# Patient Record
Sex: Female | Born: 1935 | Race: White | Hispanic: No | Marital: Married | Smoking: Former smoker
Health system: Southern US, Community
[De-identification: ages and names within clinical notes are randomized; demographics above are authoritative.]

## PROBLEM LIST (undated history)

## (undated) DIAGNOSIS — J449 Chronic obstructive pulmonary disease, unspecified: Secondary | ICD-10-CM

## (undated) DIAGNOSIS — R001 Bradycardia, unspecified: Secondary | ICD-10-CM

## (undated) DIAGNOSIS — I679 Cerebrovascular disease, unspecified: Secondary | ICD-10-CM

## (undated) DIAGNOSIS — I2510 Atherosclerotic heart disease of native coronary artery without angina pectoris: Secondary | ICD-10-CM

## (undated) DIAGNOSIS — G8929 Other chronic pain: Secondary | ICD-10-CM

## (undated) DIAGNOSIS — E7800 Pure hypercholesterolemia, unspecified: Secondary | ICD-10-CM

## (undated) DIAGNOSIS — I639 Cerebral infarction, unspecified: Secondary | ICD-10-CM

## (undated) DIAGNOSIS — R55 Syncope and collapse: Secondary | ICD-10-CM

## (undated) DIAGNOSIS — I10 Essential (primary) hypertension: Secondary | ICD-10-CM

## (undated) DIAGNOSIS — J45909 Unspecified asthma, uncomplicated: Secondary | ICD-10-CM

## (undated) DIAGNOSIS — K219 Gastro-esophageal reflux disease without esophagitis: Secondary | ICD-10-CM

## (undated) DIAGNOSIS — R51 Headache: Secondary | ICD-10-CM

## (undated) DIAGNOSIS — Z720 Tobacco use: Secondary | ICD-10-CM

## (undated) DIAGNOSIS — J189 Pneumonia, unspecified organism: Secondary | ICD-10-CM

## (undated) DIAGNOSIS — D649 Anemia, unspecified: Secondary | ICD-10-CM

## (undated) DIAGNOSIS — F0390 Unspecified dementia without behavioral disturbance: Secondary | ICD-10-CM

## (undated) DIAGNOSIS — M8580 Other specified disorders of bone density and structure, unspecified site: Secondary | ICD-10-CM

## (undated) DIAGNOSIS — K5790 Diverticulosis of intestine, part unspecified, without perforation or abscess without bleeding: Secondary | ICD-10-CM

## (undated) DIAGNOSIS — M1990 Unspecified osteoarthritis, unspecified site: Secondary | ICD-10-CM

## (undated) DIAGNOSIS — R42 Dizziness and giddiness: Secondary | ICD-10-CM

## (undated) DIAGNOSIS — I951 Orthostatic hypotension: Secondary | ICD-10-CM

## (undated) DIAGNOSIS — N390 Urinary tract infection, site not specified: Secondary | ICD-10-CM

## (undated) DIAGNOSIS — J42 Unspecified chronic bronchitis: Secondary | ICD-10-CM

## (undated) DIAGNOSIS — F99 Mental disorder, not otherwise specified: Secondary | ICD-10-CM

## (undated) DIAGNOSIS — I493 Ventricular premature depolarization: Secondary | ICD-10-CM

## (undated) DIAGNOSIS — M549 Dorsalgia, unspecified: Secondary | ICD-10-CM

## (undated) DIAGNOSIS — Z8489 Family history of other specified conditions: Secondary | ICD-10-CM

## (undated) HISTORY — DX: Cerebrovascular disease, unspecified: I67.9

## (undated) HISTORY — DX: Bradycardia, unspecified: R00.1

## (undated) HISTORY — PX: APPENDECTOMY: SHX54

## (undated) HISTORY — DX: Diverticulosis of intestine, part unspecified, without perforation or abscess without bleeding: K57.90

## (undated) HISTORY — PX: TONSILLECTOMY: SUR1361

## (undated) HISTORY — PX: VENTRAL HERNIA REPAIR: SHX424

## (undated) HISTORY — DX: Gastro-esophageal reflux disease without esophagitis: K21.9

## (undated) HISTORY — DX: Other specified disorders of bone density and structure, unspecified site: M85.80

## (undated) HISTORY — PX: CATARACT EXTRACTION, BILATERAL: SHX1313

## (undated) HISTORY — DX: Tobacco use: Z72.0

## (undated) HISTORY — DX: Anemia, unspecified: D64.9

## (undated) HISTORY — DX: Orthostatic hypotension: I95.1

## (undated) HISTORY — PX: HERNIA REPAIR: SHX51

## (undated) HISTORY — DX: Ventricular premature depolarization: I49.3

## (undated) HISTORY — DX: Syncope and collapse: R55

---

## 1988-01-08 HISTORY — PX: ABDOMINAL HYSTERECTOMY: SHX81

## 1999-01-22 ENCOUNTER — Other Ambulatory Visit: Admission: RE | Admit: 1999-01-22 | Discharge: 1999-01-22 | Payer: Self-pay | Admitting: Otolaryngology

## 2001-02-23 ENCOUNTER — Encounter: Payer: Self-pay | Admitting: Otolaryngology

## 2001-02-23 ENCOUNTER — Ambulatory Visit (HOSPITAL_COMMUNITY): Admission: RE | Admit: 2001-02-23 | Discharge: 2001-02-23 | Payer: Self-pay | Admitting: Otolaryngology

## 2002-02-08 ENCOUNTER — Ambulatory Visit (HOSPITAL_COMMUNITY): Admission: RE | Admit: 2002-02-08 | Discharge: 2002-02-08 | Payer: Self-pay | Admitting: Internal Medicine

## 2002-04-06 ENCOUNTER — Ambulatory Visit (HOSPITAL_COMMUNITY): Admission: RE | Admit: 2002-04-06 | Discharge: 2002-04-06 | Payer: Self-pay | Admitting: Family Medicine

## 2002-04-06 ENCOUNTER — Encounter: Payer: Self-pay | Admitting: Family Medicine

## 2002-04-07 ENCOUNTER — Encounter: Payer: Self-pay | Admitting: Family Medicine

## 2002-04-07 ENCOUNTER — Ambulatory Visit (HOSPITAL_COMMUNITY): Admission: RE | Admit: 2002-04-07 | Discharge: 2002-04-07 | Payer: Self-pay | Admitting: Family Medicine

## 2002-04-28 ENCOUNTER — Encounter (HOSPITAL_COMMUNITY): Admission: RE | Admit: 2002-04-28 | Discharge: 2002-05-28 | Payer: Self-pay | Admitting: Family Medicine

## 2002-10-25 ENCOUNTER — Encounter: Payer: Self-pay | Admitting: Family Medicine

## 2002-10-25 ENCOUNTER — Ambulatory Visit (HOSPITAL_COMMUNITY): Admission: RE | Admit: 2002-10-25 | Discharge: 2002-10-25 | Payer: Self-pay | Admitting: Family Medicine

## 2003-04-23 ENCOUNTER — Ambulatory Visit (HOSPITAL_COMMUNITY): Admission: RE | Admit: 2003-04-23 | Discharge: 2003-04-23 | Payer: Self-pay | Admitting: Family Medicine

## 2003-05-17 ENCOUNTER — Ambulatory Visit (HOSPITAL_COMMUNITY): Admission: RE | Admit: 2003-05-17 | Discharge: 2003-05-17 | Payer: Self-pay

## 2003-11-16 ENCOUNTER — Ambulatory Visit (HOSPITAL_COMMUNITY): Admission: RE | Admit: 2003-11-16 | Discharge: 2003-11-16 | Payer: Self-pay | Admitting: Otolaryngology

## 2003-12-28 ENCOUNTER — Ambulatory Visit (HOSPITAL_COMMUNITY): Admission: RE | Admit: 2003-12-28 | Discharge: 2003-12-28 | Payer: Self-pay | Admitting: Family Medicine

## 2003-12-28 ENCOUNTER — Encounter (HOSPITAL_COMMUNITY): Admission: RE | Admit: 2003-12-28 | Discharge: 2004-01-27 | Payer: Self-pay | Admitting: Dentistry

## 2004-04-23 ENCOUNTER — Ambulatory Visit: Payer: Self-pay | Admitting: Internal Medicine

## 2004-04-26 ENCOUNTER — Ambulatory Visit: Payer: Self-pay | Admitting: Internal Medicine

## 2004-06-12 ENCOUNTER — Ambulatory Visit: Payer: Self-pay | Admitting: Internal Medicine

## 2004-07-03 ENCOUNTER — Ambulatory Visit (HOSPITAL_COMMUNITY): Admission: RE | Admit: 2004-07-03 | Discharge: 2004-07-03 | Payer: Self-pay | Admitting: Obstetrics and Gynecology

## 2004-07-11 ENCOUNTER — Ambulatory Visit: Payer: Self-pay | Admitting: Internal Medicine

## 2004-07-17 ENCOUNTER — Ambulatory Visit (HOSPITAL_COMMUNITY): Admission: RE | Admit: 2004-07-17 | Discharge: 2004-07-17 | Payer: Self-pay | Admitting: Family Medicine

## 2004-09-12 ENCOUNTER — Ambulatory Visit (HOSPITAL_COMMUNITY): Admission: RE | Admit: 2004-09-12 | Discharge: 2004-09-12 | Payer: Self-pay | Admitting: Neurology

## 2005-01-07 HISTORY — PX: CORONARY ARTERY BYPASS GRAFT: SHX141

## 2005-01-07 HISTORY — PX: CARDIAC CATHETERIZATION: SHX172

## 2005-01-22 ENCOUNTER — Ambulatory Visit: Payer: Self-pay | Admitting: Internal Medicine

## 2005-02-12 ENCOUNTER — Ambulatory Visit: Payer: Self-pay | Admitting: Internal Medicine

## 2005-04-24 ENCOUNTER — Ambulatory Visit: Payer: Self-pay | Admitting: Internal Medicine

## 2005-05-31 ENCOUNTER — Encounter (INDEPENDENT_AMBULATORY_CARE_PROVIDER_SITE_OTHER): Payer: Self-pay | Admitting: *Deleted

## 2005-05-31 LAB — CONVERTED CEMR LAB
ALT: 11 units/L
Albumin: 4.1 g/dL
Glucose, Bld: 84 mg/dL
HDL: 53 mg/dL
Hemoglobin: 11.5 g/dL
MCV: 97.2 fL
Potassium: 4 meq/L
Sodium: 140 meq/L
Total Protein: 6.6 g/dL
WBC: 6 10*3/uL

## 2005-06-07 ENCOUNTER — Ambulatory Visit: Payer: Self-pay | Admitting: Cardiology

## 2005-06-13 ENCOUNTER — Ambulatory Visit: Payer: Self-pay | Admitting: *Deleted

## 2005-06-13 ENCOUNTER — Encounter (HOSPITAL_COMMUNITY): Admission: RE | Admit: 2005-06-13 | Discharge: 2005-07-13 | Payer: Self-pay | Admitting: *Deleted

## 2005-06-18 ENCOUNTER — Ambulatory Visit (HOSPITAL_COMMUNITY): Admission: RE | Admit: 2005-06-18 | Discharge: 2005-06-18 | Payer: Self-pay | Admitting: *Deleted

## 2005-06-18 ENCOUNTER — Ambulatory Visit: Payer: Self-pay | Admitting: *Deleted

## 2005-06-21 ENCOUNTER — Inpatient Hospital Stay (HOSPITAL_COMMUNITY): Admission: AD | Admit: 2005-06-21 | Discharge: 2005-07-09 | Payer: Self-pay | Admitting: *Deleted

## 2005-06-21 ENCOUNTER — Ambulatory Visit: Payer: Self-pay | Admitting: *Deleted

## 2005-06-21 ENCOUNTER — Inpatient Hospital Stay (HOSPITAL_BASED_OUTPATIENT_CLINIC_OR_DEPARTMENT_OTHER): Admission: RE | Admit: 2005-06-21 | Discharge: 2005-06-21 | Payer: Self-pay | Admitting: *Deleted

## 2005-06-24 ENCOUNTER — Encounter: Payer: Self-pay | Admitting: Vascular Surgery

## 2005-06-24 ENCOUNTER — Encounter: Payer: Self-pay | Admitting: Internal Medicine

## 2005-07-03 ENCOUNTER — Encounter: Payer: Self-pay | Admitting: Internal Medicine

## 2005-07-04 ENCOUNTER — Ambulatory Visit: Payer: Self-pay | Admitting: Physical Medicine & Rehabilitation

## 2005-07-05 ENCOUNTER — Ambulatory Visit: Payer: Self-pay | Admitting: Gastroenterology

## 2005-07-05 ENCOUNTER — Ambulatory Visit: Payer: Self-pay | Admitting: Thoracic Surgery (Cardiothoracic Vascular Surgery)

## 2005-07-09 ENCOUNTER — Inpatient Hospital Stay: Admission: AD | Admit: 2005-07-09 | Discharge: 2005-07-11 | Payer: Self-pay | Admitting: Internal Medicine

## 2005-07-11 ENCOUNTER — Ambulatory Visit: Payer: Self-pay | Admitting: Internal Medicine

## 2005-07-12 ENCOUNTER — Ambulatory Visit: Payer: Self-pay | Admitting: Internal Medicine

## 2005-07-15 ENCOUNTER — Ambulatory Visit (HOSPITAL_COMMUNITY): Admission: RE | Admit: 2005-07-15 | Discharge: 2005-07-15 | Payer: Self-pay | Admitting: Internal Medicine

## 2005-07-15 ENCOUNTER — Ambulatory Visit: Payer: Self-pay | Admitting: Internal Medicine

## 2005-07-18 ENCOUNTER — Ambulatory Visit: Payer: Self-pay | Admitting: Internal Medicine

## 2005-07-18 ENCOUNTER — Ambulatory Visit (HOSPITAL_COMMUNITY): Admission: RE | Admit: 2005-07-18 | Discharge: 2005-07-18 | Payer: Self-pay | Admitting: Internal Medicine

## 2005-08-06 ENCOUNTER — Encounter (INDEPENDENT_AMBULATORY_CARE_PROVIDER_SITE_OTHER): Payer: Self-pay | Admitting: Internal Medicine

## 2005-08-08 ENCOUNTER — Encounter (HOSPITAL_COMMUNITY): Admission: RE | Admit: 2005-08-08 | Discharge: 2005-09-07 | Payer: Self-pay | Admitting: *Deleted

## 2005-09-02 ENCOUNTER — Ambulatory Visit (HOSPITAL_COMMUNITY): Admission: RE | Admit: 2005-09-02 | Discharge: 2005-09-02 | Payer: Self-pay | Admitting: *Deleted

## 2005-09-02 ENCOUNTER — Ambulatory Visit: Payer: Self-pay | Admitting: *Deleted

## 2005-09-11 ENCOUNTER — Encounter (HOSPITAL_COMMUNITY): Admission: RE | Admit: 2005-09-11 | Discharge: 2005-10-05 | Payer: Self-pay | Admitting: *Deleted

## 2005-10-07 ENCOUNTER — Encounter (HOSPITAL_COMMUNITY): Admission: RE | Admit: 2005-10-07 | Discharge: 2005-11-06 | Payer: Self-pay | Admitting: *Deleted

## 2005-11-08 ENCOUNTER — Ambulatory Visit: Payer: Self-pay | Admitting: Cardiovascular Disease

## 2005-11-08 ENCOUNTER — Encounter (HOSPITAL_COMMUNITY): Admission: RE | Admit: 2005-11-08 | Discharge: 2005-12-08 | Payer: Self-pay

## 2005-12-19 ENCOUNTER — Ambulatory Visit: Payer: Self-pay | Admitting: Internal Medicine

## 2005-12-26 ENCOUNTER — Ambulatory Visit (HOSPITAL_COMMUNITY): Admission: RE | Admit: 2005-12-26 | Discharge: 2005-12-26 | Payer: Self-pay | Admitting: Family Medicine

## 2006-01-06 ENCOUNTER — Ambulatory Visit: Payer: Self-pay | Admitting: Internal Medicine

## 2006-02-13 ENCOUNTER — Ambulatory Visit: Payer: Self-pay | Admitting: Internal Medicine

## 2006-02-18 ENCOUNTER — Ambulatory Visit (HOSPITAL_COMMUNITY): Admission: RE | Admit: 2006-02-18 | Discharge: 2006-02-18 | Payer: Self-pay | Admitting: Family Medicine

## 2006-03-13 ENCOUNTER — Ambulatory Visit: Payer: Self-pay | Admitting: Internal Medicine

## 2006-04-14 ENCOUNTER — Ambulatory Visit: Payer: Self-pay | Admitting: Internal Medicine

## 2006-05-13 ENCOUNTER — Emergency Department (HOSPITAL_COMMUNITY): Admission: EM | Admit: 2006-05-13 | Discharge: 2006-05-13 | Payer: Self-pay | Admitting: Emergency Medicine

## 2006-12-30 ENCOUNTER — Emergency Department (HOSPITAL_COMMUNITY): Admission: EM | Admit: 2006-12-30 | Discharge: 2006-12-30 | Payer: Self-pay | Admitting: Emergency Medicine

## 2007-01-06 ENCOUNTER — Emergency Department (HOSPITAL_COMMUNITY): Admission: EM | Admit: 2007-01-06 | Discharge: 2007-01-06 | Payer: Self-pay | Admitting: Emergency Medicine

## 2007-01-15 ENCOUNTER — Ambulatory Visit (HOSPITAL_COMMUNITY): Admission: RE | Admit: 2007-01-15 | Discharge: 2007-01-15 | Payer: Self-pay | Admitting: Family Medicine

## 2008-08-29 ENCOUNTER — Ambulatory Visit: Payer: Self-pay | Admitting: Cardiology

## 2008-08-29 ENCOUNTER — Observation Stay (HOSPITAL_COMMUNITY): Admission: EM | Admit: 2008-08-29 | Discharge: 2008-08-31 | Payer: Self-pay | Admitting: Emergency Medicine

## 2008-08-30 ENCOUNTER — Encounter (INDEPENDENT_AMBULATORY_CARE_PROVIDER_SITE_OTHER): Payer: Self-pay | Admitting: Internal Medicine

## 2008-08-30 ENCOUNTER — Encounter: Payer: Self-pay | Admitting: Cardiology

## 2008-08-31 ENCOUNTER — Encounter: Payer: Self-pay | Admitting: Cardiology

## 2008-09-02 ENCOUNTER — Telehealth: Payer: Self-pay | Admitting: Cardiology

## 2008-09-15 ENCOUNTER — Encounter: Payer: Self-pay | Admitting: Cardiology

## 2008-09-19 ENCOUNTER — Encounter (INDEPENDENT_AMBULATORY_CARE_PROVIDER_SITE_OTHER): Payer: Self-pay | Admitting: *Deleted

## 2008-09-19 DIAGNOSIS — D649 Anemia, unspecified: Secondary | ICD-10-CM | POA: Insufficient documentation

## 2008-09-19 DIAGNOSIS — M8580 Other specified disorders of bone density and structure, unspecified site: Secondary | ICD-10-CM

## 2008-09-19 DIAGNOSIS — K219 Gastro-esophageal reflux disease without esophagitis: Secondary | ICD-10-CM

## 2008-09-20 ENCOUNTER — Ambulatory Visit: Payer: Self-pay | Admitting: Cardiology

## 2008-10-11 ENCOUNTER — Telehealth (INDEPENDENT_AMBULATORY_CARE_PROVIDER_SITE_OTHER): Payer: Self-pay | Admitting: *Deleted

## 2008-10-17 ENCOUNTER — Ambulatory Visit: Payer: Self-pay | Admitting: Cardiology

## 2008-10-27 ENCOUNTER — Encounter: Payer: Self-pay | Admitting: Cardiology

## 2008-12-06 ENCOUNTER — Encounter: Payer: Self-pay | Admitting: Cardiology

## 2009-05-05 ENCOUNTER — Encounter (INDEPENDENT_AMBULATORY_CARE_PROVIDER_SITE_OTHER): Payer: Self-pay | Admitting: *Deleted

## 2009-05-07 DIAGNOSIS — K5730 Diverticulosis of large intestine without perforation or abscess without bleeding: Secondary | ICD-10-CM | POA: Insufficient documentation

## 2009-05-07 DIAGNOSIS — I951 Orthostatic hypotension: Secondary | ICD-10-CM | POA: Insufficient documentation

## 2009-05-07 DIAGNOSIS — E785 Hyperlipidemia, unspecified: Secondary | ICD-10-CM

## 2009-05-07 DIAGNOSIS — Z720 Tobacco use: Secondary | ICD-10-CM | POA: Insufficient documentation

## 2009-05-08 ENCOUNTER — Ambulatory Visit: Payer: Self-pay | Admitting: Cardiology

## 2009-05-08 ENCOUNTER — Encounter (INDEPENDENT_AMBULATORY_CARE_PROVIDER_SITE_OTHER): Payer: Self-pay | Admitting: *Deleted

## 2009-05-15 ENCOUNTER — Encounter: Payer: Self-pay | Admitting: Cardiology

## 2009-05-15 LAB — CONVERTED CEMR LAB
Albumin: 4.2 g/dL (ref 3.5–5.2)
BUN: 11 mg/dL (ref 6–23)
CO2: 25 meq/L (ref 19–32)
Chloride: 106 meq/L (ref 96–112)
Creatinine, Ser: 0.74 mg/dL (ref 0.40–1.20)
Glucose, Bld: 77 mg/dL (ref 70–99)
HDL: 53 mg/dL (ref >39)
Potassium: 4.9 meq/L (ref 3.5–5.3)
Total Bilirubin: 0.5 mg/dL (ref 0.3–1.2)
Total Protein: 6 g/dL (ref 6.0–8.3)
Triglycerides: 62 mg/dL (ref <150)
VLDL: 12 mg/dL (ref 0–40)

## 2009-06-23 ENCOUNTER — Encounter (INDEPENDENT_AMBULATORY_CARE_PROVIDER_SITE_OTHER): Payer: Self-pay | Admitting: *Deleted

## 2010-01-28 ENCOUNTER — Encounter: Payer: Self-pay | Admitting: Family Medicine

## 2010-01-28 ENCOUNTER — Encounter: Payer: Self-pay | Admitting: Cardiology

## 2010-02-08 NOTE — Letter (Signed)
Summary: [place] Results Engineer, agricultural at [facility]  [address] [place] [address]   Phone: [phone]  Fax: [phone]      June 23, 2009 MRN: [id number]   [facility] [address] [place], [place]  [address]   Dear Ms. Adela Lank,  Your test ordered by Selena Batten has been reviewed by your physician (or physician assistant) and was found to be normal or stable. Your physician (or physician assistant) felt no changes were needed at this time.  ____ Echocardiogram  ____ Cardiac Stress Test  __x__ Lab Work  ____ Peripheral vascular study of arms, legs or neck  ____ CT scan or X-ray  ____ Lung or Breathing test  ____ Other:  No change in medical treatment at this time, per Dr. Dietrich Pates.  Enclosed is a copy of your labwork for your records.  Thank you, Arlander Gillen Allyne Gee RN    [place] Bing, MD, Lenise Arena.C.Gaylord Shih, MD, F.A.C.C Lewayne Bunting, MD, F.A.C.C Nona Dell, MD, F.A.C.C Charlton Haws, MD, Lenise Arena.C.C

## 2010-02-08 NOTE — Letter (Signed)
Summary: [place] Future Lab Work Engineer, agricultural at [employer]  [address], [place] [address]   Phone: [phone]  Fax: [phone]     May 08, 2009 MRN: [id number]   [facility] [address] [place], [place]  [address]      YOUR LAB WORK IS DUE   ___________MONDAY   MAY 6, 2011______________________________  Please go to [facility], located across the street from [facility] on the second floor.  Hours are Monday - Friday 7am until 7:30pm         Saturday 8am until 12noon    _X_  DO NOT EAT OR DRINK AFTER MIDNIGHT EVENING PRIOR TO LABWORK  __ YOUR LABWORK IS NOT FASTING --YOU MAY EAT PRIOR TO LABWORK

## 2010-02-08 NOTE — Assessment & Plan Note (Signed)
Summary: 6 MTH F/U PER CHECKOUT ON 09/20/08/TG   Visit Type:  Follow-up Primary Provider:  Dr.Mark Nobie Putnam  CC:  .Marland Kitchen  History of Present Illness: Return visit as scheduled for this very pleasant octogenarian with coronary artery disease, now 4 years following CABG surgery for left main disease.  Since a hospitalization last year for chest discomfort, she has been free of symptoms.  She is active for her age, and has no exercise-related chest discomfort or dyspnea.  She has had no recent health issues.  She does not recall her last lipid profile; the most recent test available to me is from 2008.  Preventive Screening-Counseling & Management  Alcohol-Tobacco     Smoking Status: quit     Year Quit: 2005  Current Medications (verified): 1)  Caltrate 600+d Plus 600-400 Mg-Unit Tabs (Calcium Carbonate-Vit D-Min) .... Take 1 Tablet By Mouth Two Times A Day 2)  Vitamin C Cr 500 Mg Cr-Caps (Ascorbic Acid) .... Take 1 Tab Daily 3)  Aspir-Low 81 Mg Tbec (Aspirin) .... Take 1 Tab Daily 4)  Vitamin B-12 250 Mcg Tabs (Cyanocobalamin) .... Take 1 Tablet By Mouth Once A Day 5)  Ginkoba 40 Mg Tabs (Ginkgo Biloba) .... Take 1 Tab Daily 6)  Lipitor 10 Mg Tabs (Atorvastatin Calcium) .... Take 1 Tab Daily 7)  Fish Oil 1000 Mg Caps (Omega-3 Fatty Acids) .... Take 1 Cap Daily 8)  Daily Multiple Vitamins  Tabs (Multiple Vitamin) .... Take 1 Tab Daily 9)  Boniva 150 Mg Tabs (Ibandronate Sodium) .... Take 1 Tab Monthly 10)  Nitrostat 0.4 Mg Subl (Nitroglycerin) .... Take As Directed For Chest Pain 11)  Lorazepam 1 Mg Tabs (Lorazepam) .... Take 1/2-1 Tablet By Mouth Three Times A Day As Needed 12)  Mucinex Dm 30-600 Mg Xr12h-Tab (Dextromethorphan-Guaifenesin) .... Take 1 Tablet By Mouth Two Times A Day As Needed 13)  Tylenol Extra Strength 500 Mg Tabs (Acetaminophen) .... As Needed 14)  Meclizine Hcl 25 Mg Tabs (Meclizine Hcl) .... Take 1 Tablet By Mouth Three Times A Day As Needed 15)  Xopenex 0.63 Mg/57ml Nebu  (Levalbuterol Hcl) .... One Neb Tx As Needed  Allergies (verified): 1)  ! Penicillin 2)  ! * Sulfar 3)  ! Keflex  Past History:  PMH, FH, and Social History reviewed and updated.  Family History: Father:deceased; cause was trauma due to motor vehicle accident Mother:deceased; cardiac problems Siblings:3, all deceased; causes included pneumonia, cardiac problems and asthma.  Social History: Smoking Status:  quit  Review of Systems       The patient complains of weight loss.  The patient denies anorexia, vision loss, decreased hearing, hoarseness, chest pain, syncope, dyspnea on exertion, peripheral edema, prolonged cough, headaches, hemoptysis, abdominal pain, melena, and hematochezia.         Weight loss was intentional; she now feels that she is at an appropriate weight.  Vital Signs:  Patient profile:   77 year old female Height:      64 inches Weight:      105 pounds Pulse rate:   51 / minute BP sitting:   112 / 61  (left arm) Cuff size:   regular  Vitals Entered By: Carlye Grippe (May 08, 2009 1:15 PM) CC: .   Physical Exam  General:     Trim; well developed; no acute distress:   Neck-No JVD; faint left carotid bruits: Lungs-No tachypnea, no rales; no rhonchi; no wheezes: Cardiovascular-normal PMI; normal S1 and S2; grade 2/6 systolic murmur at the  left sternal border and apex Abdomen-BS normal; soft and non-tender without masses or organomegaly:  Musculoskeletal-No deformities, no cyanosis or clubbing: Neurologic-Normal cranial nerves; symmetric strength and tone:  Skin-Warm, no significant lesions: Extremities-Nl distal pulses; no edema:     Impression & Recommendations:  Problem # 1:  CAROTID BRUIT, LEFT (ICD-785.9) No reports of prior carotid ultrasound studies are available to me.  Accordingly, this study will be obtained to evaluate her bruit in the setting of known vascular disease.  Problem # 2:  ATHEROSCLEROTIC CARDIOVASCULAR DISEASE  (ICD-429.2) In the absence of symptoms, coronary disease appears stable.  Problem # 3:  HYPERTENSION, BENIGN (ICD-401.1) Blood pressure control is excellent.  Current medications will be continued.  Problem # 4:  HYPERLIPIDEMIA (ICD-272.4) A lipid profile and chemistry profile will be obtained.  I will reassess this nice woman in one year.  Other Orders: Carotid Duplex (Carotid Duplex) Future Orders: T-Lipid Profile ([id number]) ... 05/15/2009 T-Comprehensive Metabolic Panel [phone] [phone]) ... 05/15/2009  Patient Instructions: 1)  Your physician recommends that you schedule a follow-up appointment in: 1 year 2)  Your physician recommends that you return for lab work in: next week 3)  Your physician has requested that you have a carotid duplex. This test is an ultrasound of the carotid arteries in your neck. It looks at blood flow through these arteries that supply the brain with blood. Allow one hour for this exam. There are no restrictions or special instructions.

## 2010-02-08 NOTE — Miscellaneous (Signed)
Summary: LABS CBCD,CMP,LIPIDS,T4,TSH,05/31/2005  Clinical Lists Changes  Observations: Added new observation of CALCIUM: 9.4 mg/dL (16/10/9602 54:09) Added new observation of ALBUMIN: 4.1 g/dL (81/19/1478 29:56) Added new observation of PROTEIN, TOT: 6.6 g/dL (21/30/8657 84:69) Added new observation of SGPT (ALT): 11 units/L (05/31/2005 16:58) Added new observation of SGOT (AST): 14 units/L (05/31/2005 16:58) Added new observation of ALK PHOS: 53 units/L (05/31/2005 16:58) Added new observation of CREATININE: 0.9 mg/dL (62/95/2841 32:44) Added new observation of BUN: 10 mg/dL (01/09/7251 66:44) Added new observation of BG RANDOM: 84 mg/dL (03/47/4259 56:38) Added new observation of CO2 PLSM/SER: 25 meq/L (05/31/2005 16:58) Added new observation of CL SERUM: 106 meq/L (05/31/2005 16:58) Added new observation of K SERUM: 4.0 meq/L (05/31/2005 16:58) Added new observation of NA: 140 meq/L (05/31/2005 16:58) Added new observation of LDL: 116 mg/dL (75/64/3329 51:88) Added new observation of HDL: 53 mg/dL (41/66/0630 16:01) Added new observation of TRIGLYC TOT: 110 mg/dL (09/32/3557 32:20) Added new observation of CHOLESTEROL: 191 mg/dL (25/42/7062 37:62) Added new observation of PLATELETK/UL: 271 K/uL (05/31/2005 16:58) Added new observation of MCV: 97.2 fL (05/31/2005 16:58) Added new observation of HCT: 34.4 % (05/31/2005 16:58) Added new observation of HGB: 11.5 g/dL (83/15/1761 60:73) Added new observation of WBC COUNT: 6.0 10*3/microliter (05/31/2005 16:58) Added new observation of TSH: 2.884 microintl units/mL (05/31/2005 16:58) Added new observation of T4, FREE: 4.8 ng/dL (71/06/2692 85:46)

## 2010-04-14 LAB — BLOOD GAS, ARTERIAL
Bicarbonate: 23.4 mEq/L (ref 20.0–24.0)
O2 Saturation: 96.7 %
Patient temperature: 37
TCO2: 20.2 mmol/L (ref 0–100)

## 2010-04-14 LAB — POCT CARDIAC MARKERS
CKMB, poc: <1 ng/mL — ABNORMAL LOW (ref 1.0–8.0)
Myoglobin, poc: 29.4 ng/mL (ref 12–200)
Troponin i, poc: <0.05 ng/mL (ref 0.00–0.09)

## 2010-04-14 LAB — LIPID PANEL
Cholesterol: 133 mg/dL (ref 0–200)
HDL: 58 mg/dL (ref >39)
LDL Cholesterol: 66 mg/dL (ref 0–99)
Total CHOL/HDL Ratio: 2.3 RATIO
VLDL: 9 mg/dL (ref 0–40)

## 2010-04-14 LAB — BASIC METABOLIC PANEL
BUN: 8 mg/dL (ref 6–23)
CO2: 31 mEq/L (ref 19–32)
Calcium: 9 mg/dL (ref 8.4–10.5)
Chloride: 103 mEq/L (ref 96–112)
Chloride: 107 mEq/L (ref 96–112)
Creatinine, Ser: 0.54 mg/dL (ref 0.4–1.2)
GFR calc Af Amer: >60 mL/min (ref >60)
Glucose, Bld: 90 mg/dL (ref 70–99)
Glucose, Bld: 93 mg/dL (ref 70–99)
Potassium: 3.7 mEq/L (ref 3.5–5.1)
Sodium: 139 mEq/L (ref 135–145)

## 2010-04-14 LAB — CBC
Hemoglobin: 13.6 g/dL (ref 12.0–15.0)
MCHC: 34.4 g/dL (ref 30.0–36.0)
MCHC: 34.6 g/dL (ref 30.0–36.0)
MCV: 96.1 fL (ref 78.0–100.0)
Platelets: 210 10*3/uL (ref 150–400)
RBC: 4.11 MIL/uL (ref 3.87–5.11)
WBC: 9.1 10*3/uL (ref 4.0–10.5)
WBC: 9.3 10*3/uL (ref 4.0–10.5)

## 2010-04-14 LAB — DIFFERENTIAL
Eosinophils Absolute: 0.2 10*3/uL (ref 0.0–0.7)
Eosinophils Absolute: 0.2 10*3/uL (ref 0.0–0.7)
Eosinophils Relative: 2 % (ref 0–5)
Lymphocytes Relative: 14 % (ref 12–46)
Lymphs Abs: 1.6 10*3/uL (ref 0.7–4.0)
Monocytes Absolute: 0.6 10*3/uL (ref 0.1–1.0)
Monocytes Relative: 6 % (ref 3–12)
Neutro Abs: 6.9 10*3/uL (ref 1.7–7.7)
Neutrophils Relative %: 74 % (ref 43–77)
Neutrophils Relative %: 76 % (ref 43–77)

## 2010-04-14 LAB — CARDIAC PANEL(CRET KIN+CKTOT+MB+TROPI)
Relative Index: INVALID (ref 0.0–2.5)
Relative Index: INVALID (ref 0.0–2.5)

## 2010-04-14 LAB — BRAIN NATRIURETIC PEPTIDE: Pro B Natriuretic peptide (BNP): 139 pg/mL — ABNORMAL HIGH (ref 0.0–100.0)

## 2010-04-14 LAB — PROTIME-INR: Prothrombin Time: 13.8 seconds (ref 11.6–15.2)

## 2010-05-10 ENCOUNTER — Other Ambulatory Visit: Payer: Self-pay | Admitting: Cardiology

## 2010-05-10 ENCOUNTER — Encounter: Payer: Self-pay | Admitting: Cardiology

## 2010-05-10 ENCOUNTER — Ambulatory Visit (INDEPENDENT_AMBULATORY_CARE_PROVIDER_SITE_OTHER): Payer: MEDICARE | Admitting: Cardiology

## 2010-05-10 DIAGNOSIS — E785 Hyperlipidemia, unspecified: Secondary | ICD-10-CM

## 2010-05-10 DIAGNOSIS — R55 Syncope and collapse: Secondary | ICD-10-CM

## 2010-05-10 DIAGNOSIS — I679 Cerebrovascular disease, unspecified: Secondary | ICD-10-CM | POA: Insufficient documentation

## 2010-05-10 DIAGNOSIS — F17200 Nicotine dependence, unspecified, uncomplicated: Secondary | ICD-10-CM

## 2010-05-10 DIAGNOSIS — I951 Orthostatic hypotension: Secondary | ICD-10-CM

## 2010-05-10 DIAGNOSIS — I2510 Atherosclerotic heart disease of native coronary artery without angina pectoris: Secondary | ICD-10-CM

## 2010-05-10 LAB — COMPREHENSIVE METABOLIC PANEL
BUN: 14 mg/dL (ref 6–23)
CO2: 27 mEq/L (ref 19–32)
Calcium: 10.3 mg/dL (ref 8.4–10.5)
Chloride: 103 mEq/L (ref 96–112)
Creat: 0.85 mg/dL (ref 0.40–1.20)
Glucose, Bld: 86 mg/dL (ref 70–99)

## 2010-05-10 LAB — CBC
HCT: 45.8 % (ref 36.0–46.0)
Hemoglobin: 14.8 g/dL (ref 12.0–15.0)
RBC: 4.79 MIL/uL (ref 3.87–5.11)
RDW: 13.4 % (ref 11.5–15.5)
WBC: 6.7 10*3/uL (ref 4.0–10.5)

## 2010-05-10 NOTE — Patient Instructions (Signed)
Your physician recommends that you schedule a follow-up appointment in: 1 YEAR Your physician recommends that you return for lab work [id number] Your physician has requested that you regularly monitor and record your blood pressure readings at home. Please use the same machine at the same time of day to check your readings and record them to bring to your follow-up visit. HOME BLOOD PRESSURES LYING A FEW TIMES A WEEK CALL FOR VALUES >170

## 2010-05-10 NOTE — Assessment & Plan Note (Signed)
Multiple falls, at least one with significant facial injury; evaluated by Dr. Gerilyn Pilgrim and started on midodrine with improvement.  Although lying blood pressure is slightly higher than optimal at present, control of her orthostatic hypotension appears to be generally good.  Patient was cautioned about arising suddenly more about walking until she is sure that she will not become dizzy and about the need to immediately sit down or lie down when she does.  Family will monitor her supine blood pressure and call for any marked elevations.

## 2010-05-10 NOTE — Assessment & Plan Note (Signed)
Lipid profile last year was excellent.  A repeat study will be performed.

## 2010-05-10 NOTE — Assessment & Plan Note (Addendum)
Patient is 4 years out from CABG surgery with no evidence for ischemia at present.  Complete cessation of tobacco use would be desirable, but considering the patient's advanced age, minimal smoking may not be a significant issue.

## 2010-05-10 NOTE — Progress Notes (Signed)
HPI : [name] returns to the office as scheduled for continued assessment and treatment of coronary disease and cardiovascular risk factors.  Since her last visit, she developed lightheadedness and multiple falls, at least some of which appear to have represented true syncope.  She was evaluated by Dr. Gerilyn Pilgrim, who is treating her for orthostatic hypotension.  Since starting midodrine, symptoms are substantially improved, and she has had no recurrent falls.  She remains active with class II dyspnea on exertion and no chest discomfort.  Patient's family reports that she is smoking cigarettes on the sly.  Current Outpatient Prescriptions on File Prior to Visit  Medication Sig Dispense Refill  . acetaminophen (TYLENOL) 325 MG tablet Take 650 mg by mouth every 6 (six) hours as needed.        Marland Kitchen aspirin 81 MG tablet Take 81 mg by mouth daily.        Marland Kitchen atorvastatin (LIPITOR) 10 MG tablet Take 10 mg by mouth daily.        . Calcium-Vitamin D (CALTRATE 600 PLUS-VIT D PO) Take 1 tablet by mouth daily.        Marland Kitchen dextromethorphan-guaiFENesin (MUCINEX DM) 30-600 MG per 12 hr tablet Take 1 tablet by mouth every 12 (twelve) hours.        . Multiple Vitamins-Minerals (MULTIVITAMIN WITH MINERALS) tablet Take 1 tablet by mouth daily.        . Omega-3 Fatty Acids (FISH OIL) 1000 MG CAPS Take 2 capsules by mouth daily.       . vitamin B-12 (CYANOCOBALAMIN) 250 MCG tablet Take 250 mcg by mouth daily.        . nitroGLYCERIN (NITROSTAT) 0.4 MG SL tablet Place 0.4 mg under the tongue every 5 (five) minutes as needed.        Marland Kitchen DISCONTD: ascorbic Acid (VITAMIN C) 500 MG CPCR Take 500 mg by mouth daily.        Marland Kitchen DISCONTD: Ginkgo Biloba (GINKOBA) 40 MG TABS Take 1 tablet by mouth daily.        Marland Kitchen DISCONTD: ibandronate (BONIVA) 150 MG tablet Take 150 mg by mouth every 30 (thirty) days. Take in the morning with a full glass of water, on an empty stomach, and do not take anything else by mouth or lie down for the next 30 min.        Marland Kitchen DISCONTD: levalbuterol (XOPENEX) 0.63 MG/3ML nebulizer solution Take 1 ampule by nebulization every 4 (four) hours as needed.        Marland Kitchen DISCONTD: LORazepam (ATIVAN) 1 MG tablet Take 1 mg by mouth every 8 (eight) hours.        Marland Kitchen DISCONTD: meclizine (ANTIVERT) 25 MG tablet Take 25 mg by mouth 3 (three) times daily as needed.           Allergies  Allergen Reactions  . Cephalexin   . Iohexol     Urticaria, emesis, numbness during IVP performed at [facility] years ago  . Penicillins       Past medical history, social history, and family history reviewed and updated.  ROS: See history of present illness.  PHYSICAL EXAM: BP 136/91  Pulse 78  Wt 105 lb (47.628 kg)  SpO2 94%  General-Well developed; no acute distress Body habitus-proportionate weight and height Neck-No JVD; no carotid bruits Lungs-clear lung fields except for a few right basilar rales; resonant to percussion Cardiovascular-normal PMI; normal S1 and S2; prominent fourth heart sound Abdomen-normal bowel sounds; soft and non-tender without masses  or organomegaly Musculoskeletal-No deformities, no cyanosis or clubbing Neurologic-Normal cranial nerves; symmetric strength and tone Skin-Warm, no significant lesions Extremities-distal pulses intact with decreased dorsalis pedis pulses but normal posterior tibials; no edema  ASSESSMENT AND PLAN:

## 2010-05-10 NOTE — Assessment & Plan Note (Signed)
Patient failed to keep her appointment for carotid ultrasound last year.  She agrees to undergo this test in the near future.

## 2010-05-11 LAB — LIPID PANEL
HDL: 69 mg/dL (ref >39)
LDL Cholesterol: 86 mg/dL (ref 0–99)
Total CHOL/HDL Ratio: 2.5 Ratio
Triglycerides: 74 mg/dL (ref <150)
VLDL: 15 mg/dL (ref 0–40)

## 2010-05-14 ENCOUNTER — Encounter: Payer: Self-pay | Admitting: Cardiology

## 2010-05-15 ENCOUNTER — Encounter: Payer: Self-pay | Admitting: *Deleted

## 2010-05-15 ENCOUNTER — Telehealth: Payer: Self-pay | Admitting: *Deleted

## 2010-05-15 DIAGNOSIS — E875 Hyperkalemia: Secondary | ICD-10-CM

## 2010-05-15 NOTE — Progress Notes (Signed)
Information on a low potassium diet and labwork  Scheduled for 06/15/10

## 2010-05-15 NOTE — Telephone Encounter (Signed)
Message copied by Teressa Lower on Tue May 15, 2010  1:29 PM ------      Message from: [place] Bing      Created: Sun May 13, 2010 11:10 AM       Results reviewed and borderline hyperkalemia noted.  Patient is to be instructed in a low potassium diet and a basic metabolic profile to be obtained in one month.

## 2010-05-15 NOTE — Telephone Encounter (Signed)
Result letter mailed to pt with instructions.

## 2010-05-22 NOTE — Consult Note (Signed)
NAMEJAYLEE, [name]                ACCOUNT NO.:  [id number]   MEDICAL RECORD NO.:  [id number]          PATIENT TYPE:  OBV   LOCATION:  A332                          FACILITY:  [facility]   PHYSICIAN:  Kim Friends. Dietrich Pates, MD, FACCDATE OF BIRTH:  1925/05/13   DATE OF CONSULTATION:  08/30/2008  DATE OF DISCHARGE:                                 CONSULTATION   REFERRING:  [name]. Kim Ridgel, MD   PRIMARY CARE PHYSICIAN:  Kim Duel, MD   PRIMARY CARDIOLOGISTS:  [name]. Kim Emms, MD, [name] and Kim Friends.  Dietrich Pates, MD, [name]   HISTORY OF PRESENT ILLNESS:  An 77 year old woman who underwent CABG  surgery 3 years ago and now was admitted to hospital with chest  discomfort.  [name] was found to have left main disease in June 2007,  prompting two-vessel CABG surgery, which was uncomplicated.  She was  said to have moderate mitral and tricuspid regurgitation, but no valve  repair procedures were carried out.  She has a longstanding history of  cigarette smoking with mild-to-moderate chronic lung disease.  She also  has hyperlipidemia and hypertension.  Unfortunately, she has continued  to smoke cigarettes, but now indicates that she is willing to try  quitting.   The current episode began yesterday after the patient smoked a  cigarette.  She reported chest discomfort, which she now describes quite  vaguely.  This was of moderate intensity.  There was associated dyspnea,  but no diaphoresis nor nausea.  Symptoms resolved spontaneously by the  time she reached the emergency department.   PAST MEDICAL HISTORY:  Otherwise notable for neurologic events  characterized as either CVA or TIA in 1993.  She has seasonal allergies  and diverticular disease without a history of acute diverticulitis.   PAST SURGICAL HISTORY:  In addition to her bypass procedure include  bilateral cataract extraction, appendectomy, cholecystectomy, repair of  a ventral hernia, and hysterectomy.   ALLERGIES:  She reports  allergies to ADHESIVE TAPE, PENICILLIN,  INTRAVENOUS CONTRAST, SULFA DRUGS, CEPHALOSPORINS, and OXYCODONE.   She was most recently seen in Cardiology Clinic in 2008 for evaluation  of possible syncopal spells.  No specific etiology was identified.  She  had been seen a year earlier for recurrent chest discomfort that was  thought to be noncardiac.  No workup was undertaken.  It appears that  she has not had a stress test and certainly has not had coronary  angiography since her bypass surgery was performed.   SOCIAL HISTORY:  A 60-pack-year history of tobacco use that continues;  no excessive use of alcohol.  She is married and lives locally with her  spouse.   FAMILY HISTORY:  Mother died due to cardiac disease; father died due to  trauma at a young age.   REVIEW OF SYSTEMS:  She eats a regular diet at home.  She has impaired  vision and requires corrective lenses for near and far.  She has a  history of depression and insomnia.  She has chronic nasal congestion.  She has class II-III dyspnea on exertion, but is able to  do her  housework without much difficulty.  All other systems reviewed and/or  negative.   PHYSICAL EXAMINATION:  GENERAL:  Somewhat unemotional, very thin woman  in no acute distress.  VITAL SIGNS:  The weight is 45 kg, 30 pounds less than in 2007.  Temperature 97.6, heart rate 70 and regular, blood pressure 155/95,  respirations 16 and unlabored, O2 saturation 99% on 2 L.  HEENT:  Anicteric sclerae; EOMs full; normal lids and conjunctivae;  normal oral mucosa.  NECK:  No jugular venous distention; normal carotid upstrokes with  bilateral bruits versus transmitted murmur.  ENDOCRINE:  No thyromegaly.  HEMATOPOIETIC:  No adenopathy.  SKIN:  Multiple ecchymoses over the hands and arms.  LUNGS:  Clear without any marked prolongation of the expiratory phase.  CARDIAC:  Normal first and second heart sounds; grade 1-2/6 basilar  systolic ejection murmur.  Normal  PMI.  ABDOMEN:  Soft and nontender; no organomegaly; normal bowel sounds  without bruits.  EXTREMITIES:  1 to 2+ distal pulses; no edema.  NEUROLOGIC:  Symmetric strength and tone; normal cranial nerves.  PSYCHIATRIC:  Flat affect.   EKG:  Sinus bradycardia; delayed R-wave progression; voltage criteria  for LVH.  No acute abnormality.  Comparison with prior tracing of July 08, 2005:  Left atrial enlargement was prominent.  On the prior exam;  there were significant ST-T wave abnormalities; R-wave progression was  normal at that time.   LABORATORY DATA:  Other laboratory notable for normal CBC, normal  chemistry profile, and normal cardiac markers.  BNP was 140.  Alcohol  was measured and was undetectable.   Chest x-ray shows prior sternotomy, hyperinflation of the lungs with  emphysema, and normal pulmonary vascularity.   IMPRESSION:  [name] has known coronary artery disease, but is only 3  years status post bypass graft surgery.  She presents with atypical  chest discomfort.  There seems to be a significant psychologic overlay  with chest discomfort associated with anxiety.  There also may be a  relationship to her chronic lung disease and/or to acute perturbations  introduced by smoking.  Since she has had no reassessment since bypass  graft surgery, we will proceed with a stress nuclear study.  Her last  carotid ultrasound exam showed no significant focal disease in 2006.  She had an intracranial MRA that was negative in 2008.  We will repeat  carotid ultrasound.  Her stress test may be performed either on an  outpatient or inpatient basis depending upon whether additional testing  is anticipated.  Her marked weight loss could relate to psychologic  issues or to COPD, although her lung disease does not appear severe.  She should have pulmonary function testing at some point.  A lipid  profile will be obtained.  The patient has been strongly advised to  discontinue cigarette  smoking and agrees to do so.  I will be happy to  follow her in the office.      Kim Friends. Dietrich Pates, MD, [facility]  Electronically Signed     RMR/MEDQ  D:  08/30/2008  T:  08/31/2008  Job:  [id number]

## 2010-05-22 NOTE — Discharge Summary (Signed)
[name], [name]                ACCOUNT NO.:  [id number]   MEDICAL RECORD NO.:  [id number]          PATIENT TYPE:  OBV   LOCATION:  A332                          FACILITY:  [facility]   PHYSICIAN:  Thad Ranger, MD       DATE OF BIRTH:  20-Jan-1925   DATE OF ADMISSION:  08/29/2008  DATE OF DISCHARGE:  08/25/2010LH                               DISCHARGE SUMMARY   CONSULTATIONS:  Cardiology, Dr. Dietrich Pates.   DISCHARGE DIAGNOSES:  1. Atypical chest pain.  2. Anorexia.  3. Chronic obstructive pulmonary disease.   HISTORY OF PRESENT ILLNESS:  [name] is an 77 year old female with  history of coronary artery disease with CABG, severe COPD, CVA, TIA, who  presented with chest discomfort.  Per the patient, the current episode  began a day prior to admission after the patient smoked a cigarette.  She reported chest discomfort of moderate intensity with some associated  dyspnea but no diaphoresis or nausea.  Symptoms resolved spontaneously  by the time she reached the emergency department.  She has a  longstanding history of cigarette smoking with mild to moderate chronic  lung disease.   PAST MEDICAL HISTORY:  1. Hypertension.  2. Hyperlipidemia.  3. Coronary artery disease, status post CABG 3 years ago.  4. Nicotine abuse.  5. Chronic COPD.  6. CVA/TIA.  7. Diverticular disease.   PHYSICAL EXAMINATION:  VITAL SIGNS:  Exam at the time of admission,  temperature 97.6, heart rate 78, blood pressure 165/95, respirations 16,  O2 sat 99% on 2 liters.  The rest of the physical exam was essentially normal.   LABORATORY/DIAGNOSTIC DATA:  EKG showed sinus bradycardia, delayed R-  wave progression, LVH.  No acute abnormality.  BNP was 140.  CBC and  chemistry profile was essentially unremarkable.  Cardiac markers were  normal.  Alcohol is undetectable.  Chest x-ray showed prior sternotomy,  hyperinflation of the lungs with emphysema and normal pulmonary  vascularity.  Other diagnostic data  during hospitalization:  Echocardiogram was done on August 24, which showed mild LVH, systolic  function normal, EF of 60% to 65% and normal wall motion.  No regional  wall motion abnormalities.  Carotid Dopplers showed minimal left carotid  bifurcation plaque without significant stenosis.  Does not exclude  plaque, ulceration or embolization.  The patient had a stress test done  today.  The Myoview images are pending at the time of my dictation.  However the patient tolerated this well with no EKG changes.  Pulmonary  function tests are pending at the time of my dictation.   HOSPITAL COURSE:  [name] is an 77 year old female with a history of  coronary artery disease, hypertension, hyperlipidemia, nicotine abuse,  moderate to severe COPD, presented with chest pain, atypical in  character and anorexia.  1. Atypical chest pain.  The patient did have risk factors of age,      hypertension, hyperlipidemia and prior coronary artery disease.      Although it seemed to be a significant psychological pattern with      chest discomfort associated with anxiety and  her chronic lung      disease.  The patient was admitted to Medical Floor and was ruled      out for acute MI.  A 2-D echocardiogram was performed which showed      EF of 60% to 65% with normal wall motion.  No regional wall motion      abnormalities were seen.  She also underwent carotid Dopplers which      showed no significant stenosis in right and left internal carotid      arteries.  Cardiology was consulted and the patient was evaluated      by Dr. Dietrich Pates.  She underwent stress test this morning.  The      patient tolerated the test well without any EKG changes.  Myoview      images are pending at the time of my dictation.  2. COPD.  The patient was counseled extensively for nicotine cessation      and she agrees to do so.  Pulmonary function testing is being done      to assess the severity of her lung disease, which could be  a cause      of her weight loss.  At the time of my dictation, the results are      still pending.  The patient will follow up with Dr. Dietrich Pates in the      office who will be following up with the PFTs results.  3. Anorexia, possibly psychological issues versus COPD.  The patient      did undergo speech and swallow testing which was essentially      normal.  The patient had a diagnostic colonoscopy in 2007 which had      not shown any malignancy.  She can follow up with Gastroenterology,      Dr. Jena Gauss for GI workup of anorexia.  The patient will be      discharged home today after the pulmonary function testing and      pending Myoview imaging results.   DISCHARGE MEDICATIONS:  1. Aspirin 325 mg daily.  2. Flonase two sprays nasal daily.  3. Mucinex 600 mg b.i.d.  4. Iron 150 mg p.o. daily.  5. Xopenex 0.63 mg q.6 h inhaled.  6. Toprol XL 25 mg daily.  7. Multivitamin 1 tablet p.o. daily.  8. Nicotine patch 14 mg every 24 hours.  9. Protonix 40 mg daily.  10.Simvastatin 20 mg daily.  11.Ativan 0.25 mg p.o. q.12 h p.r.n.  12.Nitroglycerin 0.4 mg sublingual as needed.   DISCHARGE FOLLOWUP:  Follow up with Dr. Dietrich Pates in 1-2 weeks and  primary care physician Dr. Patrica Duel in 2 weeks.   Discharge time 25 minutes.      Thad Ranger, MD  Electronically Signed     RR/MEDQ  D:  08/31/2008  T:  08/31/2008  Job:  [id number]   cc:   Patrica Duel, M.D.  Fax: [phone]   Gerrit Friends. Dietrich Pates, MD, [facility]  [address]  [place], [place] [address]

## 2010-05-22 NOTE — Procedures (Signed)
[name], [name]                ACCOUNT NO.:  [id number]   MEDICAL RECORD NO.:  [id number]          PATIENT TYPE:  OBV   LOCATION:  A332                          FACILITY:  [facility]   PHYSICIAN:  Edward L. Juanetta Gosling, M.D.DATE OF BIRTH:  Jul 01, 1925   DATE OF PROCEDURE:  DATE OF DISCHARGE:  08/31/2008                            PULMONARY FUNCTION TEST   1. Spirometry shows a mild ventilatory defect with evidence of airflow      obstruction.  2. Lung volumes show no evidence of restrictive pulmonary disease, but      do show increased RV suggesting air trapping.  3. DLCO is normal.  4. Arterial blood gas is normal.  5. There is improvement with inhaled bronchodilator, but it does not      reach the level of significance.      Edward L. Juanetta Gosling, M.D.  Electronically Signed     ELH/MEDQ  D:  08/31/2008  T:  09/01/2008  Job:  [id number]   cc:   Gerrit Friends. Dietrich Pates, MD, [facility]  [address]  [place], [place] [address]

## 2010-05-22 NOTE — Group Therapy Note (Signed)
[name], [name]                ACCOUNT NO.:  [id number]   MEDICAL RECORD NO.:  [id number]          PATIENT TYPE:  OBV   LOCATION:  A332                          FACILITY:  [facility]   PHYSICIAN:  Melissa L. Ladona Ridgel, MD  DATE OF BIRTH:  02/24/1925   DATE OF PROCEDURE:  08/30/2008  DATE OF DISCHARGE:  08/31/2008                                 PROGRESS NOTE   SUBJECTIVELY:  The patient continues to be labile with her emotions and  desired discharge to home versus staying in the hospital to complete her  workup.  She has agreed to stay for stress testing and is scheduled to  go downstairs for workup for swallowing difficulties as well as other  testing on her GI tract.  We await the findings of the speech therapist.  She also is to have carotid Dopplers which were ordered by cardiology.  I reviewed with her the sinus films that obtained which showed no  significant paranasal sinus disease, and I reviewed with her the  ultrasound findings which were significant for no obvious acute  cholecystitis or subacute disease.   PHYSICAL EXAMINATION:  VITAL SIGNS:  Temperature 96.7, blood pressure  150/70, pulse 65, respirations 18, saturation 100%.  GENERAL:  This is a cachectic ill-appearing white female in no acute  distress other than when she gets herself very anxious and she does  appear to be in distress.  HEENT:  She is normocephalic, atraumatic.  Pupils equal and reactive to  light.  Extraocular muscles intact.  Mucous membranes are moist.  NECK:  Supple.  There is no JVD.  I do not appreciate any carotid bruits,  although I see that Dr. Dietrich Pates did.  There is no thyromegaly.  Trachea  is midline.  CHEST:  Decreased and distant but with no rhonchi, rales or wheezes.  CARDIOVASCULAR:  Regular rate and rhythm.  Positive S1 and S2.  No S3 or  S4.  No murmurs, rubs or gallop.  There is a 1/6 systolic ejection  murmur.  ABDOMEN:  Soft, scaphoid, nontender, nondistended.  There are positive  bowel sounds.  There is no hepatosplenomegaly, no guarding or rebound.  EXTREMITIES:  Frail and wasted with no obvious rashes or lesions.  NEUROLOGICALLY:  Cranial nerves II-XII appear to be intact.  Power is  5/5.  DTRs 2+.  Plantars downgoing.  PSYCHIATRIC:  Affect varies between flat affect and cheerful.   PERTINENT LABORATORIES:  Thyroid studies:  TSH is be 3.925.  Lipids:  Cholesterol 193, triglycerides 46, HDL of 58, LDL of 66.  His cardiac  markers have been negative.  Sodium 40, potassium 0.9, chloride 107, CO2  31, glucose of 90, BUN 6, creatinine 0.54, white count of 9.4 with  hemoglobin of 13.6 and hematocrit 39.5, platelets of 195, and ethanol  level less than 5.   ASSESSMENT/PLAN:  This is a an 77 year old white female with known  tobacco abuse that she thinks is being hidden from the family.  She  presented with chest pain and shortness of breath and was found to have  a fairly significant dyspnea.  She was encouraged to stay in the  hospital.  However, she initially refused.  Subsequently she became more  calm and decided to stay.  We have ruled out for cardiac disease on  serial cardiac markers and Dr. Dietrich Pates has elected to do a stress test  on her in the morning.  I have encouraged her to allow me to continue to  work up reasons for weight loss and fatigue:  1. Chest pain:  Cardiac stress test in the a.m., continue her current      medications.  2. Tobacco abuse.  She has been counseled on cessation.  3. Polypharmacy:  The patient has been counseled on cessation of      buying over-the-counter medications which may interact with her      current medications.  4. Right upper quadrant pain and found not to be secondary to      gallbladder disease.  5. Difficulty with allowing appropriate testing to be carried out:      The patient is quite anxious and at times belligerent, not wanting      the care that is being offered.  But at present we have been able      to  encourage her today for further care.  Will await the results of      her study in the a.m. and prepare her for discharge.   Total time on this patient and her daughter was 30 minutes.      Melissa L. Ladona Ridgel, MD  Electronically Signed     MLT/MEDQ  D:  09/02/2008  T:  09/03/2008  Job:  [id number]

## 2010-05-22 NOTE — Discharge Summary (Signed)
[name], HEFEL                ACCOUNT NO.:  [id number]   MEDICAL RECORD NO.:  [id number]          PATIENT TYPE:  OBV   LOCATION:  A332                          FACILITY:  [facility]   PHYSICIAN:  Thad Ranger, MD       DATE OF BIRTH:  01-04-1926   DATE OF ADMISSION:  08/29/2008  DATE OF DISCHARGE:  LH                               DISCHARGE SUMMARY   ADDENDUM:  The discharge summary was done today, August 31, 2008, at  1359 hours.  The addendum is as follows.   DISCHARGE MEDICATIONS:  1. Metoprolol 25 mg XL 1 tablet daily.  2. Niferex 150/50 one tablet daily.  3. Aspirin 325 mg 1 tablet daily.  4. Multivitamin 1 tablet daily.  5. Flonase 50 mcg to spray each nostril two times daily.  6. Xopenex 0.63 mg inhaler use q.6 h.  7. Mucinex DM 600 mg take 1 tablespoon twice daily.  8. Vitamin B12 1000 mcg one daily.  9. Meclizine 25 mg one every 8 hours p.r.n. nausea.  10.Lorazepam 1 mg at 1/2 to 1 tablet q.8 h p.r.n. anxiety.  11.Fish oil 1000 mg 2 tablets three times a day.  12.Lipitor 10 mg 1 tablet daily.  13.Calcium 1200 mg with 1000 mg of vitamin D 1 tablet daily.  14.Ibuprofen 200 mg as needed q.6 h p.r.n. pain.  15.NyQuil as needed.  16.Zyrtec 10 mg 1 tablet daily.  17.NitroQuick as needed.      Stephani Police, PA      Thad Ranger, MD  Electronically Signed    MLY/MEDQ  D:  08/31/2008  T:  08/31/2008  Job:  [id number]

## 2010-05-25 NOTE — Assessment & Plan Note (Signed)
[facility]                       [facility] OFFICE NOTE   NAME:[name], [name]                       MRN:          [id number]  DATE:03/13/2006                            DOB:          01/10/1925    PATIENT IDENTIFICATION:  [name] is a 77 year old woman status post  CABG in June 2007 (LAD and circumflex). I last saw her in cardiology  clinic back in December. She also has a history of some orthostatic  hypotension and has been on __________ doing well.   The patient comes in today. She had a recent viral illness it sounds  like. In February though she fell at home 3 times, denied syncope, knows  when she is falling, knows the whole time of falling, questions if there  may be some palpitations during this but not clear. She bruised herself  but did not do any significant damage, no broken bones.   CURRENT MEDICATIONS:  1. Zyrtec 10 daily.  2. Calcium with D.  3. Vitamin C.  4. Aspirin 81 mg daily.  5. Vitamin B12.  6. Gingko Biloba daily.  7. Toprol XL 25 daily.  8. Lipitor 10 daily.  9. Fish oil daily.  10.Multivitamin daily.  11.Tramadol 50 b.i.d.  12.Midodrine 5 b.i.d.  13.Bronkaid daily.   PHYSICAL EXAMINATION:  GENERAL:  The patient is in no distress.  VITAL SIGNS:  Blood pressure on arrival 138/80, pulse 64; orthostatic  laying 140/64, pulse 60; standing 140/82, pulse 66. Weight 124, stable.   IMPRESSION:  [name] is a 77 year old with spells of falling but no  syncope, no real dizziness with the spells. I would set her up for an  event monitor, I am not sure what they represent. She should go ahead  and have this for a month. She also should get in touch with Dr.  Gerilyn Pilgrim for continued followup. I did not do a neurologic exam today.   I will make sure the patient has followup, I think it was already  scheduled actually from her last visit in December to be seen in April,  keep this and follow up with the monitor  results.     Pricilla Riffle, MD, [facility]  Electronically Signed    PVR/MedQ  DD: 03/13/2006  DT: 03/13/2006  Job #: [id number]   cc:   Darleen Crocker A. Gerilyn Pilgrim, M.D.  Patrica Duel, M.D.

## 2010-05-25 NOTE — Op Note (Signed)
[name], [name]                ACCOUNT NO.:  [id number]   MEDICAL RECORD NO.:  [id number]          PATIENT TYPE:  INP   LOCATION:  2308                         FACILITY:  [facility]   PHYSICIAN:  Salvatore Decent. Cornelius Moras, M.D. DATE OF BIRTH:  Nov 23, 1925   DATE OF PROCEDURE:  06/25/2005  DATE OF DISCHARGE:                                 OPERATIVE REPORT   PREOPERATIVE DIAGNOSIS:  Left main disease.   POSTOPERATIVE DIAGNOSIS:  Left main disease.   PROCEDURE:  Median sternotomy for off-pump coronary artery bypass grafting  x2 (saphenous vein graft to distal left anterior descending coronary artery,  saphenous vein graft to circumflex marginal branch, endoscopic saphenous  vein harvest from right thigh).   SURGEON:  Salvatore Decent. Cornelius Moras, M.D.   ASSISTANT:  Rowe Clack, P.A.-C.   ANESTHESIA:  General.   BRIEF CLINICAL NOTE:  The patient is a 77 year old female followed by Dr.  Patrica Duel and referred by Dr. Dionicio Stall for management of coronary  artery disease.  The patient presents with symptoms of chest pain consistent  with angina pectoris.  A stress Myoview exam reveals findings suspicious for  anterior septal ischemia.  Cardiac catheterization demonstrates a calcified  left main coronary artery with ostial left main coronary stenosis and  proximal left anterior descending coronary artery stenosis with normal left  ventricular function.  Transesophageal echocardiogram demonstrates trivial  mitral regurgitation and moderate tricuspid regurgitation.  The patient and  her family have been counseled at length regarding the indications, risks,  and potential benefits of coronary artery bypass grafting.  Alternative  treatment strategies have been discussed.  They understand and accept all  associated risks of surgery and desire to proceed as described.   OPERATIVE NOTE IN DETAIL:  The patient was brought to the operating room on  the above mentioned date and placed in the supine position  on the operating  table.  Central venous catheter and Swan-Ganz catheter are placed by the  anesthesia service under the care and direction of Dr. Bedelia Person.  A radial  arterial line is placed.  Intravenous antibiotics were administered.  Following induction with general endotracheal anesthesia, a Foley catheter  is placed.  The patient's chest, abdomen, both groins, and both lower  extremities were prepared and draped in a sterile manner.  Baseline  transesophageal echocardiogram was performed by Dr. Gypsy Balsam and Dr. Noreene Larsson.  This confirms findings similar to that from transesophageal echocardiogram  performed preoperatively in that there is trivial mitral regurgitation.  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  However, there is only trivial mitral  regurgitation.  Left ventricular function is normal.  No other significant  abnormalities are noted.   A median sternotomy incision is performed and the left internal mammary  artery is dissected from the chest wall.  The left internal mammary artery  is a very tiny vessel and after it is taken down from the chest wall  completely, it is divided distally after heparinization.  However, this  vessel is tiny with very little  antegrade blood flow to it.  It is cut back  several times and at its mid portion, it is slightly larger with adequate  flow but at this segment, the vessel is only approximately 2 inches long and  completely unsuitable to be utilized as a bypass graft even if it is  utilized as a free graft.  Simultaneously, saphenous vein was obtained from  the patient's right thigh using endoscopic vein harvest technique through a  small incision made just below the right knee.  The saphenous vein is  notably a very good quality conduit.  The left internal mammary artery was  subsequently transected proximally and the proximal stump was oversewn with  suture ligature.   The mammary artery test discarded and saphenous vein is  utilized for both bypass grafts.   After systemic heparinization, the pericardium was opened.  The surface of  the heart is normal in appearance.  The ascending aorta is normal in  appearance.  The Guidant acrobat stabilization system is utilized to  facilitate off-pump coronary artery bypass grafting using a combination of  apical suction cup as well as the suctioned stabilizing arm.  Elastic vessel  loops were used for proximal and distal hemostasis and no intracoronary  shunts were required.  Visibility is notably excellent with the patient in  steep Trendelenburg position and the table rotated towards the surgeon's  side with the right pleural space opened.   The following distal coronary anastomoses were performed:  1.  The circumflex marginal branch is grafted with a saphenous vein graft in      an end-to-side fashion.  This vessel measures 1.5 mm in diameter and is      good quality at the site of distal bypass.  2.  The distal left anterior descending coronary artery is grafted with      saphenous vein graft in an end-to-side fashion.  This vessel measures 2      mm in diameter and is good quality at the site of distal bypass.   Both proximal saphenous vein anastomoses were performed directly to the  ascending aorta under a partial occlusion clamp.  After completion of both  grafts, all proximal and distal anastomoses are inspected for hemostasis and  appropriate graft orientation.  Protamine is administered to reverse the  anticoagulation.  The patient tolerated the procedure very well with minimal  hemodynamic lability.  Epicardial pacing wires were affixed to the right  atrial appendage to facilitate atrial pacing to increase the heart rate.  The mediastinum is irrigated with saline solution containing vancomycin. Meticulous surgical hemostasis is ascertained.  The mediastinum and both  left and right pleural spaces  were drained using a combination of four chest  tubes exited through separate stab incisions inferiorly.  The soft tissues  and pericardium anterior to the aorta are reapproximated loosely.  The  sternum was closed with single strength sternal wire.  The soft tissues  anterior to the sternum are closed in multiple layers and the skin is closed  with a running subcuticular skin closure.   The patient tolerated the procedure well and is transported the surgical  intensive care unit in stable condition.  There are no interoperative  complications.  All sponge, instrument and needle counts were verified  correct at the completion of the operation.  No blood products were  administered.      Salvatore Decent. Cornelius Moras, M.D.  Electronically Signed     CHO/MEDQ  D:  06/25/2005  T:  06/25/2005  Job:  [id number]   cc:   Vida Roller, M.D.  Fax: [phone]   Patrica Duel, M.D.  Fax: [phone]

## 2010-05-25 NOTE — Discharge Summary (Signed)
NAMEOLIVIAH, [name]                ACCOUNT NO.:  [id number]   MEDICAL RECORD NO.:  [id number]          PATIENT TYPE:  INP   LOCATION:  2008                         FACILITY:  [facility]   PHYSICIAN:  Salvatore Decent. Cornelius Moras, M.D. DATE OF BIRTH:  05/29/1925   DATE OF ADMISSION:  06/21/2005  DATE OF DISCHARGE:                                 DISCHARGE SUMMARY   ANTICIPATED DATE OF DISCHARGE:  July 08, 2005 or July 09, 2005.   ADMISSION DIAGNOSES:  1.  Coronary artery disease with left main disease and preserved left      ventricular systolic function.  2.  Valvular heart disease with moderate tricuspid and mitral regurgitation      per previous 2D echocardiogram.  3.  Chronic obstructive pulmonary disease, mild to moderate.  4.  History of anemia.  5.  Hyperlipidemia.   DISCHARGE ADDITIONAL DIAGNOSES:  1.  Coronary artery disease with left main disease status post coronary      artery bypass graft.  2.  Trivial mitral regurgitation and moderate tricuspid regurgitation per      transesophageal echocardiogram.  3.  Ascending colon lesion per CT scan with colonoscopy still pending.  4.  Deconditioning.  5.  Thrombophlebitis right forearm, treated with Avelox.  6.  Postoperative left pleural effusion requiring thoracentesis.  7.  Diverticulitis.  8.  Osteopenia.  9.  History of hysterectomy.  10. History of ventral hernia repair.  11. History of appendectomy.  12. History of cerebrovascular accident in 1993.  13. Tonsillectomy.  14. Bilateral cataract extraction.  15. Postoperative acute blood loss anemia requiring transfusion.  16. Postoperative volume excess, improved.  17. Hyperlipidemia.  18. Hypertension.   ALLERGIES:  1.  PENICILLIN.  2.  SULFA.  3.  KEFLEX.  4.  CONTRAST DYE.   PROCEDURES DIAGNOSTICS:  1.  June 21, 2005, cardiac catheterization by Dr. Vida Roller showing      severe left main coronary artery disease with preserved left ventricular      systolic function by  echo.  2.  June 25, 2005, median sternotomy for off-pump coronary artery bypass      grafting x2 using the saphenous vein graft to the distal LAD, and      saphenous vein graft to the circumflex marginal branch, with endoscopic      vein harvesting from the right thigh, surgeon Dr. Tressie Stalker.  3.  June 25, 2005, intraoperative transesophageal echocardiogram by Dr.      Gypsy Balsam, see dictated report for details.  4.  Carotid Dopplers on June 24, 2005, showing bilateral mild heterogenous      plaque in the common carotid artery and proximal internal carotid      artery, but no internal carotid artery stenosis.  Vertebral artery flow      was antegrade right, increased velocities noted in the cerebral on the      vertebral artery at 139 cm/sec.  5.  Ultrasound-guided left thoracentesis on July 03, 2005.  6.  July 02, 2005, CT scan of the pelvis and abdomen without contrast showed  with significant findings for irregular luminal narrowing in the      ascending colon highly suspicious for an adenocarcinoma with further      followup with colonoscopy or barium enema recommended.   BRIEF HISTORY:  [name] is a 77 year old Caucasian female from [place],  [place] with no previous history of cardiac disease.  She was  recently referred to Dr. Vida Roller with symptoms of atypical chest  pain.  She reported this to her primary physician, Dr. Nobie Putnam at routine  follow up appointment and he referred her to Dr. Dorethea Clan who saw her on June  1st.  She underwent a stress Myoview exam which was abnormal and notable for  reproduction of symptoms of chest pain as well as nuclear finding suggestive  of anterior septal ischemia.  The patient also had a 2D echocardiogram that  reportedly demonstrated normal left ventricular function with moderate  mitral regurgitation and tricuspid regurgitation.  She was brought in for an  elective cardiac catheterization on June 15th by Dr. Dorethea Clan and was  found to  have 60% ostial stenosis of the left main coronary artery with calcification  and 60% proximal stenosis of the left anterior descending coronary artery.  A cardiac surgical consultation was requested and she was admitted for  further evaluation and treatment.   HOSPITAL COURSE:  [name] was admitted to [facility] on June 21, 2005, after cardiac catheterization demonstrated left main disease.  She was  seen by Dr. Tressie Stalker in consultation who recommended that she undergo  coronary artery bypass graft surgery as well as a TEE to further assess her  mitral and tricuspid valves, to consider replacement or repair of these  values as feasible.  After explaining risks and benefits she agreed to  proceed and she was ultimately taken to the operating room on June 25, 2005,  with results as discussed above.  Her postoperative course was somewhat  prolonged due to multiple issues as outlined in her discharge summaries.  Essentially, she was treated for acute blood loss anemia with blood  transfusion.  She also had intermittent cardiac arrhythmias primarily with,  specifically she had intermittent runs of bigeminy.  This was controlled  medically using beta-blocker therapy.  She also had developed leukocytosis,  with unknown source, with a white count around 22,000.  Her urine culture  was negative.  Sputum cultures were ordered but apparently a nonsatisfactory  specimen was obtained.  Her chest x-ray did show bibasilar atelectasis with  question of pneumonia.  She was treated with Avelox as well Advair and  Ventolin nebulizer treatments.  She also had some fluid volume excess  postoperatively with her BNP up to over 1300, but with diuretic therapy this  was down to 369 by discharge.  Most notably during her postoperative course,  she presented with symptoms that were felt to be exacerbation of her diverticulitis.  She was treated with Cipro and Flagyl.  However, her   abdominal pain persisted and she ultimately underwent a CT scan which showed  an ascending colon lesion as discussed above.  This prompted a  Gastroenterology consult and they recommended a colonoscopy to assess for  colon cancer.  While Dr. Cornelius Moras was in agreement that she would need a  colonoscopy, he was afraid that performing this so soon after her surgery  would be a setback in her recovery as she was quite deconditioned  postoperatively.  The patient is established with a Dr. Jena Gauss, a  gastroenterologist in [place], and she has agreed to schedule colonoscopy  in 2-3 weeks after discharge.  Another issue addressed during her  hospitalization included a large left effusion that did require ultrasound  drainage by Radiology in which 1130 mL of amber fluid was obtained.  It is  being anticipated that for her last several days of hospitalization she has  been out of the Surgical Intensive Care unit and on Telemetry Unit 2000.  She has made progression with mobilization and has been seen by Physical  Therapy who recommended either short term skilled nursing home placement or  home with home health.  Options were discussed with the patient and her  husband and they ultimately decided that they would prefer short term  nursing home care, preferably associated with [facility].  By July  1st, [name] was felt appropriate for discharge; however, Case Management  was still working on short term nursing home placement.  By this time it was  felt she was making good progress, she has remained afebrile with stable  vital signs with blood pressure showing 115/58 and her heart rate primarily  in the 80's-90's and in sinus rhythm.  She has been saturating 91-94% on  room air.  Her follow up chest x-ray on June 28th showed improving aeration  with a decrease in right upper and right lower lobe airspace opacity.  Small  bilateral pleural effusions were noted.   PHYSICAL EXAM:  HEART:  Had a  regular rate and rhythm.  LUNG SOUNDS:  Were relatively clear but coarse at the right base at times.  ABDOMINAL EXAM:  Was benign.  EXTREMITIES:  Showed no significant edema.  Incisions were healing well.  She was tolerating a regular diet and her pain was controlled on oral  medication.  She was still needing assistance to mobilize.   Her bowel and bladder were functioning appropriately.  She was no longer  having any abdominal pain.  Subsequently, it was felt she would be ready for  discharge to short term skilled nursing facility once a bed was made  available.   At the time of this dictation, her most recent labs show a BNP at 369, white  blood count decreasing at 13.4 thousand, hemoglobin 11.4, hematocrit 34,  platelet count 163, sodium 142, potassium 3.7, chloride 101, CO2 32, blood  glucose 93, BUN 9, creatinine 0.9, calcium 8.6.  Lipase 13, amylase 49, CEA normal at 3.3.  Folate, RBC was 1246.  She had a negative hemoccult stool on  June 18th.  Iron was 101, TIBC 295, % saturation 34, UIBC 194, blood vitamin  B12 was 497.  Hemoglobin A1c was normal at 4.6 on admission as well as her  liver function test which showed a total bilirubin of 0.6, alkaline  phosphatase 51, AST of 19, ALT of 14.  Her total protein was diminished at  5.5, blood albumin 3.5 and lipid profile showed a total cholesterol of 153,  triglycerides 52, HDL 48, LDL 95, total cholesterol to LDL ratio was 3.2.   DISCHARGE MEDICATIONS:  1.  Aspirin 325 mg p.o. daily.  2.  Toprol-XL 25 mg p.o. daily.  3.  Lipitor 10 mg p.o. q.p.m.  4.  Valium 5 mg p.o. b.i.d. p.r.n. for nerves.  5.  Multivitamin one p.o. daily.  6.  Zyrtec 10 mg one p.o. daily p.r.n. allergies.  7.  Advair 250/50 Diskus one puff inhaled b.i.d.  8.  Benefiber one packet p.o. as directed b.i.d.  9.  Calcium 500 mg with Vitamin D one p.o. daily.  10. Albuterol 2.5 mg via nebulizer inhaled q.6 hours p.r.n. for shortness of      breath or wheezing.   11. Ultram 50 mg 1-2 tablets p.o. q.6h. p.r.n. pain.   DISCHARGE INSTRUCTIONS:  She is to avoid driving or heavy lifting more than  10 pounds.  She should continue daily walking and breathing exercises.  Physical therapy should evaluate her an treat her as appropriate.  She is to  continue on a low fat, low salt diet and Ensure Plus one can p.o. q.i.d. is  recommended.  She may shower and clean her incision gently with soap and  water and Dr. Cornelius Moras should be notified if she develops fever greater than 101  or redness or drainage from her incision site or increasing shortness of  breath.   FOLLOW UP:  1.  Dr. Marchelle Folks office has scheduled her an appointment for July 09, 2005,      at 11:30 a.m.  She should have a chest x-ray at her appointment with Dr.      Dorethea Clan.  This will obviously need to be rescheduled if she remains      hospitalized at that time.  2.  She is to see Dr. Cornelius Moras in the CVTS office on July 22, 2005, at 1:45 p.m.  3.  She is to make an appointment with her gastroenterologist, Dr. Jena Gauss, in      2-3 weeks to schedule a colonoscopy.   CONSULTS:  1.  Physical Therapy.  2.  Rehabilitation Medicine (patient not felt to be candidate).  3.  [facility], Dr. Russella Dar.      Jerold Coombe, P.A.      Salvatore Decent. Cornelius Moras, M.D.  Electronically Signed    AWZ/MEDQ  D:  07/07/2005  T:  07/07/2005  Job:  [id number]   cc:   R. Roetta Sessions, M.D.  [address]  [place]  [place] [address]   Vida Roller, M.D.  Fax: [phone]   Salvatore Decent. Cornelius Moras, M.D.  [address]  [place]  [place] [address]

## 2010-05-25 NOTE — Op Note (Signed)
[name], [name]                ACCOUNT NO.:  [id number]   MEDICAL RECORD NO.:  [id number]          PATIENT TYPE:  AMB   LOCATION:  DAY                           FACILITY:  [facility]   PHYSICIAN:  R. Roetta Sessions, M.D. DATE OF BIRTH:  04-14-1925   DATE OF PROCEDURE:  07/18/2005  DATE OF DISCHARGE:                                 OPERATIVE REPORT   PROCEDURE:  Diagnostic colonoscopy.   INDICATIONS FOR PROCEDURE:  The patient is a 77 year old lady who was  recently hospitalized down at [facility] for coronary disease.  She underwent a  CABG.  She apparently has had some left-sided abdominal pain for which a CT  was ordered.  CT was read by Dr. Elly Modena.  He was concerned about a  suspicious area in the ascending colon.  He felt there may well be a  carcinoma present.  [name] abdominal symptoms have resolved.  She has  not had any bleeding or other lower GI tract symptoms recently.  She had a  colonoscopy back in February of 2004 which revealed internal hemorrhoids, a  small hyperplastic polyp in the rectum, and left-sided diverticula.  I  reviewed the CT scan with Dr. Cristal Ford today.  The area referenced by  Dr. Chandra Batch on the prior reading is interesting.  It could be nothing more  than a prominent fold.  At any rate, she is undergoing colonoscopy to  further evaluate it.  This approach has been discussed with the patient at  length previously and again today at the bedside.  The potential risks,  benefits, and alternatives have been reviewed.  Please see the documentation  in the medical record.   PROCEDURE:  O2 saturation, blood pressure, pulses, and respirations were  monitored throughout the entirety of the procedure.  Conscious sedation was  with Versed 2 mg IV, Demerol 50 mg IV.  SP prophylaxis with vancomycin 1 g  IV and gentamycin 80 mg IV prior to the procedure.  The instrument used was  the Olympus video chip system.  The 180 scope was used.   FINDINGS:  Digital  rectal examination revealed no abnormalities.   ENDOSCOPIC FINDINGS:  The prep was good.   Rectum:  Examination of the rectal mucosa including retroflex view of the  anal verge revealed only internal hemorrhoids.   Colon:  The colonic mucosa was surveyed from the rectosigmoid junction  through the left, transverse, right colon to the area of the appendiceal  orifice, ileocecal valve, and cecum.  These structures were well-seen and  photographed for the record.  The terminal ileum was intubated at 5 cm.  From this level, the scope was slowly and cautiously withdrawn.  All  previously mentioned mucosal surfaces were again seen.  The patient did have  scattered left-sided diverticula, as previously noted.  The right colon was  well-seen, and there was no evidence of neoplasia or any other colonic  mucosal abnormality.  The patient tolerated the procedure well and was  reacted in endoscopy.   IMPRESSION:  1.  Internal hemorrhoids.  Otherwise, normal rectum.  2.  Left-sided diverticula.  The remainder of the colonic mucosa and      terminal ileal mucosa appeared normal.   I feel that we can now conclude that the abnormality highlighted on her  recent CT was an accentuated haustration on a couple of CT cuts.   RECOMMENDATIONS:  1.  Diverticulosis literature provided to [name]  2.  Recommend repeat screening colonoscopy in 10 years if she remains      otherwise fit.      Jonathon Bellows, M.D.  Electronically Signed     RMR/MEDQ  D:  07/18/2005  T:  07/18/2005  Job:  [id number]   cc:   Patrica Duel, M.D.  Fax: [phone]

## 2010-05-25 NOTE — Assessment & Plan Note (Signed)
[name]                       [facility] OFFICE NOTE   NAME:[name], [name]                       MRN:          [id number]  DATE:12/19/2005                            DOB:          Jul 15, 1925    [name] is a 77 year old woman who is status post CABG (June 2007),  SVG to LAD and circumflex.  She was last seen in Cardiology Clinic by  Dr. Noralyn Pick. [name] on November 08, 2005 and was doing okay at that time.  On December 18, 2005, she vacuumed the floor and she woke up this  morning at about 2 a.m. with severe chest pain.  She took two Tylenol  and dosed off, still not feeling right, took half a Valium,  nitroglycerin, aspirin and eventually the pain eased but did not  completely go away.  Had some shortness of breath but the pain is worse  with inspiration.  She has had a lot of problems with chest pain since  the bypass surgery.   The patient says that over the past few weeks she has had intermittent  chest pressure that has been acting up.   CURRENT MEDICATIONS:  1. Zyrtec 10 daily.  2. Calcium with D daily.  3. Vitamin C daily.  4. Aspirin 325 daily.  5. B12 daily.  6. Ginkgo Biloba daily.  7. Toprol XL 25 daily.  8. Lipitor 10 daily.  9. Fish oil daily.  10.Multivitamin daily.  11.Microbid for UTI.   PHYSICAL EXAMINATION:  The patient is in no distress.  Blood pressure is  118/70, pulse is 54, weight 124.  Lungs are clear.  Cardiac exam regular  rate and rhythm.  S1-S2.  No S3 or S4.  Chest very tender on palpation  across precordium and particularly in the left lateral side.  This  brings on the pain she has been having.  Abdomen is benign.  Extremities  no edema.   A 12-lead EKG normal sinus rhythm, 64 beats per minute and nonspecific  ST changes.   IMPRESSION:  Chest pain:  It appears to be more musculoskeletal.  Again,  on palpation today I bring on, makes pain worse.  I have told her to  take 400 mg of Motrin now  and tonight and then we will begin Ultram  therapy and see how she does.  I told her to avoid excess exertion with  her upper extremities over the next several days to couple of weeks to  see if this will heal.  I did not  get a chest x-ray today but will be in touch with her again over the  next few days.  Again, follow up in a couple of weeks.     Kim Riffle, MD, [facility]  Electronically Signed    PVR/MedQ  DD: 12/19/2005  DT: 12/20/2005  Job #: [id number]   cc:   [name], M.D.

## 2010-05-25 NOTE — H&P (Signed)
NAMELACYE, MCCARN                ACCOUNT NO.:  [id number]   MEDICAL RECORD NO.:  [id number]          PATIENT TYPE:  OIB   LOCATION:  1965                         FACILITY:  [facility]   PHYSICIAN:  Vida Roller, M.D.   DATE OF BIRTH:  01/12/1925   DATE OF ADMISSION:  06/21/2005  DATE OF DISCHARGE:                                HISTORY & PHYSICAL   PRIMARY CARE PHYSICIAN:  Patrica Duel, M.D.   HISTORY OF PRESENT ILLNESS:  Mrs. Tijerino is a woman that I follow who has  chest discomfort which has occurred over the last 4 weeks, associated with  sleep.  She does not have discomfort with exertion but she does get the  discomfort that lasts probably about 20 minutes.  It usually resolves  spontaneously.  She has limited her activities somewhat because of the  discomfort and also some mild orthopedic issues.  She came to the cardiac  catheterization laboratory today after a perfusion scan showed significant  perfusion abnormality in the anterior septum consistent with obstructive  coronary disease, and we went to the catheterization lab for diagnostic  coronary angiography.  She had previously had an echocardiogram which showed  moderate mitral regurgitation with normal LV systolic function.  Coronary  angiography showed significant and calcified left main coronary disease, and  she is admitted for surgical evaluation and consideration for surgical  revascularization and possible mitral repair for regurgitation.   PAST MEDICAL HISTORY:  Significant for hyperlipidemia, which is minimally  treated, and anemia of unknown etiology.  She has very mild to moderate COPD  which is not well-documented.  She had a hysterectomy about 20 years ago.  No other surgeries.   She is allergic to PENICILLIN, SULFA and KEFLEX.   She does not smoke, drink or use illicit drugs.  She is retired.  She is  married.  She has three kids.   FAMILY HISTORY:  Her father died at age 75 in an automobile accident.  Mother died at age 75 of heart problems.  She has two brothers who passed  away, one of pneumonia, one of unknown causes.  One sister who passed away  of heart problems associated with some asthma.   REVIEW OF SYSTEMS:  She denies any headaches, no dizziness.  Sometimes she  is unsteady on her feet just from her orthopedic issues.  She does wear  glasses but has no significant visual problems.  She had cataracts removed  from both her eyes.  She has no hearing loss, no dental problems.  No  palpitations or heart murmur.  No high blood pressure.  No changes in her  appetite, no nausea, vomiting, diarrhea or constipation.  No ulcers,  jaundice or colitis.  No reflux symptoms or urinary incontinence, although  occasional frequency, and the remainder of her review of systems is  negative.   PHYSICAL EXAMINATION:  GENERAL:  She is a thin white female in no apparent  distress, alert and oriented x4.  She is a good historian.  VITAL SIGNS:  Her weight is 132 pounds, blood pressure 120/76, pulse of 82.  HEENT: Unremarkable.  NECK:  Supple.  There is no jugular venous distension or carotid bruits.  CHEST:  Clear to auscultation.  CARDIOVASCULAR:  Regular with no significant murmurs.  Point of maximum  impulse is not displaced.  First and second heart sounds are normal.  ABDOMEN:  Soft, nontender.  Normoactive bowel sounds.  EXTREMITIES:  Lower extremities are without significant edema.  Pulses are  1+.  There are no bruits noted.  NEUROLOGIC:  Exam is grossly unremarkable.   Electrocardiogram shows sinus rhythm with an occasional PVC, which is  monomorphic, a left bundle branch block configuration. There is a  questionable left atrial enlargement, otherwise unremarkable exam   LABORATORIES:  She has had a CBC, which shows anemia, H&H of 11 and 35,  platelet count 271, with normal white blood cells.  Her comprehensive  metabolic panel is normal.  Lipid panel shows total of 191, triglycerides   110, HDL 53, LDL 116.  Thyroid function studies are normal.  She has had a  chest x-ray, of which shows diffuse hyperinflation, no acute infiltrates,  borderline cardiac size.  No other studies.   1.  This is a lady with left main coronary disease who will need a surgical      evaluation.  She has preserved left ventricular systolic function.  2.  Valvular heart disease with moderate tricuspid and mitral regurgitation.      We will probably need an evaluation for this prior to her bypass      surgery.  3.  Chronic obstructive pulmonary disease, which is mild to moderate.  She      will probably need pulmonary function tests prior to her bypass surgery.  4.  She is allergic to PENICILLIN, SULFA and KEFLEX.  5.  She is a history of anemia which is not well-known.  6.  She has hyperlipidemia and we will need to start some lipid-lowering      medication on her.   My plan is to admit her to [facility], CVTS consult and consideration for a  TEE on Monday prior to her bypass surgery.  Will use Lovenox,nitroglycerin  and aspirin on her and start her on a cholesterol lowering medication,  probably Lipitor 80 mg p.o. q.h.s.      Vida Roller, M.D.  Electronically Signed     JH/MEDQ  D:  06/21/2005  T:  06/21/2005  Job:  [id number]   cc:   Patrica Duel, M.D.  Fax: [phone]

## 2010-05-25 NOTE — Assessment & Plan Note (Signed)
[facility]                              CARDIOLOGY OFFICE NOTE   NAME:[name]                       MRN:          [id number]  DATE:11/08/2005                            DOB:          Dec 31, 1925    [name] returns today for followup.  She is status post CABG in June.  She  has good LV function.  She had a vein graft to her LAD and circ.  Her LIMA  was not used.   She is finishing up cardiac rehab this week.  She has had some exertional  dyspnea.  I think it is benign.  She has not had any significant PND or  orthopnea.  Her weight is stable.  There has been no lower extremity edema.   The patient did have a right pleural effusion postoperatively but this seems  to have cleared.   She has not had any fevers, URI symptoms, or sputum.  She needs a flu shot.   MEDICATIONS:  1. Zyrtec 10 mg a day.  2. An aspirin a day.  3. Toprol 25 a day.  4. Lipitor 10 a day.  5. Proventil p.r.n.   PHYSICAL EXAMINATION:  VITAL SIGNS:  The weight is 125 which is stable,  blood pressure is 110/68, pulse is 64 and regular.  LUNGS:  Clear.  There is no evidence of effusion.  There is no wheezing.  HEENT:  Normal.  There is no thyromegaly.  There is no lymphadenopathy.  JVP  is low.  HEART:  There is S1 S2 with normal heart sounds.  Sternum is well healed.  She does have a small area of chronic pain near the right second rib which  is likely from her sternotomy.  ABDOMEN:  Benign.  LOWER EXTREMITIES:  Intact pulses.  No edema.   IMPRESSION:  1. Stable status post coronary artery bypass graft.  2. Exertional dyspnea most likely related to deconditioning.  3. Pleural effusion, resolved on exam.  No evidence of arrhythmia.  4. Normal left ventricular function prior to coronary artery bypass graft.   1. The patient will continue her aspirin and beta-blocker.  2. We will recheck her cholesterol and LFTs in 6 months.  3. She will follow up with Dr.  Nobie [name].  4. We will try and get her a flu shot today or have her to go [name]'s      office for this.   Overall, I think she is doing extremely well and we will finish cardiac  rehab next week.    ______________________________  Noralyn Pick. Eden Emms, MD, [facility]    PCN/MedQ  DD: 11/08/2005  DT: 11/08/2005  Job #: [id number]

## 2010-05-25 NOTE — Assessment & Plan Note (Signed)
[facility]                         [place] CARDIOLOGY OFFICE NOTE   NAME:[name], [name]                       MRN:          [id number]  DATE:09/02/2005                            DOB:          Nov 13, 1925   PRIMARY CARE PHYSICIAN:  Patrica Duel, M.D.   Mrs. Costanza returns today.  This is a lady we have followed who has coronary  artery disease, status post bypass surgery in June of this year.  Dr. Tenny Craw  had seen her last month as she had had some worsening shortness of breath  and had an enlarging pleural effusion on the left on chest x-ray.  She is  back today to be reevaluated.  She feels reasonably good. She still has some  shortness of breath but it is not nearly as bad as it was a month ago.  No  chest pain.  Doing fine with her medications. She is in cardiac rehab being  active.  Pulse is 65, respirations 18, blood pressure 100/68 in both arms.  Her chest has decreased breath sounds at the left base with some dullness to  percussion at the base.  Cardiovascular exam is regular.  Medium sternotomy  scar is well healed.  Vein harvest sites which are endoscopic look fine in  the lower extremity.   MEDICATIONS:  1. She is on Zyrtec 10 mg once a day.  2. Calcium once a day.  3. Vitamin C once a day.  4. Aspirin 325 mg once  a day.  5. Vitamin B12 once a day.  6. Toprol-XL 25 mg a day.  7. Lipitor 10 mg a day.  8. She uses Proventil nebulizers on an as-needed basis for her COPD.  9. Fish oil once a day.  10.Vitamin E once a day.  11.Multivitamins once a day.   1. So coronary disease, recent bypass surgery, doing reasonably well.      Secondary prophylaxis is up to date.  She does not smoke. Her blood      pressure is well controlled.  Her cholesterol is reasonable on      medication.  Will recheck that shortly.  She is physically active in      cardiac rehab.  She does not have diabetes.  2. Pleural effusion:  Will get a chest x-ray today  to make sure that looks      okay and if there is      an increase in the pleural effusion, I am going to send her to see Dr.      Cornelius Moras for further consideration.                                   Farris Has. Dorethea Clan, MD   JMH/MedQ  DD:  09/02/2005 DT:  09/03/2005 Job #:  [id number]   cc:   Patrica Duel, MD  Salvatore Decent Cornelius Moras, MD

## 2010-05-25 NOTE — H&P (Signed)
[name], [name]                ACCOUNT NO.:  [id number]   MEDICAL RECORD NO.:  [id number]          PATIENT TYPE:  AMB   LOCATION:  DAY                           FACILITY:  [facility]   PHYSICIAN:  Kassie Mends, M.D.      DATE OF BIRTH:  10/30/1925   DATE OF ADMISSION:  07/18/2005  DATE OF DISCHARGE:  LH                                HISTORY & PHYSICAL   REASON FOR VISIT:  Mass seen in colon on CT, colonoscopy.   HISTORY OF PRESENT ILLNESS:  [name] is a 77 year old lady recently  discharged from [facility] after undergoing CABG for coronary  artery disease with left main disease.  During her hospitalization, she  underwent a CT scan of the abdomen and pelvis on July 02, 2005, which  revealed an area of irregular luminal narrowing in the ascending colon, seen  in all three planes, highly suspicious for adenocarcinoma.  She also had a  right lobe liver lesion measuring 9 mm, which was felt to be a simple cyst.  This was done with oral contrast only, however.  The patient is followed by  Dr. Jena Gauss for IBS and diverticulosis.  She has a history of alternating  constipation and diarrhea.  The last time she was seen in the office was in  April 2007.  She tends to have anywhere from one to five stools a day and  some days no bowel movements.  She takes Pamine, which helps tremendously.  The Pamine helps her abdominal cramping as well.  She denies any melena or  rectal bleeding.  Her appetite is not as good as it was prior to her heart  surgery.  She is drinking Ensure daily.  Her weight is down about 10 pounds  since we last saw her.  She denies any nausea or vomiting.  She denies  heartburn.   CURRENT MEDICATIONS:  1.  Multivitamin daily.  2.  Vitamin B12 daily.  3.  Calcium 1200 mg plus vitamin D daily.  4.  Benefiber p.r.n.  5.  Aspirin 325 mg daily.  6.  Gingko biloba b.i.d.  7.  Vitamin C p.r.n.  8.  Zyrtec daily.  9.  Pamine Forte 1 30 minutes before meals and at  bedtime as needed.  10. Toprol 25 mg daily.  11. Lipitor 10 mg daily.   ALLERGIES:  1.  TAPE ADHESIVE.  2.  PENICILLIN.  3.  IVP DYE.  4.  SULFA.  5.  KEFLEX.  6.  OXYCODONE.   PAST MEDICAL HISTORY:  1.  Irritable bowel syndrome.  2.  A history of CVA in 1993.  3.  Seasonal allergies.  4.  TIA.  5.  Coronary artery disease with left main disease, status post coronary      artery bypass graft times two.  She had trivial mitral regurgitation and      moderate tricuspid regurgitation on transesophageal echocardiogram.  She      also has bilateral mild heterogeneous plaque in the common carotid      artery and proximal internal carotid artery but  no internal carotid      artery stenosis.  6.  During recent hospitalization, she developed postoperative left lower      effusion requiring thoracentesis.  7.  Thrombophlebitis of the right arm treated with Avalox.  8.  A history of diverticulosis.  9.  Osteopenia.  10. Postoperative acute blood loss requiring transfusion.  11. Hyperlipidemia.  12. Hypertension.  13. She is status post hysterectomy.  14. Ventral hernia repair.  15. Appendectomy.  16. Bilateral cataract extraction.  17. A history of pyelonephritis.  18. COPD, mild to moderate.  19. Colonoscopy February 08, 2002 by Dr. Jena Gauss revealed internal hemorrhoids,      small hyperplastic polyp in the rectum, which was biopsied, and left-      sided diverticula.   FAMILY HISTORY:  Negative for IBD or colorectal cancer.  Both parents  deceased with significant coronary artery disease and CHF.   SOCIAL HISTORY:  She is married.  She has three children.  She does not  smoke, drink or use illicit drugs.   REVIEW OF SYSTEMS:  See HPI for GI and constitutional.  CARDIOPULMONARY:  Denies any chest pain or shortness of breath.  Some dyspnea on exertion and  fatigue with exertion.   PHYSICAL EXAMINATION:  VITAL SIGNS:  Weight 128.5, down from 139,  temperature 97.9, blood pressure  110/58, pulse 72.  GENERAL:  Pleasant, thin, elderly Caucasian female in no acute distress.  SKIN:  Warm and dry, no jaundice.  HEENT:  Sclerae are nonicteric.  Oropharyngeal mucosa moist and pink.  No  lymphadenopathy or thyromegaly.  CHEST:  Lungs are clear to auscultation except for decreased breath sounds  in the left base. CARDIAC:  Reveals regular rate and rhythm.  No murmurs,  rubs or gallops. ABDOMEN:  Positive bowel sounds, soft, nondistended.  She  has mild right lower quadrant tenderness to deep palpation right over her  appendectomy scar.  No rebound tenderness or guarding.  No abdominal bruits  or hernias.  No palpable masses. EXTREMITIES:  No edema.   LABORATORY:  On June 24, 2005, iron was 101, TIBC 295, iron saturation 34%.  On June 22, 2005, hemoglobin 11.1.  She had a postoperative anemia requiring  2 units of packed red blood cells.  At the time of discharge on July 3, her  hemoglobin was 13, hematocrit 38.5.   IMPRESSION:  [name] is a 77 year old lady who recently underwent  abdominal pelvic CT with oral contrast to further evaluate abdominal pain  during hospitalization, for which she had a coronary artery bypass graft.  She was found to have a suspicious irregular luminal narrowing in the  ascending colon.  Her last colonoscopy was in February of 2004 as outlined  above.  Not noted above, she did have a CEA which was only 3.3, in the  normal range.  She needs to undergo a colonoscopy for further evaluation of  abnormal CT findings.  The patient's daughter is present today.  She states  that she has been cleared by her cardiologist to proceed with the  colonoscopy at this time.  She will receive SBE prophylaxis, given valvular  abnormality seen on recent TEE.   PLAN:  1.  Colonoscopy with Dr. Jena Gauss next week.  2.  We will hold her aspirin for 3 days.  3.  SBE prophylaxis to be provided.  Kassie Mends, M.D. is the cosigner in the absence of Dr.  Jena Gauss.      Tana Coast, P.A.  Kassie Mends, M.D.  Electronically Signed    LL/MEDQ  D:  07/12/2005  T:  07/12/2005  Job:  [id number]   cc:   Patrica Duel, M.D.  Fax: [phone]

## 2010-05-25 NOTE — Op Note (Signed)
[name], LAZARUS                ACCOUNT NO.:  [id number]   MEDICAL RECORD NO.:  [id number]          PATIENT TYPE:  INP   LOCATION:  2308                         FACILITY:  [facility]   PHYSICIAN:  Bedelia Person, M.D.        DATE OF BIRTH:  1925-11-15   DATE OF PROCEDURE:  06/25/2005  DATE OF DISCHARGE:                                 OPERATIVE REPORT   PROCEDURE:  Intraoperative transesophageal echocardiography.   Ms. Kalama is an elderly female with known coronary artery disease and  suspected mitral valvular disease.  The TEE will be used intraoperative to  assess left ventricular function as well as evaluation for possible repair  or replacement of the mitral valve.   After induction with general anesthesia, the oral airway was secured with an  oroendotracheal tube.  The transesophageal transducer was lubricated, placed  in a sleeve.  The sleeve was then lubricated and placed blindly down the  oropharynx to the 40 cm mark.  It remained there throughout the case,  obtaining various plane views.  The initial examination revealed the left  ventricle to be slightly thickened with overall good contractility.  There  were no segmental defects noted.  The left atrium was normal size and  appearance.  The appendage was clean.  The septum was intact.  The mitral  valve showed both anterior and posterior leaflet thickening in a myxomatous  pattern.  The leaflets did coapt in the valvular plane.  Color Doppler  revealed trace to no significant mitral regurgitant flow.  The aortic valve  had three leaflets, opened and closed appropriately, normal size and  appearance.  No calcium was noted.  There was trace aortic insufficiency in  a central pattern.  The tricuspid valve had two leaflets.  The Swan-Ganz  catheter was noted across the valve.  There was mild tricuspid regurgitation  on color Doppler.  Right heart exam was essentially normal.   The surgeons elected to do coronary artery bypass graft  off pump.  Three  grafts were placed.  At the completion of graft placements, there was no  significant change in left ventricular function.  Left ventricular function  continued to be good overall with no segmental defects.  There was no change  in the mitral or the aortic valvular appearance.  No intervention was  undertaken to the mitral valve.           ______________________________  Bedelia Person, M.D.     LK/MEDQ  D:  06/25/2005  T:  06/25/2005  Job:  [id number]

## 2010-05-25 NOTE — Assessment & Plan Note (Signed)
[facility]                       [place] CARDIOLOGY OFFICE NOTE   NAME:[name], [name]                       MRN:          [id number]  DATE:01/06/2006                            DOB:          [birth year]    IDENTIFICATION:  Ms. Tercero is a 77 year old woman status post CABG in  June 2007 (LAD and circumflex).  I last saw her in cardiology clinic  back on December 13.  She came in for evaluation of chest pain.   When I saw her, her pain appeared to be more musculoskeletal, as it was  exacerbated by palpation.  I recommended Motrin 400 mg for a few days,  and then gave her a prescription for tramadol.   In the interval, she still has chest pain at times, left-sided, worse  when her blood pressure is low, she thinks.  She thinks it has gotten  better on the tramadol.  She really has had this since surgery.   Otherwise, breathing is unchanged.   CURRENT MEDICATIONS:  1. Zyrtec 10 daily.  2. Calcium with D daily.  3. Vitamin C daily.  4. Aspirin 81 mg daily.  5. B12 daily.  6. Gingko biloba daily.  7. Toprol XL 25 daily.  8. Lipitor 10 daily.  9. Fish oil daily.  10.Multivitamin daily.  11.Tramadol 50 b.i.d.  12.Midodrine 5 b.i.d.   PHYSICAL EXAMINATION:  Patient is in no distress.  Blood pressure  128/70.  Pulse is 60.  Weight 125.  LUNGS:  Clear.  Chest wall tenderness in the left side, brings on the  pain.  CARDIAC:  Regular rate and rhythm, S1, S2, no S3, no murmurs.  ABDOMEN:  Benign.  EXTREMITIES:  No edema.   IMPRESSION:  1. Coronary artery disease.  Her current pain appears to be more      muscular, in fact, probably all muscular.  Would continue on      current regimen including tramadol.  2. Orthostasis.  I do not have record of orthostatic changes.  She      does say she has dizzy days, and that Dr. Nobie Putnam has followed      this.  She was placed on the midodrine by Dr. Gerilyn Pilgrim in      Neurology.  I would keep her on this  since she seems to be      tolerating.  3. Dyslipidemia.  Will need to get fasting lipids at her convenience.   Otherwise, will follow up in April of 2008.     Pricilla Riffle, MD, [facility]  Electronically Signed    PVR/MedQ  DD: 01/06/2006  DT: 01/06/2006  Job #: [id number]   cc:   Patrica Duel, M.D.

## 2010-05-25 NOTE — Cardiovascular Report (Signed)
[name], [name]                ACCOUNT NO.:  [id number]   MEDICAL RECORD NO.:  [id number]          PATIENT TYPE:  OIB   LOCATION:  1965                         FACILITY:  [facility]   PHYSICIAN:  Vida Roller, M.D.   DATE OF BIRTH:  February 19, 1925   DATE OF PROCEDURE:  DATE OF DISCHARGE:                              CARDIAC CATHETERIZATION   PRIMARY:  Dr. Patrica Duel in [place], [place].   HISTORY OF PRESENT ILLNESS:  [name] is a 77 year old woman who I  follow for a discomfort in her chest.  She had an adenosine Myoview, which  showed anteroseptal defect consistent with obstructive coronary disease with  preserved LV systolic function.  She had an echocardiogram, which showed  moderate mitral regurgitation, normal LV systolic function, moderate  tricuspid regurgitation.  She was referred for evaluation.   PROCEDURES PERFORMED:  1.  Left heart catheterization.  2.  Selective coronary angiography.   DETAILS OF THE PROCEDURE:  After obtaining informed consent, the patient was  brought to the cardiac catheterization laboratory where she was prepped and  draped in the usual sterile manner.  Local anesthetic was obtained of the  right groin using 1% lidocaine without epinephrine, and the right femoral  artery was cannulated using the modified Seldinger technique with a 4-French  10-cm sheath.  Left heart catheterization was performed using a 4-French  Judkins left #4, and a 4-French 3D RC catheter.  On conclusion of the  procedure, the catheters were removed.  The patient was moved back to the  cardiology holding area where the femoral artery sheath was removed.  Hemostasis was obtained using direct manual pressure.  At the conclusion of  the holder, there was no evidence of ecchymosis or hematoma formation.  Distal pulses were intact.  Total fluoroscopic time was 2.5 minutes.  Total  ionized contrast used was 100 mL.   RESULTS:  Aortic pressure 145/66, mean of 97  mmHg.   SELECTIVE CORONARY ANGIOGRAPHY:  The left main is a moderate-to-large  caliber vessel, which is severely calcified in its proximal portion.  Upon  engagement of the catheter, there was significant dampening with evidence of  obstructive disease.  Angiography revealed a 60 - 70% lesion in the proximal  portion of the left main coronary artery.   The left anterior descending coronary artery is a moderate caliber vessel  with a 60 - 70% proximal lesion.  It is also significantly and severely  calcified.   The left circumflex coronary artery is a moderate caliber vessel with a  large first obtuse marginal, which branches across the anterolateral wall.  There is 4 distinct branches to the obtuse marginal.  There is also a  posterolateral branch.  The circumflex coronary is free of disease.   The right coronary artery is a small caliber vessel, which is dominant; has  a small posterior descending coronary artery and appears to collateralize  the inferior portion of the LAD distribution.   No left ventriculogram was performed.   ASSESSMENT:  1.  Severe left main coronary artery disease.  2.  Preserved left ventricular systolic  function by echo.   PLAN:  Admit to [facility] for surgical evaluation.      Vida Roller, M.D.  Electronically Signed     JH/MEDQ  D:  06/21/2005  T:  06/21/2005  Job:  [id number]   cc:   Patrica Duel, M.D.  Fax: [phone]

## 2010-05-25 NOTE — Procedures (Signed)
[name], [name]                ACCOUNT NO.:  [id number]   MEDICAL RECORD NO.:  [id number]          PATIENT TYPE:  OUT   LOCATION:  RAD                           FACILITY:  [facility]   PHYSICIAN:  Vida Roller, M.D.   DATE OF BIRTH:  10-Nov-1925   DATE OF PROCEDURE:  06/13/2005  DATE OF DISCHARGE:                                  ECHOCARDIOGRAM   PRIMARY CARE PHYSICIAN:  Patrica Duel, M.D.   TAPE NUMBER:  [id number].   TAPE COUNT:  [id number].   This is a 77 year old woman with chest discomfort. The technical quality of  the study is adequate.   M-MODE TRACINGS:  The aorta is 28 mm.   The left atrium is 41 mm.   Septum is 10 mm.   Posterior wall is 9 mm.   Left ventricular diastolic dimension 41 mm.   Left ventricular systolic dimension 27 mm.   2D AND DOPPLER IMAGING:  The left ventricle is normal size. There is preserved LV systolic function  with an ejection fraction of 65-70%. There are no wall motion abnormalities  seen.   The right ventricle is normal size with normal systolic function.   Both atria are dilated.   The aortic valve is sclerotic with mild insufficiency, no stenosis is seen.   The mitral valve has mild to moderate regurgitation, no stenosis is seen.   Tricuspid valve with moderate regurgitation.   No pericardial effusion.   Inferior vena cava is normal size.      Vida Roller, M.D.  Electronically Signed     JH/MEDQ  D:  06/13/2005  T:  06/14/2005  Job:  [id number]   cc:   Patrica Duel, M.D.  Fax: [phone]

## 2010-05-25 NOTE — Procedures (Signed)
NAMEKATIEJO, GILROY                ACCOUNT NO.:  [id number]   MEDICAL RECORD NO.:  [id number]          PATIENT TYPE:  OUT   LOCATION:  RAD                           FACILITY:  [facility]   PHYSICIAN:  Vida Roller, M.D.   DATE OF BIRTH:  1925-03-18   DATE OF PROCEDURE:  06/13/2005  DATE OF DISCHARGE:                                    STRESS TEST   HISTORY:  77 year old female with no known coronary disease, atypical chest  discomfort.  Cardiac risk factors of dyslipidemia and age.   RESTING DATA:  EKG:  Sinus bradycardia at 53 beats per minute, poor R wave  progression, nonspecific ST abnormalities.  Blood pressure is 144/88.   34 mg of adenosine was infused over a 4 minute protocol.  Myoview was  injected at 3 minutes. The patient had chest discomfort that she described  as a squeezing anterior discomfort radiating to her left arm that was  present prior to adenosine infusion that worsened with adenosine infusion  and improved in recovery.  She was ultimately given two sublingual  nitroglycerin with resolution of chest discomfort.   EKG revealed frequent PVCs, a few missed atrial beats, no ischemic changes  were noted.   Final images and results are pending MD review.      Jae Dire, P.A. [facility]      Vida Roller, M.D.  Electronically Signed    AB/MEDQ  D:  06/13/2005  T:  06/13/2005  Job:  [id number]

## 2010-05-25 NOTE — Consult Note (Signed)
[name], [name]                ACCOUNT NO.:  [id number]   MEDICAL RECORD NO.:  [id number]          PATIENT TYPE:  INP   LOCATION:  2031                         FACILITY:  [facility]   PHYSICIAN:  Salvatore Decent. Cornelius Moras, M.D. DATE OF BIRTH:  03/15/1925   DATE OF CONSULTATION:  06/21/2005  DATE OF DISCHARGE:                                   CONSULTATION   REASON FOR CONSULTATION:  Left main disease.   HISTORY OF PRESENT ILLNESS:  [name] is a 77 year old white female from  [place] with no previous history of cardiac disease and risk factors  notable primarily for history of hyperlipidemia and previous history of  stroke.  The patient was recently referred to Dr. Vida Roller with  symptoms of atypical chest pain.  The patient describes symptoms of  substernal chest pressure sometimes radiating to her left shoulder and left  arm and sometimes associated with shortness of breath.  These symptoms have  come and gone sporadically for the last several months.  They do not  necessarily seem to be related to physical activity, although the patient  does report that she definitely gets these symptoms when she tries to go up  and down stairs.  Nothing seems to make the pain better, although  occasionally she has thought perhaps Tylenol helped.  The symptoms seems to  have waxed and waned for the last several months.  Ultimately the patient  presented for a routine follow-up exam at Dr. Susa Day office and she was  subsequently referred to Dr. Dorethea Clan who evaluated her on June 1.  She  underwent a stress Myoview exam which was abnormal and notable for  reproduction of symptoms of chest pain as well as nuclear findings  suggestive of anteroseptal ischemia.  The patient also had a 2-D  echocardiogram performed [facility] that reportedly demonstrated normal  left ventricular function with moderate mitral regurgitation and moderate  tricuspid regurgitation.  The patient was brought in for  elective cardiac  catheterization today by Dr. Dorethea Clan.  The patient was found to have 60%  ostial stenosis of the left main coronary artery with calcification and 60%  proximal stenosis of the left anterior descending coronary artery.  Left  ventricular function and mitral regurgitation were not assessed at the time  of catheterization.  Cardiac surgical consultation has been requested.   REVIEW OF SYSTEMS:  GENERAL:  The patient reports normal appetite.  She has  not been gaining or losing weight recently.  RESPIRATORY:  Negative.  The  patient denies productive cough, hemoptysis, wheezing.  CARDIAC:  Notable  for symptoms of chest pain that are somewhat atypical but also somewhat  suggestive of angina.  The patient denies resting shortness of breath.  The  patient does have shortness of breath with episodes of chest pain.  The  patient denies orthopnea.  The patient had some mild intermittent lower  extremity edema recently.  She has not had any palpitations.  She does have  frequent dizzy spells and this has been going on for more than one year.  They are not associated with palpitations.  GASTROINTESTINAL:  Notable for  the absence any difficulty swallowing.  The patient denies hematochezia,  hematemesis, melena.  The patient occasionally has problems with diarrhea  and occasionally problems with constipation.  The patient had colonoscopy  with removal of a benign polyp approximately three years ago.  MUSCULOSKELETAL:  Notable for mild arthritis.  NEUROLOGIC:  Notable for  history of previous stroke x2 in 1993.  This has left the patient with some  mild weakness involving her left lower extremity.  Recently she has been  walking with a cane.  She denies any recent episodes of transient numbness  or weakness involving either upper or lower extremity.  The patient denies  transient visual disturbances or transient monocular blindness.  The patient  has had frequent dizzy spells for which  she has been evaluated and treated  for presumptive vertigo.  GENITOURINARY:  Notable for some hematuria as well  as occasional urinary frequency.  The patient was told she had a bladder  infection not too long ago and this was reportedly treated medically.  INFECTIOUS:  Notable for the absence of any recent fevers or chills.  HEMATOLOGIC:  Notable for a history of anemia.  ENDOCRINE:  Negative.  PSYCHIATRIC:  Negative.  HEENT:  Notable for history of TMJ for which the  patient has had pain in her right jaw radiating to her ear and this has  reportedly been thought to possibly be associated with the patient's  symptoms of vertigo.   PAST MEDICAL HISTORY:  Notable for the absence of any documented previous  history of coronary artery disease, myocardial infarction, or congestive  heart failure.  The patient denies rheumatic heart disease.  The patient  denies known history of hypertension.  The patient has undergone two  previous strokes in 1993, the first January and the second in July of that  year.  The patient is treated for hyperlipidemia.  The patient is reported  to have mild to moderate chronic obstructive pulmonary disease.  The patient  has a history of remote tobacco use, having quit smoking many years ago.   PAST SURGICAL HISTORY:  1.  Hysterectomy.  2.  Appendectomy.  3.  Umbilical hernia repair.  4.  Tonsillectomy.  5.  Bilateral cataract extraction.   FAMILY HISTORY:  Notable for the absence of premature coronary artery  disease or valvular heart disease.   SOCIAL HISTORY:  The patient is married and lives with her husband and one  of her sons in [place].  She is retired having previously worked an  Production designer, theatre/television/film.  The patient has a remote history of tobacco use, having quit  smoking many years ago.  The patient denies significant alcohol consumption.   MEDICATIONS PRIOR TO ADMISSION:  Zyrtec 10 mg daily, aspirin 325 mg daily, Valium 5 mg twice daily, calcium 1  tablet daily, Glycolax as needed, fiber  tablet as needed, vitamin B12 once daily, Midodrine 5 mg twice daily.   DRUG ALLERGIES:  PENICILLIN causes rash and severe swelling, KEFLEX causes  rash and swelling, SULFA causes rash.  The patient also reports history of  IV CONTRAST allergy and she was given steroids prior to her catheterization  today.   PHYSICAL EXAMINATION:  GENERAL:  A well-appearing female who appears her stated age in no acute  distress.  VITAL SIGNS:  Blood pressure is 130/70, pulse 57 and regular.  HEENT:  Exam is grossly unremarkable.  NECK:  Supple.  There is no cervical or supraclavicular lymphadenopathy.  There  is no jugular venous distension.  No carotid bruits are noted.  CHEST:  Auscultation of the chest demonstrates clear symmetrical breath  sounds bilaterally.  No wheezes or rhonchi noted.  CARDIOVASCULAR:  Exam  demonstrates regular rate and rhythm.  I do not appreciate a murmur on exam  today.  ABDOMEN:  Bowel sounds present.  There are no palpable masses.  EXTREMITIES:  Warm and well-perfused.  There is no lower extremity edema.  Distal pulses are thready but palpable in both lower legs and the posterior  tibial and dorsalis pedis positions.  RECTAL AND GU:  Exams are both deferred.  NEUROLOGIC:  Examination is grossly nonfocal.  SKIN:  The skin is clean, dry, and healthy-appearing throughout.   DIAGNOSTIC TEST:  Cardiac catheterization performed by Dr. Dorethea Clan today is  reviewed.  This demonstrates ostial stenosis of the left main coronary  artery which is calcified.  This does not appear to be high grade but is  probably 60% stenosis.  There also appears to be tubular 60% proximal  stenosis of the left anterior descending coronary artery.  The right  coronary artery is somewhat small but there also appears to be tubular  stenosis of the proximal right coronary artery, perhaps 30-50%.  Left  ventricular function was not assessed.   IMPRESSION:  Left  main disease with reported history of normal left  ventricular function and possible significant mitral regurgitation.  The  patient's severity of left main disease does not appear critical on today's  catheterization, but the patient's symptoms are certainly worrisome for the  possibility of angina and the patient had a recent stress test that was  clearly abnormal.  Under the circumstances, I agree that Ms. Rapaport would  probably best be treated by coronary artery bypass grafting.  We await plans  for transesophageal echocardiogram to reassess the underlying severity of  mitral and/or tricuspid regurgitation to see whether concomitant valve  repair would be indicated or necessary.      Salvatore Decent. Cornelius Moras, M.D.  Electronically Signed     CHO/MEDQ  D:  06/21/2005  T:  06/21/2005  Job:  [id number]   cc:   Vida Roller, M.D.  Fax: [phone]   Patrica Duel, M.D.  Fax: [phone]

## 2010-05-25 NOTE — Op Note (Signed)
[name], [name]                          ACCOUNT NO.:  [id number]   MEDICAL RECORD NO.:  [id number]                   PATIENT TYPE:  AMB   LOCATION:  DAY                                  FACILITY:  [facility]   PHYSICIAN:  R. Roetta Sessions, M.D.              DATE OF BIRTH:  September 11, 1925   DATE OF PROCEDURE:  DATE OF DISCHARGE:                                 OPERATIVE REPORT   PROCEDURE PERFORMED:  Colonoscopy and biopsy.   INDICATIONS FOR PROCEDURE:  The patient is a 77 year old lady with  intermittent chronic constipation, referred for screening colonoscopy by Dr.  Nobie Putnam.  She underwent a colonoscopy some nine years ago for hematochezia  and was found to have internal hemorrhoids.  She did well.  There is no  family history of colorectal neoplasia.  She takes Citrucel regularly with  good bowel results.  Colonoscopy is now being done as a standard screening.  The procedure was discussed with the patient with its potential risks, and  its alternatives have been reviewed, questions answered.  She is agreeable.  ASA.   DESCRIPTION OF PROCEDURE:  O2 saturation, blood pressure, pulse oximetry  were monitored throughout the entire procedure.  The patient received  conscious sedation, Versed 2 mg IV, Demerol 50 mg IV.   INSTRUMENT:  Olympus video adult colonoscope.   FINDINGS:  Digital rectal exam revealed no abnormalities.  Endoscopic  findings:  The prep was marginal on the right side, fair more distally.  Rectal and colonic examination:  The rectal mucosa including retroflexion of  the anal verge revealed some internal hemorrhoids and a 5 mm polyp at 4 cm.  Colon:  The colonic mucosa was surveyed from the rectosigmoid junction  through the left, transverse, right colon to the area of the appendiceal  orifice, ileocecal valve and cecum.  These structures were well seen and  photographed for the record.  The patient was noted to have left-sided  diverticula.  The remainder of the  colonic mucosa to the cecum appeared  normal from the level of the cecum and ileocecal valve.  The scope was  slowly and cautiously withdrawn.  All previously mentioned mucosal surfaces  were again seen.  No other abnormalities were observed.  The little polyp in  the rectum was cold biopsied/removed.  The patient tolerated the procedure  well, was reactive at endoscopy.   IMPRESSION:  Internal hemorrhoids, small polyp in the rectum, cold  biopsied/removed.  The remainder of the rectal mucosa appeared normal.  Left-  sided diverticula.  The colonic mucosa appeared normal.    RECOMMENDATIONS:  1. Follow up on pathology.  2. Diverticulosis literature given to the patient.  3. Further recommendations to follow.  Jonathon Bellows, M.D.    RMR/MEDQ  D:  02/08/2002  T:  02/08/2002  Job:  [id number]   cc:   Shannan Harper, M.D.  [facility]   Patrica Duel, M.D.  [address], [address]  [place]  [place] [address]  Fax: [phone]

## 2010-05-25 NOTE — Assessment & Plan Note (Signed)
[facility]                       [facility] OFFICE NOTE   NAME:FLOYDAnneka, Studer                       MRN:          [id number]  DATE:04/14/2006                            DOB:          [birth year]    IDENTIFICATION:  Ms. Ramnath is a 77 year old woman who I saw back on  March 6.  She had an episode of falling three times, did not sound like  syncope.  History was a little unclear.   Based on this I set her up for an event monitor.  She wore this for a  month.  It showed sinus rhythm, sinus tachycardia with occasional PVCs  which were not all symptomatic.  There was one short burst of probable  PAT, 6 beats that was asymptomatic.   The patient has been seen by Dr. Gerilyn Pilgrim in the interim.  I do not know  what is going on there.  She does say she had a chest cold last week, is  breathing better now, was short of breath last week but now back to  baseline.  She is now on antibiotics also for a UTI.   CURRENT MEDICATIONS:  1. Calcium with D.  2. Vitamin C.  3. Aspirin 81 mg daily.  4. B12 daily.  5. Ginkgo biloba daily.  6. Toprol XL 25 daily.  7. Lipitor 10 daily.  8. Fish oil.  9. Multivitamin.  10.Tramadol 50 b.i.d.  11.Midodrine 5 b.i.d.  12.Bronkaid daily.  13.Boniva 150 q. month.  14.Urochrome 2 tablets t.i.d.  15.Ciprofloxacin 500 b.i.d.  16.Zyrtec daily.   PHYSICAL EXAMINATION:  The patient in no distress.  Blood pressure is 138/70, pulse is 76 and regular.  Weight 122.  NECK:  JVP is normal.  No thyromegaly.  LUNGS:  Are clear. No rales or wheezes.  CARDIAC:  Regular rate and rhythm.  S1, S2.  No S3, no murmurs.  ABDOMEN:  Is benign.  EXTREMITIES:  No edema.   IMPRESSION:  1. Falling.  I am not sure what this represented but she is now being      seen in neurology and I cannot explain with a cardiac etiology.      She was not orthostatic on last visit.  Continue on current      regimen.  2. Coronary artery disease -  stable, would follow.  3. Dyslipidemia.  4. Last lipid panel in January showed great control with an HDL of 57,      LDL of 62.  5. We will set to see the patient back in the fall, sooner if problems      develop.     Pricilla Riffle, MD, [facility]  Electronically Signed    PVR/MedQ  DD: 04/14/2006  DT: 04/14/2006  Job #: [id number]   cc:   Patrica Duel, M.D.  Kofi A. Gerilyn Pilgrim, M.D.

## 2010-06-15 ENCOUNTER — Other Ambulatory Visit: Payer: Self-pay | Admitting: Cardiology

## 2010-06-16 LAB — BASIC METABOLIC PANEL
CO2: 27 mEq/L (ref 19–32)
Calcium: 9.9 mg/dL (ref 8.4–10.5)
Creat: 0.82 mg/dL (ref 0.50–1.10)

## 2010-06-20 ENCOUNTER — Encounter: Payer: Self-pay | Admitting: *Deleted

## 2010-12-26 ENCOUNTER — Other Ambulatory Visit (HOSPITAL_COMMUNITY): Payer: Self-pay | Admitting: Family Medicine

## 2010-12-26 ENCOUNTER — Ambulatory Visit (HOSPITAL_COMMUNITY)
Admission: RE | Admit: 2010-12-26 | Discharge: 2010-12-26 | Disposition: A | Discharge status: Home / Self Care | Payer: Medicare Other | Source: Ambulatory Visit | Attending: Family Medicine | Admitting: Family Medicine

## 2010-12-26 DIAGNOSIS — M25559 Pain in unspecified hip: Secondary | ICD-10-CM | POA: Insufficient documentation

## 2010-12-26 DIAGNOSIS — M79609 Pain in unspecified limb: Secondary | ICD-10-CM

## 2011-01-28 ENCOUNTER — Other Ambulatory Visit (HOSPITAL_COMMUNITY): Payer: Self-pay | Admitting: Family Medicine

## 2011-01-28 DIAGNOSIS — M169 Osteoarthritis of hip, unspecified: Secondary | ICD-10-CM

## 2011-01-28 DIAGNOSIS — M25559 Pain in unspecified hip: Secondary | ICD-10-CM

## 2011-01-31 ENCOUNTER — Ambulatory Visit (HOSPITAL_COMMUNITY)
Admission: RE | Admit: 2011-01-31 | Discharge: 2011-01-31 | Disposition: A | Discharge status: Home / Self Care | Payer: Medicare Other | Source: Ambulatory Visit | Attending: Family Medicine | Admitting: Family Medicine

## 2011-01-31 DIAGNOSIS — M25559 Pain in unspecified hip: Secondary | ICD-10-CM

## 2011-01-31 DIAGNOSIS — M79609 Pain in unspecified limb: Secondary | ICD-10-CM | POA: Insufficient documentation

## 2011-01-31 DIAGNOSIS — M169 Osteoarthritis of hip, unspecified: Secondary | ICD-10-CM

## 2011-02-19 ENCOUNTER — Encounter: Payer: Self-pay | Admitting: Orthopedic Surgery

## 2011-02-19 ENCOUNTER — Ambulatory Visit (INDEPENDENT_AMBULATORY_CARE_PROVIDER_SITE_OTHER): Payer: Medicare Other | Admitting: Orthopedic Surgery

## 2011-02-19 VITALS — BP 142/80 | Ht 64.0 in | Wt 116.0 lb

## 2011-02-19 DIAGNOSIS — M5137 Other intervertebral disc degeneration, lumbosacral region: Secondary | ICD-10-CM

## 2011-02-19 DIAGNOSIS — G5790 Unspecified mononeuropathy of unspecified lower limb: Secondary | ICD-10-CM

## 2011-02-19 DIAGNOSIS — M5136 Other intervertebral disc degeneration, lumbar region: Secondary | ICD-10-CM

## 2011-02-19 DIAGNOSIS — M51379 Other intervertebral disc degeneration, lumbosacral region without mention of lumbar back pain or lower extremity pain: Secondary | ICD-10-CM

## 2011-02-19 MED ORDER — GABAPENTIN 100 MG PO CAPS: 100.0000 mg | ORAL_CAPSULE | Freq: Three times a day (TID) | ORAL | Status: DC

## 2011-02-19 NOTE — Progress Notes (Signed)
Patient ID: [name], female   DOB: [birth year], 78 y.o.   MRN: [id number] 3 VIEWS L SPINE  HIP/BACK PAIN  ALIGNMENT: SCOLIOSIS MILD  DISCS NARROWING OF L1-2  LUMBAR LORDOSIS NORMAL  BONE FOR CANCE NORMAL   IMPR: DDD LSPINE  Subjective:    [name] is a 78 y.o. female who presents with left hip pain. Onset of the symptoms was 6 mos ago. Inciting event: none. The patient reports the hip pain is worse with weight bearing, is aggravated by walking, is better with laying down and radiates to left lower leg. Aggravating symptoms include: any weight bearing, standing and walking. Patient has had no prior hip problems. Previous visits for this problem: none. Evaluation to date: plain films, which were normal for the left hip joint. Treatment to date: none.  The following portions of the patient's history were reviewed and updated as appropriate: allergies, current medications, past family history, past medical history, past social history, past surgical history and problem list.   Review of Systems A comprehensive review of systems was negative except for: Respiratory: positive for cough, dyspnea on exertion and wheezing Cardiovascular: positive for valvular disease  Neurological: positive for dizziness   Objective:    BP 142/80  Ht 5\' 4"  (1.626 m)  Wt 116 lb (52.617 kg)  BMI 19.91 kg/m2  Physical Exam(12) GENERAL: normal development   CDV: pulses are normal   Skin: normal  Lymph: nodes were not palpable/normal  Psychiatric: awake, alert and oriented  Neuro: normal sensation  Upper extremity exam  Inspection and palpation revealed no abnormalities in the upper extremities.  Range of motion is full without contracture.  Motor exam is normal with grade 5 strength.  The joints are fully reduced without subluxation.  There is no atrophy or tremor and muscle tone is normal.  All joints are stable.  Right hip: full painless range of motion, without tenderness  Left  hip: full painless range of motion, without tenderness   L spine tendernss left side and lower leg L5, SLR S NEGATIVE   Imaging: X-ray the left hip: no fracture, dislocation, swelling or degenerative changes noted   L SPINE FILM L1-2 DISC SPACE NARROWING    Assessment:    DDD L SPINE    Plan:    Natural history and expected course discussed. Questions answered. PT referral.

## 2011-02-19 NOTE — Patient Instructions (Signed)
CALL HOSPITAL FOR THERAPY

## 2011-02-25 ENCOUNTER — Ambulatory Visit (HOSPITAL_COMMUNITY)
Admission: RE | Admit: 2011-02-25 | Discharge: 2011-02-25 | Disposition: A | Discharge status: Home / Self Care | Payer: Medicare Other | Source: Ambulatory Visit | Attending: Orthopedic Surgery | Admitting: Orthopedic Surgery

## 2011-02-25 DIAGNOSIS — IMO0001 Reserved for inherently not codable concepts without codable children: Secondary | ICD-10-CM | POA: Insufficient documentation

## 2011-02-25 DIAGNOSIS — E785 Hyperlipidemia, unspecified: Secondary | ICD-10-CM | POA: Insufficient documentation

## 2011-02-25 DIAGNOSIS — M79609 Pain in unspecified limb: Secondary | ICD-10-CM | POA: Insufficient documentation

## 2011-02-25 NOTE — Evaluation (Signed)
Physical Therapy Evaluation  Patient Details  Name: [name] MRN: [id number] Date of Birth: [birth year]  Today's Date: 02/25/2011 Time: 7829-5621 Time Calculation (min): 55 min Charges: 1 eval, 1 heat  Visit#: 1  of 12   Re-eval: 03/27/11 Assessment Diagnosis: Lumbar DDD Prior Therapy: For cervical pain  Past Medical History:  Past Medical History  Diagnosis Date  . ASCVD (arteriosclerotic cardiovascular disease)     CABG surgery in 06/2005 for left main disease; normal EF; negative stress nuclear 08/2008  . Cerebrovascular disease     1993-CVA; 08/2008-mild atherosclerosis without focal stenosis  . Syncope      Negative even recorder dated 3/08  . Tobacco abuse     60 pack years; continuing  . Orthostatic hypotension   . Diverticulosis   . Osteopenia   . GERD (gastroesophageal reflux disease)    Past Surgical History:  Past Surgical History  Procedure Date  . Abdominal hysterectomy 1990  . Coronary artery bypass graft 2007    Left main disease-2007  . Ventral hernia repair   . Appendectomy   . Tonsillectomy     Subjective Symptoms/Limitations Symptoms: Pt reports that her L leg has been painful for about a year and has had to start using a cane because of it.  In the past year she has fallen at least 6x.  She reports that she usually falls in her house but has fallen outside of her house.  She lives with her husband and 2 sons.  Her daughter also visits frequnently.  Her c/co is pain in her L leg.  She would also like to be able to walk independently.  How long can you sit comfortably?: 60-90 minutes then needs to move How long can you stand comfortably?: 5-10 minutes How long can you walk comfortably?: 30 minutes with her quad cane.  Pain Assessment Currently in Pain?: Yes Pain Score:   7 Pain Location: Back Pain Orientation: Left Pain Type: Neuropathic pain Effect of Pain on Daily Activities: Not able to walk for exercise, difficulty doing her housework,  getting groceries, getting into and out of her car.  Needs to sit on a stool to shower and dress.   Prior Functioning  Home Living Lives With: Spouse;Family;Son Prior Function Leisure: Hobbies-yes (Comment) Comments: Enjoys being with her family including grandchildren and great grandchildren  Cognition/Observation Cognition Attention: Sustained  Sensation/Coordination/Flexibility/Functional Tests Flexibility Hoovers: Positive 90/90: Positive  Assessment LLE Strength LLE Overall Strength Comments: Pt is overall a 3/5.  has difficulty lifiting her leg up and down from the table.  Able to complete full ROM on EOB Left Hip Extension: 2/5 Left Hip ABduction: 2/5 Lumbar AROM Lumbar Flexion: Decreased 50% - reports increased pain Lumbar Extension: Decreased 75% - reports mild pain Lumbar - Right Side Bend: Normal Lumbar - Left Side Bend: Normal Palpation Palpation: increased spams throughout L lumbosacral musculature, L pirifromis and gluteus medius reigon.   Exercise/Treatments Mobility/Balance  Ambulation/Gait Ambulation/Gait: Yes Ambulation/Gait Assistance: 6: Modified independent (Device/Increase time) Gait Pattern: Step-through pattern;Decreased stance time - left;Antalgic;Lateral hip instability;Decreased trunk rotation Posture/Postural Control Posture/Postural Control: Postural limitations Postural Limitations: mild kyphosis   Stretches Active Hamstring Stretch: Limitations Active Hamstring Stretch Limitations: Nerve glides to hip and knee x5 each; Quad Nerve glides prone x10 to knee only. Single Knee to Chest Stretch: 30 seconds;Other (comment) (Left) Press Ups: 1 rep;60 seconds Stability Clam: 10 reps Bridge: Supine;5 reps Hip Abduction: 10 reps  Physical Therapy Assessment and Plan PT Assessment and Plan Clinical Impression  Statement: Pt is an 78 year old female referred to PT secondary to lumbar DDD with significant L leg pain.  After examiniation it was  found that she has current body structure impairments including decreased L LE and core strength, increased spasms, decreased lumbar AROM, decreased LE flexibility and difficulty walking which is limiting her ability to participate in community and household activities.  Pt will benefit from skilled outpatient PT in order to address the above impairments in order to maximize independence and reach functional goals.  Rehab Potential: Good PT Frequency: Min 3X/week PT Duration: 4 weeks PT Treatment/Interventions: Gait training;Stair training;Functional mobility training;Therapeutic activities;Therapeutic exercise;Balance training;Neuromuscular re-education;Patient/family education;Other (comment) (manual and modalities (including lumbar traction) for pain) PT Plan: Add: core stability exercises, prone exercises, LE stretching and nerve glides as tolerated, Heat and E-stim to low back, manual therapy to decrease spasms.    Goals Home Exercise Program Pt will Perform Home Exercise Program: Independently PT Short Term Goals Time to Complete Short Term Goals: 2 weeks PT Short Term Goal 1: Pt will report pain less than or equal to a 4/10 for 50% of her day.  PT Short Term Goal 2: Pt will improve lumbar ROM by 25% for flexion and extension.  PT Short Term Goal 3: Pt will improve core strength by 1 muscle grade.  PT Long Term Goals Time to Complete Long Term Goals: 4 weeks PT Long Term Goal 1: Pt will improve core and LE strength in order to tolerate walking and standing for 30 minutes to complete household activities.  PT Long Term Goal 2: Pt will improve lumbar ROM to [facility] in order to tolerate sitting for 90 minutes without an increase in pain.  Long Term Goal 3: Pt will report pain less than or eqaul to 3/10 for improved quality of life.   Long Term Goal 4: Pt will improve her LE strength and endurance to tolerate ambulating x10 minutes independently to continue with exercise routine.   Problem  List Patient Active Problem List  Diagnoses  . HYPERLIPIDEMIA  . TOBACCO ABUSE  . Orthostatic hypotension  . GERD  . DIVERTICULOSIS, ASYMPTOMATIC  . OSTEOPENIA  . ASCVD (arteriosclerotic cardiovascular disease)  . Cerebrovascular disease  . Syncope    PT - End of Session Activity Tolerance: Patient tolerated treatment well General Behavior During Session: [facility] for tasks performed PT Plan of Care PT Home Exercise Plan: Instructed patient on HEP.  Pt agreeable to treatment plan. Consulted and Agree with Plan of Care: Patient   [name] 02/25/2011, 2:08 PM  Physician Documentation Your signature is required to indicate approval of the treatment plan as stated above.  Please sign and either send electronically or make a copy of this report for your files and return this physician signed original.   Please mark one 1.__approve of plan  2. ___approve of plan with the following conditions.   ______________________________                                                          _____________________ Physician Signature  Date  

## 2011-02-27 ENCOUNTER — Ambulatory Visit (HOSPITAL_COMMUNITY)
Admission: RE | Admit: 2011-02-27 | Discharge: 2011-02-27 | Disposition: A | Discharge status: Home / Self Care | Payer: Medicare Other | Source: Ambulatory Visit | Attending: Family Medicine | Admitting: Family Medicine

## 2011-02-27 NOTE — Progress Notes (Signed)
Physical Therapy Treatment Patient Details  Name: ALETA MANTERNACH MRN: [id number] Date of Birth: 02/12/1925  Today's Date: 02/27/2011 Time: 4098-1191 Time Calculation (min): 57 min Charges: 30' manual. 10' TE, 1 e-stim, 1 heat Visit#: 2  of 12   Re-eval: 03/27/10    Subjective: Symptoms/Limitations Symptoms: Pt reports that her legs are a little sore and she has been doing her HEP.  Pain Assessment Currently in Pain?: Yes Pain Score:   3 Pain Location: Leg Pain Orientation: Left Exercise/Treatments Stretches Active Hamstring Stretch: 3 reps;30 seconds Active Hamstring Stretch Limitations: Nerve Glides to Hamstring: 1x10 RLE, 3x10 LLE w/ Single Knee to Chest Stretch: 3 reps;30 seconds;Limitations Single Knee to Chest Stretch Limitations: Nerve Glides 3x10 to LLE w/SKTC stretch afterwards, Nerve Glides to RLE x10 Hip Flexor Stretch: Limitations Hip Flexor Stretch Limitations: Nerve Glides: 10x RLE, 3x10 LLE; Hip flexor stretch in prone done manually:1x RLE;  x15 sec; LLE 3x30 sec after nerve glides Press Ups: 2 reps;30 seconds Quadruped Mid Back Stretch: 3 reps;20 seconds Quad Stretch: Limitations Quad Stretch Limitations: Prone quad nerve glides performed in prone: 1x10 RLE, 3x10 LLE,Performed manullay after nerve glidesRLE 1x 30 sec; LLE 3x30 sec after nerve glides  Stability Single Arm Raise: Right;Left;Prone;10 reps Leg Raise: Prone;Right;Left;10 reps  Modalities Modalities: Electrical Stimulation Manual Therapy Manual Therapy: Other (comment) Other Manual Therapy: SCS to L psoas major and TFL region with 50% release; CTR/STM to Lower left leg/ankle region.  Pharmacologist Location: Lumbosacral region Statistician Action: IFES w/heat Electrical Stimulation Parameters: IFES 80/150 x15 minutes  Physical Therapy Assessment and Plan PT Assessment and Plan Clinical Impression Statement: Pt had notable decrease in muscular spams after  manual and mechanical techniques.   Pt had improved LLE hip and knee ROM after nerve glide techniques.  Notable muscular fatigue with prone ther-ex and decreased L sided L1-L4 multifidus activitaion which is likey due to increased spasm to L1-L4 region.  Pt had improved pelvic action and decreased pain with mobility, however continues to demonstrate difficulty with balance.  Added  prone: SAR, SLR, mid back stretch today. PT Plan: F/U on e-stim treatment.  Cont to decrease lumbar spams, decrease L LE pain and improve nerve length with nerve glides.  Progress core strength as necessary     Problem List Patient Active Problem List  Diagnoses  . HYPERLIPIDEMIA  . TOBACCO ABUSE  . Orthostatic hypotension  . GERD  . DIVERTICULOSIS, ASYMPTOMATIC  . OSTEOPENIA  . ASCVD (arteriosclerotic cardiovascular disease)  . Cerebrovascular disease  . Syncope      Jericho Alcorn, PT 02/27/2011, 10:43 AM

## 2011-03-01 ENCOUNTER — Ambulatory Visit (HOSPITAL_COMMUNITY): Payer: Medicare Other | Admitting: Physical Therapy

## 2011-03-01 ENCOUNTER — Telehealth (HOSPITAL_COMMUNITY): Payer: Self-pay | Admitting: Physical Therapy

## 2011-03-04 ENCOUNTER — Ambulatory Visit (HOSPITAL_COMMUNITY)
Admission: RE | Admit: 2011-03-04 | Discharge: 2011-03-04 | Disposition: A | Discharge status: Home / Self Care | Payer: Medicare Other | Source: Ambulatory Visit | Attending: Orthopedic Surgery | Admitting: Orthopedic Surgery

## 2011-03-04 NOTE — Progress Notes (Signed)
Physical Therapy Treatment Patient Details  Name: [name] MRN: [id number] Date of Birth: 1925/07/20  Today's Date: 03/04/2011 Time: 1110-1205 Time Calculation (min): 55 min Charges: 46' TE, 8' Manual, 1 esitm, 1 heat Visit#: 3  of 12   Re-eval: 03/27/11    Subjective: Symptoms/Limitations Symptoms: "Whatever we did last time seemed to really help.  I am not hurting as much today." Pain Assessment Currently in Pain?: Yes Pain Score:   4 Pain Location: Leg  Precautions/Restrictions     Exercise/Treatments Mobility/Balance        Stretches   Lumbar Exercises   Stability Single Arm Raise: Left;10 reps Leg Raise: Right;20 reps Opposite Arm/Leg Raise: Left arm/Right leg;10 reps Functional Squats: 15 reps;Limitations Functional Squats Limitations: Tactile Cueing for appropriate motion Heel Raises: 15 reps Machine Exercises    Balance Exercises Tandem Walking: 2 round trips Retro Gait: 2 round trips Tandem Stance: Right;Left;Limitations Tandem Stance Limitations: 3x30 sec BLE; Tandem balance with 4 in step 3x30 BLE Heel Raises: 15 reps Toe Raise: 15 reps Balance Poses   Manual Therapy Other Manual Therapy: CTR to Left leg and ankle x8 minutes during e-stim. Pharmacologist Location: Thoracolumbar region  Statistician Action: IFES w/heat x15 minutes Electrical Stimulation Parameters: IFES 80/150 x15 minutes  Electrical Stimulation Goals: Pain  Physical Therapy Assessment and Plan PT Assessment and Plan Clinical Impression Statement: Today's treatment focus on improving overall balance to improve ankle strategy and decrease LE pain.  Continues to have fascial restricition to L LE which is likely due to impaired ankle strategy and is having centralizing symptoms.  She demonstrates difficulty with coordinating LUE and RLE trunk stabilization ther-ex and therefore decreased L multifidus activiation.  PT Plan: Cont to  improve overall balance (add side stepping, cone rotations), decrease L LE pain.  May D/C e-stim next treamtent if feel it is necessary.     Goals    Problem List Patient Active Problem List  Diagnoses  . HYPERLIPIDEMIA  . TOBACCO ABUSE  . Orthostatic hypotension  . GERD  . DIVERTICULOSIS, ASYMPTOMATIC  . OSTEOPENIA  . ASCVD (arteriosclerotic cardiovascular disease)  . Cerebrovascular disease  . Syncope    PT - End of Session Activity Tolerance: Patient tolerated treatment well   [name] Moder 03/04/2011, 12:13 PM

## 2011-03-06 ENCOUNTER — Ambulatory Visit (HOSPITAL_COMMUNITY)
Admission: RE | Admit: 2011-03-06 | Discharge: 2011-03-06 | Disposition: A | Discharge status: Home / Self Care | Payer: Medicare Other | Source: Ambulatory Visit | Attending: Orthopedic Surgery | Admitting: Orthopedic Surgery

## 2011-03-06 NOTE — Progress Notes (Signed)
Physical Therapy Treatment Patient Details  Name: [name] MRN: [id number] Date of Birth: 1925/06/19  Today's Date: 03/06/2011 Time: 1120-1213 Time Calculation (min): 53 min Visit#: 4  of 12   Re-eval: 03/27/11  Charge: therex 20 min NMR 23 min Manual 10 min IFES/MHP 10 min  Subjective:  Pt ambulating with no AD, stated first day walking out in [name] with no SPC. Pt reported no falls and no pain    Objective:   Exercise/Treatments Stability Single Arm Raise: Prone;15 reps Leg Raise: Prone;20 reps Opposite Arm/Leg Raise: Left arm/Right leg;10 reps Functional Squats: 15 reps;Limitations Functional Squats Limitations: Manual assistance for appropriate motion Heel Raises: 20 reps  Balance Exercises Sidestepping: Theraband;2 round trips;Limitations Theraband Level (Sidestepping): Level 4 (Blue) Sidestepping Limitations: 1RT sidestepping/ 1RT with tband Tandem Walking: 2 round trips Retro Gait: 2 round trips Step Over Cones (Round Trips): 1 RT each direction Single Limb Stance: Limitations Single Limb Stance Limitations: 30" with intermittent HHA Tandem Stance: Right;Left;Limitations Tandem Stance Limitations: 3x30 sec BLE; Tandem balance with 4 in step 3x30 BLE Heel Raises: 20 reps Toe Raise: 20 reps Modalities Modalities: Moist Heat;Electrical Stimulation Manual Therapy Other Manual Therapy: CTR to Left leg and ankle x8 minutes during e-stim Moist Heat Therapy Number Minutes Moist Heat: 10 Minutes Moist Heat Location:  (back) Museum/gallery exhibitions officer Stimulation Location: Thoracolumbar region  Electrical Stimulation Action: IFES w/heat x10 minutes Electrical Stimulation Parameters: IFES 80/150 x10 minutes  Electrical Stimulation Goals: Pain  Physical Therapy Assessment and Plan PT Assessment and Plan Clinical Impression Statement: Began new balance exercises per PT plan.  Pt with good balance ankle strategy noted with new activity.  Pt did state  increase LBP with shoulder movements in prone exercise and L knee pain to 4-5/10 with squats Continued with  IFES to thoracic region and STM to L calf at end of session, pt stated pain resolved  PT Plan: Continue to improve overall balance amd core strengthening, pain reduction to L LE.  D/C estim if it is not needed.    Goals    Problem List Patient Active Problem List  Diagnoses  . HYPERLIPIDEMIA  . TOBACCO ABUSE  . Orthostatic hypotension  . GERD  . DIVERTICULOSIS, ASYMPTOMATIC  . OSTEOPENIA  . ASCVD (arteriosclerotic cardiovascular disease)  . Cerebrovascular disease  . Syncope    PT - End of Session Activity Tolerance: Patient tolerated treatment well General Behavior During Session: [name] for tasks performed Cognition: [facility] for tasks performed  Juel Burrow, PTA 03/06/2011, 3:21 PM

## 2011-03-08 ENCOUNTER — Ambulatory Visit (HOSPITAL_COMMUNITY)
Admission: RE | Admit: 2011-03-08 | Discharge: 2011-03-08 | Disposition: A | Discharge status: Home / Self Care | Payer: Medicare Other | Source: Ambulatory Visit | Attending: Physical Therapy | Admitting: Physical Therapy

## 2011-03-08 DIAGNOSIS — IMO0001 Reserved for inherently not codable concepts without codable children: Secondary | ICD-10-CM | POA: Insufficient documentation

## 2011-03-08 DIAGNOSIS — M79609 Pain in unspecified limb: Secondary | ICD-10-CM | POA: Insufficient documentation

## 2011-03-08 DIAGNOSIS — E785 Hyperlipidemia, unspecified: Secondary | ICD-10-CM | POA: Insufficient documentation

## 2011-03-08 NOTE — Progress Notes (Signed)
Physical Therapy Treatment Patient Details  Name: [name] MRN: [id number] Date of Birth: 01-24-1925  Today's Date: 03/08/2011 Time: 4098-1191 Time Calculation (min): 38 min Visit#: 5  of 12   Re-eval: 03/27/11  Charge: NMR 23 min therex 5 min STM 10 min  Subjective: Symptoms/Limitations Symptoms: L LE pain 2/10 at entrance, pt stated pain increase with SLS and other balance activities. Pain Assessment Currently in Pain?: Yes Pain Score:   2 Pain Location: Leg Pain Orientation: Left  Objective:   Exercise/Treatments Stability Bridge: 10 reps Straight Leg Raise: Supine;10 reps  Balance Exercises Sidestepping: 2 round trips;Theraband Theraband Level (Sidestepping): Level 4 (Blue) Tandem Walking: 2 round trips Retro Gait: 2 round trips Single Limb Stance: Right;Left;Limitations Single Limb Stance Limitations: L 5" R 7" max of 4 Tandem Stance: Right;Left Tandem Stance Limitations: 3x 30" Balance Poses   Manual Therapy Other Manual Therapy: CTR to Left leg and ankle x10 minutes  Physical Therapy Assessment and Plan PT Assessment and Plan Clinical Impression Statement: Following balance activities pt c/o extreme dizziness.  Stopped standing exercises for the rest of session.  Pt stated increased L LE pain with balance activities.  Dizziness reduced with supine position and pain reduced following STM to L calf.. PT Plan: Assess dizzyness.  Continue with balance training, core strengthening and pain reduction to L LE.  D/C estim.  Begin letter 1-15 on balance beam next session.      Goals    Problem List Patient Active Problem List  Diagnoses  . HYPERLIPIDEMIA  . TOBACCO ABUSE  . Orthostatic hypotension  . GERD  . DIVERTICULOSIS, ASYMPTOMATIC  . OSTEOPENIA  . ASCVD (arteriosclerotic cardiovascular disease)  . Cerebrovascular disease  . Syncope    PT - End of Session Activity Tolerance: Patient tolerated treatment well General Behavior During Session:  [facility] for tasks performed Cognition: [facility] for tasks performed  GP No functional reporting required  Juel Burrow, PTA 03/08/2011, 10:22 AM

## 2011-03-11 ENCOUNTER — Ambulatory Visit (HOSPITAL_COMMUNITY)
Admission: RE | Admit: 2011-03-11 | Discharge: 2011-03-11 | Disposition: A | Discharge status: Home / Self Care | Payer: Medicare Other | Source: Ambulatory Visit | Attending: Physical Therapy | Admitting: Physical Therapy

## 2011-03-11 NOTE — Progress Notes (Signed)
Physical Therapy Treatment Patient Details  Name: [name] MRN: [id number] Date of Birth: August 08, 1925  Today's Date: 03/11/2011 Time: 1018-1100 Time Calculation (min): 42 min Visit#: 6  of 12   Re-eval: 03/27/11 Charges:  NMR 40'    Subjective: Symptoms/Limitations Symptoms: No pain today and without dizziness. Pain Assessment Currently in Pain?: No/denies   Exercise/Treatments Balance Exercises Sidestepping: 2 round trips;Theraband Theraband Level (Sidestepping): Level 4 (Blue) Retro Gait: 2 round trips;Theraband Theraband Level (Retro Gait): Level 4 (Blue) Step Over Cones (Round Trips): hurdles 1 RT each hi/lo Single Limb Stance Limitations: L 8" R 10" max of 4 Tandem Stance: Right;Left Tandem Stance Limitations: 2x 30" Stand without Upper Extremity Support: #1-15 on balance beam Heel Raises: 20 reps Toe Raise: 20 reps Balance Beam: 2RT   Physical Therapy Assessment and Plan PT Assessment and Plan Clinical Impression Statement: Pt. without dizziness today; Able to perform therex with less LOB, hurdles most difficult as weak hip flexors.  Added balance beam and standing # search without difficulty.  Held massage as pt. having no pain today. PT Plan: Add high march and hold and SLS with vectors next visit.     Problem List Patient Active Problem List  Diagnoses  . HYPERLIPIDEMIA  . TOBACCO ABUSE  . Orthostatic hypotension  . GERD  . DIVERTICULOSIS, ASYMPTOMATIC  . OSTEOPENIA  . ASCVD (arteriosclerotic cardiovascular disease)  . Cerebrovascular disease  . Syncope    PT - End of Session Equipment Utilized During Treatment: Gait belt Activity Tolerance: Patient tolerated treatment well General Behavior During Session: [facility] for tasks performed Cognition: [facility] for tasks performed   Amy B. Bascom Levels, PTA 03/11/2011, 11:06 AM

## 2011-03-13 ENCOUNTER — Ambulatory Visit (HOSPITAL_COMMUNITY)
Admission: RE | Admit: 2011-03-13 | Discharge: 2011-03-13 | Disposition: A | Discharge status: Home / Self Care | Payer: Medicare Other | Source: Ambulatory Visit | Attending: Family Medicine | Admitting: Family Medicine

## 2011-03-13 NOTE — Progress Notes (Signed)
Physical Therapy Treatment Patient Details  Name: MARLEA GAMBILL MRN: [id number] Date of Birth: 29-Apr-1925  Today's Date: 03/13/2011 Time: 1100-1200 Time Calculation (min): 60 min Visit#: 7  of 12   Re-eval: 03/27/11  Charge: NMR 40 min STM 10 min Ice 10 min  Subjective: Symptoms/Limitations Symptoms: No pain and no reports of dizziness.  My balance is improving, able to do more at home that I have not done in a long time, getting good results from coming here. Pain Assessment Currently in Pain?: No/denies  Objective:   Exercise/Treatments Balance Exercises Sidestepping: 2 round trips;Theraband Theraband Level (Sidestepping): Level 4 (Blue) Tandem Walking: 2 round trips Retro Gait: 2 round trips;Theraband Step Over Cones (Round Trips): hurdles 2 RT each 6 and 12in March on Foam/Wedge: Limitations March on Foam/Wedge Limitations: high march hold x 2 minutes Single Limb Stance Limitations: L 14", R 8" max of 4 also vector stance 2x 5" holds each direction each LE Tandem Stance: Limitations Tandem Stance Limitations: 4x 30" with no HHA Stand without Upper Extremity Support: #1-15 on balance beam 1 rep each hand Heel Raises: 20 reps Toe Raise: 20 reps Balance Beam: 2RT   Modalities Modalities: Cryotherapy Manual Therapy Other Manual Therapy: CTR to Left leg and ankle x10 minutes  Cryotherapy Number Minutes Cryotherapy: 10 Minutes Cryotherapy Location:  (L leg) Type of Cryotherapy: Ice pack  Physical Therapy Assessment and Plan PT Assessment and Plan Clinical Impression Statement: Pt with no c/o dizziness throughout session.  Began high march hold to address weak hip flexors and vector stance to increase leg stability and improve ankle strategy for improved balance.  Pt did require HHA to reduce risk of falls with new activities and did c/o increase L leg pain at the end following activities.  Pt stated STM and ice helped to reduce leg pain.   PT Plan: Continue with  current POC, progress balance and improve core strength.  Resume mat activities next session.    Goals    Problem List Patient Active Problem List  Diagnoses  . HYPERLIPIDEMIA  . TOBACCO ABUSE  . Orthostatic hypotension  . GERD  . DIVERTICULOSIS, ASYMPTOMATIC  . OSTEOPENIA  . ASCVD (arteriosclerotic cardiovascular disease)  . Cerebrovascular disease  . Syncope    PT - End of Session Equipment Utilized During Treatment: Gait belt Activity Tolerance: Patient tolerated treatment well General Behavior During Session: [facility] for tasks performed Cognition: [facility] for tasks performed  Juel Burrow, PTA 03/13/2011, 11:57 AM

## 2011-03-15 ENCOUNTER — Ambulatory Visit (HOSPITAL_COMMUNITY)
Admission: RE | Admit: 2011-03-15 | Discharge: 2011-03-15 | Disposition: A | Discharge status: Home / Self Care | Payer: Medicare Other | Source: Ambulatory Visit

## 2011-03-15 NOTE — Progress Notes (Signed)
Physical Therapy Treatment Patient Details  Name: [name] MRN: [id number] Date of Birth: 1925-06-22  Today's Date: 03/15/2011 Time: 1110-1208 Time Calculation (min): 58 min Visit#: 8  of 12   Re-eval: 03/27/11  Charge: NMR 28 min therex 10 min Manual 10 min Ice 10 min  Subjective: Symptoms/Limitations Symptoms: L leg bothering me a little more today, pain scale 5/10.  My back is really tired, been doing a lot of house work this morning.   Pain Assessment Currently in Pain?: Yes Pain Score:   5 Pain Location: Leg Pain Orientation: Left;Anterior  Objective:   Exercise/Treatments Stability Clam: Side-lying;10 reps;5 seconds Bridge: Supine;15 reps Straight Leg Raise: Supine;15 reps Hip Abduction: Side-lying;10 reps Heel Raises: 20 reps;Limitations Heel Raises Limitations: toe raises 20 reps  Balance Exercises Tandem Walking: 2 round trips Retro Gait: 2 round trips;Theraband Step Over Cones (Round Trips): hurdles 2 RT alternating 6 and 12in March on Foam/Wedge: Limitations March on Foam/Wedge Limitations: high march hold x 2 minutes Single Limb Stance Limitations: R 13", L8" Tandem Stance: Limitations Tandem Stance Limitations: 4x 30" with no HHA Heel Raises: 20 reps;Limitations Heel Raises Limitations: toe raises 20 reps Toe Raise: 20 reps    Modalities Modalities: Cryotherapy Manual Therapy Other Manual Therapy: CTR to Left leg and ankle x10 minutes  Moist Heat Therapy Number Minutes Moist Heat: 10 Minutes Cryotherapy Number Minutes Cryotherapy: 10 Minutes Cryotherapy Location: Other (comment) (L leg) Type of Cryotherapy: Ice pack  Physical Therapy Assessment and Plan PT Assessment and Plan Clinical Impression Statement: Pt required less assistance with balance activities, pt able to independently correct LOB episdoes with tandem and retro gait with supervision to min assist.  Resumed mat activities, pt able to complete all activities correctly with  visible fatgiue from weak musculature.  Pt stated pain reduced L leg following massage and ice.   PT Plan: Begin TM for endurance and posture, add balance master L8 test for LOS and DS next session.  Standing hip abd/ext and prone activities as tolerated/time allows.    Goals    Problem List Patient Active Problem List  Diagnoses  . HYPERLIPIDEMIA  . TOBACCO ABUSE  . Orthostatic hypotension  . GERD  . DIVERTICULOSIS, ASYMPTOMATIC  . OSTEOPENIA  . ASCVD (arteriosclerotic cardiovascular disease)  . Cerebrovascular disease  . Syncope    PT - End of Session Equipment Utilized During Treatment: Gait belt Activity Tolerance: Patient tolerated treatment well General Behavior During Session: [facility] for tasks performed Cognition: [facility] for tasks performed  Juel Burrow, PTA 03/15/2011, 12:02 PM

## 2011-03-18 ENCOUNTER — Ambulatory Visit (HOSPITAL_COMMUNITY)
Admission: RE | Admit: 2011-03-18 | Discharge: 2011-03-18 | Disposition: A | Discharge status: Home / Self Care | Payer: Medicare Other | Source: Ambulatory Visit | Attending: Physical Therapy | Admitting: Physical Therapy

## 2011-03-18 NOTE — Progress Notes (Signed)
Physical Therapy Treatment Patient Details  Name: [name] MRN: [id number] Date of Birth: Nov 15, 1925  Today's Date: 03/18/2011 Time: 1110-1150 Time Calculation (min): 40 min Charges: 30' Manual, 10' NMR Visit#: 9  of 12      Subjective: Symptoms/Limitations Symptoms: Pt reports that she is still having pain overall and her balance is only a little better.  Pain Assessment Currently in Pain?: Yes Pain Score:   5 Pain Location: Leg Pain Orientation: Left;Anterior  Precautions/Restrictions     Exercise/Treatments Multifidus re-training.  Use of pelvic cue and arm lifts cue.  Had most results with forearm lift cue.  Able to maintain 1x10 sec on her L and 2x10 sec on her R.  Did not give as an HEP secondary to requiring max cueing at end of session.  Manual Therapy Manual Therapy: Joint mobilization Joint Mobilization: Grade I PA to L3-T12 on L side only, decrease in overall spasm and mild decrease in comporable signs.  Other Manual Therapy: nerve glides to improve femoral and sciatic nerve root length. x25 minutes   Physical Therapy Assessment and Plan PT Assessment and Plan Clinical Impression Statement: Hip flexion initally at 80 degrees and went to 100 degrees.  Knee flexion in prone initally at 85 degrees and went to 115 degrees after treatment today.  pain walking in was a 5/10 and went down to a 4/10.  Comporable sign was R quadrant extension, initally 6/10 went to 0/10 then had L quadrant extension pain rated at 4/10.  R and L SLS remained 2 seconds for each activitiy.  Overall patient reported decreased pain with lumbar movment and walking today.   Has considerable multifidus weakness, especially through her L side and needs multimodal cueing to turn off erector spinae and in order to relax.  PT Plan: 10th visit re-assment next treatment. Give her ODI to fill out.     Goals    Problem List Patient Active Problem List  Diagnoses  . HYPERLIPIDEMIA  . TOBACCO ABUSE  .  Orthostatic hypotension  . GERD  . DIVERTICULOSIS, ASYMPTOMATIC  . OSTEOPENIA  . ASCVD (arteriosclerotic cardiovascular disease)  . Cerebrovascular disease  . Syncope       GP No functional reporting required  Danie Diehl 03/18/2011, 12:03 PM

## 2011-03-20 ENCOUNTER — Ambulatory Visit (HOSPITAL_COMMUNITY)
Admission: RE | Admit: 2011-03-20 | Discharge: 2011-03-20 | Disposition: A | Discharge status: Home / Self Care | Payer: Medicare Other | Source: Ambulatory Visit

## 2011-03-20 NOTE — Progress Notes (Signed)
4Physical Therapy Treatment Patient Details  Name: [name] MRN: [id number] Date of Birth: [birth year]  Today's Date: 03/20/2011 Time: 1110-1210 Time Calculation (min): 60 min Visit#: 10  of 12   Re-eval: 03/27/11  Charge: NMR 50 min Manual 10 min  Subjective: Symptoms/Limitations Symptoms: Pt reported no back pain but L leg is bothering her, increased pain with activity and c/o increased dizziness this week.  Last session felt good, would like to focus on my balance today. Pain Assessment Currently in Pain?: Yes Pain Score:   4 Pain Location: Leg Pain Orientation: Left;Anterior  Objective:   Exercise/Treatments Balance Exercises Tandem Walking: 2 round trips Retro Gait: 1 round trip;Limitations Retro Gait Limitations: c/o dizziness Rotation with Cones: 1RT each direction standing on foam. Tandem Stance: Limitations Tandem Stance Limitations: 4x 30" with no HHA Balance Master: Limits for Stability: TEST; u/a to complete c/o dizzness Balance Master: Static: TEST    Manual Therapy Other Manual Therapy: CTR to L leg  Physical Therapy Assessment and Plan PT Assessment and Plan Clinical Impression Statement: Began balance master test to assess pt.'s dynamic stability and limits of stability.  Pt with good static balance but c/o increase dizziness with LOS and had to end session before test complete.  Overall balance is improving, pt able to regain balance with less assistance required but does c/o dizziness with turns and rotation. Oswetry pain scale complete, pt's perceived  functional disability 44%. PT Plan: Continue with balance and strength training, reduce L leg pain with manual techniques.    Goals    Problem List Patient Active Problem List  Diagnoses  . HYPERLIPIDEMIA  . TOBACCO ABUSE  . Orthostatic hypotension  . GERD  . DIVERTICULOSIS, ASYMPTOMATIC  . OSTEOPENIA  . ASCVD (arteriosclerotic cardiovascular disease)  . Cerebrovascular disease  . Syncope      PT - End of Session Activity Tolerance: Patient tolerated treatment well General Behavior During Session: [facility] for tasks performed Cognition: [facility] for tasks performed   Juel Burrow 03/20/2011, 1:16 PM

## 2011-03-22 ENCOUNTER — Ambulatory Visit (HOSPITAL_COMMUNITY)
Admission: RE | Admit: 2011-03-22 | Discharge: 2011-03-22 | Disposition: A | Discharge status: Home / Self Care | Payer: Medicare Other | Source: Ambulatory Visit | Attending: Family Medicine | Admitting: Family Medicine

## 2011-03-22 NOTE — Progress Notes (Signed)
Physical Therapy Re-Evaluation/treatment  Patient Details  Name: [name] MRN: [id number] Date of Birth: [birth year]  Today's Date: 03/22/2011 Time: 7829-5621 Time Calculation (min): 38 min Charges: 25' NMR,  13' Self care Visit#: 82  of 19   Re-eval: 04/21/11    Past Medical History:  Past Medical History  Diagnosis Date  . ASCVD (arteriosclerotic cardiovascular disease)     CABG surgery in 06/2005 for left main disease; normal EF; negative stress nuclear 08/2008  . Cerebrovascular disease     1993-CVA; 08/2008-mild atherosclerosis without focal stenosis  . Syncope      Negative even recorder dated 3/08  . Tobacco abuse     60 pack years; continuing  . Orthostatic hypotension   . Diverticulosis   . Osteopenia   . GERD (gastroesophageal reflux disease)    Past Surgical History:  Past Surgical History  Procedure Date  . Abdominal hysterectomy 1990  . Coronary artery bypass graft 2007    Left main disease-2007  . Ventral hernia repair   . Appendectomy   . Tonsillectomy     Subjective Symptoms/Limitations Symptoms: She still feels like her pain in her leg is the worst.  She cannot get used to the pain.   How long can you sit comfortably?: no difficulty  How long can you stand comfortably?: 30 minutes  How long can you walk comfortably?: 30 minutes (was 30 minutes with quad cane) Pain Assessment Currently in Pain?: Yes Pain Score:   3 Pain Location: Knee Pain Orientation: Left;Anterior  Cognition/Observation Observation/Other Assessments Observations: Pain with passive inversion of R ankle, knee flexion and hip extension.   Assessment LLE Strength Left Hip Flexion: 3+/5 (was 3/5) Left Hip Extension: 3+/5 (was 3/5) Left Hip ABduction: 4/5 (was 2/5) Left Hip ADduction: 3+/5 Left Knee Flexion: 4/5 (was 3/5) Left Knee Extension: 4/5 (was 3/5) Left Ankle Dorsiflexion: 5/5 (was 3/5) Left Ankle Inversion:  (unable to reproduce pain with active movment) Left  Ankle Eversion:  (unable to reproduce pain w/active movement) Lumbar AROM Lumbar Flexion: Decreased 25% - no pain ( was 50%) Lumbar Extension: Decreased 25% - without pain (was 75% with mild pain) Palpation Palpation: Increased muscular spasms to L lateral leg.  Decreased PA mobility to L5-L2 spinous process with increased pain over L5 R transverse process.  Poor mutlifidus, TrA and gluteus medius motor control.  Exercise/Treatments Mobility/Balance  Ambulation/Gait Ambulation/Gait: Yes Ambulation/Gait Assistance: 7: Independent Gait Pattern: Step-to pattern;Decreased weight shift to left   Manual Therapy Joint Mobilization: Grade II-III L proximal tib fib joint mobs to decrease leg pain (pain remained the same), Grade II joint mobs to R L5 transverse process (without change in pain) Other Manual Therapy: Nerve glides to femoral nerve in prone position. Reports slight decrease in pain afterwards.  NMR to L multifidus and gluteus medius in R sidelying for both.  L Multifidus 2x10 sec holds after NMR, Gluteus medius 4x10 sec   Physical Therapy Assessment and Plan PT Assessment and Plan Clinical Impression Statement: [name] has attended 11 OP PT sessions over the past 4 weeks.  She has met all STG and 3/4 LTG.  Added two new STG. She has made considerable gains with her strength, independence in ambulation, improved gait.  Howere continues to have reports of pain (dull/achy) to her L lateral leg which increases with femoral nerve glides, impaired balance and impaired motor control, strength and endurance to her core stabalizers and RLE.  She will continue to benefit from skilled OP PT in order  to address the above impairments to maximize function and reach goals.  PT Frequency: Min 2X/week PT Duration: 4 weeks PT Treatment/Interventions: Neuromuscular re-education;Therapeutic exercise PT Plan: Improve core stabilization, continue with femoral nerve glides , balance activities. Heel/Toe Roll  in and outs.    Goals Home Exercise Program Pt will Perform Home Exercise Program: Independently PT Goal: Perform Home Exercise Program - Progress: Met PT Short Term Goals Time to Complete Short Term Goals: 2 weeks PT Short Term Goal 1: Pt will report pain less than or equal to a 4/10 for 50% of her day.  PT Short Term Goal 1 - Progress: Met PT Short Term Goal 2: Pt will improve lumbar ROM by 25% for flexion and extension.  PT Short Term Goal 2 - Progress: Met PT Short Term Goal 3: Pt will improve core strength by 1 muscle grade.  PT Short Term Goal 3 - Progress: Met PT Short Term Goal 4: New Goal 03/22/11: Pt will improve core stability and motor control and demonstrate gluteus medius activation 10x10 sec holds and multifidus 10x10 sec holds with minimal cueing.  PT Short Term Goal 5: New Goal 03/22/11: Pt will decrease her ODI to 34% for improved QOL.  PT Long Term Goals Time to Complete Long Term Goals: 4 weeks PT Long Term Goal 1: Pt will improve core and LE strength in order to tolerate walking and standing for 30 minutes to complete household activities.  PT Long Term Goal 1 - Progress: Met PT Long Term Goal 2: Pt will improve lumbar ROM to [facility] in order to tolerate sitting for 90 minutes without an increase in pain.  PT Long Term Goal 2 - Progress: Met Long Term Goal 3: Pt will report pain less than or eqaul to 3/10 for improved quality of life.   Long Term Goal 3 Progress: Progressing toward goal Long Term Goal 4: Pt will improve her LE strength and endurance to tolerate ambulating x10 minutes independently to continue with exercise routine.  Long Term Goal 4 Progress: Met  Problem List Patient Active Problem List  Diagnoses  . HYPERLIPIDEMIA  . TOBACCO ABUSE  . Orthostatic hypotension  . GERD  . DIVERTICULOSIS, ASYMPTOMATIC  . OSTEOPENIA  . ASCVD (arteriosclerotic cardiovascular disease)  . Cerebrovascular disease  . Syncope    PT Plan of Care PT Home Exercise Plan:  Explained importance of continue with exercises as long as they do not increase her pain. Consulted and Agree with Plan of Care: Patient  GP  Functional Reporting Modifier  Current Status  [phone] - Changing & Maintaing Body Position CK - At least 40% but less than 60% impaired, limited or restricted  Goal Status  [phone] - Changing & Maintaing Body Position CI - At least 1% but less than 20% impaired, limited or restricted  Based on initial ODI, strength, pain scale and independent ambulation. Aiman Sonn 03/22/2011, 12:12 PM  Physician Documentation Your signature is required to indicate approval of the treatment plan as stated above.  Please sign and either send electronically or make a copy of this report for your files and return this physician signed original.   Please mark one 1.__approve of plan  2. ___approve of plan with the following conditions.   ______________________________  _____________________ Physician Signature                                                                                                             Date

## 2011-03-26 ENCOUNTER — Ambulatory Visit (HOSPITAL_COMMUNITY)
Admission: RE | Admit: 2011-03-26 | Discharge: 2011-03-26 | Disposition: A | Discharge status: Home / Self Care | Payer: Medicare Other | Source: Ambulatory Visit | Attending: Physical Therapy | Admitting: Physical Therapy

## 2011-03-26 NOTE — Progress Notes (Signed)
Physical Therapy Treatment Patient Details  Name: [name] MRN: [id number] Date of Birth: 1925-02-11  Today's Date: 03/26/2011 Time: 4098-1191 Time Calculation (min): 48 min Visit#: 12  of 19   Re-eval: 04/19/11 Charges:  NMR 44'    Subjective: Symptoms/Limitations Symptoms: Pt. states she is having a good day today; no dizziness today. Pain Assessment Currently in Pain?: No/denies   Exercise/Treatments Balance Exercises Tandem Walking: 2 round trips Retro Gait: 2 round trips Step Over Cones (Round Trips): hurdles 2 RT alternating 6 and 12in Rotation with Cones: 1RT R/L and F/B standing on foam. Single Limb Stance Limitations: R 13", L8" Balance Beam: 2Rt tandem and sidestepping   Physical Therapy Assessment and Plan PT Assessment and Plan Clinical Impression Statement: Pt. required 3 seated rest breaks during session today and one restroom break as she reported having bowel issues today.  Increased difficulty of cone task to front/back retrieval and performed sidestepping on balance beam.  Pt. with more difficulty controlling A/P balance than lateral. PT Plan: Continue POC increasing overall balance and safety.     Problem List Patient Active Problem List  Diagnoses  . HYPERLIPIDEMIA  . TOBACCO ABUSE  . Orthostatic hypotension  . GERD  . DIVERTICULOSIS, ASYMPTOMATIC  . OSTEOPENIA  . ASCVD (arteriosclerotic cardiovascular disease)  . Cerebrovascular disease  . Syncope    PT - End of Session Activity Tolerance: Patient tolerated treatment well;Treatment limited secondary to medical complications (Comment) (Pt. having bowel issues today) General Behavior During Session: [facility] for tasks performed Cognition: [facility] for tasks performed    [name] B. Bascom Levels, PTA 03/26/2011, 11:56 AM

## 2011-03-28 ENCOUNTER — Telehealth (HOSPITAL_COMMUNITY): Payer: Self-pay

## 2011-03-28 ENCOUNTER — Ambulatory Visit (HOSPITAL_COMMUNITY): Payer: Medicare Other

## 2011-04-02 ENCOUNTER — Ambulatory Visit (INDEPENDENT_AMBULATORY_CARE_PROVIDER_SITE_OTHER): Payer: Medicare Other | Admitting: Cardiology

## 2011-04-02 ENCOUNTER — Encounter: Payer: Self-pay | Admitting: Cardiology

## 2011-04-02 VITALS — BP 141/90 | HR 46 | Resp 16 | Ht 64.0 in | Wt 114.0 lb

## 2011-04-02 DIAGNOSIS — E785 Hyperlipidemia, unspecified: Secondary | ICD-10-CM

## 2011-04-02 DIAGNOSIS — I951 Orthostatic hypotension: Secondary | ICD-10-CM

## 2011-04-02 DIAGNOSIS — R55 Syncope and collapse: Secondary | ICD-10-CM

## 2011-04-02 DIAGNOSIS — F17200 Nicotine dependence, unspecified, uncomplicated: Secondary | ICD-10-CM

## 2011-04-02 DIAGNOSIS — I2510 Atherosclerotic heart disease of native coronary artery without angina pectoris: Secondary | ICD-10-CM

## 2011-04-02 NOTE — Assessment & Plan Note (Signed)
Repeat lipid profile will be obtained and medication adjusted if necessary.

## 2011-04-02 NOTE — Assessment & Plan Note (Deleted)
Patient has had an excellent response to Midodrine with only minimal residual orthostatic symptoms.  Attempt to taper drug can be instituted after 6-12 months of therapy.

## 2011-04-02 NOTE — Progress Notes (Signed)
Patient ID: [name], female   DOB: 05-Jan-1926, 78 y.o.   MRN: [id number] HPI: Scheduled return visit for this very nice and sharp older woman with coronary artery disease.  Since her last visit, she has done superbly from a cardiovascular standpoint.  She has had mild lightheadedness, but this has been markedly improved since starting Midodrine.  She has had no dyspnea nor chest discomfort.  Her current principal problem has been lower extremity and back pain attributed to degenerative joint disease of the lumbosacral spine.  She has been evaluated by Dr. Romeo Apple who recommended physical therapy.  Prior to Admission medications   Medication Sig Start Date End Date Taking? Authorizing Provider  acetaminophen (TYLENOL) 325 MG tablet Take 650 mg by mouth every 6 (six) hours as needed.     Yes Historical Provider, MD  Ascorbic Acid (VITAMIN C) 1000 MG tablet Take 1,000 mg by mouth daily.   Yes Historical Provider, MD  aspirin 81 MG tablet Take 81 mg by mouth daily.     Yes Historical Provider, MD  atorvastatin (LIPITOR) 10 MG tablet Take 10 mg by mouth daily.     Yes Historical Provider, MD  Calcium-Vitamin D (CALTRATE 600 PLUS-VIT D PO) Take 1 tablet by mouth daily.     Yes Historical Provider, MD  dextromethorphan-guaiFENesin (MUCINEX DM) 30-600 MG per 12 hr tablet Take 1 tablet by mouth every 12 (twelve) hours.     Yes Historical Provider, MD  donepezil (ARICEPT) 10 MG tablet Take 10 mg by mouth at bedtime as needed.     Yes Historical Provider, MD  midodrine (PROAMATINE) 10 MG tablet Take 5 mg by mouth 3 (three) times daily.    Yes Historical Provider, MD  Multiple Vitamins-Minerals (MULTIVITAMIN WITH MINERALS) tablet Take 1 tablet by mouth daily.     Yes Historical Provider, MD  nitroGLYCERIN (NITROSTAT) 0.4 MG SL tablet Place 0.4 mg under the tongue every 5 (five) minutes as needed.     Yes Historical Provider, MD  Omega-3 Fatty Acids (FISH OIL) 1000 MG CAPS Take 2 capsules by mouth daily.     Yes Historical Provider, MD  vitamin B-12 (CYANOCOBALAMIN) 250 MCG tablet Take 250 mcg by mouth daily.     Yes Historical Provider, MD   Allergies  Allergen Reactions  . Cephalexin   . Contrast Media (Iodinated Diagnostic Agents)   . Iohexol     Urticaria, emesis, numbness during IVP performed at [facility] years ago  . Penicillins   . Shellfish Allergy   . Sulfa Antibiotics    Past medical history, social history, and family history reviewed and updated.  ROS: Denies orthopnea, PND, pedal edema, palpitations, lightheadedness or dyspnea.  PHYSICAL EXAM: BP 141/90  Pulse 46  Resp 16  Ht 5\' 4"  (1.626 m)  Wt 51.71 kg (114 lb)  BMI 19.57 kg/m2; weight has increased 9 pounds since her last visit.  General-Well developed; no acute distress Body habitus-Thin Neck-No JVD; no carotid bruits Lungs-clear lung fields; resonant to percussion Cardiovascular-normal PMI; Distant S1 and S2; modest systolic ejection murmur; multiple prematures Abdomen-normal bowel sounds; soft and non-tender without masses or organomegaly Musculoskeletal-No deformities, no cyanosis or clubbing Neurologic-Normal cranial nerves; symmetric strength and tone Skin-Warm, no significant lesions Extremities-distal pulses intact; no edema  Rhythm Strip:  Sinus bradycardia at a rate of 53 with frequent PVCs  ASSESSMENT AND PLAN:  [place] Bing, MD 04/02/2011 2:30 PM

## 2011-04-02 NOTE — Patient Instructions (Signed)
Your physician recommends that you schedule a follow-up appointment in: 6 months  Your physician recommends that you return for lab work in: Within the week   

## 2011-04-02 NOTE — Assessment & Plan Note (Signed)
Patient has had an excellent response to Midodrine with only minimal residual orthostatic symptoms.  Attempt to taper drug can be instituted after 6-12 months of therapy. 

## 2011-04-03 ENCOUNTER — Ambulatory Visit (HOSPITAL_COMMUNITY)
Admission: RE | Admit: 2011-04-03 | Discharge: 2011-04-03 | Disposition: A | Discharge status: Home / Self Care | Payer: Medicare Other | Source: Ambulatory Visit | Attending: Family Medicine | Admitting: Family Medicine

## 2011-04-03 NOTE — Progress Notes (Signed)
Physical Therapy Treatment Patient Details  Name: [name] MRN: [id number] Date of Birth: 1925-11-28  Today's Date: 04/03/2011 Time: 4098-1191 Time Calculation (min): 47 min Visit#: 13  of 19   Re-eval: 04/19/11  Charge: therex 12 min Manual 8 min NMR 27 min  Subjective: Symptoms/Limitations Symptoms: Pt reported feeling good today, no dizziness.  Pt stated no L LE pain at entrance stated pain arrives after standing in kitchen cooking or doing dishes.   Pain Assessment Currently in Pain?: No/denies  Objective:  Exercise/Treatments Stability Bridge: Supine;15 reps Straight Leg Raise: Supine;15 reps;Limitations Straight Leg Raises Limitations: fewer reps secondary to increased pain L LE Balance Exercises Tandem Walking: 2 round trips Retro Gait: 2 round trips Step Over Cones (Round Trips): hurdles 2 RT alternating 6 and 12in Rotation with Cones: 1RT R/L and F/B standing on foam. Single Limb Stance Limitations: 1x 30" B UE A; R 11" L 6" max of 3 with c/o increased pain L LE Tandem Stance: Limitations Tandem Stance Limitations: 2x 30" each Balance Beam: 2Rt tandem and sidestepping Balance Poses   Manual Therapy Other Manual Therapy: Nerve glides to femoral nerve in prone position. Reports slight decrease in pain afterwards   Physical Therapy Assessment and Plan PT Assessment and Plan Clinical Impression Statement: Pt with multiple LOB episodes requiring min-mod assistance and vc-ing to slow down and regain balance before advancing next step.  Pt continues to be limited by endurance and L LE pain.  Manual nerve gliding to femoral nerve complete at end of session, pt stated slight relief to L LE following. PT Plan: Continue per POC, increasing balance, safety, core strengthening and pain reduction.    Goals    Problem List Patient Active Problem List  Diagnoses  . HYPERLIPIDEMIA  . TOBACCO ABUSE  . Orthostatic hypotension  . GERD  . DIVERTICULOSIS, ASYMPTOMATIC    . OSTEOPENIA  . ASCVD (arteriosclerotic cardiovascular disease)  . Cerebrovascular disease  . Syncope       GP No functional reporting required  Juel Burrow, PTA 04/03/2011, 12:12 PM

## 2011-04-03 NOTE — Assessment & Plan Note (Signed)
No symptoms to suggest recurrent myocardial ischemia in the 6 years since CABG.

## 2011-04-04 ENCOUNTER — Ambulatory Visit (HOSPITAL_COMMUNITY)
Admission: RE | Admit: 2011-04-04 | Discharge: 2011-04-04 | Disposition: A | Discharge status: Home / Self Care | Payer: Medicare Other | Source: Ambulatory Visit | Attending: Physical Therapy | Admitting: Physical Therapy

## 2011-04-04 NOTE — Progress Notes (Signed)
Physical Therapy Treatment Patient Details  Name: [name] MRN: [id number] Date of Birth: Jan 31, 1925  Today's Date: 04/04/2011 Time: 1020-1106 Time Calculation (min): 46 min Visit#: 14  of 19   Re-eval: 04/19/11 Charges:  NMR 30', therex 12'    Subjective: Symptoms/Limitations Symptoms: Pt. states she is doing well, feels her balance is improving. Pain Assessment Currently in Pain?: No/denies  Exercises assisted by Trilby Leaver, SPTA Exercise/Treatments Stability Bridge: Supine;15 reps Straight Leg Raise: Supine;15 reps;Limitations Balance Exercises Tandem Walking: 2 round trips Retro Gait: 2 round trips Step Over Cones (Round Trips): hurdles 2 RT alternating 6 and 12in (and cones/weaving in obstacle course) Rotation with Cones: 1RT R/L and F/B standing on foam. Single Limb Stance Limitations: 1x 30" B UE A; R 9" L 6" max of 3 with c/o increased pain L LE Tandem Stance: Limitations Tandem Stance Limitations: 2x 30" each Balance Beam: 1 RT then became too dizzy.    Physical Therapy Assessment and Plan PT Assessment and Plan Clinical Impression Statement: Pt. became dizzy on balance beam and unable to complete remainder of activities on the beam; Pt. able to complete lumbar exercises without difficulty. PT Plan: Continue to progress balance exercise/stability.     Problem List Patient Active Problem List  Diagnoses  . HYPERLIPIDEMIA  . TOBACCO ABUSE  . Orthostatic hypotension  . GERD  . DIVERTICULOSIS, ASYMPTOMATIC  . OSTEOPENIA  . ASCVD (arteriosclerotic cardiovascular disease)  . Cerebrovascular disease  . Syncope    PT - End of Session Equipment Utilized During Treatment: Gait belt Activity Tolerance: Patient tolerated treatment well General Behavior During Session: [facility] for tasks performed Cognition: [facility] for tasks performed   Annebelle Bostic B. Bascom Levels, PTA 04/04/2011, 1:19 PM

## 2011-04-09 ENCOUNTER — Ambulatory Visit (HOSPITAL_COMMUNITY)
Admission: RE | Admit: 2011-04-09 | Discharge: 2011-04-09 | Disposition: A | Discharge status: Home / Self Care | Payer: Medicare Other | Source: Ambulatory Visit | Attending: Orthopedic Surgery | Admitting: Orthopedic Surgery

## 2011-04-09 DIAGNOSIS — E785 Hyperlipidemia, unspecified: Secondary | ICD-10-CM | POA: Insufficient documentation

## 2011-04-09 DIAGNOSIS — M79609 Pain in unspecified limb: Secondary | ICD-10-CM | POA: Insufficient documentation

## 2011-04-09 DIAGNOSIS — IMO0001 Reserved for inherently not codable concepts without codable children: Secondary | ICD-10-CM | POA: Insufficient documentation

## 2011-04-09 NOTE — Progress Notes (Signed)
Physical Therapy Treatment Patient Details  Name: NEVEA SPIEWAK MRN: [id number] Date of Birth: 1925/09/04  Today's Date: 04/09/2011 Time: 4098-1191 Time Calculation (min): 42 min Visit#: 15  of 19   Re-eval: 04/19/11  Charge:  NMR 30 therex 12 min  Subjective: Symptoms/Limitations Symptoms: Feel like my balance is improving, able to get around my house a lot easier, no dizzy episodes this weekend. Pain Assessment Currently in Pain?: No/denies  Objective:   Exercise/Treatments Stability Bridge: Supine;15 reps Bent Knee Raise: Supine;10 reps Straight Leg Raise: Limitations;15 reps Straight Leg Raises Limitations: R LE only secondary to L LE pain  Balance Exercises Tandem Walking: 2 round trips Retro Gait: 2 round trips Step Over Cones (Round Trips): hurdles 2 RT alternating 6 and 12in Rotation with Cones: 1RT F/B standing on foam. Single Limb Stance Limitations: 1x 30" B UE A; R 4" L 12" max of 3 with c/o increased pain L LE Balance Beam: held due to c/o dizzyness  Physical Therapy Assessment and Plan PT Assessment and Plan Clinical Impression Statement: Pt with increase dizzy episodes with rotation and turns.  Pt able to complete lumbar exercises with cueing to slow down and proper techniques, pt did c/o increased L LE pain with lumbar exercises, reduced reps due to the pain.   PT Plan: Continue to progress balance exercise/stability    Goals    Problem List Patient Active Problem List  Diagnoses  . HYPERLIPIDEMIA  . TOBACCO ABUSE  . Orthostatic hypotension  . GERD  . DIVERTICULOSIS, ASYMPTOMATIC  . OSTEOPENIA  . ASCVD (arteriosclerotic cardiovascular disease)  . Cerebrovascular disease  . Syncope    PT - End of Session Activity Tolerance: Patient tolerated treatment well General Behavior During Session: [facility] for tasks performed Cognition: [facility] for tasks performed  GP No functional reporting required  Juel Burrow, PTA 04/09/2011, 11:50 AM

## 2011-04-11 ENCOUNTER — Telehealth (HOSPITAL_COMMUNITY): Payer: Self-pay

## 2011-04-11 ENCOUNTER — Ambulatory Visit (HOSPITAL_COMMUNITY): Payer: Medicare Other | Admitting: Physical Therapy

## 2011-04-12 ENCOUNTER — Encounter: Payer: Self-pay | Admitting: *Deleted

## 2011-04-16 ENCOUNTER — Ambulatory Visit (HOSPITAL_COMMUNITY)
Admission: RE | Admit: 2011-04-16 | Discharge: 2011-04-16 | Disposition: A | Discharge status: Home / Self Care | Payer: Medicare Other | Source: Ambulatory Visit | Attending: Orthopedic Surgery | Admitting: Orthopedic Surgery

## 2011-04-16 NOTE — Progress Notes (Signed)
Physical Therapy Treatment Patient Details  Name: [name] MRN: [id number] Date of Birth: 1925-07-16  Today's Date: 04/16/2011 Time: 4098-1191 Time Calculation (min): 38 min Charges: NMR 38' Visit#: 16  of 19   Re-eval: 04/19/11    Subjective: Symptoms/Limitations Symptoms: I am very dizzy today.  I had to go to see the doctor today.  He said that I was doing about the same as before.  Pain Assessment Currently in Pain?: Yes Pain Score:  (Leg pain)  Precautions/Restrictions     Exercise/Treatments Mobility/Balance     Static Sitting Balance Static Sitting - Comment/# of Minutes: Completed eye habituation exercises today:  1. Eyes stable with cervical rotation x10. 2. Cervical Rotation with scap retraction x10 3. Eye movment neck stable.  Static Standing Balance Static Standing - Comment/# of Minutes: 1. Standing feet together eyes closed 3x1 minute w/min A- supervision 2. STS x3 w/eye stabalization 3. Pick up an object from floor x 4 w/stabalization after  Physical Therapy Assessment and Plan PT Assessment and Plan Clinical Impression Statement: Pt had decreased dizziness after treatment today.  She demonstrates significant decrease in cervical rotation, scapular motor control, impaired posture and decreased strength in her eye musculature which may also be contributing to her overall balance disorder.  PT Plan: Cont to progress balance and stabality through low level activities and cervical rotation and eye stabalization.     Goals    Problem List Patient Active Problem List  Diagnoses  . HYPERLIPIDEMIA  . TOBACCO ABUSE  . Orthostatic hypotension  . GERD  . DIVERTICULOSIS, ASYMPTOMATIC  . OSTEOPENIA  . ASCVD (arteriosclerotic cardiovascular disease)  . Cerebrovascular disease  . Syncope       GP No functional reporting required  Phynix Horton 04/16/2011, 3:11 PM

## 2011-04-18 ENCOUNTER — Ambulatory Visit (HOSPITAL_COMMUNITY)
Admission: RE | Admit: 2011-04-18 | Discharge: 2011-04-18 | Disposition: A | Discharge status: Home / Self Care | Payer: Medicare Other | Source: Ambulatory Visit | Attending: Physical Therapy | Admitting: Physical Therapy

## 2011-04-18 NOTE — Progress Notes (Signed)
Physical Therapy Treatment Patient Details  Name: [name] MRN: [id number] Date of Birth: [birth year]  Today's Date: 04/18/2011 Time: 1120-1200 Time Calculation (min): 40 min Visit#: 17  of 19   Re-eval: 04/19/11  Charge: NMR 30 min therex 8 min  Subjective: Symptoms/Limitations Symptoms: I am better with the dizziness today compared to yesterday, feel like my dizziness and leg pain increase with activity. Pain Assessment Currently in Pain?: Yes Pain Score:   2 Pain Location: Leg Pain Orientation: Left;Other (Comment) (increases with weight bearing)  Objective:   Exercise/Treatments Mobility/Balance     Static Sitting Balance Static Sitting - Comment/# of Minutes: Completed eye habituation exercises today:  1. Eyes stable with cervical rotation x10. 2. Cervical Rotation with scap retraction x10 3. Eye movment neck stable.   Static Standing Balance Static Standing - Comment/# of Minutes: . Standing feet together eyes closed 3x1 minute w/min A- supervision 2. Standing feet together with cervical rotation,  eyes following finger with cervical stabile, cervical flexion and extension with focus on fingers 10x each 3. STS x3 w/eye stabalization   Supine: Reviewed HEP for LLE strengthening SLR 10x each LE Bridge 10 x   Physical Therapy Assessment and Plan PT Assessment and Plan Clinical Impression Statement: Session focus on overall balance disorder associated with dizziness, pt demonstrated significant decrease in cervical rotation, impaired posture, and decreased strength in her eye musculature which may also be contributing to her overall balance disorder.  Findings noted with increased dizziness with cervical rotation to the right, pt with less dizziness with stable cervical and eye movements.  Pt stated dizziness reduced at end of session.   PT Plan: Re-eval next session.    Goals    Problem List Patient Active Problem List  Diagnoses  . HYPERLIPIDEMIA  . TOBACCO  ABUSE  . Orthostatic hypotension  . GERD  . DIVERTICULOSIS, ASYMPTOMATIC  . OSTEOPENIA  . ASCVD (arteriosclerotic cardiovascular disease)  . Cerebrovascular disease  . Syncope    PT - End of Session Equipment Utilized During Treatment: Gait belt Activity Tolerance: Patient tolerated treatment well General Behavior During Session: [facility] for tasks performed Cognition: [facility] for tasks performed  GP No functional reporting required  Juel Burrow, PTA 04/18/2011, 1:26 PM

## 2011-04-23 ENCOUNTER — Ambulatory Visit (HOSPITAL_COMMUNITY): Payer: Medicare Other | Admitting: Physical Therapy

## 2011-05-06 ENCOUNTER — Other Ambulatory Visit: Payer: Self-pay | Admitting: *Deleted

## 2011-05-06 DIAGNOSIS — G5790 Unspecified mononeuropathy of unspecified lower limb: Secondary | ICD-10-CM

## 2011-05-06 MED ORDER — GABAPENTIN 100 MG PO CAPS: 100.0000 mg | ORAL_CAPSULE | Freq: Three times a day (TID) | ORAL | Status: DC

## 2011-05-07 ENCOUNTER — Other Ambulatory Visit: Payer: Self-pay | Admitting: Cardiology

## 2011-05-08 ENCOUNTER — Other Ambulatory Visit: Payer: Self-pay | Admitting: Cardiology

## 2011-05-09 ENCOUNTER — Encounter: Payer: Self-pay | Admitting: Cardiology

## 2011-05-09 DIAGNOSIS — Z0189 Encounter for other specified special examinations: Secondary | ICD-10-CM | POA: Insufficient documentation

## 2011-05-09 LAB — LIPID PANEL
Cholesterol: 160 mg/dL (ref 0–200)
Triglycerides: 91 mg/dL (ref <150)
VLDL: 18 mg/dL (ref 0–40)

## 2011-05-09 LAB — CBC
Hemoglobin: 11.4 g/dL — ABNORMAL LOW (ref 12.0–15.0)
MCHC: 29.2 g/dL — ABNORMAL LOW (ref 30.0–36.0)
RDW: 15.3 % (ref 11.5–15.5)

## 2011-06-10 ENCOUNTER — Other Ambulatory Visit: Payer: Self-pay | Admitting: *Deleted

## 2011-06-10 DIAGNOSIS — G5790 Unspecified mononeuropathy of unspecified lower limb: Secondary | ICD-10-CM

## 2011-06-10 MED ORDER — GABAPENTIN 100 MG PO CAPS: 100.0000 mg | ORAL_CAPSULE | Freq: Three times a day (TID) | ORAL | Status: DC

## 2011-08-26 ENCOUNTER — Other Ambulatory Visit (HOSPITAL_COMMUNITY): Payer: Self-pay | Admitting: Family Medicine

## 2011-08-26 DIAGNOSIS — Z139 Encounter for screening, unspecified: Secondary | ICD-10-CM

## 2011-08-27 ENCOUNTER — Ambulatory Visit (HOSPITAL_COMMUNITY)
Admission: RE | Admit: 2011-08-27 | Discharge: 2011-08-27 | Disposition: A | Discharge status: Home / Self Care | Payer: Medicare Other | Source: Ambulatory Visit | Attending: Family Medicine | Admitting: Family Medicine

## 2011-08-27 DIAGNOSIS — Z139 Encounter for screening, unspecified: Secondary | ICD-10-CM

## 2011-08-27 DIAGNOSIS — M810 Age-related osteoporosis without current pathological fracture: Secondary | ICD-10-CM | POA: Insufficient documentation

## 2011-08-29 ENCOUNTER — Encounter (INDEPENDENT_AMBULATORY_CARE_PROVIDER_SITE_OTHER): Payer: Self-pay | Admitting: *Deleted

## 2011-09-17 ENCOUNTER — Encounter (INDEPENDENT_AMBULATORY_CARE_PROVIDER_SITE_OTHER): Payer: Self-pay | Admitting: Internal Medicine

## 2011-09-17 ENCOUNTER — Other Ambulatory Visit (INDEPENDENT_AMBULATORY_CARE_PROVIDER_SITE_OTHER): Payer: Self-pay | Admitting: *Deleted

## 2011-09-17 ENCOUNTER — Ambulatory Visit (INDEPENDENT_AMBULATORY_CARE_PROVIDER_SITE_OTHER): Payer: Medicare Other | Admitting: Internal Medicine

## 2011-09-17 ENCOUNTER — Telehealth (INDEPENDENT_AMBULATORY_CARE_PROVIDER_SITE_OTHER): Payer: Self-pay | Admitting: *Deleted

## 2011-09-17 VITALS — BP 144/60 | HR 60 | Temp 97.7°F | Ht 65.0 in | Wt 119.0 lb

## 2011-09-17 DIAGNOSIS — Z1211 Encounter for screening for malignant neoplasm of colon: Secondary | ICD-10-CM

## 2011-09-17 DIAGNOSIS — R195 Other fecal abnormalities: Secondary | ICD-10-CM

## 2011-09-17 DIAGNOSIS — Z86010 Personal history of colonic polyps: Secondary | ICD-10-CM

## 2011-09-17 DIAGNOSIS — D509 Iron deficiency anemia, unspecified: Secondary | ICD-10-CM

## 2011-09-17 DIAGNOSIS — D649 Anemia, unspecified: Secondary | ICD-10-CM

## 2011-09-17 NOTE — Telephone Encounter (Signed)
Patient needs movi prep 

## 2011-09-17 NOTE — Patient Instructions (Addendum)
Colonoscopy/EGD with DR. REhman. The risks and benefits such as perforation, bleeding, and infection were reviewed with the patient and is agreeable.

## 2011-09-17 NOTE — Progress Notes (Signed)
Subjective:     Patient ID: [name], female   DOB: 05-20-1925, 78 y.o.   MRN: [id number]  HPI [name] is a referral from DR. Phillips Odor for one positive hemocult cards.  Daughter tells me that her mother is anemic from labs in office. Appetite is okay. No weight loss. No abdominal pain.  Her stools are brown. Usually has a BM about once a day.  08/26/2011 H and H 10.6 and 33.4, MCV 75.2 08/27/2011 RBC 4.423, Iron 23, UIBC 511, TIBC 534, % Sat 4. Folate greater than 20, Vitamin B12 818, Ferritin 11.    07/2005 Colonoscopy Dr. Rourk:IMPRESSION:  1. Internal hemorrhoids. Otherwise, normal rectum.  2. Left-sided diverticula. The remainder of the colonic mucosa and  terminal ileal mucosa appeared normal.    In 2004 she underwent a colonoscopy with a small polyp removed. No path report available, but she was advised to have a colonoscopy every 5 yrs.   Review of Systems Current Outpatient Prescriptions  Medication Sig Dispense Refill  . acetaminophen (TYLENOL) 325 MG tablet Take 650 mg by mouth every 6 (six) hours as needed.        . Ascorbic Acid (VITAMIN C) 1000 MG tablet Take 1,000 mg by mouth daily.      Marland Kitchen aspirin 81 MG tablet Take 81 mg by mouth daily.        . Calcium-Vitamin D (CALTRATE 600 PLUS-VIT D PO) Take 1 tablet by mouth daily.        Marland Kitchen dextromethorphan-guaiFENesin (MUCINEX DM) 30-600 MG per 12 hr tablet Take 1 tablet by mouth every 12 (twelve) hours.        Marland Kitchen donepezil (ARICEPT) 10 MG tablet Take 10 mg by mouth at bedtime as needed.        Marland Kitchen LIPITOR 10 MG tablet TAKE ONE (1) TABLET EACH DAY  30 tablet  6  . midodrine (PROAMATINE) 10 MG tablet Take 5 mg by mouth 3 (three) times daily.       . Multiple Vitamins-Minerals (MULTIVITAMIN WITH MINERALS) tablet Take 1 tablet by mouth daily.        . nitroGLYCERIN (NITROSTAT) 0.4 MG SL tablet Place 0.4 mg under the tongue every 5 (five) minutes as needed.        . Omega-3 Fatty Acids (FISH OIL) 1000 MG CAPS Take 1 capsule by mouth  daily.       . vitamin B-12 (CYANOCOBALAMIN) 250 MCG tablet Take 250 mcg by mouth daily.        Marland Kitchen DISCONTD: METOPROLOL SUCCINATE ER PO Take by mouth.       Past Medical History  Diagnosis Date  . ASCVD (arteriosclerotic cardiovascular disease)     CABG surgery in 06/2005 for left main disease; normal EF; negative stress nuclear 08/2008  . Cerebrovascular disease     1993-CVA; 08/2008-mild atherosclerosis without focal stenosis  . Syncope      Negative even recorder dated 3/08  . Tobacco abuse     60 pack years; continuing  . Orthostatic hypotension   . Diverticulosis   . Osteopenia   . GERD (gastroesophageal reflux disease)    History   Social History  . Marital Status: Married    Spouse Name: N/A    Number of Children: N/A  . Years of Education: N/A   Occupational History  . retired    Social History Main Topics  . Smoking status: Former Smoker -- 1.0 packs/day for 60 years    Types: Cigarettes  . Smokeless  tobacco: Never Used  . Alcohol Use: No  . Drug Use: No  . Sexually Active: Not on file   Other Topics Concern  . Not on file   Social History Narrative  . No narrative on file   Family Status  Relation Status Death Age  . Father Deceased      cause was trauma due to motor vehicle accident  . Mother      deceased; cardiac problems   Allergies  Allergen Reactions  . Cephalexin   . Contrast Media (Iodinated Diagnostic Agents)   . Iohexol     Urticaria, emesis, numbness during IVP performed at [facility] years ago  . Penicillins   . Shellfish Allergy   . Sulfa Antibiotics         Objective:   Physical Exam Filed Vitals:   09/17/11 1126  BP: 144/60  Pulse: 60  Temp: 97.7 F (36.5 C)  Height: 5\' 5"  (1.651 m)  Weight: 119 lb (53.978 kg)   Alert and oriented. Skin warm and dry. Oral mucosa is moist.   . Sclera anicteric, conjunctivae is pink. Thyroid not enlarged. No cervical lymphadenopathy. Lungs clear. Heart regular rate and rhythm.   Abdomen is soft. Bowel sounds are positive. No hepatomegaly. No abdominal masses felt. No tenderness. Stools brown and guaiac negative.  No edema to lower extremities. Patient is alert and oriented.     Assessment:    Guaiac positive stool by hx from Dr. Lamar Blinks office. Iron deficiency anemia. Hx colonic polyp. Colonic neoplasm, AVM, polyp need to be ruled out. If colonoscopy normal, EGD to rule out PUD disease.    Plan:    Colonoscopy, possible EGD with Dr Karilyn Cota. The risks and benefits such as perforation, bleeding, and infection were reviewed with the patient and is agreeable.

## 2011-09-19 MED ORDER — PEG-KCL-NACL-NASULF-NA ASC-C 100 G PO SOLR: 1.0000 | Freq: Once | ORAL | Status: DC

## 2011-09-23 ENCOUNTER — Other Ambulatory Visit (INDEPENDENT_AMBULATORY_CARE_PROVIDER_SITE_OTHER): Payer: Self-pay | Admitting: *Deleted

## 2011-09-23 DIAGNOSIS — Z86010 Personal history of colonic polyps: Secondary | ICD-10-CM

## 2011-09-23 DIAGNOSIS — R195 Other fecal abnormalities: Secondary | ICD-10-CM

## 2011-09-23 DIAGNOSIS — D649 Anemia, unspecified: Secondary | ICD-10-CM

## 2011-09-24 ENCOUNTER — Encounter (HOSPITAL_COMMUNITY): Payer: Self-pay | Admitting: Pharmacy Technician

## 2011-10-10 ENCOUNTER — Ambulatory Visit (HOSPITAL_COMMUNITY)
Admission: RE | Admit: 2011-10-10 | Discharge: 2011-10-10 | Disposition: A | Discharge status: Home / Self Care | Payer: Medicare Other | Source: Ambulatory Visit | Attending: Internal Medicine | Admitting: Internal Medicine

## 2011-10-10 ENCOUNTER — Encounter (HOSPITAL_COMMUNITY)
Admission: RE | Disposition: A | Discharge status: Home / Self Care | Payer: Self-pay | Source: Ambulatory Visit | Attending: Internal Medicine

## 2011-10-10 ENCOUNTER — Encounter (HOSPITAL_COMMUNITY): Payer: Self-pay | Admitting: *Deleted

## 2011-10-10 DIAGNOSIS — K5730 Diverticulosis of large intestine without perforation or abscess without bleeding: Secondary | ICD-10-CM | POA: Insufficient documentation

## 2011-10-10 DIAGNOSIS — R195 Other fecal abnormalities: Secondary | ICD-10-CM

## 2011-10-10 DIAGNOSIS — D509 Iron deficiency anemia, unspecified: Secondary | ICD-10-CM | POA: Insufficient documentation

## 2011-10-10 DIAGNOSIS — K5521 Angiodysplasia of colon with hemorrhage: Secondary | ICD-10-CM

## 2011-10-10 DIAGNOSIS — Z86010 Personal history of colonic polyps: Secondary | ICD-10-CM

## 2011-10-10 DIAGNOSIS — Z951 Presence of aortocoronary bypass graft: Secondary | ICD-10-CM | POA: Insufficient documentation

## 2011-10-10 DIAGNOSIS — D649 Anemia, unspecified: Secondary | ICD-10-CM

## 2011-10-10 DIAGNOSIS — K921 Melena: Secondary | ICD-10-CM | POA: Insufficient documentation

## 2011-10-10 DIAGNOSIS — K5520 Angiodysplasia of colon without hemorrhage: Secondary | ICD-10-CM | POA: Insufficient documentation

## 2011-10-10 DIAGNOSIS — R197 Diarrhea, unspecified: Secondary | ICD-10-CM

## 2011-10-10 HISTORY — DX: Unspecified asthma, uncomplicated: J45.909

## 2011-10-10 HISTORY — PX: COLONOSCOPY: SHX5424

## 2011-10-10 HISTORY — PX: ESOPHAGOGASTRODUODENOSCOPY: SHX5428

## 2011-10-10 SURGERY — COLONOSCOPY
Anesthesia: Moderate Sedation

## 2011-10-10 MED ORDER — MIDAZOLAM HCL 5 MG/5ML IJ SOLN
INTRAMUSCULAR | Status: AC
  Filled 2011-10-10: qty 10

## 2011-10-10 MED ORDER — MEPERIDINE HCL 50 MG/ML IJ SOLN
INTRAMUSCULAR | Status: AC
  Filled 2011-10-10: qty 1

## 2011-10-10 MED ORDER — STERILE WATER FOR IRRIGATION IR SOLN
Status: DC | PRN
  Administered 2011-10-10: 11:00:00

## 2011-10-10 MED ORDER — SODIUM CHLORIDE 0.45 % IV SOLN
INTRAVENOUS | Status: DC
  Administered 2011-10-10: 20 mL/h via INTRAVENOUS

## 2011-10-10 MED ORDER — FERROUS SULFATE 325 (65 FE) MG PO TABS: 325.0000 mg | ORAL_TABLET | Freq: Two times a day (BID) | ORAL | Status: DC

## 2011-10-10 MED ORDER — MIDAZOLAM HCL 5 MG/5ML IJ SOLN
INTRAMUSCULAR | Status: DC | PRN
  Administered 2011-10-10: 1 mg via INTRAVENOUS
  Administered 2011-10-10: 2 mg via INTRAVENOUS
  Administered 2011-10-10 (×2): 1 mg via INTRAVENOUS

## 2011-10-10 MED ORDER — MEPERIDINE HCL 25 MG/ML IJ SOLN
INTRAMUSCULAR | Status: DC | PRN
  Administered 2011-10-10: 20 mg via INTRAVENOUS

## 2011-10-10 MED ORDER — BUTAMBEN-TETRACAINE-BENZOCAINE 2-2-14 % EX AERO
INHALATION_SPRAY | CUTANEOUS | Status: DC | PRN
  Administered 2011-10-10: 2 via TOPICAL

## 2011-10-10 NOTE — H&P (Signed)
[name] is an 78 y.o. female.   Chief Complaint: Patient is here for colonoscopy to be followed by EGD if colonoscopy fails to show reason for iron deficiency. HPI: Patient is 78 year old Caucasian female who was recently found to have iron deficiency anemia by Dr. Dorthey Sawyer primary care physician. Her stool is guaiac positive. She denies melena or rectal bleeding. She does complain of diarrhea which started couple of months ago. She is 3-4 stools and occasionally 6 stools per day. She denies abdominal pain or nocturnal diarrhea. No history of peptic ulcer disease. She denies heartburn or dysphagia. Patient's last colonoscopy was possibly over 10 years ago.  Past Medical History  Diagnosis Date  . ASCVD (arteriosclerotic cardiovascular disease)     CABG surgery in 06/2005 for left main disease; normal EF; negative stress nuclear 08/2008  . Cerebrovascular disease     1993-CVA; 08/2008-mild atherosclerosis without focal stenosis  . Syncope      Negative even recorder dated 3/08  . Tobacco abuse     60 pack years; continuing  . Orthostatic hypotension   . Diverticulosis   . Osteopenia   . GERD (gastroesophageal reflux disease)   . Asthma     as a child    Past Surgical History  Procedure Date  . Abdominal hysterectomy 1990  . Coronary artery bypass graft 2007    Left main disease-2007  . Ventral hernia repair   . Appendectomy   . Tonsillectomy     Family History  Problem Relation Age of Onset  . Heart disease    . Arthritis    . Lung disease    . Asthma     Social History:  reports that she has quit smoking. Her smoking use included Cigarettes. She has a 60 pack-year smoking history. She has never used smokeless tobacco. She reports that she does not drink alcohol or use illicit drugs.  Allergies:  Allergies  Allergen Reactions  . Cephalexin   . Contrast Media (Iodinated Diagnostic Agents)   . Iohexol     Urticaria, emesis, numbness during IVP performed at [facility] years ago  . Penicillins   . Shellfish Allergy   . Sulfa Antibiotics     Medications Prior to Admission  Medication Sig Dispense Refill  . acetaminophen (TYLENOL) 325 MG tablet Take 650 mg by mouth every 6 (six) hours as needed. For pain      . Ascorbic Acid (VITAMIN C) 1000 MG tablet Take 1,000 mg by mouth daily.      Marland Kitchen aspirin 81 MG tablet Take 81 mg by mouth daily.        . Calcium-Vitamin D (CALTRATE 600 PLUS-VIT D PO) Take 1 tablet by mouth daily.        Marland Kitchen dextromethorphan-guaiFENesin (MUCINEX DM) 30-600 MG per 12 hr tablet Take 1 tablet by mouth every 12 (twelve) hours.        Marland Kitchen donepezil (ARICEPT) 10 MG tablet Take 10 mg by mouth at bedtime.       Marland Kitchen LIPITOR 10 MG tablet TAKE ONE (1) TABLET EACH DAY  30 tablet  6  . midodrine (PROAMATINE) 10 MG tablet Take 5 mg by mouth 3 (three) times daily.       . Multiple Vitamins-Minerals (MULTIVITAMIN WITH MINERALS) tablet Take 1 tablet by mouth daily.        . nitroGLYCERIN (NITROSTAT) 0.4 MG SL tablet Place 0.4 mg under the tongue every 5 (five) minutes as needed.        Marland Kitchen  Omega-3 Fatty Acids (FISH OIL) 1000 MG CAPS Take 1 capsule by mouth daily.       . vitamin B-12 (CYANOCOBALAMIN) 250 MCG tablet Take 250 mcg by mouth daily.          No results found for this or any previous visit (from the past 48 hour(s)). No results found.  ROS  Blood pressure 173/78, pulse 79, temperature 97.8 F (36.6 C), temperature source Oral, resp. rate 20, SpO2 97.00%. Physical Exam  Constitutional: She appears well-developed.       thin Caucasian female in NAD  HENT:  Mouth/Throat: Oropharynx is clear and moist.  Eyes: Conjunctivae normal are normal. No scleral icterus.  Neck: No thyromegaly present.  Cardiovascular: Normal rate and regular rhythm.   Murmur: grade 2/6 systolic ejection murmur at LLSB. Respiratory: Effort normal and breath sounds normal.  GI: Soft. She exhibits no distension and no mass. There is no tenderness.    Lymphadenopathy:    She has no cervical adenopathy.  Neurological: She is alert.  Skin: Skin is warm and dry.     Assessment/Plan Iron deficiency anemia. Heme positive stool. Diarrhea. Colonoscopy followed by EGD if indicated.  REHMAN,NAJEEB U 10/10/2011, 10:56 AM

## 2011-10-10 NOTE — Op Note (Signed)
COLONOSCOPY AND EGD PROCEDURE REPORT  PATIENT:  [name]  MR#:  [id number] Birthdate:  1925/12/28, 78 y.o., female Endoscopist:  Dr. Malissa Hippo, MD Referred By:  Dr. Colette Ribas, MD Procedure Date: 10/10/2011  Procedure:  Colonoscopy followed by EGD.  Indications:  Patient is 78 year old Caucasian female who was recently found to have iron deficiency anemia and heme-positive stools. She has few months history of nonbloody diarrhea. She denies melena rectal bleeding abdominal pain chronic heartburn nausea vomiting or dysphagia. She takes low-dose aspirin. Patient's last colonoscopy was several years ago.            Informed Consent:  The risks, benefits, alternatives & imponderables which include, but are not limited to, bleeding, infection, perforation, drug reaction and potential missed lesion have been reviewed.  The potential for biopsy, lesion removal, esophageal dilation, etc. have also been discussed.  Questions have been answered.  All parties agreeable.  Please see history & physical in medical record for more information.  Medications:  Demerol 25 mg IV Versed 5 mg IV Cetacaine spray topically for oropharyngeal anesthesia    COLONOSCOPY Description of procedure:  After a digital rectal exam was performed, that colonoscope was advanced from the anus through the rectum and colon to the area of the cecum, ileocecal valve and appendiceal orifice. The cecum was deeply intubated. These structures were well-seen and photographed for the record. From the level of the cecum and ileocecal valve, the scope was slowly and cautiously withdrawn. The mucosal surfaces were carefully surveyed utilizing scope tip to flexion to facilitate fold flattening as needed. The scope was pulled down into the rectum where a thorough exam including retroflexion was performed. Terminal ileum was also examined.  Findings:   Prep excellent. Normal terminal ileum. Three small nonbleeding AV  malformations at ascending colon. Multiple diverticula at sigmoid colon. Normal rectal mucosa and anal rectal junction.  Therapeutic/Diagnostic Maneuvers Performed:  Random biopsies taken from mucosa of sigmoid colon looking for microscopic or collagenous colitis.  Complications:  None  Cecal Withdrawal Time:  17 minutes  EGD  Description of procedure:  The endoscope was introduced through the mouth and advanced to the second portion of the duodenum without difficulty or limitations. The mucosal surfaces were surveyed very carefully during advancement of the scope and upon withdrawal.  Findings:  Esophagus:  Normal mucosa of the esophagus. Unremarkable GE junction. GEJ:  41 cm Stomach:  Stomach was empty and distended very well with insufflation. Folds in the proximal stomach were normal. Examination mucosa at gastric body, antrum, pyloric channel, angularis fundus and cardia was normal. Duodenum:  Normal bulbar and post bulbar mucosa  Therapeutic/Diagnostic Maneuvers Performed:  None  Impression:  Normal terminal ileum. 3 small nonbleeding AV malformations at ascending colon. Multiple diverticula at sigmoid colon.  Random biopsies taken from sigmoid colon because of history of diarrhea. Normal esophagogastroduodenoscopy. It is possible she's been losing blood intermittently from AV malformations.  Recommendations:  Standard instructions given. Ferrous sulfate 325 mg by mouth twice a day. I will contact patient with results of biopsy and further recommendations. Will check H&H in one month. If her anemia does not correct with iron she may need further evaluation.  [name]  10/10/2011 11:46 AM  CC: Dr. Colette Ribas, MD & Dr. Bonnetta Barry ref. provider found

## 2011-10-11 ENCOUNTER — Telehealth (INDEPENDENT_AMBULATORY_CARE_PROVIDER_SITE_OTHER): Payer: Self-pay | Admitting: *Deleted

## 2011-10-11 DIAGNOSIS — D509 Iron deficiency anemia, unspecified: Secondary | ICD-10-CM

## 2011-10-11 NOTE — Telephone Encounter (Signed)
Rec'd a call from Endo on 10/10/11. Patient will need to have H/H in 1 month. This has been noted for 11-10-11. Patient will be sent a letter as a reminder

## 2011-10-16 ENCOUNTER — Encounter (HOSPITAL_COMMUNITY): Payer: Self-pay | Admitting: Internal Medicine

## 2011-10-16 ENCOUNTER — Encounter (INDEPENDENT_AMBULATORY_CARE_PROVIDER_SITE_OTHER): Payer: Self-pay | Admitting: *Deleted

## 2011-10-30 ENCOUNTER — Ambulatory Visit: Admit: 2011-10-30 | Payer: Medicare Other | Admitting: Internal Medicine

## 2011-10-30 SURGERY — COLONOSCOPY
Anesthesia: Moderate Sedation

## 2011-11-04 ENCOUNTER — Encounter: Payer: Self-pay | Admitting: *Deleted

## 2011-11-04 ENCOUNTER — Encounter: Payer: Self-pay | Admitting: Cardiology

## 2011-11-04 ENCOUNTER — Ambulatory Visit (INDEPENDENT_AMBULATORY_CARE_PROVIDER_SITE_OTHER): Payer: Medicare Other | Admitting: Cardiology

## 2011-11-04 VITALS — BP 175/78 | HR 50 | Ht 64.5 in | Wt 115.0 lb

## 2011-11-04 DIAGNOSIS — R079 Chest pain, unspecified: Secondary | ICD-10-CM

## 2011-11-04 DIAGNOSIS — M899 Disorder of bone, unspecified: Secondary | ICD-10-CM

## 2011-11-04 DIAGNOSIS — I7090 Unspecified atherosclerosis: Secondary | ICD-10-CM

## 2011-11-04 DIAGNOSIS — K219 Gastro-esophageal reflux disease without esophagitis: Secondary | ICD-10-CM

## 2011-11-04 DIAGNOSIS — I951 Orthostatic hypotension: Secondary | ICD-10-CM

## 2011-11-04 DIAGNOSIS — K5730 Diverticulosis of large intestine without perforation or abscess without bleeding: Secondary | ICD-10-CM

## 2011-11-04 DIAGNOSIS — M949 Disorder of cartilage, unspecified: Secondary | ICD-10-CM

## 2011-11-04 DIAGNOSIS — I2510 Atherosclerotic heart disease of native coronary artery without angina pectoris: Secondary | ICD-10-CM

## 2011-11-04 MED ORDER — MIDODRINE HCL 2.5 MG PO TABS: 2.5000 mg | ORAL_TABLET | Freq: Three times a day (TID) | ORAL | Status: DC

## 2011-11-04 MED ORDER — NITROGLYCERIN 0.4 MG SL SUBL: 0.4000 mg | SUBLINGUAL_TABLET | SUBLINGUAL | Status: DC | PRN

## 2011-11-04 NOTE — Progress Notes (Deleted)
Name: [name]    DOB: Jul 19, 1925  Age: 78 y.o.  MR#: [id number]       PCP:  Colette Ribas, MD      Insurance: @PAYORNAME @   CC:   No chief complaint on file.   VS BP 175/78  Pulse 50  Ht 5' 4.5" (1.638 m)  Wt 115 lb (52.164 kg)  BMI 19.43 kg/m2  Weights Current Weight  11/04/11 115 lb (52.164 kg)  09/17/11 119 lb (53.978 kg)  04/02/11 114 lb (51.71 kg)    Blood Pressure  BP Readings from Last 3 Encounters:  11/04/11 175/78  10/10/11 143/82  10/10/11 143/82     Admit date:  (Not on file) Last encounter with RMR:  05/09/2011   Allergy Allergies  Allergen Reactions  . Cephalexin   . Contrast Media (Iodinated Diagnostic Agents)   . Iohexol     Urticaria, emesis, numbness during IVP performed at [facility] years ago  . Penicillins   . Shellfish Allergy   . Sulfa Antibiotics     Current Outpatient Prescriptions  Medication Sig Dispense Refill  . acetaminophen (TYLENOL) 325 MG tablet Take 650 mg by mouth every 6 (six) hours as needed. For pain      . alendronate (FOSAMAX) 70 MG tablet Take 70 mg by mouth every 7 (seven) days. Take with a full glass of water on an empty stomach.      Marland Kitchen aspirin 81 MG tablet Take 81 mg by mouth daily.        . Calcium-Vitamin D (CALTRATE 600 PLUS-VIT D PO) Take 2 tablets by mouth daily.       Marland Kitchen dextromethorphan-guaiFENesin (MUCINEX DM) 30-600 MG per 12 hr tablet Take 1 tablet by mouth every 12 (twelve) hours.        Marland Kitchen donepezil (ARICEPT) 10 MG tablet Take 10 mg by mouth at bedtime.       . ferrous sulfate 325 (65 FE) MG tablet Take 1 tablet (325 mg total) by mouth 2 (two) times daily with a meal.    3  . gabapentin (NEURONTIN) 100 MG capsule Take 100 mg by mouth 3 (three) times daily.      Marland Kitchen LIPITOR 10 MG tablet TAKE ONE (1) TABLET EACH DAY  30 tablet  6  . midodrine (PROAMATINE) 10 MG tablet Take 10 mg by mouth 3 (three) times daily.       . Omega-3 Fatty Acids (FISH OIL) 1200 MG CAPS Take 1,200 mg by mouth daily.      .  vitamin B-12 (CYANOCOBALAMIN) 1000 MCG tablet Take 1,000 mcg by mouth daily.      . vitamin C (ASCORBIC ACID) 500 MG tablet Take 500 mg by mouth daily.      Marland Kitchen DISCONTD: METOPROLOL SUCCINATE ER PO Take by mouth.        Discontinued Meds:    Medications Discontinued During This Encounter  Medication Reason  . Multiple Vitamins-Minerals (MULTIVITAMIN WITH MINERALS) tablet Error  . nitroGLYCERIN (NITROSTAT) 0.4 MG SL tablet Error  . Ascorbic Acid (VITAMIN C) 1000 MG tablet Error  . vitamin B-12 (CYANOCOBALAMIN) 250 MCG tablet Error  . Omega-3 Fatty Acids (FISH OIL) 1000 MG CAPS Error    Patient Active Problem List  Diagnosis  . HYPERLIPIDEMIA  . TOBACCO ABUSE  . Orthostatic hypotension  . GERD  . DIVERTICULOSIS, ASYMPTOMATIC  . OSTEOPENIA  . Arteriosclerotic cardiovascular disease (ASCVD)  . Cerebrovascular disease  . Syncope  . Laboratory test  LABS No visits with results within 3 Month(s) from this visit. Latest known visit with results is:  Orders Only on 05/08/2011  Component Date Value  . WBC 05/08/2011 7.3   . RBC 05/08/2011 4.57   . Hemoglobin 05/08/2011 11.4*  . HCT 05/08/2011 39.1   . MCV 05/08/2011 85.6   . MCH 05/08/2011 24.9*  . MCHC 05/08/2011 29.2*  . RDW 05/08/2011 15.3   . Platelets 05/08/2011 336   . Cholesterol 05/08/2011 160   . Triglycerides 05/08/2011 91   . HDL 05/08/2011 67   . Total CHOL/HDL Ratio 05/08/2011 2.4   . VLDL 05/08/2011 18   . LDL Cholesterol 05/08/2011 75      Results for this Opt Visit:     Results for orders placed in visit on 05/08/11  CBC      Component Value Range   WBC 7.3  4.0 - 10.5 K/uL   RBC 4.57  3.87 - 5.11 MIL/uL   Hemoglobin 11.4 (*) 12.0 - 15.0 g/dL   HCT 78.4  69.6 - 29.5 %   MCV 85.6  78.0 - 100.0 fL   MCH 24.9 (*) 26.0 - 34.0 pg   MCHC 29.2 (*) 30.0 - 36.0 g/dL   RDW 28.4  13.2 - 44.0 %   Platelets 336  150 - 400 K/uL  LIPID PANEL      Component Value Range   Cholesterol 160  0 - 200 mg/dL    Triglycerides 91  <102 mg/dL   HDL 67  >72 mg/dL   Total CHOL/HDL Ratio 2.4     VLDL 18  0 - 40 mg/dL   LDL Cholesterol 75  0 - 99 mg/dL    EKG No orders found for this or any previous visit.   Prior Assessment and Plan Problem List as of 11/04/2011            Cardiology Problems   HYPERLIPIDEMIA   Last Assessment & Plan Note   04/02/2011 Office Visit Signed 04/02/2011  2:37 PM by Kathlen Brunswick, MD    Repeat lipid profile will be obtained and medication adjusted if necessary.    Orthostatic hypotension   Last Assessment & Plan Note   04/02/2011 Office Visit Signed 04/02/2011  2:37 PM by Kathlen Brunswick, MD    Patient has had an excellent response to Midodrine with only minimal residual orthostatic symptoms.  Attempt to taper drug can be instituted after 6-12 months of therapy.     Arteriosclerotic cardiovascular disease (ASCVD)   Last Assessment & Plan Note   04/02/2011 Office Visit Signed 04/03/2011  8:09 PM by Kathlen Brunswick, MD    No symptoms to suggest recurrent myocardial ischemia in the 6 years since CABG.    Cerebrovascular disease   Last Assessment & Plan Note   05/10/2010 Office Visit Signed 05/10/2010  1:00 PM by Kathlen Brunswick, MD    Patient failed to keep her appointment for carotid ultrasound last year.  She agrees to undergo this test in the near future.    Syncope     Other   TOBACCO ABUSE   GERD   DIVERTICULOSIS, ASYMPTOMATIC   OSTEOPENIA   Laboratory test       Imaging: No results found.   FRS Calculation: Score not calculated

## 2011-11-04 NOTE — Progress Notes (Signed)
Patient ID: [name], female   DOB: [birth year], 78 y.o.   MRN: [id number]  HPI: Scheduled return visit for this very nice woman with coronary artery disease and orthostatic hypotension.  Since her last visit, she has done fairly well.  She was found to have anemia and underwent upper and lower endoscopy, apparently with negative results.  She has known diverticular disease, but active bleeding was not demonstrated.  She continues to have diarrhea attributed to irritable bowel syndrome.  She reports vague anterior chest pressure, typically associated with mild exertion, which passes within 20-30 minutes.  She has not had nitroglycerin available to her.  She has noted intermittent pedal edema, worse on the left, that resolves with leg elevation.  Prior to Admission medications   Medication Sig Start Date End Date Taking? Authorizing Provider  acetaminophen (TYLENOL) 325 MG tablet Take 650 mg by mouth every 6 (six) hours as needed. For pain   Yes Historical Provider, MD  alendronate (FOSAMAX) 70 MG tablet Take 70 mg by mouth every 7 (seven) days. Take with a full glass of water on an empty stomach.   Yes Historical Provider, MD  aspirin 81 MG tablet Take 81 mg by mouth daily.     Yes Historical Provider, MD  Calcium-Vitamin D (CALTRATE 600 PLUS-VIT D PO) Take 2 tablets by mouth daily.    Yes Historical Provider, MD  dextromethorphan-guaiFENesin (MUCINEX DM) 30-600 MG per 12 hr tablet Take 1 tablet by mouth every 12 (twelve) hours.     Yes Historical Provider, MD  donepezil (ARICEPT) 10 MG tablet Take 10 mg by mouth at bedtime.    Yes Historical Provider, MD  ferrous sulfate 325 (65 FE) MG tablet Take 1 tablet (325 mg total) by mouth 2 (two) times daily with a meal. 10/10/11  Yes Malissa Hippo, MD  gabapentin (NEURONTIN) 100 MG capsule Take 100 mg by mouth 3 (three) times daily.   Yes Historical Provider, MD  LIPITOR 10 MG tablet TAKE ONE (1) TABLET EACH DAY 05/07/11  Yes Kathlen Brunswick, MD    midodrine (PROAMATINE) 10 MG tablet Take 10 mg by mouth 3 (three) times daily.    Yes Historical Provider, MD  Omega-3 Fatty Acids (FISH OIL) 1200 MG CAPS Take 1,200 mg by mouth daily.   Yes Historical Provider, MD  vitamin B-12 (CYANOCOBALAMIN) 1000 MCG tablet Take 1,000 mcg by mouth daily.   Yes Historical Provider, MD  vitamin C (ASCORBIC ACID) 500 MG tablet Take 500 mg by mouth daily.   Yes Historical Provider, MD   Allergies  Allergen Reactions  . Cephalexin   . Contrast Media (Iodinated Diagnostic Agents)   . Iohexol     Urticaria, emesis, numbness during IVP performed at [facility] years ago  . Penicillins   . Shellfish Allergy   . Sulfa Antibiotics      Past medical history, social history, and family history reviewed and updated.  ROS: Denies  PHYSICAL EXAM: BP 175/78  Pulse 50  Ht 5' 4.5" (1.638 m)  Wt 52.164 kg (115 lb)  BMI 19.43 kg/m2   General-Well developed; no acute distress Body habitus-Thin Neck-No JVD; Bilateral carotid bruits Lungs-clear lung fields; resonant to percussion; decreased breath sounds Cardiovascular-normal PMI; normal S1 and S2; prominent fourth heart sound; somewhat irregular; minimal systolic murmur Abdomen-normal bowel sounds; soft and non-tender without masses or organomegaly Musculoskeletal-No deformities, no cyanosis or clubbing Neurologic-Normal cranial nerves; symmetric strength and tone Skin-Warm, no significant lesions Extremities-distal pulses intact; Trivial,  if any, edema  EKG:  Sinus bradycardia at a rate of 48 bpm; U waves noted.  ASSESSMENT AND PLAN:  [place] Bing, MD 11/04/2011 2:45 PM

## 2011-11-04 NOTE — Patient Instructions (Addendum)
Your physician recommends that you schedule a follow-up appointment in: 3 weeks  Your physician has recommended you make the following change in your medication:  1 - DECREASE Midodrine to 2.5 mg three times a day 2 - Nitroglycerin 0.4 mg under tongue every 5 minutes x 3 for chest pain - If your chest pain does not subside after 3rd dose, seek medical attention.   Your physician has requested that you have a lexiscan myoview. For further information please visit [email or web address] Please follow instruction sheet, as given.

## 2011-11-04 NOTE — Assessment & Plan Note (Signed)
No orthostatic change in blood pressure noted today despite persistent symptoms.  Resting hypertension is present.  Accordingly, Midrin will be decreased to 2.5 mg 3 times per day.

## 2011-11-04 NOTE — Assessment & Plan Note (Signed)
Current symptoms of chest discomfort are somewhat worrisome for myocardial ischemia.  Sublingual nitroglycerin provided and pharmacologic stress nuclear study ordered.  I will reassess this nice woman 3 weeks.

## 2011-11-06 ENCOUNTER — Encounter (INDEPENDENT_AMBULATORY_CARE_PROVIDER_SITE_OTHER): Payer: Self-pay | Admitting: *Deleted

## 2011-11-06 ENCOUNTER — Telehealth (INDEPENDENT_AMBULATORY_CARE_PROVIDER_SITE_OTHER): Payer: Self-pay | Admitting: *Deleted

## 2011-11-06 DIAGNOSIS — D509 Iron deficiency anemia, unspecified: Secondary | ICD-10-CM

## 2011-11-06 NOTE — Telephone Encounter (Signed)
Lab work due 

## 2011-11-09 LAB — HEMOGLOBIN AND HEMATOCRIT, BLOOD
HCT: 39.9 % (ref 36.0–46.0)
Hemoglobin: 13.5 g/dL (ref 12.0–15.0)

## 2011-11-12 ENCOUNTER — Encounter (HOSPITAL_COMMUNITY)
Admission: RE | Admit: 2011-11-12 | Discharge: 2011-11-12 | Disposition: A | Discharge status: Home / Self Care | Payer: Medicare Other | Source: Ambulatory Visit | Attending: Cardiology | Admitting: Cardiology

## 2011-11-12 ENCOUNTER — Ambulatory Visit (HOSPITAL_COMMUNITY)
Admission: RE | Admit: 2011-11-12 | Discharge: 2011-11-12 | Disposition: A | Discharge status: Home / Self Care | Payer: Medicare Other | Source: Ambulatory Visit | Attending: Cardiology | Admitting: Cardiology

## 2011-11-12 ENCOUNTER — Encounter (HOSPITAL_COMMUNITY): Payer: Self-pay

## 2011-11-12 ENCOUNTER — Encounter (HOSPITAL_COMMUNITY): Payer: Self-pay | Admitting: Cardiology

## 2011-11-12 DIAGNOSIS — I2510 Atherosclerotic heart disease of native coronary artery without angina pectoris: Secondary | ICD-10-CM | POA: Insufficient documentation

## 2011-11-12 DIAGNOSIS — R079 Chest pain, unspecified: Secondary | ICD-10-CM | POA: Insufficient documentation

## 2011-11-12 HISTORY — DX: Essential (primary) hypertension: I10

## 2011-11-12 MED ORDER — TECHNETIUM TC 99M SESTAMIBI - CARDIOLITE
10.0000 | Freq: Once | INTRAVENOUS | Status: AC | PRN
  Administered 2011-11-12: 9.7 via INTRAVENOUS

## 2011-11-12 MED ORDER — REGADENOSON 0.4 MG/5ML IV SOLN
INTRAVENOUS | Status: AC
  Administered 2011-11-12: 0.4 mg via INTRAVENOUS
  Filled 2011-11-12: qty 5

## 2011-11-12 MED ORDER — TECHNETIUM TC 99M SESTAMIBI - CARDIOLITE
30.0000 | Freq: Once | INTRAVENOUS | Status: AC | PRN
  Administered 2011-11-12: 30 via INTRAVENOUS

## 2011-11-12 MED ORDER — SODIUM CHLORIDE 0.9 % IJ SOLN
INTRAMUSCULAR | Status: AC
  Administered 2011-11-12: 10 mL via INTRAVENOUS
  Filled 2011-11-12: qty 10

## 2011-11-12 NOTE — Progress Notes (Signed)
Stress Lab Nurses Notes - [name]  [name] 11/12/2011 Reason for doing test: CAD and Chest Pain Type of test: [name] Nurse performing test: Parke Poisson, RN Nuclear Medicine Tech: Lyndel Pleasure Echo Tech: Not Applicable MD performing test: R. Rothbart & Joni Reining NP Family MD: Phillips Odor Test explained and consent signed: yes IV started: 22g jelco, Saline lock flushed, No redness or edema and Saline lock started in radiology Symptoms: Dizziness Treatment/Intervention: None Reason test stopped: protocol completed After recovery IV was: Discontinued via X-ray tech and No redness or edema Patient to return to Nuc. Med at : 11:00 Patient discharged: Home Patient's Condition upon discharge was: stable Comments: During test BP 182/80 & HR 85.  Recovery BP 164/84 & HR 75.  Symptoms resolved in recovery. [name]

## 2011-11-25 ENCOUNTER — Encounter (INDEPENDENT_AMBULATORY_CARE_PROVIDER_SITE_OTHER): Payer: Self-pay

## 2011-11-25 ENCOUNTER — Ambulatory Visit (INDEPENDENT_AMBULATORY_CARE_PROVIDER_SITE_OTHER): Payer: Medicare Other | Admitting: Cardiology

## 2011-11-25 ENCOUNTER — Encounter: Payer: Self-pay | Admitting: Cardiology

## 2011-11-25 VITALS — BP 140/90 | HR 55 | Ht 64.0 in | Wt 115.8 lb

## 2011-11-25 DIAGNOSIS — I679 Cerebrovascular disease, unspecified: Secondary | ICD-10-CM

## 2011-11-25 DIAGNOSIS — E785 Hyperlipidemia, unspecified: Secondary | ICD-10-CM

## 2011-11-25 DIAGNOSIS — D649 Anemia, unspecified: Secondary | ICD-10-CM

## 2011-11-25 DIAGNOSIS — I951 Orthostatic hypotension: Secondary | ICD-10-CM

## 2011-11-25 DIAGNOSIS — I2510 Atherosclerotic heart disease of native coronary artery without angina pectoris: Secondary | ICD-10-CM

## 2011-11-25 DIAGNOSIS — I7090 Unspecified atherosclerosis: Secondary | ICD-10-CM

## 2011-11-25 NOTE — Assessment & Plan Note (Signed)
Most recent carotid ultrasound in 2010 revealed atherosclerosis.  Repeat study will be obtained and will likely be the last study required if there has been no significant progression of disease.

## 2011-11-25 NOTE — Assessment & Plan Note (Signed)
Chest pain has resolved spontaneously.  Stress nuclear study was negative.  No further testing or treatment required.

## 2011-11-25 NOTE — Assessment & Plan Note (Signed)
No significant anemia and no apparent source of occult blood loss.  Iron supplement will be decreased and then discontinued entirely if CBC remains normal.

## 2011-11-25 NOTE — Assessment & Plan Note (Signed)
Recent lipid profile was excellent; current therapy will be continued. 

## 2011-11-25 NOTE — Progress Notes (Signed)
Name: [name]    DOB: [birth year]  Age: 78 y.o.  MR#: [id number]       PCP:  Colette Ribas, MD      Insurance: @PAYORNAME @   CC:   No chief complaint on file.  MEDICATION LIST REVIEWED [place] ON 11/14/11  VS BP 130/84  Pulse 66  Ht 5\' 4"  (1.626 m)  Wt 115 lb 12 oz (52.504 kg)  BMI 19.87 kg/m2  SpO2 95%  Weights Current Weight  11/25/11 115 lb 12 oz (52.504 kg)  11/04/11 115 lb (52.164 kg)  09/17/11 119 lb (53.978 kg)    Blood Pressure  BP Readings from Last 3 Encounters:  11/25/11 130/84  11/04/11 175/78  10/10/11 143/82     Admit date:  (Not on file) Last encounter with RMR:  11/04/2011   Allergy Allergies  Allergen Reactions  . Cephalexin   . Contrast Media (Iodinated Diagnostic Agents)   . Iohexol     Urticaria, emesis, numbness during IVP performed at [facility] years ago  . Penicillins   . Shellfish Allergy   . Sulfa Antibiotics     Current Outpatient Prescriptions  Medication Sig Dispense Refill  . acetaminophen (TYLENOL) 325 MG tablet Take 650 mg by mouth every 6 (six) hours as needed. For pain      . alendronate (FOSAMAX) 70 MG tablet Take 70 mg by mouth every 7 (seven) days. Take with a full glass of water on an empty stomach.      Marland Kitchen aspirin 81 MG tablet Take 81 mg by mouth daily.        . Calcium-Vitamin D (CALTRATE 600 PLUS-VIT D PO) Take 2 tablets by mouth daily.       Marland Kitchen dextromethorphan-guaiFENesin (MUCINEX DM) 30-600 MG per 12 hr tablet Take 1 tablet by mouth every 12 (twelve) hours.        Marland Kitchen donepezil (ARICEPT) 10 MG tablet Take 10 mg by mouth at bedtime.       . ferrous sulfate 325 (65 FE) MG tablet Take 1 tablet (325 mg total) by mouth 2 (two) times daily with a meal.    3  . gabapentin (NEURONTIN) 100 MG capsule Take 100 mg by mouth 3 (three) times daily.      Marland Kitchen LIPITOR 10 MG tablet TAKE ONE (1) TABLET EACH DAY  30 tablet  6  . midodrine (PROAMATINE) 2.5 MG tablet Take 1 tablet (2.5 mg total) by mouth 3 (three) times daily.   90 tablet  6  . nitroGLYCERIN (NITROSTAT) 0.4 MG SL tablet Place 1 tablet (0.4 mg total) under the tongue every 5 (five) minutes as needed for chest pain.  90 tablet  3  . Omega-3 Fatty Acids (FISH OIL) 1200 MG CAPS Take 1,200 mg by mouth daily.      . vitamin B-12 (CYANOCOBALAMIN) 1000 MCG tablet Take 1,000 mcg by mouth daily.      . vitamin C (ASCORBIC ACID) 500 MG tablet Take 500 mg by mouth daily.      . [DISCONTINUED] METOPROLOL SUCCINATE ER PO Take by mouth.        Discontinued Meds:   There are no discontinued medications.  Patient Active Problem List  Diagnosis  . HYPERLIPIDEMIA  . TOBACCO ABUSE  . Orthostatic hypotension  . GERD  . DIVERTICULOSIS, ASYMPTOMATIC  . OSTEOPENIA  . Arteriosclerotic cardiovascular disease (ASCVD)  . Cerebrovascular disease  . Syncope  . Laboratory test    LABS Telephone on 11/06/2011  Component Date  Value  . Hemoglobin 11/09/2011 13.5   . HCT 11/09/2011 39.9      Results for this Opt Visit:     Results for orders placed in visit on 11/06/11  HEMOGLOBIN AND HEMATOCRIT, BLOOD      Component Value Range   Hemoglobin 13.5  12.0 - 15.0 g/dL   HCT 45.4  09.8 - 11.9 %    EKG No orders found for this or any previous visit.   Prior Assessment and Plan Problem List as of 11/25/2011            Cardiology Problems   HYPERLIPIDEMIA   Last Assessment & Plan Note   04/02/2011 Office Visit Signed 04/02/2011  2:37 PM by Kathlen Brunswick, MD    Repeat lipid profile will be obtained and medication adjusted if necessary.    Orthostatic hypotension   Last Assessment & Plan Note   11/04/2011 Office Visit Signed 11/04/2011  7:52 PM by Kathlen Brunswick, MD    No orthostatic change in blood pressure noted today despite persistent symptoms.  Resting hypertension is present.  Accordingly, Midrin will be decreased to 2.5 mg 3 times per day.    Arteriosclerotic cardiovascular disease (ASCVD)   Last Assessment & Plan Note   11/04/2011 Office Visit  Signed 11/04/2011  7:53 PM by Kathlen Brunswick, MD    Current symptoms of chest discomfort are somewhat worrisome for myocardial ischemia.  Sublingual nitroglycerin provided and pharmacologic stress nuclear study ordered.  I will reassess this nice woman 3 weeks.    Cerebrovascular disease   Last Assessment & Plan Note   05/10/2010 Office Visit Signed 05/10/2010  1:00 PM by Kathlen Brunswick, MD    Patient failed to keep her appointment for carotid ultrasound last year.  She agrees to undergo this test in the near future.    Syncope     Other   TOBACCO ABUSE   GERD   DIVERTICULOSIS, ASYMPTOMATIC   OSTEOPENIA   Laboratory test       Imaging: Nm Myocar Single W/spect W/wall Motion And Ef  11/14/2011  Ordering Physician: [place] Bing  Reading Physician: [place] Bing  Clinical Data: 78 year old woman with coronary artery disease and chest discomfort, generally related to exertion.  NUCLEAR MEDICINE STRESS MYOVIEW STUDY WITH SPECT AND LEFT VENTRICULAR EJECTION FRACTION  Radionuclide Data: One-day rest/stress protocol performed with 10/30 mCi of Tc-65m Myoview.  Stress Data: Regadenoson infusion resulted in a moderate increase in heart rate but a substantial increase in systolic blood pressure as well.  No significant arrhythmias were noted nor symptoms reported. Moderately frequent PVCs occurred following drug administration  EKG: Sinus bradycardia at a rate of 48 bpm; nonspecific ST-segment abnormality.  No significant change with pharmacologic stress, although there was an appreciable increase in baseline ST-segment depression.  Scintigraphic Data: Acquisition notable for moderate GI activity adjacent to the inferior myocardium.  Left ventricular size was normal.  On tomographic images reconstructed in standard planes, there was modestly decreased tracer uptake in a very small segment of the mid and distal septum, which was of borderline numeric significance by quantitative analysis and for  which no reversibility was apparent.  The gated reconstruction demonstrated normal to hyperdynamic regional and global LV systolic function as well as normal systolic accentuation of activity throughout. Estimated ejection fraction exceeded 65%.  IMPRESSION: Negative pharmacologic stress nuclear myocardial study.   Original Report Authenticated By: Phillis Haggis Calculation: Score not calculated

## 2011-11-25 NOTE — Assessment & Plan Note (Signed)
Minimal dizziness with no significant orthostatic change in blood pressure on a fairly small dose of midodrine.  We will attempt to discontinue at her next visit.

## 2011-11-25 NOTE — Progress Notes (Signed)
Patient ID: [name], female   DOB: [birth year], 78 y.o.   MRN: [id number]  HPI: Scheduled return visit for this very nice woman with orthostatic hypotension.  Since her last visit, she has done quite well.  She walks with a cane, but has not experienced dizziness or gait instability.  She recently traveled to [place] without difficulty.  Upper and lower endoscopy performed for anemia and were negative.  Prior to Admission medications   Medication Sig Start Date End Date Taking? Authorizing Provider  acetaminophen (TYLENOL) 325 MG tablet Take 650 mg by mouth every 6 (six) hours as needed. For pain   Yes Historical Provider, MD  alendronate (FOSAMAX) 70 MG tablet Take 70 mg by mouth every 7 (seven) days. Take with a full glass of water on an empty stomach.   Yes Historical Provider, MD  aspirin 81 MG tablet Take 81 mg by mouth daily.     Yes Historical Provider, MD  Calcium-Vitamin D (CALTRATE 600 PLUS-VIT D PO) Take 2 tablets by mouth daily.    Yes Historical Provider, MD  dextromethorphan-guaiFENesin (MUCINEX DM) 30-600 MG per 12 hr tablet Take 1 tablet by mouth every 12 (twelve) hours.     Yes Historical Provider, MD  donepezil (ARICEPT) 10 MG tablet Take 10 mg by mouth at bedtime.    Yes Historical Provider, MD  ferrous sulfate 325 (65 FE) MG tablet Take 1 tablet (325 mg total) by mouth 2 (two) times daily with a meal. 10/10/11  Yes Malissa Hippo, MD  gabapentin (NEURONTIN) 100 MG capsule Take 100 mg by mouth 3 (three) times daily.   Yes Historical Provider, MD  LIPITOR 10 MG tablet TAKE ONE (1) TABLET EACH DAY 05/07/11  Yes Kathlen Brunswick, MD  midodrine (PROAMATINE) 2.5 MG tablet Take 1 tablet (2.5 mg total) by mouth 3 (three) times daily. 11/04/11  Yes Kathlen Brunswick, MD  nitroGLYCERIN (NITROSTAT) 0.4 MG SL tablet Place 1 tablet (0.4 mg total) under the tongue every 5 (five) minutes as needed for chest pain. 11/04/11  Yes Kathlen Brunswick, MD  Omega-3 Fatty Acids (FISH OIL)  1200 MG CAPS Take 1,200 mg by mouth daily.   Yes Historical Provider, MD  vitamin B-12 (CYANOCOBALAMIN) 1000 MCG tablet Take 1,000 mcg by mouth daily.   Yes Historical Provider, MD  vitamin C (ASCORBIC ACID) 500 MG tablet Take 500 mg by mouth daily.   Yes Historical Provider, MD   Allergies  Allergen Reactions  . Cephalexin   . Contrast Media (Iodinated Diagnostic Agents)   . Iohexol     Urticaria, emesis, numbness during IVP performed at [facility] years ago  . Penicillins   . Shellfish Allergy   . Sulfa Antibiotics      Past medical history, social history, and family history reviewed and updated.  ROS: Denies orthopnea, PND, chest pain, dyspnea, pedal edema, palpitations or syncope.  All other systems reviewed and are negative.  PHYSICAL EXAM: BP 140/90  Pulse 55  Ht 5\' 4"  (1.626 m)  Wt 52.504 kg (115 lb 12 oz)  BMI 19.87 kg/m2  SpO2 95% ; no orthostatic change in blood pressure General-Well developed; no acute distress Body habitus-Thin Neck-No JVD; no carotid bruits Lungs-clear lung fields; resonant to percussion; decreased breath sounds at the left base Cardiovascular-normal PMI; Modestly increased intensity of S1 and normal S2 Abdomen-normal bowel sounds; soft and non-tender without masses or organomegaly Musculoskeletal-No deformities, no cyanosis or clubbing Neurologic-Normal cranial nerves; symmetric strength and  tone Skin-Warm, no significant lesions Extremities-distal pulses intact; no edema  ASSESSMENT AND PLAN:  [name], MD 11/25/2011 4:04 PM

## 2011-11-25 NOTE — Patient Instructions (Addendum)
Your physician recommends that you schedule a follow-up appointment in: 3 months  Your physician has recommended you make the following change in your medication:  1 - DECREASE Iron to once daily  Your physician has requested that you have a carotid duplex. This test is an ultrasound of the carotid arteries in your neck. It looks at blood flow through these arteries that supply the brain with blood. Allow one hour for this exam. There are no restrictions or special instructions.

## 2011-12-09 ENCOUNTER — Ambulatory Visit (HOSPITAL_COMMUNITY)
Admission: RE | Admit: 2011-12-09 | Discharge: 2011-12-09 | Disposition: A | Discharge status: Home / Self Care | Payer: Medicare Other | Source: Ambulatory Visit | Attending: Cardiology | Admitting: Cardiology

## 2011-12-09 DIAGNOSIS — I2510 Atherosclerotic heart disease of native coronary artery without angina pectoris: Secondary | ICD-10-CM | POA: Insufficient documentation

## 2011-12-09 DIAGNOSIS — D649 Anemia, unspecified: Secondary | ICD-10-CM | POA: Insufficient documentation

## 2011-12-09 DIAGNOSIS — I679 Cerebrovascular disease, unspecified: Secondary | ICD-10-CM | POA: Insufficient documentation

## 2011-12-09 DIAGNOSIS — I951 Orthostatic hypotension: Secondary | ICD-10-CM | POA: Insufficient documentation

## 2011-12-09 DIAGNOSIS — I7090 Unspecified atherosclerosis: Secondary | ICD-10-CM | POA: Insufficient documentation

## 2011-12-09 DIAGNOSIS — E785 Hyperlipidemia, unspecified: Secondary | ICD-10-CM | POA: Insufficient documentation

## 2011-12-12 ENCOUNTER — Encounter: Payer: Self-pay | Admitting: Cardiology

## 2012-02-05 ENCOUNTER — Ambulatory Visit (INDEPENDENT_AMBULATORY_CARE_PROVIDER_SITE_OTHER): Payer: Medicare Other | Admitting: Cardiology

## 2012-02-05 ENCOUNTER — Encounter: Payer: Self-pay | Admitting: Cardiology

## 2012-02-05 ENCOUNTER — Ambulatory Visit: Payer: Medicare Other | Admitting: Cardiology

## 2012-02-05 VITALS — BP 160/98 | HR 56 | Ht 64.0 in | Wt 115.0 lb

## 2012-02-05 DIAGNOSIS — K219 Gastro-esophageal reflux disease without esophagitis: Secondary | ICD-10-CM

## 2012-02-05 DIAGNOSIS — I951 Orthostatic hypotension: Secondary | ICD-10-CM

## 2012-02-05 DIAGNOSIS — I7090 Unspecified atherosclerosis: Secondary | ICD-10-CM

## 2012-02-05 DIAGNOSIS — D649 Anemia, unspecified: Secondary | ICD-10-CM

## 2012-02-05 DIAGNOSIS — I679 Cerebrovascular disease, unspecified: Secondary | ICD-10-CM

## 2012-02-05 DIAGNOSIS — I2510 Atherosclerotic heart disease of native coronary artery without angina pectoris: Secondary | ICD-10-CM

## 2012-02-05 MED ORDER — MECLIZINE HCL 25 MG PO TABS: 25.0000 mg | ORAL_TABLET | Freq: Three times a day (TID) | ORAL | Status: DC | PRN

## 2012-02-05 NOTE — Progress Notes (Signed)
Patient ID: [name], female   DOB: 1925/09/07, 79 y.o.   MRN: [id number]  HPI: Scheduled return visit for this nice woman with dizziness, hypertension, a history of orthostatic hypotension and chest pain.  Stress nuclear study was negative, and as needed Nitroglycerin has adequately controlled chest discomfort.  She continues to have intermittent dizziness on a near-daily basis that is not clearly orthostatic.  Prior to Admission medications   Medication Sig Start Date End Date Taking? Authorizing Provider  acetaminophen (TYLENOL) 325 MG tablet Take 650 mg by mouth every 6 (six) hours as needed. For pain   Yes Historical Provider, MD  alendronate (FOSAMAX) 70 MG tablet Take 70 mg by mouth every 7 (seven) days. Take with a full glass of water on an empty stomach.   Yes Historical Provider, MD  aspirin 81 MG tablet Take 81 mg by mouth daily.     Yes Historical Provider, MD  Calcium-Vitamin D (CALTRATE 600 PLUS-VIT D PO) Take 2 tablets by mouth daily.    Yes Historical Provider, MD  dextromethorphan-guaiFENesin (MUCINEX DM) 30-600 MG per 12 hr tablet Take 1 tablet by mouth every 12 (twelve) hours.     Yes Historical Provider, MD  donepezil (ARICEPT) 10 MG tablet Take 10 mg by mouth at bedtime.    Yes Historical Provider, MD  ferrous sulfate 325 (65 FE) MG tablet Take 325 mg by mouth daily with breakfast. 10/10/11  Yes Malissa Hippo, MD  gabapentin (NEURONTIN) 100 MG capsule Take 100 mg by mouth 3 (three) times daily.   Yes Historical Provider, MD  LIPITOR 10 MG tablet TAKE ONE (1) TABLET EACH DAY 05/07/11  Yes Kathlen Brunswick, MD  nitroGLYCERIN (NITROSTAT) 0.4 MG SL tablet Place 1 tablet (0.4 mg total) under the tongue every 5 (five) minutes as needed for chest pain. 11/04/11  Yes Kathlen Brunswick, MD  Omega-3 Fatty Acids (FISH OIL) 1200 MG CAPS Take 1,200 mg by mouth daily.   Yes Historical Provider, MD  vitamin B-12 (CYANOCOBALAMIN) 1000 MCG tablet Take 1,000 mcg by mouth daily.   Yes  Historical Provider, MD  vitamin C (ASCORBIC ACID) 500 MG tablet Take 500 mg by mouth daily.   Yes Historical Provider, MD  meclizine (ANTIVERT) 25 MG tablet Take 1 tablet (25 mg total) by mouth 3 (three) times daily as needed (Take 1/2 tablet, if ineffective 25 mg). 02/05/12   Kathlen Brunswick, MD   Allergies  Allergen Reactions  . Cephalexin   . Contrast Media (Iodinated Diagnostic Agents)   . Iohexol     Urticaria, emesis, numbness during IVP performed at [facility] years ago  . Penicillins   . Shellfish Allergy   . Sulfa Antibiotics   Past medical history, social history, and family history reviewed and updated.  ROS: Denies orthopnea, PND, pedal edema, syncope, falls or exertional dyspnea.  Appetite has been normal, and weight has been stable.  All other systems reviewed and are negative.  PHYSICAL EXAM: BP 160/98  Pulse 56  Ht 5\' 4"  (1.626 m)  Wt 52.164 kg (115 lb)  BMI 19.74 kg/m2  SpO2 95%  General-Well developed; no acute distress Body habitus-underweight Neck-No JVD; no carotid bruits Lungs-clear lung fields; resonant to percussion Cardiovascular-normal PMI; normal S1 and S2; grade 1/6 holosystolic apical murmur Abdomen-normal bowel sounds; soft and non-tender without masses or organomegaly Musculoskeletal-No deformities, no cyanosis or clubbing Neurologic-Normal cranial nerves; symmetric strength and tone Skin-Warm, no significant lesions Extremities-distal pulses intact; no edema  ASSESSMENT  AND PLAN:  Kim Bing, MD 02/05/2012 7:31 PM

## 2012-02-05 NOTE — Patient Instructions (Addendum)
Your physician recommends that you schedule a follow-up appointment in:  1 - 1 week for blood pressure 2 - 1 month with Dr Dietrich Pates  Your physician has requested that you regularly monitor and record your blood pressure readings at home (lying and standing). Please use the same machine at the same time of day to check your readings and record them to bring to your follow-up visit.  Your physician has recommended you make the following change in your medication:  1 - STOP Midodrine 2 - START Antivert 12.5 mg three times daily as needed.  If ineffective, may take 25 mg.  STOP Sundrop

## 2012-02-05 NOTE — Progress Notes (Deleted)
Name: [name]    DOB: Aug 30, 1925  Age: 79 y.o.  MR#: [id number]       PCP:  Colette Ribas, MD      Insurance: @PAYORNAME @   CC:   No chief complaint on file.   VS BP 160/98  Pulse 56  Ht 5\' 4"  (1.626 m)  Wt 115 lb (52.164 kg)  BMI 19.74 kg/m2  SpO2 95%  Weights Current Weight  02/05/12 115 lb (52.164 kg)  11/25/11 115 lb 12 oz (52.504 kg)  11/04/11 115 lb (52.164 kg)    Blood Pressure  BP Readings from Last 3 Encounters:  02/05/12 160/98  11/25/11 140/90  11/04/11 175/78     Admit date:  (Not on file) Last encounter with RMR:  12/12/2011   Allergy Allergies  Allergen Reactions  . Cephalexin   . Contrast Media (Iodinated Diagnostic Agents)   . Iohexol     Urticaria, emesis, numbness during IVP performed at [facility] years ago  . Penicillins   . Shellfish Allergy   . Sulfa Antibiotics     Current Outpatient Prescriptions  Medication Sig Dispense Refill  . acetaminophen (TYLENOL) 325 MG tablet Take 650 mg by mouth every 6 (six) hours as needed. For pain      . alendronate (FOSAMAX) 70 MG tablet Take 70 mg by mouth every 7 (seven) days. Take with a full glass of water on an empty stomach.      Marland Kitchen aspirin 81 MG tablet Take 81 mg by mouth daily.        . Calcium-Vitamin D (CALTRATE 600 PLUS-VIT D PO) Take 2 tablets by mouth daily.       Marland Kitchen dextromethorphan-guaiFENesin (MUCINEX DM) 30-600 MG per 12 hr tablet Take 1 tablet by mouth every 12 (twelve) hours.        Marland Kitchen donepezil (ARICEPT) 10 MG tablet Take 10 mg by mouth at bedtime.       . ferrous sulfate 325 (65 FE) MG tablet Take 325 mg by mouth daily with breakfast.      . gabapentin (NEURONTIN) 100 MG capsule Take 100 mg by mouth 3 (three) times daily.      Marland Kitchen LIPITOR 10 MG tablet TAKE ONE (1) TABLET EACH DAY  30 tablet  6  . midodrine (PROAMATINE) 2.5 MG tablet Take 1 tablet (2.5 mg total) by mouth 3 (three) times daily.  90 tablet  6  . nitroGLYCERIN (NITROSTAT) 0.4 MG SL tablet Place 1 tablet (0.4 mg  total) under the tongue every 5 (five) minutes as needed for chest pain.  90 tablet  3  . Omega-3 Fatty Acids (FISH OIL) 1200 MG CAPS Take 1,200 mg by mouth daily.      . vitamin B-12 (CYANOCOBALAMIN) 1000 MCG tablet Take 1,000 mcg by mouth daily.      . vitamin C (ASCORBIC ACID) 500 MG tablet Take 500 mg by mouth daily.      . [DISCONTINUED] METOPROLOL SUCCINATE ER PO Take by mouth.        Discontinued Meds:   There are no discontinued medications.  Patient Active Problem List  Diagnosis  . Hyperlipidemia  . TOBACCO ABUSE  . Orthostatic hypotension  . GERD  . OSTEOPENIA  . Arteriosclerotic cardiovascular disease (ASCVD)  . Cerebrovascular disease  . Syncope  . Laboratory test  . Anemia    LABS Telephone on 11/06/2011  Component Date Value  . Hemoglobin 11/09/2011 13.5   . HCT 11/09/2011 39.9  Results for this Opt Visit:     Results for orders placed in visit on 11/06/11  HEMOGLOBIN AND HEMATOCRIT, BLOOD      Component Value Range   Hemoglobin 13.5  12.0 - 15.0 g/dL   HCT 82.9  56.2 - 13.0 %    EKG No orders found for this or any previous visit.   Prior Assessment and Plan Problem List as of 02/05/2012            Cardiology Problems   Hyperlipidemia   Last Assessment & Plan Note   11/25/2011 Office Visit Signed 11/25/2011  4:10 PM by Kathlen Brunswick, MD    Recent lipid profile was excellent; current therapy will be continued.    Orthostatic hypotension   Last Assessment & Plan Note   11/25/2011 Office Visit Signed 11/25/2011  4:03 PM by Kathlen Brunswick, MD    Minimal dizziness with no significant orthostatic change in blood pressure on a fairly small dose of midodrine.  We will attempt to discontinue at her next visit.    Arteriosclerotic cardiovascular disease (ASCVD)   Last Assessment & Plan Note   11/25/2011 Office Visit Signed 11/25/2011  4:09 PM by Kathlen Brunswick, MD    Chest pain has resolved spontaneously.  Stress nuclear study was  negative.  No further testing or treatment required.    Cerebrovascular disease   Last Assessment & Plan Note   11/25/2011 Office Visit Signed 11/25/2011  4:10 PM by Kathlen Brunswick, MD    Most recent carotid ultrasound in 2010 revealed atherosclerosis.  Repeat study will be obtained and will likely be the last study required if there has been no significant progression of disease.    Syncope     Other   TOBACCO ABUSE   GERD   OSTEOPENIA   Laboratory test   Anemia   Last Assessment & Plan Note   11/25/2011 Office Visit Signed 11/25/2011  4:08 PM by Kathlen Brunswick, MD    No significant anemia and no apparent source of occult blood loss.  Iron supplement will be decreased and then discontinued entirely if CBC remains normal.        Imaging: No results found.   FRS Calculation: Score not calculated

## 2012-02-05 NOTE — Assessment & Plan Note (Signed)
Patient continues to have chest discomfort, but based upon multiple stress tests, has apparently had a good result from CABG surgery 6-1/2 years ago.  We will continue to treat symptomatically and consider alternative potential etiologies such as gastroesophageal reflux disease.

## 2012-02-05 NOTE — Assessment & Plan Note (Signed)
Anemia has resolved as of the most recent CBC performed 11/2011.  H&H were also normal in 2012

## 2012-02-05 NOTE — Assessment & Plan Note (Signed)
Patient denies a history of acid reflux or hiatal hernia, but if chest discomfort persists, a trial of PPI could be considered.

## 2012-02-05 NOTE — Assessment & Plan Note (Signed)
Orthostatic hypotension appears to have resolved and been replaced by hypertension.  ProAmatine be discontinued and orthostatic vital signs followed.  Patient's description of current dizziness sounds more like vertigo.  A trial of meclizine will be initiated.

## 2012-02-05 NOTE — Assessment & Plan Note (Signed)
Cerebrovascular disease remains very mild and stable.  A repeat carotid ultrasound in 2 or 3 years would be reasonable.

## 2012-02-12 ENCOUNTER — Ambulatory Visit (INDEPENDENT_AMBULATORY_CARE_PROVIDER_SITE_OTHER): Payer: Medicare Other | Admitting: *Deleted

## 2012-02-12 VITALS — BP 161/91 | HR 68 | Ht 64.5 in | Wt 117.2 lb

## 2012-02-12 DIAGNOSIS — I951 Orthostatic hypotension: Secondary | ICD-10-CM

## 2012-02-12 NOTE — Patient Instructions (Addendum)
Your physician recommends that you schedule a follow-up appointment in: WE WILL CALL YOU WITH THE NEXT STEPS, KEEP YOUR FOLLOW UP APPOINTMENT WITH RR ON 03-06-12

## 2012-02-12 NOTE — Progress Notes (Signed)
Pt presented to [facility] per recent medication changes and for blood pressure check, no complaints noted today Pt son brought in BP that were checked twice daily since last OV noted 02-05-12 Pt son wanted a clarification on what pt blood pressure numbers should look like?   VS for [facility] today 02-12-12 Lying: 160/85 HR 56 Sitting: 165/86 HR 60 Standing: 161/91 HR 68   Pt BP recordings noted on index card range 150/70's to 170/85 range since 02-07-12 taken BID  Last VS/D/C Summary:  BP 160/98  Pulse 56  Ht 5\' 4"  (1.626 m)  Wt 52.164 kg (115 lb)  BMI 19.74 kg/m2  SpO2 95%   Your physician recommends that you schedule a follow-up appointment in:  1 - 1 week for blood pressure  2 - 1 month with Dr Dietrich Pates  Your physician has requested that you regularly monitor and record your blood pressure readings at home (lying and standing). Please use the same machine at the same time of day to check your readings and record them to bring to your follow-up visit.  Your physician has recommended you make the following change in your medication:  1 - STOP Midodrine  2 - START Antivert 12.5 mg three times daily as needed. If ineffective, may take 25 mg.  STOP Sundrop

## 2012-02-17 NOTE — Progress Notes (Signed)
Patient ID: [name], female   DOB: 15-Feb-1925, 79 y.o.   MRN: [id number]  Blood pressure values are acceptable for now.  Ultimately, we would like the systolics to be consistently less than 140.  This will be addressed at patient's next office visit.

## 2012-03-04 ENCOUNTER — Other Ambulatory Visit: Payer: Self-pay | Admitting: Cardiology

## 2012-03-06 ENCOUNTER — Encounter: Payer: Self-pay | Admitting: Cardiology

## 2012-03-06 ENCOUNTER — Ambulatory Visit (INDEPENDENT_AMBULATORY_CARE_PROVIDER_SITE_OTHER): Payer: Medicare Other | Admitting: Cardiology

## 2012-03-06 VITALS — BP 126/82 | HR 72 | Ht 64.5 in | Wt 115.0 lb

## 2012-03-06 DIAGNOSIS — I2510 Atherosclerotic heart disease of native coronary artery without angina pectoris: Secondary | ICD-10-CM

## 2012-03-06 DIAGNOSIS — I951 Orthostatic hypotension: Secondary | ICD-10-CM

## 2012-03-06 DIAGNOSIS — R55 Syncope and collapse: Secondary | ICD-10-CM

## 2012-03-06 DIAGNOSIS — I7090 Unspecified atherosclerosis: Secondary | ICD-10-CM

## 2012-03-06 DIAGNOSIS — E785 Hyperlipidemia, unspecified: Secondary | ICD-10-CM

## 2012-03-06 MED ORDER — MECLIZINE HCL 12.5 MG PO TABS: 12.5000 mg | ORAL_TABLET | Freq: Three times a day (TID) | ORAL | Status: DC | PRN

## 2012-03-06 NOTE — Progress Notes (Signed)
Patient ID: KEYMORA GRILLOT, female   DOB: 16-May-1925, 79 y.o.   MRN: [id number]  HPI: Schedule return visit for this very nice older woman with chest discomfort and lightheadedness. She has a history of orthostatic hypotension, but this has not been present of late. She also has hypertension, but recent blood pressures at home have been normal. Symptoms have improved with resolution of chest discomfort and a decrease in frequency and severity of dizziness. 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  Current Outpatient Prescriptions  Medication Sig Dispense Refill  . acetaminophen (TYLENOL) 325 MG tablet Take 650 mg by mouth every 6 (six) hours as needed. For pain      . alendronate (FOSAMAX) 70 MG tablet Take 70 mg by mouth every 7 (seven) days. Take with a full glass of water on an empty stomach.      Marland Kitchen aspirin 81 MG tablet Take 81 mg by mouth daily.        Marland Kitchen atorvastatin (LIPITOR) 10 MG tablet TAKE ONE (1) TABLET EACH DAY  30 tablet  6  . Calcium-Vitamin D (CALTRATE 600 PLUS-VIT D PO) Take 2 tablets by mouth daily.       Marland Kitchen donepezil (ARICEPT) 10 MG tablet Take 10 mg by mouth at bedtime.       . ferrous sulfate 325 (65 FE) MG tablet Take 325 mg by mouth daily with breakfast.      . gabapentin (NEURONTIN) 100 MG capsule Take 100 mg by mouth 3 (three) times daily.      . nitroGLYCERIN (NITROSTAT) 0.4 MG SL tablet Place 1 tablet (0.4 mg total) under the tongue every 5 (five) minutes as needed for chest pain.  90 tablet  3  . Omega-3 Fatty Acids (FISH OIL) 1200 MG CAPS Take 1,200 mg by mouth daily.      . vitamin B-12 (CYANOCOBALAMIN) 1000 MCG tablet Take 1,000 mcg by mouth daily.      . vitamin C (ASCORBIC ACID) 500 MG tablet Take 500 mg by mouth daily.      . meclizine (ANTIVERT) 25 MG tablet Take 1 tablet (25 mg  total) by mouth 3 (three) times daily as needed (Take 1/2 tablet, if ineffective 25 mg).  30 tablet  6  . [DISCONTINUED] METOPROLOL SUCCINATE ER PO Take by mouth.       No current facility-administered medications for this visit.   Allergies  Allergen Reactions  . Cephalexin   . Contrast Media (Iodinated Diagnostic Agents)   . Iohexol     Urticaria, emesis, numbness during IVP performed at [facility] years ago  . Penicillins   . Shellfish Allergy   . Sulfa Antibiotics    Past medical history, social history, and family history reviewed and updated.  ROS: Denies chest discomfort, orthopnea, PND, palpitations or syncope. She has had no recent falls. She experiences no nausea or emesis with dizziness. All other systems reviewed and are negative.  PHYSICAL EXAM: BP 130/74  Pulse 57  Ht 5' 4.5" (1.638 m)  Wt 52.218 kg (115 lb 1.9 oz)  BMI 19.46 kg/m2; weight is stable; no orthostatic change in blood pressure. Body mass index is 19.46 kg/(m^2). General-Well developed; no acute distress Body habitus-thin Neck-No JVD; no carotid bruits Lungs-clear lung fields; resonant to percussion Cardiovascular-normal PMI; normal  S1 and S2 Abdomen-normal bowel sounds; soft and non-tender without masses or organomegaly Musculoskeletal-No deformities, no cyanosis or clubbing Neurologic-Normal cranial nerves; symmetric strength and tone Skin-Warm, no significant lesions Extremities-distal pulses intact; no edema  EKG: Sinus bradycardia with a ventricular rate of 57 bpm; left atrial abnormality; possible prior anteroseptal myocardial infarction; LVH; ST-T wave abnormalities suggesting inferior ischemia or LVH. No previous tracing for comparison.  [place] Bing, MD 03/06/2012  2:10 PM  ASSESSMENT AND PLAN

## 2012-03-06 NOTE — Patient Instructions (Addendum)
Your physician recommends that you schedule a follow-up appointment in: 6 months  Your physician has recommended you make the following change in your medication:  1 - Take 12.5 mg 3 times a day for dizziness.  May take 2 tablets if ineffective.

## 2012-03-06 NOTE — Progress Notes (Deleted)
Name: [name]    DOB: [birth year]  Age: 79 y.o.  MR#: [id number]       PCP:  Colette Ribas, MD      Insurance: Payor: Cleatrice Burke MEDICARE  Plan: [employer] MEDICARE COMPLETE  Product Type: *No Product type*    CC:   No chief complaint on file.  MEDICATION LIST  VS Filed Vitals:   03/06/12 1331  BP: 130/74  Pulse: 57  Height: 5' 4.5" (1.638 m)  Weight: 115 lb 1.9 oz (52.218 kg)    Weights Current Weight  03/06/12 115 lb 1.9 oz (52.218 kg)  02/12/12 117 lb 4 oz (53.184 kg)  02/05/12 115 lb (52.164 kg)    Blood Pressure  BP Readings from Last 3 Encounters:  03/06/12 130/74  02/12/12 161/91  02/05/12 160/98     Admit date:  (Not on file) Last encounter with RMR:  03/04/2012   Allergy Cephalexin; Contrast media; Iohexol; Penicillins; Shellfish allergy; and Sulfa antibiotics  Current Outpatient Prescriptions  Medication Sig Dispense Refill  . acetaminophen (TYLENOL) 325 MG tablet Take 650 mg by mouth every 6 (six) hours as needed. For pain      . alendronate (FOSAMAX) 70 MG tablet Take 70 mg by mouth every 7 (seven) days. Take with a full glass of water on an empty stomach.      Marland Kitchen aspirin 81 MG tablet Take 81 mg by mouth daily.        Marland Kitchen atorvastatin (LIPITOR) 10 MG tablet TAKE ONE (1) TABLET EACH DAY  30 tablet  6  . Calcium-Vitamin D (CALTRATE 600 PLUS-VIT D PO) Take 2 tablets by mouth daily.       Marland Kitchen donepezil (ARICEPT) 10 MG tablet Take 10 mg by mouth at bedtime.       . ferrous sulfate 325 (65 FE) MG tablet Take 325 mg by mouth daily with breakfast.      . gabapentin (NEURONTIN) 100 MG capsule Take 100 mg by mouth 3 (three) times daily.      . nitroGLYCERIN (NITROSTAT) 0.4 MG SL tablet Place 1 tablet (0.4 mg total) under the tongue every 5 (five) minutes as needed for chest pain.  90 tablet  3  . Omega-3 Fatty Acids (FISH OIL) 1200 MG CAPS Take 1,200 mg by mouth daily.      . vitamin B-12 (CYANOCOBALAMIN) 1000 MCG tablet Take 1,000 mcg by mouth daily.      .  vitamin C (ASCORBIC ACID) 500 MG tablet Take 500 mg by mouth daily.      . meclizine (ANTIVERT) 25 MG tablet Take 1 tablet (25 mg total) by mouth 3 (three) times daily as needed (Take 1/2 tablet, if ineffective 25 mg).  30 tablet  6  . [DISCONTINUED] METOPROLOL SUCCINATE ER PO Take by mouth.       No current facility-administered medications for this visit.    Discontinued Meds:    Medications Discontinued During This Encounter  Medication Reason  . dextromethorphan-guaiFENesin (MUCINEX DM) 30-600 MG per 12 hr tablet Error    Patient Active Problem List  Diagnosis  . Hyperlipidemia  . Tobacco abuse  . Orthostatic hypotension  . GERD (gastroesophageal reflux disease)  . Osteopenia  . Arteriosclerotic cardiovascular disease (ASCVD)  . Cerebrovascular disease  . Syncope  . Laboratory test  . Anemia, normocytic normochromic    LABS    Component Value Date/Time   NA 138 06/15/2010 0845   NA 140 05/10/2010 1213   NA 142 05/15/2009  2130   K 4.7 06/15/2010 0845   K 5.5* 05/10/2010 1213   K 4.9 05/15/2009 2130   CL 103 06/15/2010 0845   CL 103 05/10/2010 1213   CL 106 05/15/2009 2130   CO2 27 06/15/2010 0845   CO2 27 05/10/2010 1213   CO2 25 05/15/2009 2130   GLUCOSE 78 06/15/2010 0845   GLUCOSE 86 05/10/2010 1213   GLUCOSE 77 05/15/2009 2130   BUN 20 06/15/2010 0845   BUN 14 05/10/2010 1213   BUN 11 05/15/2009 2130   CREATININE 0.82 06/15/2010 0845   CREATININE 0.85 05/10/2010 1213   CREATININE 0.74 05/15/2009 2130   CREATININE 0.54 08/30/2008 0355   CREATININE 0.68 08/29/2008 1040   CALCIUM 9.9 06/15/2010 0845   CALCIUM 10.3 05/10/2010 1213   CALCIUM 9.6 05/15/2009 2130   GFRNONAA >60 08/30/2008 0355   GFRNONAA >60 08/29/2008 1040   GFRAA  Value: >60        The eGFR has been calculated using the MDRD equation. This calculation has not been validated in all clinical situations. eGFR's persistently <60 mL/min signify possible Chronic Kidney Disease. 08/30/2008 0355   GFRAA  Value: >60        The eGFR has been  calculated using the MDRD equation. This calculation has not been validated in all clinical situations. eGFR's persistently <60 mL/min signify possible Chronic Kidney Disease. 08/29/2008 1040   CMP     Component Value Date/Time   NA 138 06/15/2010 0845   K 4.7 06/15/2010 0845   CL 103 06/15/2010 0845   CO2 27 06/15/2010 0845   GLUCOSE 78 06/15/2010 0845   BUN 20 06/15/2010 0845   CREATININE 0.82 06/15/2010 0845   CREATININE 0.74 05/15/2009 2130   CALCIUM 9.9 06/15/2010 0845   PROT 7.0 05/10/2010 1213   ALBUMIN 4.9 05/10/2010 1213   AST 31 05/10/2010 1213   ALT 32 05/10/2010 1213   ALKPHOS 55 05/10/2010 1213   BILITOT 0.5 05/10/2010 1213   GFRNONAA >60 08/30/2008 0355   GFRAA  Value: >60        The eGFR has been calculated using the MDRD equation. This calculation has not been validated in all clinical situations. eGFR's persistently <60 mL/min signify possible Chronic Kidney Disease. 08/30/2008 0355       Component Value Date/Time   WBC 7.3 05/08/2011 0925   WBC 6.7 05/10/2010 1213   WBC 9.3 08/30/2008 0355   HGB 13.5 11/09/2011 0826   HGB 11.4* 05/08/2011 0925   HGB 14.8 05/10/2010 1213   HCT 39.9 11/09/2011 0826   HCT 39.1 05/08/2011 0925   HCT 45.8 05/10/2010 1213   MCV 85.6 05/08/2011 0925   MCV 95.6 05/10/2010 1213   MCV 96.1 08/30/2008 0355    Lipid Panel     Component Value Date/Time   CHOL 160 05/08/2011 0925   TRIG 91 05/08/2011 0925   HDL 67 05/08/2011 0925   CHOLHDL 2.4 05/08/2011 0925   VLDL 18 05/08/2011 0925   LDLCALC 75 05/08/2011 0925    ABG    Component Value Date/Time   PHART 7.426* 08/31/2008 1340   PCO2ART 36.3 08/31/2008 1340   PO2ART 89.6 08/31/2008 1340   HCO3 23.4 08/31/2008 1340   TCO2 20.2 08/31/2008 1340   ACIDBASEDEF 0.4 08/31/2008 1340   O2SAT 96.7 08/31/2008 1340     Lab Results  Component Value Date   TSH 3.925 ***Test methodology is 3rd generation TSH**** 08/30/2008   BNP (last 3 results) No results found for this  basename: PROBNP,  in the last 8760 hours Cardiac Panel (last 3 results) No  results found for this basename: CKTOTAL, CKMB, TROPONINI, RELINDX,  in the last 72 hours  Iron/TIBC/Ferritin No results found for this basename: iron, tibc, ferritin     EKG Orders placed in visit on 03/06/12  . EKG 12-LEAD     Prior Assessment and Plan Problem List as of 03/06/2012     ICD-9-CM     Cardiology Problems   Hyperlipidemia   Last Assessment & Plan   11/25/2011 Office Visit Written 11/25/2011  4:10 PM by Kathlen Brunswick, MD     Recent lipid profile was excellent; current therapy will be continued.    Orthostatic hypotension   Last Assessment & Plan   02/05/2012 Office Visit Written 02/05/2012  7:40 PM by Kathlen Brunswick, MD     Orthostatic hypotension appears to have resolved and been replaced by hypertension.  ProAmatine be discontinued and orthostatic vital signs followed.  Patient's description of current dizziness sounds more like vertigo.  A trial of meclizine will be initiated.    Arteriosclerotic cardiovascular disease (ASCVD)   Last Assessment & Plan   02/05/2012 Office Visit Written 02/05/2012  7:37 PM by Kathlen Brunswick, MD     Patient continues to have chest discomfort, but based upon multiple stress tests, has apparently had a good result from CABG surgery 6-1/2 years ago.  We will continue to treat symptomatically and consider alternative potential etiologies such as gastroesophageal reflux disease.    Cerebrovascular disease   Last Assessment & Plan   02/05/2012 Office Visit Written 02/05/2012  7:37 PM by Kathlen Brunswick, MD     Cerebrovascular disease remains very mild and stable.  A repeat carotid ultrasound in 2 or 3 years would be reasonable.    Syncope     Other   Tobacco abuse   GERD (gastroesophageal reflux disease)   Last Assessment & Plan   02/05/2012 Office Visit Written 02/05/2012  7:39 PM by Kathlen Brunswick, MD     Patient denies a history of acid reflux or hiatal hernia, but if chest discomfort persists, a trial of PPI could be  considered.    Osteopenia   Laboratory test   Anemia, normocytic normochromic   Last Assessment & Plan   02/05/2012 Office Visit Written 02/05/2012  7:35 PM by Kathlen Brunswick, MD     Anemia has resolved as of the most recent CBC performed 11/2011.  H&H were also normal in 2012        Imaging: No results found.

## 2012-03-07 NOTE — Assessment & Plan Note (Signed)
No symptoms at present to suggest myocardial ischemia. We will continue to work towards optimal treatment of cardiovascular risk factors.

## 2012-03-07 NOTE — Assessment & Plan Note (Addendum)
Patient continues to have dizziness, but no syncope. Symptoms have improved spontaneously. She did not fill the prescription previously given to her for meclizine and is encouraged to do so.

## 2012-03-07 NOTE — Assessment & Plan Note (Signed)
Excellent lipid profile 10 months ago. We will continue current treatment and plan reassessment within the next few months.

## 2012-03-07 NOTE — Assessment & Plan Note (Signed)
Orthostasis has resolved despite discontinuation of midodrine.

## 2012-04-09 ENCOUNTER — Other Ambulatory Visit: Payer: Self-pay | Admitting: Cardiology

## 2012-06-04 ENCOUNTER — Other Ambulatory Visit: Payer: Self-pay | Admitting: *Deleted

## 2012-06-04 DIAGNOSIS — M5136 Other intervertebral disc degeneration, lumbar region: Secondary | ICD-10-CM

## 2012-06-04 MED ORDER — GABAPENTIN 100 MG PO CAPS: 100.0000 mg | ORAL_CAPSULE | Freq: Three times a day (TID) | ORAL | Status: DC

## 2012-08-05 ENCOUNTER — Ambulatory Visit: Payer: Medicare Other | Admitting: Cardiology

## 2012-08-06 ENCOUNTER — Ambulatory Visit: Payer: Medicare Other | Admitting: Cardiology

## 2012-08-07 ENCOUNTER — Ambulatory Visit: Payer: Medicare Other | Admitting: Cardiology

## 2012-08-14 ENCOUNTER — Emergency Department (HOSPITAL_COMMUNITY): Payer: Medicare Other

## 2012-08-14 ENCOUNTER — Encounter (HOSPITAL_COMMUNITY): Payer: Self-pay | Admitting: *Deleted

## 2012-08-14 ENCOUNTER — Other Ambulatory Visit: Payer: Self-pay | Admitting: *Deleted

## 2012-08-14 ENCOUNTER — Observation Stay (HOSPITAL_COMMUNITY)
Admission: EM | Admit: 2012-08-14 | Discharge: 2012-08-15 | Disposition: A | Discharge status: Home / Self Care | Payer: Medicare Other | Attending: Cardiology | Admitting: Cardiology

## 2012-08-14 DIAGNOSIS — R0789 Other chest pain: Principal | ICD-10-CM | POA: Insufficient documentation

## 2012-08-14 DIAGNOSIS — R079 Chest pain, unspecified: Secondary | ICD-10-CM

## 2012-08-14 DIAGNOSIS — I10 Essential (primary) hypertension: Secondary | ICD-10-CM | POA: Insufficient documentation

## 2012-08-14 DIAGNOSIS — I2510 Atherosclerotic heart disease of native coronary artery without angina pectoris: Secondary | ICD-10-CM

## 2012-08-14 HISTORY — DX: Mental disorder, not otherwise specified: F99

## 2012-08-14 HISTORY — DX: Atherosclerotic heart disease of native coronary artery without angina pectoris: I25.10

## 2012-08-14 HISTORY — DX: Cerebral infarction, unspecified: I63.9

## 2012-08-14 LAB — CBC
HCT: 39 % (ref 36.0–46.0)
Hemoglobin: 12.9 g/dL (ref 12.0–15.0)
MCV: 95.8 fL (ref 78.0–100.0)
WBC: 7 10*3/uL (ref 4.0–10.5)

## 2012-08-14 LAB — COMPREHENSIVE METABOLIC PANEL
Albumin: 3.5 g/dL (ref 3.5–5.2)
Alkaline Phosphatase: 66 U/L (ref 39–117)
BUN: 17 mg/dL (ref 6–23)
CO2: 28 mEq/L (ref 19–32)
Chloride: 106 mEq/L (ref 96–112)
GFR calc Af Amer: 66 mL/min — ABNORMAL LOW (ref >90)
GFR calc non Af Amer: 57 mL/min — ABNORMAL LOW (ref >90)
Glucose, Bld: 89 mg/dL (ref 70–99)
Potassium: 3.9 mEq/L (ref 3.5–5.1)
Total Bilirubin: 0.2 mg/dL — ABNORMAL LOW (ref 0.3–1.2)

## 2012-08-14 LAB — URINALYSIS, ROUTINE W REFLEX MICROSCOPIC
Bilirubin Urine: NEGATIVE
Glucose, UA: NEGATIVE mg/dL
Hgb urine dipstick: NEGATIVE
Protein, ur: NEGATIVE mg/dL

## 2012-08-14 LAB — PROTIME-INR: Prothrombin Time: 12.5 seconds (ref 11.6–15.2)

## 2012-08-14 LAB — POCT I-STAT TROPONIN I: Troponin i, poc: 0 ng/mL (ref 0.00–0.08)

## 2012-08-14 MED ORDER — FENTANYL CITRATE 0.05 MG/ML IJ SOLN
50.0000 ug | Freq: Once | INTRAMUSCULAR | Status: AC
  Administered 2012-08-14: 50 ug via INTRAVENOUS
  Filled 2012-08-14: qty 2

## 2012-08-14 MED ORDER — NITROGLYCERIN 0.4 MG SL SUBL
0.4000 mg | SUBLINGUAL_TABLET | SUBLINGUAL | Status: AC | PRN
  Administered 2012-08-14 (×3): 0.4 mg via SUBLINGUAL
  Filled 2012-08-14: qty 25

## 2012-08-14 MED ORDER — NITROGLYCERIN 0.4 MG SL SUBL: 0.4000 mg | SUBLINGUAL_TABLET | SUBLINGUAL | Status: DC | PRN

## 2012-08-14 MED ORDER — NITROGLYCERIN IN D5W 200-5 MCG/ML-% IV SOLN
5.0000 ug/min | INTRAVENOUS | Status: DC
  Administered 2012-08-14: 5 ug/min via INTRAVENOUS
  Filled 2012-08-14 (×2): qty 250

## 2012-08-14 MED ORDER — HEPARIN (PORCINE) IN NACL 100-0.45 UNIT/ML-% IJ SOLN
750.0000 [IU]/h | INTRAMUSCULAR | Status: DC
  Administered 2012-08-14: 600 [IU]/h via INTRAVENOUS
  Administered 2012-08-15: 750 [IU]/h via INTRAVENOUS
  Filled 2012-08-14: qty 250

## 2012-08-14 MED ORDER — ASPIRIN 81 MG PO CHEW
162.0000 mg | CHEWABLE_TABLET | Freq: Once | ORAL | Status: AC
  Administered 2012-08-14: 162 mg via ORAL
  Filled 2012-08-14: qty 2

## 2012-08-14 MED ORDER — SODIUM CHLORIDE 0.9 % IV SOLN
20.0000 mL | INTRAVENOUS | Status: DC
  Administered 2012-08-14: 1000 mL via INTRAVENOUS

## 2012-08-14 MED ORDER — HEPARIN BOLUS VIA INFUSION
3000.0000 [IU] | Freq: Once | INTRAVENOUS | Status: AC
  Administered 2012-08-14: 3000 [IU] via INTRAVENOUS

## 2012-08-14 MED ORDER — GI COCKTAIL ~~LOC~~
30.0000 mL | Freq: Once | ORAL | Status: AC
  Administered 2012-08-14: 30 mL via ORAL
  Filled 2012-08-14: qty 30

## 2012-08-14 NOTE — Progress Notes (Signed)
ANTICOAGULATION CONSULT NOTE - Initial Consult  Pharmacy Consult for Heparin Indication: chest pain/ACS  Allergies  Allergen Reactions  . Cephalexin Hives  . Contrast Media (Iodinated Diagnostic Agents)     Unknown  . Iohexol     Urticaria, emesis, numbness during IVP performed at [facility] years ago  . Penicillins Hives  . Shellfish Allergy Other (See Comments)    Unknown  . Sulfa Antibiotics Hives    Patient Measurements: Height: 5' 4.5" (163.8 cm) Weight: 113 lb (51.256 kg) IBW/kg (Calculated) : 55.85 Heparin Dosing Weight: 51.3 kg  Vital Signs: Temp: 97.6 F (36.4 C) (08/08 1616) Temp src: Oral (08/08 1616) BP: 113/87 mmHg (08/08 1850) Pulse Rate: 48 (08/08 1850)  Labs:  Recent Labs  08/14/12 1650  HGB 12.9  HCT 39.0  PLT 177  APTT 32  LABPROT 12.5  INR 0.95  CREATININE 0.90    Estimated Creatinine Clearance: 37.7 ml/min (by C-G formula based on Cr of 0.9).   Medical History: Past Medical History  Diagnosis Date  . Arteriosclerotic cardiovascular disease (ASCVD)     CABG surgery in 06/2005 for left main disease; normal EF; negative stress nuclear 08/2008  . Cerebrovascular disease     1993-CVA; 08/2008-mild atherosclerosis without focal stenosis  . Syncope      Negative even recorder dated 3/08  . Tobacco abuse     60 pack years; continuing  . Orthostatic hypotension   . Diverticulosis   . Osteopenia   . GERD (gastroesophageal reflux disease)   . Asthma     as a child  . Hypertension     Medications:  Scheduled:    Assessment: Labs reviewed PTA medications reviewed  Goal of Therapy:  Heparin level 0.3-0.7 units/ml Monitor platelets by anticoagulation protocol: Yes    Plan:  Give 3000 units bolus x 1 Start heparin infusion at 600 units/hr Check anti-Xa level in 6 to 8 hours and daily while on heparin Continue to monitor H&H and platelets   Raquel James, Darreld Hoffer Bennett 08/14/2012,8:25 PM

## 2012-08-14 NOTE — ED Notes (Signed)
No pain if she does not move, only has chest tightness if she moves her head up and down, turns side to side, etc.    OF NOTE:  Patient picked butter beans this week, and also carried the bucket they were collected in.  Describes more of a chest soreness than cardiac "tightness"    Also has discomfort in ribs under arms when she moves.

## 2012-08-14 NOTE — ED Provider Notes (Signed)
CSN: [id number]     Arrival date & time 08/14/12  1611 History  This chart was scribed for Hilario Quarry, MD by Bennett Scrape, ED Scribe. This patient was seen in room APA09/APA09 and the patient's care was started at 4:39 PM.   Chief Complaint  Patient presents with  . Chest Pain    Patient is a 79 y.o. female presenting with chest pain. The history is provided by the patient. No language interpreter was used.  Chest Pain Pain location:  Substernal area and L chest Pain quality: pressure and sharp   Pain radiates to:  L shoulder and mid back Pain radiates to the back: yes   Duration:  1 hour Timing:  Constant Context: at rest   Associated symptoms: no diaphoresis, no nausea, no shortness of breath and not vomiting     HPI Comments: [name] is a 79 y.o. female who presents to the Emergency Department complaining of CP that started while at rest one hour. She reports that the CP started substernally but is now located more under the left breast. She describes the pain as a constant pressure that radiates into the left shoulder and mid back with occasional sharp pains. She reports taking 2 NTG with mild improvement at home, but she denies feeling a burning sensation. She rates her pain as severe at it's worst with the onset and moderate currently. She lists SOB as an associated symptom. She has a h/o prior MI and CABG but does not remember her last stress test. She denies any prior h/o DVT or PE and denies being on any anticoagulants currently.  She is currently treated for HTN but denies having having a h/o DM or HLD. She reports taking one 81 mg ASA today.  Cardiologist is Dr. Dietrich Pates   Past Medical History  Diagnosis Date  . Arteriosclerotic cardiovascular disease (ASCVD)     CABG surgery in 06/2005 for left main disease; normal EF; negative stress nuclear 08/2008  . Cerebrovascular disease     1993-CVA; 08/2008-mild atherosclerosis without focal stenosis  . Syncope       Negative even recorder dated 3/08  . Tobacco abuse     60 pack years; continuing  . Orthostatic hypotension   . Diverticulosis   . Osteopenia   . GERD (gastroesophageal reflux disease)   . Asthma     as a child  . Hypertension    Past Surgical History  Procedure Laterality Date  . Abdominal hysterectomy  1990  . Coronary artery bypass graft  2007    Left main disease-2007  . Ventral hernia repair    . Appendectomy    . Tonsillectomy    . Colonoscopy  10/10/2011    Procedure: COLONOSCOPY;  Surgeon: Malissa Hippo, MD;  Location: [facility];  Service: Endoscopy;  Laterality: N/A;  1230  . Esophagogastroduodenoscopy  10/10/2011    Procedure: ESOPHAGOGASTRODUODENOSCOPY (EGD);  Surgeon: Malissa Hippo, MD;  Location: [facility];  Service: Endoscopy;  Laterality: N/A;  . Cardiac surgery     Family History  Problem Relation Age of Onset  . Heart disease    . Arthritis    . Lung disease    . Asthma     History  Substance Use Topics  . Smoking status: Former Smoker -- 1.00 packs/day for 60 years    Types: Cigarettes  . Smokeless tobacco: Never Used  . Alcohol Use: No   No OB history provided.  Review of Systems  Constitutional: Negative for diaphoresis.  Respiratory: Negative for shortness of breath.   Cardiovascular: Positive for chest pain.  Gastrointestinal: Negative for nausea and vomiting.  All other systems reviewed and are negative.    Allergies  Cephalexin; Contrast media; Iohexol; Penicillins; Shellfish allergy; and Sulfa antibiotics  Home Medications   Current Outpatient Rx  Name  Route  Sig  Dispense  Refill  . acetaminophen (TYLENOL) 325 MG tablet   Oral   Take 650 mg by mouth every 6 (six) hours as needed. For pain         . alendronate (FOSAMAX) 70 MG tablet   Oral   Take 70 mg by mouth every 7 (seven) days. Take with a full glass of water on an empty stomach.         Marland Kitchen aspirin 81 MG tablet   Oral   Take 81 mg by mouth daily.            Marland Kitchen atorvastatin (LIPITOR) 10 MG tablet      TAKE ONE (1) TABLET EACH DAY   30 tablet   6   . Calcium-Vitamin D (CALTRATE 600 PLUS-VIT D PO)   Oral   Take 2 tablets by mouth daily.          Marland Kitchen donepezil (ARICEPT) 10 MG tablet   Oral   Take 10 mg by mouth at bedtime.          . ferrous sulfate 325 (65 FE) MG tablet   Oral   Take 325 mg by mouth daily with breakfast.         . gabapentin (NEURONTIN) 100 MG capsule   Oral   Take 1 capsule (100 mg total) by mouth 3 (three) times daily.   90 capsule   5   . meclizine (ANTIVERT) 12.5 MG tablet   Oral   Take 1 tablet (12.5 mg total) by mouth 3 (three) times daily as needed (May take 2 tablets if 1 tablet is ineffective).   30 tablet   3   . nitroGLYCERIN (NITROSTAT) 0.4 MG SL tablet   Sublingual   Place 1 tablet (0.4 mg total) under the tongue every 5 (five) minutes as needed for chest pain.   25 tablet   6   . Omega-3 Fatty Acids (FISH OIL) 1200 MG CAPS   Oral   Take 1,200 mg by mouth daily.         . vitamin B-12 (CYANOCOBALAMIN) 1000 MCG tablet   Oral   Take 1,000 mcg by mouth daily.         . vitamin C (ASCORBIC ACID) 500 MG tablet   Oral   Take 500 mg by mouth daily.          Triage Vitals: BP 166/66  Pulse 51  Temp(Src) 97.6 F (36.4 C) (Oral)  Resp 20  Ht 5' 4.5" (1.638 m)  Wt 113 lb (51.256 kg)  BMI 19.1 kg/m2  SpO2 97%  Physical Exam  Nursing note and vitals reviewed. Constitutional: She is oriented to person, place, and time. She appears well-developed and well-nourished. No distress.  HENT:  Head: Normocephalic and atraumatic.  Right Ear: External ear normal.  Left Ear: External ear normal.  Nose: Nose normal.  Mouth/Throat: Oropharynx is clear and moist.  Eyes: Conjunctivae and EOM are normal. Pupils are equal, round, and reactive to light.  Neck: Normal range of motion. No JVD present. No tracheal deviation present.  Cardiovascular: Regular rhythm, normal heart sounds and  intact  distal pulses.  Bradycardia present.  Exam reveals no gallop.   No murmur heard. Strong, equal carotid strong. HR is 48  Pulmonary/Chest: Effort normal and breath sounds normal. No stridor. No respiratory distress. She exhibits no tenderness.  Abdominal: Soft. There is tenderness (mild suprapubic discomfort to palpation).  Musculoskeletal: Normal range of motion. She exhibits no edema.  Neurological: She is alert and oriented to person, place, and time. No cranial nerve deficit.  Skin: Skin is warm and dry.  Psychiatric: She has a normal mood and affect. Her behavior is normal. Judgment and thought content normal.    ED Course   Procedures (including critical care time)  Medications  0.9 %  sodium chloride infusion (1,000 mLs Intravenous New Bag/Given 08/14/12 1718)  nitroGLYCERIN (NITROSTAT) SL tablet 0.4 mg (0.4 mg Sublingual Given 08/14/12 1717)  aspirin chewable tablet 162 mg (162 mg Oral Given 08/14/12 1716)   DIAGNOSTIC STUDIES: Oxygen Saturation is 97% on room air, normal by my interpretation.    COORDINATION OF CARE: 4:46 PM-Discussed treatment plan which includes CXR, CBC panel, CMP and troponin with pt at bedside and pt agreed to plan.   Per review of pt's records at 5:00 PM 08/31/2008-Borderline positive stress test, normal left ventricular size and normal left ventricular systolic function.  Completely normal myocardial perfusion suggests that the electrocardiographic response is a false positive   11/12/2011-negative nuclear myocardial study. Bradycardia of 48 at time of study. Bradycardia des not appear to be new today.  5:20 PM-Pt rechecked and feels improved. She rates her pain as mild. Nurse is administered NTG currently.   Labs Reviewed - No data to display No results found. No diagnosis found.  Date: 08/14/2012  Rate: 47  Rhythm: sinus bradycardia  QRS Axis: normal  Intervals: normal  ST/T Wave abnormalities: nonspecific ST/T changes  Conduction  Disutrbances:prwp  Narrative Interpretation:   Old EKG Reviewed: changes noted t wave inversion in v2 is new from 23 aug 10   Results for orders placed during the hospital encounter of 08/14/12  CBC      Result Value Range   WBC 7.0  4.0 - 10.5 K/uL   RBC 4.07  3.87 - 5.11 MIL/uL   Hemoglobin 12.9  12.0 - 15.0 g/dL   HCT 62.1  30.8 - 65.7 %   MCV 95.8  78.0 - 100.0 fL   MCH 31.7  26.0 - 34.0 pg   MCHC 33.1  30.0 - 36.0 g/dL   RDW 84.6  96.2 - 95.2 %   Platelets 177  150 - 400 K/uL  COMPREHENSIVE METABOLIC PANEL      Result Value Range   Sodium 142  135 - 145 mEq/L   Potassium 3.9  3.5 - 5.1 mEq/L   Chloride 106  96 - 112 mEq/L   CO2 28  19 - 32 mEq/L   Glucose, Bld 89  70 - 99 mg/dL   BUN 17  6 - 23 mg/dL   Creatinine, Ser 8.41  0.50 - 1.10 mg/dL   Calcium 9.2  8.4 - 32.4 mg/dL   Total Protein 6.0  6.0 - 8.3 g/dL   Albumin 3.5  3.5 - 5.2 g/dL   AST 19  0 - 37 U/L   ALT 12  0 - 35 U/L   Alkaline Phosphatase 66  39 - 117 U/L   Total Bilirubin 0.2 (*) 0.3 - 1.2 mg/dL   GFR calc non Af Amer 57 (*) >90 mL/min   GFR calc Af Denyse Dago  66 (*) >90 mL/min  PROTIME-INR      Result Value Range   Prothrombin Time 12.5  11.6 - 15.2 seconds   INR 0.95  0.00 - 1.49  APTT      Result Value Range   aPTT 32  24 - 37 seconds  URINALYSIS, ROUTINE W REFLEX MICROSCOPIC      Result Value Range   Color, Urine YELLOW  YELLOW   APPearance CLEAR  CLEAR   Specific Gravity, Urine >1.030 (*) 1.005 - 1.030   pH 6.0  5.0 - 8.0   Glucose, UA NEGATIVE  NEGATIVE mg/dL   Hgb urine dipstick NEGATIVE  NEGATIVE   Bilirubin Urine NEGATIVE  NEGATIVE   Ketones, ur NEGATIVE  NEGATIVE mg/dL   Protein, ur NEGATIVE  NEGATIVE mg/dL   Urobilinogen, UA 0.2  0.0 - 1.0 mg/dL   Nitrite NEGATIVE  NEGATIVE   Leukocytes, UA NEGATIVE  NEGATIVE  POCT I-STAT TROPONIN I      Result Value Range   Troponin i, poc 0.00  0.00 - 0.08 ng/mL   Comment 3            Dg Chest Portable 1 View  08/14/2012   *RADIOLOGY REPORT*   Clinical Data: Chest pain.  History of hypertension, asthma and smoking.  PORTABLE CHEST - 1 VIEW  Comparison: 08/29/2008.  Findings: 1704 hours.  The heart size and mediastinal contours are stable status post CABG.  There is vascular congestion without overt pulmonary edema.  There is no airspace disease or pleural effusion.  Telemetry leads overlie the chest.  No acute osseous findings are seen.  IMPRESSION: Stable borderline heart size and vascular congestion status post CABG.  No acute findings identified.   Original Report Authenticated By: Carey Bullocks, M.D.    MDM   79 y.o. Female patient of Dr. Marvel Plan who presents today with sscp x one hour.  Patient with history of cad s/p cabg 2007 for left main disease.  She is given slntg and aspirin here. Patient continues to have moderate pain improved from severe on arrival.  Patient has had iv nitro and heparin ordered and cardiology is consulted.  Dr. Adolm Joseph has accepted patient to step down bed.  Patient continues to state she has pain but it is "better."  Gi cocktail added.   I personally performed the services described in this documentation, which was scribed in my presence. The recorded information has been reviewed and considered.   Hilario Quarry, MD 08/14/12 2138

## 2012-08-14 NOTE — ED Notes (Signed)
The tightness she had earlier is gone, now she only has discomfort if she moves.

## 2012-08-14 NOTE — ED Notes (Addendum)
Chest pain for 40 min. With sob, alert, skin warm and dry.  Took 2 ntg pta. Came in thru cardiac rehab and brought to ER by their staff.

## 2012-08-15 ENCOUNTER — Encounter (HOSPITAL_COMMUNITY): Payer: Self-pay | Admitting: Anesthesiology

## 2012-08-15 DIAGNOSIS — R079 Chest pain, unspecified: Secondary | ICD-10-CM

## 2012-08-15 DIAGNOSIS — I2510 Atherosclerotic heart disease of native coronary artery without angina pectoris: Secondary | ICD-10-CM

## 2012-08-15 DIAGNOSIS — I10 Essential (primary) hypertension: Secondary | ICD-10-CM

## 2012-08-15 LAB — TROPONIN I: Troponin I: <0.3 ng/mL (ref <0.30)

## 2012-08-15 LAB — CBC
MCH: 31.5 pg (ref 26.0–34.0)
MCHC: 33.6 g/dL (ref 30.0–36.0)
MCV: 93.8 fL (ref 78.0–100.0)
Platelets: 175 10*3/uL (ref 150–400)
RBC: 4.03 MIL/uL (ref 3.87–5.11)
RDW: 12.9 % (ref 11.5–15.5)

## 2012-08-15 MED ORDER — VITAMIN B-12 1000 MCG PO TABS
1000.0000 ug | ORAL_TABLET | Freq: Every morning | ORAL | Status: DC
  Administered 2012-08-15: 1000 ug via ORAL
  Filled 2012-08-15: qty 1

## 2012-08-15 MED ORDER — VITAMIN C 500 MG PO TABS
500.0000 mg | ORAL_TABLET | Freq: Every morning | ORAL | Status: DC
  Administered 2012-08-15: 500 mg via ORAL
  Filled 2012-08-15: qty 1

## 2012-08-15 MED ORDER — ONDANSETRON HCL 4 MG/2ML IJ SOLN: 4.0000 mg | Freq: Four times a day (QID) | INTRAMUSCULAR | Status: DC | PRN

## 2012-08-15 MED ORDER — NITROGLYCERIN 0.4 MG SL SUBL: 0.4000 mg | SUBLINGUAL_TABLET | SUBLINGUAL | Status: DC | PRN

## 2012-08-15 MED ORDER — DONEPEZIL HCL 10 MG PO TABS
10.0000 mg | ORAL_TABLET | Freq: Every day | ORAL | Status: DC
  Filled 2012-08-15: qty 1

## 2012-08-15 MED ORDER — NITROGLYCERIN IN D5W 200-5 MCG/ML-% IV SOLN: 3.0000 ug/min | INTRAVENOUS | Status: DC

## 2012-08-15 MED ORDER — ACETAMINOPHEN 325 MG PO TABS: 650.0000 mg | ORAL_TABLET | ORAL | Status: DC | PRN

## 2012-08-15 MED ORDER — ATORVASTATIN CALCIUM 10 MG PO TABS
10.0000 mg | ORAL_TABLET | Freq: Every day | ORAL | Status: DC
  Filled 2012-08-15: qty 1

## 2012-08-15 MED ORDER — HEPARIN BOLUS VIA INFUSION
1000.0000 [IU] | Freq: Once | INTRAVENOUS | Status: AC
  Administered 2012-08-15: 1000 [IU] via INTRAVENOUS
  Filled 2012-08-15: qty 1000

## 2012-08-15 MED ORDER — ASPIRIN EC 81 MG PO TBEC: 81.0000 mg | DELAYED_RELEASE_TABLET | Freq: Every day | ORAL | Status: DC

## 2012-08-15 NOTE — Discharge Summary (Signed)
Discharge Summary   Patient ID: [name] MRN: [id number], DOB/AGE: 1925/03/24 79 y.o. Admit date: 08/14/2012 D/C date:     08/15/2012  Primary Cardiologist: Rothbart  Primary Discharge Diagnoses:  1. Atypical chest pain, felt musculoskeletal  Secondary Discharge Diagnoses:  Past Medical History  Diagnosis Date  . CAD (coronary artery disease)     CABG surgery in 06/2005 for left main disease; normal EF; negative stress nuclear 08/2008  . Cerebrovascular disease     1993-CVA; 08/2008-mild atherosclerosis without focal stenosis  . Syncope      Negative even recorder dated 3/08  . Tobacco abuse     60 pack years; continuing  . Orthostatic hypotension   . Diverticulosis   . Osteopenia   . GERD (gastroesophageal reflux disease)   . Asthma     as a child  . Hypertension   . Mental disorder     mild dementia  . Stroke     mild left sided weakness, peripheral vision loss  . UTI (urinary tract infection)   . Anemia      Hospital Course: [name] is an 79 y/o F with history of CAD s/p CABG 2007, CVA, orthostasis, mild dementia who presented to [facility] 08/14/2012 with chest pain and was transferred to [facility]. On day of presentation she had onset of chest pain down her sternum. She also reports that she had right sided rib pain. This pain occurred after a hard day of picking butter beans. She denies any acute shortness of breath, diaphoresis, or radiation to her neck jaw or arms. She reports with the bean picking that she felt tired and had to rest often. She took a nitro yesterday which was the first time in several weeks that she had to take one. At [facility], she continued to have chest but otherwise is fully functional discomfort that was partially relieved by 3 additional sublingual nitroglycerin. She has a received a nitro drip, heparin drip, and a GI cocktail. Pain completely resolved after receiving 50 mcg of fentanyl. EKG revealed NSR with no acute ST changes  (PRWP). CXR was without acute cardiopulmonary abnormalities. Labs were benign. Her troponins remained negative. NTG gtt was weaned. Her chest pain was felt musculoskeletal. Dr. Johney Frame has seen and examined the patient today and feels she is stable for discharge. She will keep the followup appointment that she has with Dr. Dietrich Pates at the end of August.   Discharge Vitals: Blood pressure 150/72, pulse 56, temperature 97 F (36.1 C), temperature source Oral, resp. rate 18, height 5\' 4"  (1.626 m), weight 124 lb 4.8 oz (56.382 kg), SpO2 97.00%.  Labs: Lab Results  Component Value Date   WBC 7.5 08/15/2012   HGB 12.7 08/15/2012   HCT 37.8 08/15/2012   MCV 93.8 08/15/2012   PLT 175 08/15/2012     Recent Labs Lab 08/14/12 1650  NA 142  K 3.9  CL 106  CO2 28  BUN 17  CREATININE 0.90  CALCIUM 9.2  PROT 6.0  BILITOT 0.2*  ALKPHOS 66  ALT 12  AST 19  GLUCOSE 89    Recent Labs  08/15/12 0520 08/15/12 0955  TROPONINI <0.30 <0.30   Lab Results  Component Value Date   CHOL 160 05/08/2011   HDL 67 05/08/2011   LDLCALC 75 05/08/2011   TRIG 91 05/08/2011     Diagnostic Studies/Procedures   Dg Chest Portable 1 View  08/14/2012   *RADIOLOGY REPORT*  Clinical Data: Chest pain.  History of hypertension, asthma and smoking.  PORTABLE CHEST - 1 VIEW  Comparison: 08/29/2008.  Findings: 1704 hours.  The heart size and mediastinal contours are stable status post CABG.  There is vascular congestion without overt pulmonary edema.  There is no airspace disease or pleural effusion.  Telemetry leads overlie the chest.  No acute osseous findings are seen.  IMPRESSION: Stable borderline heart size and vascular congestion status post CABG.  No acute findings identified.   Original Report Authenticated By: Carey Bullocks, M.D.    Discharge Medications     Medication List         acetaminophen 325 MG tablet  Commonly known as:  TYLENOL  Take 650 mg by mouth every 6 (six) hours as needed. For pain      alendronate 70 MG tablet  Commonly known as:  FOSAMAX  Take 70 mg by mouth every Saturday. Take with a full glass of water on an empty stomach.     aspirin 81 MG tablet  Take 81 mg by mouth daily.     atorvastatin 10 MG tablet  Commonly known as:  LIPITOR  Take 10 mg by mouth at bedtime.     CALTRATE 600 PLUS-VIT D PO  Take 2 tablets by mouth daily.     ciprofloxacin 500 MG tablet  Commonly known as:  CIPRO  Take 500 mg by mouth 2 (two) times daily. For 7 days     donepezil 10 MG tablet  Commonly known as:  ARICEPT  Take 10 mg by mouth at bedtime.     Fish Oil 1200 MG Caps  Take 1,200 mg by mouth every morning.     gabapentin 100 MG capsule  Commonly known as:  NEURONTIN  Take 1 capsule (100 mg total) by mouth 3 (three) times daily.     meclizine 12.5 MG tablet  Commonly known as:  ANTIVERT  Take 1 tablet (12.5 mg total) by mouth 3 (three) times daily as needed (May take 2 tablets if 1 tablet is ineffective).     nitroGLYCERIN 0.4 MG SL tablet  Commonly known as:  NITROSTAT  Place 1 tablet (0.4 mg total) under the tongue every 5 (five) minutes as needed for chest pain.     vitamin B-12 1000 MCG tablet  Commonly known as:  CYANOCOBALAMIN  Take 1,000 mcg by mouth every morning.     vitamin C 500 MG tablet  Commonly known as:  ASCORBIC ACID  Take 500 mg by mouth every morning.        Disposition   The patient will be discharged in stable condition to home. Discharge Orders   Future Appointments Provider Department Dept Phone   09/02/2012 10:45 AM Kathlen Brunswick, MD [facility] [place] [phone]   Future Orders Complete By Expires     Diet - low sodium heart healthy  As directed     Increase activity slowly  As directed       Follow-up Information   Follow up with Colette Ribas, MD. (As scheduled)    Contact information:   [address] [place] [address] [phone]       Follow up with Sorrento Bing, MD. (09/02/12 at 10:45am)    Contact information:   [address] [place] [address] [phone]         Duration of Discharge Encounter: Greater than 30 minutes including physician and PA time.  Signed, Ronie Spies PA-C 08/15/2012,  11:05 AM  Hillis Range, MD

## 2012-08-15 NOTE — Progress Notes (Signed)
SUBJECTIVE: The patient is doing well today.  At this time, she denies chest pain, shortness of breath, or any new concerns.  Melene Muller ON 08/16/2012] aspirin EC  81 mg Oral Daily  . atorvastatin  10 mg Oral QHS  . donepezil  10 mg Oral QHS  . vitamin B-12  1,000 mcg Oral q morning - 10a  . vitamin C  500 mg Oral q morning - 10a   . sodium chloride 1,000 mL (08/14/12 1718)  . heparin 750 Units/hr (08/15/12 0715)  . nitroGLYCERIN 10 mcg/min (08/14/12 2225)  . nitroGLYCERIN Stopped (08/15/12 0430)    OBJECTIVE: Physical Exam: Filed Vitals:   08/14/12 2210 08/14/12 2224 08/15/12 0113 08/15/12 0500  BP: 185/65 134/114 153/76 150/72  Pulse:  56 50 56  Temp:   97.8 F (36.6 C) 97 F (36.1 C)  TempSrc:   Oral Oral  Resp:  20 18 18   Height:   5\' 4"  (1.626 m)   Weight:   124 lb 4.8 oz (56.382 kg)   SpO2: 99%  97% 97%   No intake or output data in the 24 hours ending 08/15/12 0842  Telemetry reveals sinus rhythm with PVCs  GEN- The patient is well appearing, aler  Head- normocephalic, atraumatic Eyes-  Sclera clear, conjunctiva pink Ears- hearing intact Oropharynx- clear Neck- supple, no JVP Lymph- no cervical lymphadenopathy Lungs- Clear to ausculation bilaterally, normal work of breathing Heart- Regular rate and rhythm, no murmurs, rubs or gallops, PMI not laterally displaced GI- soft, NT, ND, + BS Extremities- no clubbing, cyanosis, or edema  LABS: Basic Metabolic Panel:  Recent Labs  84/69/62 1650  NA 142  K 3.9  CL 106  CO2 28  GLUCOSE 89  BUN 17  CREATININE 0.90  CALCIUM 9.2   Liver Function Tests:  Recent Labs  08/14/12 1650  AST 19  ALT 12  ALKPHOS 66  BILITOT 0.2*  PROT 6.0  ALBUMIN 3.5   No results found for this basename: LIPASE, AMYLASE,  in the last 72 hours CBC:  Recent Labs  08/14/12 1650 08/15/12 0520  WBC 7.0 7.5  HGB 12.9 12.7  HCT 39.0 37.8  MCV 95.8 93.8  PLT 177 175   Cardiac Enzymes:  Recent Labs  08/15/12 0520    TROPONINI <0.30   BNP: No components found with this basename: POCBNP,  D-Dimer: No results found for this basename: DDIMER,  in the last 72 hours Hemoglobin A1C: No results found for this basename: HGBA1C,  in the last 72 hours Fasting Lipid Panel: No results found for this basename: CHOL, HDL, LDLCALC, TRIG, CHOLHDL, LDLDIRECT,  in the last 72 hours Thyroid Function Tests: No results found for this basename: TSH, T4TOTAL, FREET3, T3FREE, THYROIDAB,  in the last 72 hours Anemia Panel: No results found for this basename: VITAMINB12, FOLATE, FERRITIN, TIBC, IRON, RETICCTPCT,  in the last 72 hours  RADIOLOGY: Dg Chest Portable 1 View  08/14/2012   *RADIOLOGY REPORT*  Clinical Data: Chest pain.  History of hypertension, asthma and smoking.  PORTABLE CHEST - 1 VIEW  Comparison: 08/29/2008.  Findings: 1704 hours.  The heart size and mediastinal contours are stable status post CABG.  There is vascular congestion without overt pulmonary edema.  There is no airspace disease or pleural effusion.  Telemetry leads overlie the chest.  No acute osseous findings are seen.  IMPRESSION: Stable borderline heart size and vascular congestion status post CABG.  No acute findings identified.   Original Report Authenticated By:  Carey Bullocks, M.D.    ASSESSMENT AND PLAN:  Active Problems:   * No active hospital problems. *  1. Atypical chest pain Resolved.  She does have tenderness with chest wall palpation. I think this is musculoskeletal pain. Will stop heparin.  If second set of CMs are negative, will discharge. No further inpatient workup is required. She will follow-up with primary care and Dr Dietrich Pates as scheduled.    Hillis Range, MD 08/15/2012 8:42 AM

## 2012-08-15 NOTE — Progress Notes (Signed)
ANTICOAGULATION CONSULT NOTE -Follow Up  Pharmacy Consult for Heparin Indication: chest pain/ACS  Allergies  Allergen Reactions  . Cephalexin Hives  . Contrast Media (Iodinated Diagnostic Agents)     Unknown  . Iohexol     Urticaria, emesis, numbness during IVP performed at [facility] years ago  . Penicillins Hives  . Shellfish Allergy Other (See Comments)    Unknown  . Sulfa Antibiotics Hives    Patient Measurements: Height: 5\' 4"  (162.6 cm) Weight: 124 lb 4.8 oz (56.382 kg) IBW/kg (Calculated) : 54.7 Heparin Dosing Weight: 51.3 kg  Vital Signs: Temp: 97 F (36.1 C) (08/09 0500) Temp src: Oral (08/09 0500) BP: 150/72 mmHg (08/09 0500) Pulse Rate: 56 (08/09 0500)  Labs:  Recent Labs  08/14/12 1650 08/15/12 0520  HGB 12.9 12.7  HCT 39.0 37.8  PLT 177 175  APTT 32  --   LABPROT 12.5  --   INR 0.95  --   HEPARINUNFRC  --  0.10*  CREATININE 0.90  --     Estimated Creatinine Clearance: 40.2 ml/min (by C-G formula based on Cr of 0.9).   Medical History: Past Medical History  Diagnosis Date  . Arteriosclerotic cardiovascular disease (ASCVD)     CABG surgery in 06/2005 for left main disease; normal EF; negative stress nuclear 08/2008  . Cerebrovascular disease     1993-CVA; 08/2008-mild atherosclerosis without focal stenosis  . Syncope      Negative even recorder dated 3/08  . Tobacco abuse     60 pack years; continuing  . Orthostatic hypotension   . Diverticulosis   . Osteopenia   . GERD (gastroesophageal reflux disease)   . Asthma     as a child  . Hypertension   . Coronary artery disease   . Mental disorder     dementia  . Stroke     mild left sided weakness, peripheral vision loss  . UTI (urinary tract infection)   . Anemia     Medications:  Scheduled:  . [START ON 08/16/2012] aspirin EC  81 mg Oral Daily  . atorvastatin  10 mg Oral QHS  . donepezil  10 mg Oral QHS  . vitamin B-12  1,000 mcg Oral q morning - 10a  . vitamin C  500 mg  Oral q morning - 10a    Assessment: 79 yo lady started on heparin for CP.  Her initial heparin level is 0.1 units/ml.  No issues noted with infusion per RN.  Her Hg is stable at 12.7, PTLC 175.  Goal of Therapy:  Heparin level 0.3-0.7 units/ml Monitor platelets by anticoagulation protocol: Yes    Plan:  Increase heparin drip to 750 units/hr after 1000 unit bolus. Check heparin level 8 hours after rate change.   Talbert Cage Poteet 08/15/2012,6:23 AM

## 2012-08-15 NOTE — H&P (Signed)
History and Physical  Patient ID: [name] MRN: [id number], SOB: 1925-11-13 79 y.o. Date of Encounter: 08/15/2012, 1:58 AM  Primary Physician: Colette Ribas, MD Primary Cardiologist: Dr. Dietrich Pates  Chief Complaint: chest pain  HPI: 79 y.o. female w/ PMHx significant for CAD s/p CABG 2007, CVA, orthostasis, mild dementia who presented to [facility] 08/14/2012 with chest pain and was transferred to [facility]. History is also provided by the patient's daughter who is present at bedside.   [name] reports that on day of presentation she had onset of chest pain that she describes down her sternum. She also reports that she had right sided rib pain. This pain occurred after a hard day of picking butter beans. She denies any acute shortness of breath, diaphoresis, or radiation to her neck jaw or arms. She reports with the bean picking that she felt tired and had to rest often. She took a nitro yesterday which was the first time in several weeks that she had to take one. Today, she took 2 nitro but admits that they did not fizz and were expired by date. At [facility], she continued to have chest but otherwise is fully functional discomfort that was partially relieved by 3 additional sublingual nitroglycerin. She has a received a nitro drip, heparin drip, and a GI cocktail. Pain completely resolved after receiving 50 mcg of fentanyl.  Daughter admits that the patient's story his vault is somewhat. Initially the symptoms did sound more cardiac in nature however now patient describes more musculoskeletal pain symptoms. Patient does have mild dementia but is relatively independent in her ADLs.  Her last stress was a pharm MPI which was negative in 10/2011.  EKG revealed NSR with no acute ST changes (PRWP). CXR was without acute cardiopulmonary abnormalities. Labs are benign.   Past Medical History  Diagnosis Date  . Arteriosclerotic cardiovascular disease (ASCVD)     CABG surgery in  06/2005 for left main disease; normal EF; negative stress nuclear 08/2008  . Cerebrovascular disease     1993-CVA; 08/2008-mild atherosclerosis without focal stenosis  . Syncope      Negative even recorder dated 3/08  . Tobacco abuse     60 pack years; continuing  . Orthostatic hypotension   . Diverticulosis   . Osteopenia   . GERD (gastroesophageal reflux disease)   . Asthma     as a child  . Hypertension   . Coronary artery disease   . Mental disorder     dementia  . Stroke     mild left sided weakness, peripheral vision loss  . UTI (urinary tract infection)   . Anemia      Surgical History:  Past Surgical History  Procedure Laterality Date  . Abdominal hysterectomy  1990  . Coronary artery bypass graft  2007    Left main disease-2007  . Ventral hernia repair    . Appendectomy    . Tonsillectomy    . Colonoscopy  10/10/2011    Procedure: COLONOSCOPY;  Surgeon: Malissa Hippo, MD;  Location: [facility];  Service: Endoscopy;  Laterality: N/A;  1230  . Esophagogastroduodenoscopy  10/10/2011    Procedure: ESOPHAGOGASTRODUODENOSCOPY (EGD);  Surgeon: Malissa Hippo, MD;  Location: [facility];  Service: Endoscopy;  Laterality: N/A;  . Cardiac surgery       Home Meds: Prior to Admission medications   Medication Sig Start Date End Date Taking? Authorizing Provider  alendronate (FOSAMAX) 70 MG tablet Take 70 mg  by mouth every Saturday. Take with a full glass of water on an empty stomach.   Yes Historical Provider, MD  aspirin 81 MG tablet Take 81 mg by mouth daily.     Yes Historical Provider, MD  atorvastatin (LIPITOR) 10 MG tablet Take 10 mg by mouth at bedtime.   Yes Historical Provider, MD  Calcium-Vitamin D (CALTRATE 600 PLUS-VIT D PO) Take 2 tablets by mouth daily.    Yes Historical Provider, MD  ciprofloxacin (CIPRO) 500 MG tablet Take 500 mg by mouth 2 (two) times daily. For 7 days 08/06/12  Yes Historical Provider, MD  donepezil (ARICEPT) 10 MG tablet Take 10 mg by  mouth at bedtime.    Yes Historical Provider, MD  meclizine (ANTIVERT) 12.5 MG tablet Take 1 tablet (12.5 mg total) by mouth 3 (three) times daily as needed (May take 2 tablets if 1 tablet is ineffective). 03/06/12  Yes Kathlen Brunswick, MD  nitroGLYCERIN (NITROSTAT) 0.4 MG SL tablet Place 1 tablet (0.4 mg total) under the tongue every 5 (five) minutes as needed for chest pain. 08/14/12  Yes Jodelle Gross, NP  Omega-3 Fatty Acids (FISH OIL) 1200 MG CAPS Take 1,200 mg by mouth every morning.    Yes Historical Provider, MD  vitamin B-12 (CYANOCOBALAMIN) 1000 MCG tablet Take 1,000 mcg by mouth every morning.    Yes Historical Provider, MD  vitamin C (ASCORBIC ACID) 500 MG tablet Take 500 mg by mouth every morning.    Yes Historical Provider, MD  acetaminophen (TYLENOL) 325 MG tablet Take 650 mg by mouth every 6 (six) hours as needed. For pain    Historical Provider, MD  gabapentin (NEURONTIN) 100 MG capsule Take 1 capsule (100 mg total) by mouth 3 (three) times daily. 06/04/12   Vickki Hearing, MD    Allergies:  Allergies  Allergen Reactions  . Cephalexin Hives  . Contrast Media (Iodinated Diagnostic Agents)     Unknown  . Iohexol     Urticaria, emesis, numbness during IVP performed at [facility] years ago  . Penicillins Hives  . Shellfish Allergy Other (See Comments)    Unknown  . Sulfa Antibiotics Hives    History   Social History  . Marital Status: Married    Spouse Name: N/A    Number of Children: N/A  . Years of Education: N/A   Occupational History  . retired    Social History Main Topics  . Smoking status: Former Smoker -- 1.00 packs/day for 60 years    Types: Cigarettes  . Smokeless tobacco: Never Used  . Alcohol Use: No  . Drug Use: No  . Sexually Active: Not on file   Other Topics Concern  . Not on file   Social History Narrative  . No narrative on file     Family History  Problem Relation Age of Onset  . Heart disease    . Arthritis    .  Lung disease    . Asthma      Review of Systems: General: negative for chills, fever, night sweats or weight changes.  Cardiovascular:  As per HPI Dermatological: negative for rash Respiratory: negative for cough or wheezing Urologic: negative for hematuria Abdominal: negative for nausea, vomiting, diarrhea, bright red blood per rectum, melena, or hematemesis Neurologic: negative for visual changes, syncope. +Dizziness. All other systems reviewed and are otherwise negative except as noted above.  Labs:   Lab Results  Component Value Date   WBC 7.0 08/14/2012  HGB 12.9 08/14/2012   HCT 39.0 08/14/2012   MCV 95.8 08/14/2012   PLT 177 08/14/2012    Recent Labs Lab 08/14/12 1650  NA 142  K 3.9  CL 106  CO2 28  BUN 17  CREATININE 0.90  CALCIUM 9.2  PROT 6.0  BILITOT 0.2*  ALKPHOS 66  ALT 12  AST 19  GLUCOSE 89   No results found for this basename: CKTOTAL, CKMB, TROPONINI,  in the last 72 hours Lab Results  Component Value Date   CHOL 160 05/08/2011   HDL 67 05/08/2011   LDLCALC 75 05/08/2011   TRIG 91 05/08/2011    Radiology/Studies:  Dg Chest Portable 1 View  08/14/2012   *RADIOLOGY REPORT*  Clinical Data: Chest pain.  History of hypertension, asthma and smoking.  PORTABLE CHEST - 1 VIEW  Comparison: 08/29/2008.  Findings: 1704 hours.  The heart size and mediastinal contours are stable status post CABG.  There is vascular congestion without overt pulmonary edema.  There is no airspace disease or pleural effusion.  Telemetry leads overlie the chest.  No acute osseous findings are seen.  IMPRESSION: Stable borderline heart size and vascular congestion status post CABG.  No acute findings identified.   Original Report Authenticated By: Carey Bullocks, M.D.     EKG: sinus brady, PRWP, unchanged from prior  Physical Exam: Blood pressure 153/76, pulse 50, temperature 97.8 F (36.6 C), temperature source Oral, resp. rate 18, height 5\' 4"  (1.626 m), weight 56.382 kg (124 lb 4.8 oz),  SpO2 97.00%. General: Well developed, well nourished, in no acute distress. Head: Normocephalic, atraumatic, sclera non-icteric, nares are without discharge Neck: Supple. Negative for carotid bruits. JVD not elevated. Lungs: Clear bilaterally to auscultation without wheezes, rales, or rhonchi. Breathing is unlabored. Heart: RRR with S1 S2. No murmurs, rubs, or gallops appreciated. Chest wall tenderness at top of sternum. Sternal wires palpable but not protruding.  Abdomen: Soft, non-tender, non-distended with normoactive bowel sounds. No rebound/guarding. No obvious abdominal masses. Msk:  Strength and tone appear normal for age. Extremities: No edema. No clubbing or cyanosis. Distal pedal pulses are 2+ and equal bilaterally. Neuro: Alert and conversant. Evidence of difficulty with recall. Moves all extremities spontaneously. Psych:  Responds to questions appropriately with a normal affect.   Problem List 1. Chest pain, atypical 2. Known CAD s/p CABG 2007 3. Mild dementia 4. Bradycardia, asymptomatic 5. H/o CVD 6. Hypertension 7. H/o of chronic dizziness  ASSESSMENT AND PLAN:   79 y.o. female w/ PMHx significant for CAD s/p CABG 2007, CVA, orthostasis, mild dementia who presented to [facility] 08/14/2012 with chest pain and was transferred to [facility].  Encouragingly, her initial biomarkers are negative. Her EKG is unchanged. She is now chest pain free. Though her story may have evolved, her pain now sounds very atypical and the palpable nature decreases the likelihood that this represents cardiac ischemia. Last noninvasive risk stratification was a regadenosen MPI that despite equivocal EKG changes, imaging was normal with EF > 65%. Unless more typical symptoms occur or she has EKG changes or + biomarkers, would favor no further evaluation and close monitoring. Alternative diagnosis of MSK pain from bean picking is high on differential. However, will continue heparin until AM when  serial troponins available. Wean nitro aggressively. Bradycardia precludes beta blocker therapy. Continue statin.  Hypertension- not currently on medications though in the elderly with difficulties with dizziness, would be reasonable to be a bit more relaxed with blood pressure management.  Continue  home dementia medication.  NPO just in case procedure is needed.  Prophylaxis: Heparin gtt  Full code.  Signed, Adolm Joseph, Jhair Witherington C. MD 08/15/2012, 1:58 AM

## 2012-08-15 NOTE — ED Notes (Signed)
Output a total of approximately 300 ml total over a period - frequency and urgency.  Perineum cleaned and disposable pads placed on patient.

## 2012-09-02 ENCOUNTER — Ambulatory Visit (INDEPENDENT_AMBULATORY_CARE_PROVIDER_SITE_OTHER): Payer: Medicare Other | Admitting: Cardiology

## 2012-09-02 ENCOUNTER — Encounter: Payer: Self-pay | Admitting: Cardiology

## 2012-09-02 VITALS — BP 135/87 | HR 67 | Ht 64.0 in | Wt 116.0 lb

## 2012-09-02 DIAGNOSIS — E785 Hyperlipidemia, unspecified: Secondary | ICD-10-CM

## 2012-09-02 DIAGNOSIS — I951 Orthostatic hypotension: Secondary | ICD-10-CM

## 2012-09-02 DIAGNOSIS — I679 Cerebrovascular disease, unspecified: Secondary | ICD-10-CM

## 2012-09-02 DIAGNOSIS — R42 Dizziness and giddiness: Secondary | ICD-10-CM

## 2012-09-02 DIAGNOSIS — I2510 Atherosclerotic heart disease of native coronary artery without angina pectoris: Secondary | ICD-10-CM

## 2012-09-02 DIAGNOSIS — I7090 Unspecified atherosclerosis: Secondary | ICD-10-CM

## 2012-09-02 NOTE — Assessment & Plan Note (Addendum)
15 mm change in systolic blood pressure at this visit without symptoms. I doubt that orthostasis accounts for her long-standing dizziness. Evaluation by an otolaryngologist is advised.  Patient has had borderline systolic hypertension in the past. She will monitor in non-healthcare settings and return for blood pressure check in one month.

## 2012-09-02 NOTE — Progress Notes (Deleted)
Name: [name]    DOB: Oct 22, 1925  Age: 79 y.o.  MR#: [id number]       PCP:  Colette Ribas, MD      Insurance: Payor: Cleatrice Burke MEDICARE / Plan: AARP MEDICARE COMPLETE / Product Type: *No Product type* /   CC:   No chief complaint on file. BOTTLES  VS Filed Vitals:   09/02/12 1044  BP: 152/78  Pulse: 52  Height: 5\' 4"  (1.626 m)  Weight: 116 lb (52.617 kg)    Weights Current Weight  09/02/12 116 lb (52.617 kg)  08/15/12 124 lb 4.8 oz (56.382 kg)  03/06/12 115 lb (52.164 kg)    Blood Pressure  BP Readings from Last 3 Encounters:  09/02/12 152/78  08/15/12 150/72  03/06/12 126/82     Admit date:  (Not on file) Last encounter with RMR:  04/09/2012   Allergy Cephalexin; Contrast media; Iohexol; Penicillins; Shellfish allergy; and Sulfa antibiotics  Current Outpatient Prescriptions  Medication Sig Dispense Refill  . acetaminophen (TYLENOL) 325 MG tablet Take 650 mg by mouth every 6 (six) hours as needed. For pain      . alendronate (FOSAMAX) 70 MG tablet Take 70 mg by mouth every Saturday. Take with a full glass of water on an empty stomach.      Marland Kitchen aspirin 81 MG tablet Take 81 mg by mouth daily.        Marland Kitchen atorvastatin (LIPITOR) 10 MG tablet Take 10 mg by mouth at bedtime.      . Calcium-Vitamin D (CALTRATE 600 PLUS-VIT D PO) Take 2 tablets by mouth daily.       Marland Kitchen donepezil (ARICEPT) 10 MG tablet Take 10 mg by mouth at bedtime.       . gabapentin (NEURONTIN) 100 MG capsule Take 1 capsule (100 mg total) by mouth 3 (three) times daily.  90 capsule  5  . meclizine (ANTIVERT) 12.5 MG tablet Take 1 tablet (12.5 mg total) by mouth 3 (three) times daily as needed (May take 2 tablets if 1 tablet is ineffective).  30 tablet  3  . nitroGLYCERIN (NITROSTAT) 0.4 MG SL tablet Place 1 tablet (0.4 mg total) under the tongue every 5 (five) minutes as needed for chest pain.  25 tablet  6  . Omega-3 Fatty Acids (FISH OIL) 1200 MG CAPS Take 1,200 mg by mouth every morning.       .  vitamin B-12 (CYANOCOBALAMIN) 1000 MCG tablet Take 1,000 mcg by mouth every morning.       . vitamin C (ASCORBIC ACID) 500 MG tablet Take 500 mg by mouth every morning.       . [DISCONTINUED] METOPROLOL SUCCINATE ER PO Take by mouth.       No current facility-administered medications for this visit.    Discontinued Meds:    Medications Discontinued During This Encounter  Medication Reason  . ciprofloxacin (CIPRO) 500 MG tablet Error    Patient Active Problem List   Diagnosis Date Noted  . Laboratory test 05/09/2011  . Arteriosclerotic cardiovascular disease (ASCVD)   . Cerebrovascular disease   . Syncope   . Hyperlipidemia 05/07/2009  . Tobacco abuse 05/07/2009  . Orthostatic hypotension 05/07/2009  . GERD (gastroesophageal reflux disease) 09/19/2008  . Osteopenia 09/19/2008    LABS    Component Value Date/Time   NA 142 08/14/2012 1650   NA 138 06/15/2010 0845   NA 140 05/10/2010 1213   K 3.9 08/14/2012 1650   K 4.7 06/15/2010 0845  K 5.5* 05/10/2010 1213   CL 106 08/14/2012 1650   CL 103 06/15/2010 0845   CL 103 05/10/2010 1213   CO2 28 08/14/2012 1650   CO2 27 06/15/2010 0845   CO2 27 05/10/2010 1213   GLUCOSE 89 08/14/2012 1650   GLUCOSE 78 06/15/2010 0845   GLUCOSE 86 05/10/2010 1213   BUN 17 08/14/2012 1650   BUN 20 06/15/2010 0845   BUN 14 05/10/2010 1213   CREATININE 0.90 08/14/2012 1650   CREATININE 0.82 06/15/2010 0845   CREATININE 0.85 05/10/2010 1213   CREATININE 0.74 05/15/2009 2130   CREATININE 0.54 08/30/2008 0355   CALCIUM 9.2 08/14/2012 1650   CALCIUM 9.9 06/15/2010 0845   CALCIUM 10.3 05/10/2010 1213   GFRNONAA 57* 08/14/2012 1650   GFRNONAA >60 08/30/2008 0355   GFRNONAA >60 08/29/2008 1040   GFRAA 66* 08/14/2012 1650   GFRAA  Value: >60        The eGFR has been calculated using the MDRD equation. This calculation has not been validated in all clinical situations. eGFR's persistently <60 mL/min signify possible Chronic Kidney Disease. 08/30/2008 0355   GFRAA  Value: >60        The eGFR has been  calculated using the MDRD equation. This calculation has not been validated in all clinical situations. eGFR's persistently <60 mL/min signify possible Chronic Kidney Disease. 08/29/2008 1040   CMP     Component Value Date/Time   NA 142 08/14/2012 1650   K 3.9 08/14/2012 1650   CL 106 08/14/2012 1650   CO2 28 08/14/2012 1650   GLUCOSE 89 08/14/2012 1650   BUN 17 08/14/2012 1650   CREATININE 0.90 08/14/2012 1650   CREATININE 0.82 06/15/2010 0845   CALCIUM 9.2 08/14/2012 1650   PROT 6.0 08/14/2012 1650   ALBUMIN 3.5 08/14/2012 1650   AST 19 08/14/2012 1650   ALT 12 08/14/2012 1650   ALKPHOS 66 08/14/2012 1650   BILITOT 0.2* 08/14/2012 1650   GFRNONAA 57* 08/14/2012 1650   GFRAA 66* 08/14/2012 1650       Component Value Date/Time   WBC 7.5 08/15/2012 0520   WBC 7.0 08/14/2012 1650   WBC 7.3 05/08/2011 0925   HGB 12.7 08/15/2012 0520   HGB 12.9 08/14/2012 1650   HGB 13.5 11/09/2011 0826   HCT 37.8 08/15/2012 0520   HCT 39.0 08/14/2012 1650   HCT 39.9 11/09/2011 0826   MCV 93.8 08/15/2012 0520   MCV 95.8 08/14/2012 1650   MCV 85.6 05/08/2011 0925    Lipid Panel     Component Value Date/Time   CHOL 160 05/08/2011 0925   TRIG 91 05/08/2011 0925   HDL 67 05/08/2011 0925   CHOLHDL 2.4 05/08/2011 0925   VLDL 18 05/08/2011 0925   LDLCALC 75 05/08/2011 0925    ABG    Component Value Date/Time   PHART 7.426* 08/31/2008 1340   PCO2ART 36.3 08/31/2008 1340   PO2ART 89.6 08/31/2008 1340   HCO3 23.4 08/31/2008 1340   TCO2 20.2 08/31/2008 1340   ACIDBASEDEF 0.4 08/31/2008 1340   O2SAT 96.7 08/31/2008 1340     Lab Results  Component Value Date   TSH 3.925 ***Test methodology is 3rd generation TSH**** 08/30/2008   BNP (last 3 results) No results found for this basename: PROBNP,  in the last 8760 hours Cardiac Panel (last 3 results) No results found for this basename: CKTOTAL, CKMB, TROPONINI, RELINDX,  in the last 72 hours  Iron/TIBC/Ferritin No results found for this basename: iron, tibc, ferritin  EKG Orders placed during the  hospital encounter of 08/14/12  . ED EKG  . ED EKG  . EKG 12-LEAD  . EKG 12-LEAD  . EKG 12-LEAD  . EKG     Prior Assessment and Plan Problem List as of 09/02/2012   Hyperlipidemia   Last Assessment & Plan   03/06/2012 Office Visit Written 03/07/2012  4:44 PM by Kathlen Brunswick, MD     Excellent lipid profile 10 months ago. We will continue current treatment and plan reassessment within the next few months.    Tobacco abuse   Orthostatic hypotension   Last Assessment & Plan   03/06/2012 Office Visit Written 03/07/2012  4:45 PM by Kathlen Brunswick, MD     Orthostasis has resolved despite discontinuation of midodrine.    GERD (gastroesophageal reflux disease)   Last Assessment & Plan   02/05/2012 Office Visit Written 02/05/2012  7:39 PM by Kathlen Brunswick, MD     Patient denies a history of acid reflux or hiatal hernia, but if chest discomfort persists, a trial of PPI could be considered.    Osteopenia   Arteriosclerotic cardiovascular disease (ASCVD)   Last Assessment & Plan   03/06/2012 Office Visit Written 03/07/2012  4:43 PM by Kathlen Brunswick, MD     No symptoms at present to suggest myocardial ischemia. We will continue to work towards optimal treatment of cardiovascular risk factors.    Cerebrovascular disease   Last Assessment & Plan   02/05/2012 Office Visit Written 02/05/2012  7:37 PM by Kathlen Brunswick, MD     Cerebrovascular disease remains very mild and stable.  A repeat carotid ultrasound in 2 or 3 years would be reasonable.    Syncope   Last Assessment & Plan   03/06/2012 Office Visit Edited 03/07/2012 10:44 PM by Kathlen Brunswick, MD     Patient continues to have dizziness, but no syncope. Symptoms have improved spontaneously. She did not fill the prescription previously given to her for meclizine and is encouraged to do so.    Laboratory test       Imaging: Dg Chest Portable 1 View  08/14/2012   *RADIOLOGY REPORT*  Clinical Data: Chest pain.  History of  hypertension, asthma and smoking.  PORTABLE CHEST - 1 VIEW  Comparison: 08/29/2008.  Findings: 1704 hours.  The heart size and mediastinal contours are stable status post CABG.  There is vascular congestion without overt pulmonary edema.  There is no airspace disease or pleural effusion.  Telemetry leads overlie the chest.  No acute osseous findings are seen.  IMPRESSION: Stable borderline heart size and vascular congestion status post CABG.  No acute findings identified.   Original Report Authenticated By: Carey Bullocks, M.D.

## 2012-09-02 NOTE — Assessment & Plan Note (Signed)
Near optimal lipid values when last assessed more than one year ago on low-dose atorvastatin. No adjustment of therapy is necessary.

## 2012-09-02 NOTE — Patient Instructions (Addendum)
Your physician recommends that you schedule a follow-up appointment in: 9 MONTHS/BLOOD PRESSURE CHECK IN ONE MONTH WITH THE NURSE  Your physician has requested that you regularly monitor and record your blood pressure readings at home. Please use the same machine at the same time of day to check your readings and record them to bring to your follow-up visit.BRING AT YOUR NEXT OFFICE VISIT  Referral placed in pt chart, pt aware that someone will call about upcoming apt date/time FOR ENT DUE TO VERTIGO

## 2012-09-02 NOTE — Assessment & Plan Note (Signed)
Minimal carotid atherosclerosis when last assessed 8 months ago. Due to patient's advanced age, the benefit of serial testing is probably limited. I certainly would not consider repeat carotid imaging for at least 3 years.

## 2012-09-02 NOTE — Assessment & Plan Note (Signed)
Patient has a history of intermittent chest discomfort with negative stress nuclear studies in 2010, 2013 and 01/2012. She has not required sublingual nitroglycerin since hospital admission. We will continue to treat symptomatically without further diagnostic testing at present.

## 2012-09-02 NOTE — Progress Notes (Signed)
Patient ID: [name], female   DOB: 1925-03-04, 79 y.o.   MRN: [id number]  HPI: Scheduled return visit for this delightful octogenarian with coronary artery disease, but a generally stable course since CABG surgery 7 years ago. She was recently admitted to hospital with atypical chest discomfort and was not thought to require functional or anatomic imaging. Symptoms have subsequently resolved. She reports generally feeling fine although she is intermittently troubled by dizziness, more likely vertigo than cerebral hypoperfusion based upon her description. The symptoms are chronic and have previously been evaluated by a neurologist but not by an otolaryngologist. She has been treated with meclizine without definite benefit.  Current Outpatient Prescriptions  Medication Sig Dispense Refill  . acetaminophen (TYLENOL) 325 MG tablet Take 650 mg by mouth every 6 (six) hours as needed. For pain      . alendronate (FOSAMAX) 70 MG tablet Take 70 mg by mouth every Saturday. Take with a full glass of water on an empty stomach.      Marland Kitchen aspirin 81 MG tablet Take 81 mg by mouth daily.        Marland Kitchen atorvastatin (LIPITOR) 10 MG tablet Take 10 mg by mouth at bedtime.      . Calcium-Vitamin D (CALTRATE 600 PLUS-VIT D PO) Take 2 tablets by mouth daily.       Marland Kitchen donepezil (ARICEPT) 10 MG tablet Take 10 mg by mouth at bedtime.       . gabapentin (NEURONTIN) 100 MG capsule Take 1 capsule (100 mg total) by mouth 3 (three) times daily.  90 capsule  5  . meclizine (ANTIVERT) 12.5 MG tablet Take 1 tablet (12.5 mg total) by mouth 3 (three) times daily as needed (May take 2 tablets if 1 tablet is ineffective).  30 tablet  3  . nitroGLYCERIN (NITROSTAT) 0.4 MG SL tablet Place 1 tablet (0.4 mg total) under the tongue every 5 (five) minutes as needed for chest pain.  25 tablet  6  . Omega-3 Fatty Acids (FISH OIL) 1200 MG CAPS Take 1,200 mg by mouth every morning.       . vitamin B-12 (CYANOCOBALAMIN) 1000 MCG tablet Take 1,000 mcg  by mouth every morning.       . vitamin C (ASCORBIC ACID) 500 MG tablet Take 500 mg by mouth every morning.       . [DISCONTINUED] METOPROLOL SUCCINATE ER PO Take by mouth.       No current facility-administered medications for this visit.   Allergies  Allergen Reactions  . Cephalexin Hives  . Contrast Media [Iodinated Diagnostic Agents]     Unknown  . Iohexol     Urticaria, emesis, numbness during IVP performed at [facility] years ago  . Penicillins Hives  . Shellfish Allergy Other (See Comments)    Unknown  . Sulfa Antibiotics Hives     Past medical history, social history, and family history reviewed and updated.  ROS: Denies orthostatic symptoms. She's had no dyspnea nor chest discomfort. Intermittent unsteadiness of gait, tinnitus and lightheadedness unrelated to body position or movement of the head. She is chronically decreased hearing in her left ear. She has chronic back pain attributed to DJD. All other systems reviewed and are negative.  PHYSICAL EXAM: BP 135/87  Pulse 67  Ht 5\' 4"  (1.626 m)  Wt 52.617 kg (116 lb)  BMI 19.9 kg/m2;  Body mass index is 19.9 kg/(m^2).  Borderline significant orthostatic change in blood pressure without symptoms. General-Well developed; no acute distress  Body habitus-thin Neck-No JVD; bilateral carotid bruits, more prominent on the left Lungs-clear to auscultation; resonant to percussion Cardiovascular-normal PMI; normal S1 and S2; grade 2/6 basilar systolic ejection murmur Abdomen-normal bowel sounds; soft and non-tender without masses or organomegaly Musculoskeletal-No deformities, no cyanosis or clubbing Neurologic-Normal cranial nerves; symmetric strength and tone Skin-Warm, no significant lesions Extremities-distal pulses intact; no edema  Pekin Bing, MD 09/02/2012  12:02 PM  ASSESSMENT AND PLAN

## 2012-10-05 ENCOUNTER — Ambulatory Visit (INDEPENDENT_AMBULATORY_CARE_PROVIDER_SITE_OTHER): Payer: Medicare Other | Admitting: *Deleted

## 2012-10-05 VITALS — BP 155/81 | HR 50 | Ht 64.0 in | Wt 118.8 lb

## 2012-10-05 DIAGNOSIS — I10 Essential (primary) hypertension: Secondary | ICD-10-CM

## 2012-10-05 NOTE — Patient Instructions (Signed)
Your physician recommends that you schedule a follow-up appointment in: TO BE DETERMINED   

## 2012-10-05 NOTE — Progress Notes (Signed)
Pt present for blood pressure check. BP is 155/81. Pt voiced no complaints this visit. Please advise if any changes.  Last OV:135/87 67 15 mm change in systolic blood pressure at this visit without symptoms. I doubt that orthostasis accounts for her long-standing dizziness. Evaluation by an otolaryngologist is advised. Patient has had borderline systolic hypertension in the past. She will monitor in non-healthcare settings and return for blood pressure check in one month.

## 2012-10-06 ENCOUNTER — Telehealth: Payer: Self-pay | Admitting: *Deleted

## 2012-10-06 NOTE — Telephone Encounter (Signed)
PLEASE NOTE ALL OPEN ENCOUNTERS TO BE CLOSED AND CHANGED INTO PHONE NOTES PER YELENA LESTER DUE TO CHANGE OVER INTO [facility]  PLEASE ADVISE ON THE FOLLOWING NURSE VISIT  Pt present for blood pressure check. BP is 155/81. Pt voiced no complaints this visit.  Please advise if any changes.  Last OV:135/87 67  15 mm change in systolic blood pressure at this visit without symptoms. I doubt that orthostasis accounts for her long-standing dizziness. Evaluation by an otolaryngologist is advised. Patient has had borderline systolic hypertension in the past. She will monitor in non-healthcare settings and return for blood pressure check in one month.    PLEASE SEND REPLY TO HOLLEY Thermal LPN

## 2012-10-07 NOTE — Telephone Encounter (Signed)
Called pt. She is coming for a BP check on Nov 09 2012 and bringing a BP log in with her.

## 2012-10-07 NOTE — Telephone Encounter (Signed)
I agree, have patient return in 1 month with a blood pressure log.     Dina Rich MD

## 2012-10-26 ENCOUNTER — Other Ambulatory Visit: Payer: Self-pay | Admitting: Cardiology

## 2012-11-09 ENCOUNTER — Ambulatory Visit (INDEPENDENT_AMBULATORY_CARE_PROVIDER_SITE_OTHER): Payer: Medicare Other | Admitting: *Deleted

## 2012-11-09 VITALS — BP 142/72 | HR 62

## 2012-11-09 DIAGNOSIS — Z136 Encounter for screening for cardiovascular disorders: Secondary | ICD-10-CM

## 2012-11-09 DIAGNOSIS — Z0130 Encounter for examination of blood pressure without abnormal findings: Secondary | ICD-10-CM

## 2012-11-09 NOTE — Patient Instructions (Signed)
Your physician recommends that you schedule a follow-up appointment and any new medications in: to be determined

## 2012-11-09 NOTE — Progress Notes (Signed)
Nurse visit :BP today 142/72 manual  pulse 64  Per daughter BP varies with patients agitation   Please advise   C.Les Pou RN

## 2012-11-12 ENCOUNTER — Telehealth: Payer: Self-pay

## 2012-11-12 NOTE — Progress Notes (Signed)
Pt informed to continue same BP meds per MD

## 2012-11-12 NOTE — Telephone Encounter (Signed)
Patient informed to continue same medicines for BP per Dr.Koneswaran

## 2012-12-21 ENCOUNTER — Ambulatory Visit (HOSPITAL_COMMUNITY)
Admission: RE | Admit: 2012-12-21 | Discharge: 2012-12-21 | Disposition: A | Discharge status: Home / Self Care | Payer: Medicare Other | Source: Ambulatory Visit | Attending: Otolaryngology | Admitting: Otolaryngology

## 2012-12-21 DIAGNOSIS — R42 Dizziness and giddiness: Secondary | ICD-10-CM | POA: Insufficient documentation

## 2012-12-21 DIAGNOSIS — M542 Cervicalgia: Secondary | ICD-10-CM | POA: Insufficient documentation

## 2012-12-21 DIAGNOSIS — Z9181 History of falling: Secondary | ICD-10-CM | POA: Insufficient documentation

## 2012-12-21 DIAGNOSIS — I951 Orthostatic hypotension: Secondary | ICD-10-CM | POA: Insufficient documentation

## 2012-12-21 DIAGNOSIS — IMO0001 Reserved for inherently not codable concepts without codable children: Secondary | ICD-10-CM | POA: Insufficient documentation

## 2012-12-21 NOTE — Evaluation (Signed)
Physical Therapy Evaluation  Patient Details  Name: [name] MRN: [id number] Date of Birth: Feb 21, 1925  Today's Date: 12/21/2012 Time: 7846-9629 PT Time Calculation (min): 45 min Charge:  evaluation             Visit#: 1 of 12  Re-eval: 01/20/13 Assessment Diagnosis: dizziness Prior Therapy: none  Authorization: [employer] medicare    Authorization Visit#: 1 of 10   Past Medical History:  Past Medical History  Diagnosis Date  . CAD (coronary artery disease)     CABG surgery in 06/2005 for left main disease; normal EF; negative stress nuclear 08/2008  . Cerebrovascular disease     1993-CVA; 08/2008-mild atherosclerosis without focal stenosis  . Syncope      Negative even recorder dated 3/08  . Tobacco abuse     60 pack years; continuing  . Orthostatic hypotension   . Diverticulosis   . Osteopenia   . GERD (gastroesophageal reflux disease)   . Asthma     as a child  . Hypertension   . Mental disorder     mild dementia  . Stroke     mild left sided weakness, peripheral vision loss  . UTI (urinary tract infection)   . Anemia    Past Surgical History:  Past Surgical History  Procedure Laterality Date  . Abdominal hysterectomy  1990  . Coronary artery bypass graft  2007    Left main disease-2007  . Ventral hernia repair    . Appendectomy    . Tonsillectomy    . Colonoscopy  10/10/2011    Procedure: COLONOSCOPY;  Surgeon: Malissa Hippo, MD;  Location: [facility];  Service: Endoscopy;  Laterality: N/A;  1230  . Esophagogastroduodenoscopy  10/10/2011    Procedure: ESOPHAGOGASTRODUODENOSCOPY (EGD);  Surgeon: Malissa Hippo, MD;  Location: [facility];  Service: Endoscopy;  Laterality: N/A;  . Cardiac surgery      Subjective Symptoms/Limitations Symptoms: Pt states she is dizzy all the time.  She states that there are times she is real bad and other times she is not so bad. She states bending over is almost impossible. She has been tested at [facility], Nose  and Throat and was - with Dix-Hallpike, - for roll test.   How long can you sit comfortably?: no problem How long can you stand comfortably?: one minute How long can you walk comfortably?: five minutes with quat cane Special Tests: Pt would rate  Patient Stated Goals: dizziness on scale 0-10 would be an 8/10 Pain Assessment Currently in Pain?: Yes Pain Score: 8  Pain Location: Neck Pain Type: Chronic pain    Balance Screening Balance Screen Has the patient fallen in the past 6 months: Yes How many times?: all the times Has the patient had a decrease in activity level because of a fear of falling? : Yes Is the patient reluctant to leave their home because of a fear of falling? : Yes  Prior Functioning  Prior Function Vocation: Retired   Sensation/Coordination/Flexibility/Functional Tests Functional Tests Functional Tests: foto 12 risk adjusted 54 Functional Tests: + tracking + saccades   Exercise/Treatments Mobility/Balance  Berg Balance Test Sit to Stand: Able to stand without using hands and stabilize independently Standing Unsupported: Able to stand 30 seconds unsupported Sitting with Back Unsupported but Feet Supported on Floor or Stool: Able to sit safely and securely 2 minutes Stand to Sit: Controls descent by using hands Transfers: Able to transfer safely, definite need of hands Standing Unsupported with Eyes  Closed: Able to stand 10 seconds safely Standing Ubsupported with Feet Together: Able to place feet together independently but unable to hold for 30 seconds From Standing, Reach Forward with Outstretched Arm: Can reach forward >5 cm safely (2") From Standing Position, Pick up Object from Floor: Unable to pick up shoe, but reaches 2-5 cm (1-2") from shoe and balances independently From Standing Position, Turn to Look Behind Over each Shoulder: Looks behind one side only/other side shows less weight shift Turn 360 Degrees: Able to turn 360 degrees safely but  slowly Standing Unsupported, Alternately Place Feet on Step/Stool: Able to complete 4 steps without aid or supervision Standing Unsupported, One Foot in Front: Able to take small step independently and hold 30 seconds Standing on One Leg: Tries to lift leg/unable to hold 3 seconds but remains standing independently Total Score: 36   Balance Exercises    Seated Other Seated Exercises: eye gaze exercises as well as head turns    Physical Therapy Assessment and Plan PT Assessment and Plan Clinical Impression Statement: Pt is an 79 yo who has been referred to therapy for vestibular rehab.  Pt has - halpike dix and is dizzy all the time leaning towards central vertigo.  Pt will benefit from skilled therapy for eye exercises as well as balance activities to reduce risk of falling. Pt will benefit from skilled therapeutic intervention in order to improve on the following deficits: Decreased activity tolerance;Decreased balance;Pain;Increased fascial restricitons Rehab Potential: Good PT Frequency: Min 3X/week PT Duration: 4 weeks PT Treatment/Interventions: Manual techniques;Therapeutic exercise;Therapeutic activities;Gait training PT Plan: Begin manual to cervical mm to decrease tension; begin balance exercises continue with eye gazes.    Goals Home Exercise Program Pt/caregiver will Perform Home Exercise Program: For improved balance PT Goal: Perform Home Exercise Program - Progress: Goal set today PT Short Term Goals Time to Complete Short Term Goals: 2 weeks PT Short Term Goal 1: Pt to state dizziness is no more than a 6 on scale 0/10 PT Short Term Goal 2: Pt able to SLS for 5 seconds to decrease risk of falling PT Short Term Goal 3: berg balance to be increased by 5 to decrease risk of falling PT Long Term Goals Time to Complete Long Term Goals: 4 weeks PT Long Term Goal 1: Pt to state she has not fallen a week. PT Long Term Goal 2: Pt to be able to SLS for 10 seconds to decrease risk  of falling Long Term Goal 3: Berg balance to be improved 10 pt to decrease risk of falling.  Problem List Patient Active Problem List   Diagnosis Date Noted  . Hx of falling, presenting hazards to health 12/21/2012  . Laboratory test 05/09/2011  . Arteriosclerotic cardiovascular disease (ASCVD)   . Cerebrovascular disease   . Syncope   . Hyperlipidemia 05/07/2009  . Tobacco abuse 05/07/2009  . Orthostatic hypotension 05/07/2009  . GERD (gastroesophageal reflux disease) 09/19/2008  . Osteopenia 09/19/2008    General Behavior During Therapy: WFL for tasks assessed/performed PT Plan of Care PT Home Exercise Plan: made but pt left will give next visit.  GP Functional Limitation: Mobility: Walking and moving around Mobility: Walking and Moving Around Current Status [phone]): At least 80 percent but less than 100 percent impaired, limited or restricted Mobility: Walking and Moving Around Goal Status [phone]): At least 40 percent but less than 60 percent impaired, limited or restricted  RUSSELL,CINDY 12/21/2012, 4:41 PM  Physician Documentation Your signature is required to indicate approval of  the treatment plan as stated above.  Please sign and either send electronically or make a copy of this report for your files and return this physician signed original.   Please mark one 1.__approve of plan  2. ___approve of plan with the following conditions.   ______________________________                                                          _____________________ Physician Signature                                                                                                             Date

## 2012-12-23 ENCOUNTER — Ambulatory Visit (HOSPITAL_COMMUNITY)
Admission: RE | Admit: 2012-12-23 | Discharge: 2012-12-23 | Disposition: A | Discharge status: Home / Self Care | Payer: Medicare Other | Source: Ambulatory Visit | Attending: Family Medicine | Admitting: Family Medicine

## 2012-12-23 NOTE — Progress Notes (Signed)
Physical Therapy Treatment Patient Details  Name: [name] MRN: [id number] Date of Birth: 09/29/1925  Today's Date: 12/23/2012 Time: 1100-1130 PT Time Calculation (min): 30 min Charges: NMR x 20'(1100-1120) Manual x 10'(1120-1130)  Visit#: 2 of 12  Re-eval: 01/20/13  Authorization: [employer] medicare  Authorization Time Period:    Authorization Visit#: 2 of 10   Subjective: Symptoms/Limitations Symptoms: Pt states that she has been trying to work on moving her head more. Pain Assessment Currently in Pain?: Yes Pain Score: 8  Pain Location: Neck Pain Orientation: Left  Exercise/Treatments Seated Other Seated Exercises: eye gaze exercises as well as head turns  Other Seated Exercises: Scapular retraction x 10; Shoulder rows x 10   Physical Therapy Assessment and Plan PT Assessment and Plan Clinical Impression Statement: Pt appears to have most dizziness with cervical rotation and ext/flexion. Dizziness persists for 3-5 minutes at a time. Progressed cervical exercises with multimodal cueing for techniques. Manual techniques completed to left upper trapezius to decrease tightness and pain. Pt will benefit from skilled therapeutic intervention in order to improve on the following deficits: Decreased activity tolerance;Decreased balance;Pain;Increased fascial restricitons Rehab Potential: Good PT Frequency: Min 3X/week PT Duration: 4 weeks PT Treatment/Interventions: Manual techniques;Therapeutic exercise;Therapeutic activities;Gait training PT Plan: Begin manual to cervical mm to decrease tension; begin balance exercises continue with eye gazes.     Problem List Patient Active Problem List   Diagnosis Date Noted  . Hx of falling, presenting hazards to health 12/21/2012  . Laboratory test 05/09/2011  . Arteriosclerotic cardiovascular disease (ASCVD)   . Cerebrovascular disease   . Syncope   . Hyperlipidemia 05/07/2009  . Tobacco abuse 05/07/2009  . Orthostatic hypotension  05/07/2009  . GERD (gastroesophageal reflux disease) 09/19/2008  . Osteopenia 09/19/2008    PT - End of Session Activity Tolerance: Patient tolerated treatment well General Behavior During Therapy: [facility] for tasks assessed/performed  Seth Bake, PTA  12/23/2012, 12:00 PM

## 2013-01-05 ENCOUNTER — Ambulatory Visit (HOSPITAL_COMMUNITY)
Admission: RE | Admit: 2013-01-05 | Discharge: 2013-01-05 | Disposition: A | Discharge status: Home / Self Care | Payer: Medicare Other | Source: Ambulatory Visit | Attending: Family Medicine | Admitting: Family Medicine

## 2013-01-05 NOTE — Progress Notes (Signed)
Physical Therapy Treatment Patient Details  Name: [name] MRN: [id number] Date of Birth: 11-02-1925  Today's Date: 01/05/2013 Time: 1112-1142 PT Time Calculation (min): 30 min  Visit#: 3 of 12  Re-eval: 01/20/13 Authorization: [employer] medicare  Authorization Visit#: 3 of 10  Charges:  therex [id number] (10'), manual [id number] (18')   Subjective: Symptoms/Limitations Symptoms: PT states she remains dizzy.  States her Lt LE also hurts pointing to her Lt quad and medial knee. Pain Assessment Currently in Pain?: Yes Pain Score: 4  Pain Location: Neck Pain Orientation: Left   Exercise/Treatments Machines for Strengthening UBE (Upper Arm Bike): 3 mins backward Seated Exercises Other Seated Exercise: Eye gaze exercises with head turns Other Seated Exercise: scap retractions 10 reps   Manual Therapy Manual Therapy: Massage Massage: bilateral cervical and scap region to decrease spasms and trigger points, friction mass Lt superior border of levator scapula  Physical Therapy Assessment and Plan PT Assessment and Plan Clinical Impression Statement: Pt unable to complete greater than 3 minutes on UBE before increased dizziness.  Trigger point Lt upper trap increased dizziness and only able to tolerate briefly.  Unable to progress to standing balance activities due to inability to control dizziness with static exercises.    Pt required frequent rest breaks during session today due to dizziness.  Noted tightness in bilateral upper traps and scapular regions.   PT Plan: Patient may benefit from manual vestibular manuevers to help  reduce dizziness.     Problem List Patient Active Problem List   Diagnosis Date Noted  . Hx of falling, presenting hazards to health 12/21/2012  . Laboratory test 05/09/2011  . Arteriosclerotic cardiovascular disease (ASCVD)   . Cerebrovascular disease   . Syncope   . Hyperlipidemia 05/07/2009  . Tobacco abuse 05/07/2009  . Orthostatic hypotension  05/07/2009  . GERD (gastroesophageal reflux disease) 09/19/2008  . Osteopenia 09/19/2008    PT - End of Session Activity Tolerance: Patient tolerated treatment well General Behavior During Therapy: WFL for tasks assessed/performed   Lurena Nida, PTA/CLT 01/05/2013, 12:00 PM

## 2013-01-11 ENCOUNTER — Ambulatory Visit (HOSPITAL_COMMUNITY)
Admission: RE | Admit: 2013-01-11 | Discharge: 2013-01-11 | Disposition: A | Discharge status: Home / Self Care | Payer: Medicare Other | Source: Ambulatory Visit | Attending: Family Medicine | Admitting: Family Medicine

## 2013-01-11 DIAGNOSIS — IMO0001 Reserved for inherently not codable concepts without codable children: Secondary | ICD-10-CM | POA: Insufficient documentation

## 2013-01-11 DIAGNOSIS — I951 Orthostatic hypotension: Secondary | ICD-10-CM | POA: Insufficient documentation

## 2013-01-11 DIAGNOSIS — M542 Cervicalgia: Secondary | ICD-10-CM | POA: Insufficient documentation

## 2013-01-11 DIAGNOSIS — Z9181 History of falling: Secondary | ICD-10-CM | POA: Insufficient documentation

## 2013-01-11 DIAGNOSIS — R42 Dizziness and giddiness: Secondary | ICD-10-CM | POA: Insufficient documentation

## 2013-01-11 NOTE — Progress Notes (Signed)
Physical Therapy Treatment Patient Details  Name: MYCALA WARSHAWSKY MRN: [id number] Date of Birth: 1925-12-05  Today's Date: 01/11/2013 Time: 1015-1055 PT Time Calculation (min): 40 min Manual 1015-1040' there ex [id number] Visit#: 4 of 12  Re-eval: 01/20/13    Authorization: [employer] medicare  Subjective: Symptoms/Limitations Symptoms: Pt states she has not been doing her exercises as much as she should.  Pt states that she is having quite a bit of sorness on the Rt side of her neck. Pain Assessment Currently in Pain?: Yes Pain Score: 6  Pain Location: Neck Pain Type: Chronic pain    Exercise/Treatments       Supine Exercises Cervical Rotation: 10 reps (x 4) Lateral Flexion: 5 reps (x3) Other Supine Exercise: eye gaze exercises x 4 reps.   Sitting:  Emphasis good sitting posture. Manual Therapy Manual Therapy: Massage Massage: while supine to Rt cervical posterior and scalene mm to reduce spasm and tightness .  Myofascial and Grade II jt mobs to promote increased ROM decreased fascial restrictions.  Physical Therapy Assessment and Plan PT Assessment and Plan Clinical Impression Statement: Pt unable to see out of Lt eye which is most likely contributing to vertigo.  Pt has slight increased dizziness with eye gaze, increased with rotation and significant increase with lateral bending. Encouraged pt to complete exercises more often at home.   PT Plan: Pt vertigo appears central in nature continue habituation exercies as well as manual to decrease cervical tension.       Problem List Patient Active Problem List   Diagnosis Date Noted  . Hx of falling, presenting hazards to health 12/21/2012  . Laboratory test 05/09/2011  . Arteriosclerotic cardiovascular disease (ASCVD)   . Cerebrovascular disease   . Syncope   . Hyperlipidemia 05/07/2009  . Tobacco abuse 05/07/2009  . Orthostatic hypotension 05/07/2009  . GERD (gastroesophageal reflux disease) 09/19/2008  . Osteopenia  09/19/2008    PT - End of Session Activity Tolerance: Patient tolerated treatment well PT Plan of Care PT Home Exercise Plan: given  GP    RUSSELL,CINDY 01/11/2013, 11:10 AM

## 2013-01-13 ENCOUNTER — Ambulatory Visit (HOSPITAL_COMMUNITY)
Admission: RE | Admit: 2013-01-13 | Discharge: 2013-01-13 | Disposition: A | Discharge status: Home / Self Care | Payer: Medicare Other | Source: Ambulatory Visit | Attending: Family Medicine | Admitting: Family Medicine

## 2013-01-13 ENCOUNTER — Telehealth: Payer: Self-pay | Admitting: *Deleted

## 2013-01-13 NOTE — Progress Notes (Signed)
Physical Therapy Treatment Patient Details  Name: [name] MRN: [id number] Date of Birth: 03-Mar-1925  Today's Date: 01/13/2013 Time: 1105-1140 PT Time Calculation (min): 35 min Charges: NMR x 25'   Visit#: 5 of 12  Re-eval: 01/20/13  Authorization: [employer] medicare   Subjective: Symptoms/Limitations Symptoms: Pt states that she tries to set aside time to complete her home exercises.  Pain Assessment Currently in Pain?: No/denies  Exercise/Treatments Seated Other Seated Exercises: eye gaze exercises as well as head turns    Physical Therapy Assessment and Plan PT Assessment and Plan Clinical Impression Statement: Pt has significant increase in dizziness with eyes exercises. Pt complains of right jaw pain during treatment. Blood pressure was measured at 168/63 and pt's heart rate was 38 in supine. Pt was taken to [facility] to be evaluated. PT Plan: F/U with pt after seeing heart doctor. Continue habituation exercises and manual to decrease cervical tension.     Problem List Patient Active Problem List   Diagnosis Date Noted  . Hx of falling, presenting hazards to health 12/21/2012  . Laboratory test 05/09/2011  . Arteriosclerotic cardiovascular disease (ASCVD)   . Cerebrovascular disease   . Syncope   . Hyperlipidemia 05/07/2009  . Tobacco abuse 05/07/2009  . Orthostatic hypotension 05/07/2009  . GERD (gastroesophageal reflux disease) 09/19/2008  . Osteopenia 09/19/2008    PT - End of Session Activity Tolerance: Patient tolerated treatment well General Behavior During Therapy: [facility] for tasks assessed/performed   Rachelle Hora, PTA  01/13/2013, 12:15 PM

## 2013-01-13 NOTE — Telephone Encounter (Signed)
Physical therapy walked pt in our office stating that she had chest pain and low heart rate of 38.  Took vitals signs BP 166/82 and pulse of 66. Pt states that she has no more chest pain and just dizzy. Pt has been having this symptom ongoing for a while. Spoke to [facility], NP, she states for pt to go home and rest. KL, NP also recommends that she stop physical therapy until seen by our office on Monday 01/18/13.

## 2013-01-15 ENCOUNTER — Inpatient Hospital Stay (HOSPITAL_COMMUNITY): Admission: RE | Admit: 2013-01-15 | Payer: Medicare Other | Source: Ambulatory Visit | Admitting: Physical Therapy

## 2013-01-18 ENCOUNTER — Ambulatory Visit (HOSPITAL_COMMUNITY): Payer: Medicare Other | Admitting: *Deleted

## 2013-01-18 ENCOUNTER — Ambulatory Visit (INDEPENDENT_AMBULATORY_CARE_PROVIDER_SITE_OTHER): Payer: Medicare Other | Admitting: Adult Health

## 2013-01-18 ENCOUNTER — Encounter: Payer: Self-pay | Admitting: Adult Health

## 2013-01-18 VITALS — BP 136/58 | HR 61 | Ht 64.0 in | Wt 122.0 lb

## 2013-01-18 DIAGNOSIS — I498 Other specified cardiac arrhythmias: Secondary | ICD-10-CM

## 2013-01-18 DIAGNOSIS — R42 Dizziness and giddiness: Secondary | ICD-10-CM

## 2013-01-18 DIAGNOSIS — I499 Cardiac arrhythmia, unspecified: Secondary | ICD-10-CM

## 2013-01-18 DIAGNOSIS — R001 Bradycardia, unspecified: Secondary | ICD-10-CM

## 2013-01-18 DIAGNOSIS — I2510 Atherosclerotic heart disease of native coronary artery without angina pectoris: Secondary | ICD-10-CM

## 2013-01-18 DIAGNOSIS — I7090 Unspecified atherosclerosis: Secondary | ICD-10-CM

## 2013-01-18 DIAGNOSIS — E785 Hyperlipidemia, unspecified: Secondary | ICD-10-CM

## 2013-01-18 NOTE — Progress Notes (Deleted)
Name: [name]    DOB: [birth year]  Age: 80 y.o.  MR#: [id number]       PCP:  Purvis Kilts, MD      Insurance: Payor: Onnie Boer MEDICARE / Plan: AARP MEDICARE COMPLETE / Product Type: *No Product type* /   CC:    Chief Complaint  Patient presents with  . Coronary Artery Disease    VS Filed Vitals:   01/18/13 1430  BP: 136/58  Pulse: 61  Height: '5\' 4"'  (1.626 m)  Weight: 122 lb (55.339 kg)    Weights Current Weight  01/18/13 122 lb (55.339 kg)  10/05/12 118 lb 12 oz (53.865 kg)  09/02/12 116 lb (52.617 kg)    Blood Pressure  BP Readings from Last 3 Encounters:  01/18/13 136/58  11/09/12 142/72  10/05/12 155/81     Admit date:  (Not on file) Last encounter with RMR:  Visit date not found   Allergy Cephalexin; Contrast media; Iohexol; Penicillins; Shellfish allergy; and Sulfa antibiotics  Current Outpatient Prescriptions  Medication Sig Dispense Refill  . acetaminophen (TYLENOL) 325 MG tablet Take 650 mg by mouth every 6 (six) hours as needed. For pain      . alendronate (FOSAMAX) 70 MG tablet Take 70 mg by mouth every Saturday. Take with a full glass of water on an empty stomach.      Marland Kitchen aspirin 81 MG tablet Take 81 mg by mouth daily.        Marland Kitchen atorvastatin (LIPITOR) 10 MG tablet Take 10 mg by mouth at bedtime.      . Calcium-Vitamin D (CALTRATE 600 PLUS-VIT D PO) Take 2 tablets by mouth daily.       Marland Kitchen donepezil (ARICEPT) 10 MG tablet Take 10 mg by mouth at bedtime.       . gabapentin (NEURONTIN) 100 MG capsule Take 1 capsule (100 mg total) by mouth 3 (three) times daily.  90 capsule  5  . meclizine (ANTIVERT) 12.5 MG tablet Take 1 tablet (12.5 mg total) by mouth 3 (three) times daily as needed (May take 2 tablets if 1 tablet is ineffective).  30 tablet  3  . nitroGLYCERIN (NITROSTAT) 0.4 MG SL tablet Place 1 tablet (0.4 mg total) under the tongue every 5 (five) minutes as needed for chest pain.  25 tablet  6  . Omega-3 Fatty Acids (FISH OIL) 1200 MG CAPS  Take 1,200 mg by mouth every morning.       . vitamin B-12 (CYANOCOBALAMIN) 1000 MCG tablet Take 1,000 mcg by mouth every morning.       . vitamin C (ASCORBIC ACID) 500 MG tablet Take 500 mg by mouth every morning.       . [DISCONTINUED] METOPROLOL SUCCINATE ER PO Take by mouth.       No current facility-administered medications for this visit.    Discontinued Meds:   There are no discontinued medications.  Patient Active Problem List   Diagnosis Date Noted  . Hx of falling, presenting hazards to health 12/21/2012  . Laboratory test 05/09/2011  . Arteriosclerotic cardiovascular disease (ASCVD)   . Cerebrovascular disease   . Syncope   . Hyperlipidemia 05/07/2009  . Tobacco abuse 05/07/2009  . Orthostatic hypotension 05/07/2009  . GERD (gastroesophageal reflux disease) 09/19/2008  . Osteopenia 09/19/2008    LABS    Component Value Date/Time   NA 142 08/14/2012 1650   NA 138 06/15/2010 0845   NA 140 05/10/2010 1213   K 3.9 08/14/2012 1650  K 4.7 06/15/2010 0845   K 5.5* 05/10/2010 1213   CL 106 08/14/2012 1650   CL 103 06/15/2010 0845   CL 103 05/10/2010 1213   CO2 28 08/14/2012 1650   CO2 27 06/15/2010 0845   CO2 27 05/10/2010 1213   GLUCOSE 89 08/14/2012 1650   GLUCOSE 78 06/15/2010 0845   GLUCOSE 86 05/10/2010 1213   BUN 17 08/14/2012 1650   BUN 20 06/15/2010 0845   BUN 14 05/10/2010 1213   CREATININE 0.90 08/14/2012 1650   CREATININE 0.82 06/15/2010 0845   CREATININE 0.85 05/10/2010 1213   CREATININE 0.74 05/15/2009 2130   CREATININE 0.54 08/30/2008 0355   CALCIUM 9.2 08/14/2012 1650   CALCIUM 9.9 06/15/2010 0845   CALCIUM 10.3 05/10/2010 1213   GFRNONAA 57* 08/14/2012 1650   GFRNONAA >60 08/30/2008 0355   GFRNONAA >60 08/29/2008 1040   GFRAA 66* 08/14/2012 1650   GFRAA  Value: >60        The eGFR has been calculated using the MDRD equation. This calculation has not been validated in all clinical situations. eGFR's persistently <60 mL/min signify possible Chronic Kidney Disease. 08/30/2008 0355   GFRAA  Value:  >60        The eGFR has been calculated using the MDRD equation. This calculation has not been validated in all clinical situations. eGFR's persistently <60 mL/min signify possible Chronic Kidney Disease. 08/29/2008 1040   CMP     Component Value Date/Time   NA 142 08/14/2012 1650   K 3.9 08/14/2012 1650   CL 106 08/14/2012 1650   CO2 28 08/14/2012 1650   GLUCOSE 89 08/14/2012 1650   BUN 17 08/14/2012 1650   CREATININE 0.90 08/14/2012 1650   CREATININE 0.82 06/15/2010 0845   CALCIUM 9.2 08/14/2012 1650   PROT 6.0 08/14/2012 1650   ALBUMIN 3.5 08/14/2012 1650   AST 19 08/14/2012 1650   ALT 12 08/14/2012 1650   ALKPHOS 66 08/14/2012 1650   BILITOT 0.2* 08/14/2012 1650   GFRNONAA 57* 08/14/2012 1650   GFRAA 66* 08/14/2012 1650       Component Value Date/Time   WBC 7.5 08/15/2012 0520   WBC 7.0 08/14/2012 1650   WBC 7.3 05/08/2011 0925   HGB 12.7 08/15/2012 0520   HGB 12.9 08/14/2012 1650   HGB 13.5 11/09/2011 0826   HCT 37.8 08/15/2012 0520   HCT 39.0 08/14/2012 1650   HCT 39.9 11/09/2011 0826   MCV 93.8 08/15/2012 0520   MCV 95.8 08/14/2012 1650   MCV 85.6 05/08/2011 0925    Lipid Panel     Component Value Date/Time   CHOL 160 05/08/2011 0925   TRIG 91 05/08/2011 0925   HDL 67 05/08/2011 0925   CHOLHDL 2.4 05/08/2011 0925   VLDL 18 05/08/2011 0925   LDLCALC 75 05/08/2011 0925    ABG    Component Value Date/Time   PHART 7.426* 08/31/2008 1340   PCO2ART 36.3 08/31/2008 1340   PO2ART 89.6 08/31/2008 1340   HCO3 23.4 08/31/2008 1340   TCO2 20.2 08/31/2008 1340   ACIDBASEDEF 0.4 08/31/2008 1340   O2SAT 96.7 08/31/2008 1340     Lab Results  Component Value Date   TSH 3.925 ***Test methodology is 3rd generation TSH**** 08/30/2008   BNP (last 3 results) No results found for this basename: PROBNP,  in the last 8760 hours Cardiac Panel (last 3 results) No results found for this basename: CKTOTAL, CKMB, TROPONINI, RELINDX,  in the last 72 hours  Iron/TIBC/Ferritin No results found  for this basename: iron, tibc, ferritin      EKG Orders placed in visit on 01/18/13  . EKG 12-LEAD     Prior Assessment and Plan Problem List as of 01/18/2013   Hyperlipidemia   Last Assessment & Plan   09/02/2012 Office Visit Written 09/02/2012 12:11 PM by Yehuda Savannah, MD     Near optimal lipid values when last assessed more than one year ago on low-dose atorvastatin. No adjustment of therapy is necessary.    Tobacco abuse   Orthostatic hypotension   Last Assessment & Plan   09/02/2012 Office Visit Edited 09/02/2012 12:13 PM by Yehuda Savannah, MD     15 mm change in systolic blood pressure at this visit without symptoms. I doubt that orthostasis accounts for her long-standing dizziness. Evaluation by an otolaryngologist is advised.  Patient has had borderline systolic hypertension in the past. She will monitor in non-healthcare settings and return for blood pressure check in one month.    GERD (gastroesophageal reflux disease)   Last Assessment & Plan   02/05/2012 Office Visit Written 02/05/2012  7:39 PM by Yehuda Savannah, MD     Patient denies a history of acid reflux or hiatal hernia, but if chest discomfort persists, a trial of PPI could be considered.    Osteopenia   Arteriosclerotic cardiovascular disease (ASCVD)   Last Assessment & Plan   09/02/2012 Office Visit Written 09/02/2012 12:09 PM by Yehuda Savannah, MD     Patient has a history of intermittent chest discomfort with negative stress nuclear studies in 2010, 2013 and 01/2012. She has not required sublingual nitroglycerin since hospital admission. We will continue to treat symptomatically without further diagnostic testing at present.    Cerebrovascular disease   Last Assessment & Plan   09/02/2012 Office Visit Written 09/02/2012 12:10 PM by Yehuda Savannah, MD     Minimal carotid atherosclerosis when last assessed 8 months ago. Due to patient's advanced age, the benefit of serial testing is probably limited. I certainly would not consider repeat carotid  imaging for at least 3 years.    Syncope   Last Assessment & Plan   03/06/2012 Office Visit Edited 03/07/2012 10:44 PM by Yehuda Savannah, MD     Patient continues to have dizziness, but no syncope. Symptoms have improved spontaneously. She did not fill the prescription previously given to her for meclizine and is encouraged to do so.    Laboratory test   Hx of falling, presenting hazards to health       Imaging: No results found.

## 2013-01-18 NOTE — Assessment & Plan Note (Signed)
Due to frequent ectopy, I will repeat her echocardiogram. We will check a BMET and a magnesium. The patient has been having chronic diarrhea I am concerned that this may be etiology of ectopy. She may need to be on potassium replacement . We will not add a beta blocker at this time. Cardiac monitor we will put on for one week to evaluate duration and rhythm. She denies any chest pain associated with this. Dizziness has been going on "for years" and therefore I am not concerned that she will have a syncopal episode. However, R. on T. phenomenon may be possible with frequent ectopy. We will follow her closely.

## 2013-01-18 NOTE — Assessment & Plan Note (Signed)
She is followed by PCP Dr. Hilma Favors for labs.

## 2013-01-18 NOTE — Patient Instructions (Signed)
Your physician recommends that you schedule a follow-up appointment in: 1-2 weeks  Your physician has recommended that you wear an event monitor. Event monitors are medical devices that record the heart's electrical activity. Doctors most often [place] these monitors to diagnose arrhythmias. Arrhythmias are problems with the speed or rhythm of the heartbeat. The monitor is a small, portable device. You can wear one while you do your normal daily activities. This is usually used to diagnose what is causing palpitations/syncope (passing out). 7 days  Your physician recommends that you return for lab work today. BMET, MAG

## 2013-01-18 NOTE — Progress Notes (Signed)
HPI: [name] is a 80 year old former patient of Dr. Jacqulyn Ducking we are following for ongoing assessment and management of CAD, status post coronary artery bypass grafting in 2007. She has chronic dizziness and is followed by ENT. The patient was mildly orthostatic in the last office visit but no medications were changed. Dr. Lattie Haw felt that her dizziness was not related to this. The patient was being followed by physical therapy and on 01/13/2013 she had a low heart rate of 38 beats per minute. Vital signs were 166/82 with a pulse of 66. The patient had symptoms of dizziness. She is here for further evaluation. She was recommended to stop physical therapy until seen by our office.   She comes in with her daughter, and is a poor historian. The patient states she continues to have some dizziness. This apparently has been going on for several years per her daughter. She denies chest pain. Dyspnea on exertion, or near syncope.  Allergies  Allergen Reactions  . Cephalexin Hives  . Contrast Media [Iodinated Diagnostic Agents]     Unknown  . Iohexol     Urticaria, emesis, numbness during IVP performed at [facility] years ago  . Penicillins Hives  . Shellfish Allergy Other (See Comments)    Unknown  . Sulfa Antibiotics Hives    Current Outpatient Prescriptions  Medication Sig Dispense Refill  . acetaminophen (TYLENOL) 325 MG tablet Take 650 mg by mouth every 6 (six) hours as needed. For pain      . alendronate (FOSAMAX) 70 MG tablet Take 70 mg by mouth every Saturday. Take with a full glass of water on an empty stomach.      Marland Kitchen aspirin 81 MG tablet Take 81 mg by mouth daily.        Marland Kitchen atorvastatin (LIPITOR) 10 MG tablet Take 10 mg by mouth at bedtime.      . Calcium-Vitamin D (CALTRATE 600 PLUS-VIT D PO) Take 2 tablets by mouth daily.       Marland Kitchen donepezil (ARICEPT) 10 MG tablet Take 10 mg by mouth at bedtime.       . gabapentin (NEURONTIN) 100 MG capsule Take 1 capsule (100 mg  total) by mouth 3 (three) times daily.  90 capsule  5  . meclizine (ANTIVERT) 12.5 MG tablet Take 1 tablet (12.5 mg total) by mouth 3 (three) times daily as needed (May take 2 tablets if 1 tablet is ineffective).  30 tablet  3  . nitroGLYCERIN (NITROSTAT) 0.4 MG SL tablet Place 1 tablet (0.4 mg total) under the tongue every 5 (five) minutes as needed for chest pain.  25 tablet  6  . Omega-3 Fatty Acids (FISH OIL) 1200 MG CAPS Take 1,200 mg by mouth every morning.       . vitamin B-12 (CYANOCOBALAMIN) 1000 MCG tablet Take 1,000 mcg by mouth every morning.       . vitamin C (ASCORBIC ACID) 500 MG tablet Take 500 mg by mouth every morning.       . [DISCONTINUED] METOPROLOL SUCCINATE ER PO Take by mouth.       No current facility-administered medications for this visit.    Past Medical History  Diagnosis Date  . CAD (coronary artery disease)     CABG surgery in 06/2005 for left main disease; normal EF; negative stress nuclear 08/2008  . Cerebrovascular disease     1993-CVA; 08/2008-mild atherosclerosis without focal stenosis  . Syncope      Negative even recorder  dated 3/08  . Tobacco abuse     60 pack years; continuing  . Orthostatic hypotension   . Diverticulosis   . Osteopenia   . GERD (gastroesophageal reflux disease)   . Asthma     as a child  . Hypertension   . Mental disorder     mild dementia  . Stroke     mild left sided weakness, peripheral vision loss  . UTI (urinary tract infection)   . Anemia     Past Surgical History  Procedure Laterality Date  . Abdominal hysterectomy  1990  . Coronary artery bypass graft  2007    Left main disease-2007  . Ventral hernia repair    . Appendectomy    . Tonsillectomy    . Colonoscopy  10/10/2011    Procedure: COLONOSCOPY;  Surgeon: Rogene Houston, MD;  Location: [facility];  Service: Endoscopy;  Laterality: N/A;  1230  . Esophagogastroduodenoscopy  10/10/2011    Procedure: ESOPHAGOGASTRODUODENOSCOPY (EGD);  Surgeon: Rogene Houston, MD;  Location: [facility];  Service: Endoscopy;  Laterality: N/A;  . Cardiac surgery      OHF:GBMSXJ of systems complete and found to be negative unless listed above  PHYSICAL EXAM BP 136/58  Pulse 61  Ht 5\' 4"  (1.626 m)  Wt 122 lb (55.339 kg)  BMI 20.93 kg/m2  General: Well developed, well nourished, in no acute distress Head: Eyes PERRLA, No xanthomas.   Normal cephalic and atramatic  Lungs: Clear bilaterally to auscultation and percussion. Heart: HRRR S1 S2, irregular, without MRG.  Pulses are 2+ & equal.            No carotid bruit. No JVD.  No abdominal bruits. No femoral bruits. Abdomen: Bowel sounds are positive, abdomen soft and non-tender without masses or                  Hernia's noted. Msk:  Back normal, normal gait. Normal strength and tone for age. Extremities: No clubbing, cyanosis or edema.  DP +1 Neuro: Alert and oriented X 3. Psych:  Flat affect, responds appropriately  EKG: SR, bigeminy, rate of 68 bpm with non-specific T-wave abnormalities.  ASSESSMENT AND PLAN

## 2013-01-19 ENCOUNTER — Telehealth: Payer: Self-pay

## 2013-01-19 ENCOUNTER — Telehealth: Payer: Self-pay | Admitting: *Deleted

## 2013-01-19 ENCOUNTER — Other Ambulatory Visit: Payer: Self-pay | Admitting: *Deleted

## 2013-01-19 LAB — BASIC METABOLIC PANEL
BUN: 19 mg/dL (ref 6–23)
CHLORIDE: 108 meq/L (ref 96–112)
CO2: 28 mEq/L (ref 19–32)
CREATININE: 0.71 mg/dL (ref 0.50–1.10)
Calcium: 9.6 mg/dL (ref 8.4–10.5)
Glucose, Bld: 90 mg/dL (ref 70–99)
POTASSIUM: 4.3 meq/L (ref 3.5–5.3)
Sodium: 142 mEq/L (ref 135–145)

## 2013-01-19 LAB — MAGNESIUM: MAGNESIUM: 2.1 mg/dL (ref 1.5–2.5)

## 2013-01-19 MED ORDER — ATENOLOL 25 MG PO TABS: 25.0000 mg | ORAL_TABLET | Freq: Every day | ORAL | Status: DC

## 2013-01-19 NOTE — Telephone Encounter (Signed)
Called number provided to speak to [place] at ext [phone], left vm to have Rocky call our office, this nurse called the pt and she advised [facility] has not contacted her at this point, advised pt to accept any calls from [phone],numbers pt understood, will contact [place] again tomorrow

## 2013-01-19 NOTE — Telephone Encounter (Signed)
[facility] needs more information on patient

## 2013-01-19 NOTE — Telephone Encounter (Signed)
Rx called as directed. 

## 2013-01-19 NOTE — Telephone Encounter (Signed)
Message copied by Bernita Raisin on Tue Jan 19, 2013 12:47 PM ------      Message from: Lendon Colonel      Created: Tue Jan 19, 2013  7:47 AM       Labs look ok. Please start her on atenolol 25 mg daily. Continue to wear cardiac monitor. ------

## 2013-01-20 ENCOUNTER — Ambulatory Visit (HOSPITAL_COMMUNITY): Payer: Medicare Other | Admitting: *Deleted

## 2013-01-21 ENCOUNTER — Telehealth: Payer: Self-pay | Admitting: *Deleted

## 2013-01-21 NOTE — Telephone Encounter (Signed)
Pt and daughter came into office and spoke with CC RN to advise the event monitor had arrived and the pt was currently wearing the monitor, pt daughter also noted that she realized after counting the pt new Atenolol 25mg  prescription QD started 01-19-13 per lab results that the pt's pill count was off and after discussing with the pt she took one 25mg  atenolol last night and another atenolol this am, CC RN advised this is more than likely the cause of the pt issues/fatigue to hold the atenolol, call office tomorrow with documented BP/HR and update on the pt, pt then exited office with daughter, this nurse received a call from [facility] prior to closing this note and was advised by Jones Bales that the pt has a critical reading of multifocal PVC's in the amount of 37 in the 2 minutes time frame, report advised to Dr. Bronson Ing verbal given for pt  to hold atenolol and see Dr Harl Bowie in the [facility] office tomorrow to address all her concerns, pt cardiac report printed and faxed to [place] office per no one in office to scan at this time, pt daughter accepted apt in [place] office for 4:20pm per pt notes her husband has an apt in [place] at 1pm, pt advised the importance of keeping this apt and given the [place] office number to contact in case she can not come in, pt then advised she is feeling fine, denies chest pain/SOB/swelling/dizzyness/headache at this time, pt advise to go to the ED for evaluation if she has any worsening sxs, pt understood all instructions, notation forwarded to Dr Harl Bowie to be aware

## 2013-01-21 NOTE — Telephone Encounter (Signed)
[facility] and everything is complete now.

## 2013-01-21 NOTE — Telephone Encounter (Signed)
Daughter called states that pt just was put on Atenolol. Pt's daughter states that she is real weak and tired. BP is 148/87 with a pulse of 48. Our documented rate was around 60. Heart Monitor will not arrive until tomorrow tomorrow. Per [employer] , RN,  stopped Atenolol and continue to monitor hr and BP and call our office tomorrow with these reading so that [place], NP can read them.

## 2013-01-22 ENCOUNTER — Inpatient Hospital Stay (HOSPITAL_COMMUNITY): Admission: RE | Admit: 2013-01-22 | Payer: Medicare Other | Source: Ambulatory Visit | Admitting: Physical Therapy

## 2013-01-22 ENCOUNTER — Ambulatory Visit: Payer: Medicare Other | Admitting: Cardiology

## 2013-01-22 NOTE — Telephone Encounter (Signed)
Ok

## 2013-01-22 NOTE — Telephone Encounter (Signed)
Noted, thanks!

## 2013-01-22 NOTE — Telephone Encounter (Signed)
This nurse called pt daughter to get an update concerning pt current state, noted she is not with the pt at this time and only spoke to pt briefly this am, advised pt daughter that Dr. Bronson Ing has advised the pt will need to be evaluated by an EP per PVC concerns, however pt home number is busy times three, pt daughter aware of change and pending cancellation for apt scheduled this afternoon with Dr Harl Bowie in the [place] office, this nurse printed pt current readings from [facility] and report given to Dr. Bronson Ing for review per inability to contact pt at this time, while waiting for Dr. Bronson Ing to review the report pt called office to advise that she is feeling somewhat better and not as fatigued, pt advised to continue to hold her atenolol and check her BP regularly and call office with any worsening sxs, pt understood, Dr. Bronson Ing then advised to have the pt cancel her apt with Dr Harl Bowie today and see Dr Rayann Heman instead, [facility] rep [facility] advised she has pt scheduled to see Dr Rayann Heman 01-27-13 at 2:30pm and pt will keep her apt with Dr. Bronson Ing next week 02-02-13 at 11:20am, [facility] rep [facility] will advise pt daughter concerning all scheduled apt changes, pt/daughter will call back to our office with any further concerns as needed

## 2013-01-25 ENCOUNTER — Ambulatory Visit: Payer: Medicare Other | Admitting: Adult Health

## 2013-01-25 ENCOUNTER — Telehealth: Payer: Self-pay | Admitting: *Deleted

## 2013-01-25 NOTE — Telephone Encounter (Signed)
Noted pt husband called into office to advise [facility] TM[facility] that the pt needs to be seen today, pt scheduled to see Leonia Reader NP at 2:10am today, Leonia Reader NP advised to contact pt family member to triage need for OV today per Dr. Bronson Ing advised 01-22-13 the pt will not need to be seen by Dr Harl Bowie as scheduled per pt will need to be evaluated by EP first, this nurse called pt husband to verify reason for OV today, pt daughter advised the pt husband noted the pt is not doing well, pt daughter unable to clarify the reason from the husband other than dizzyness, pt daughter noted BP yesterday 217/? And when prompted to take again 4 hours later the pt noted Bp as 162/?, this nurse called pt home and pt advised dizzyness has been going on most all day for a week or two now, noted BP 167/107, 162/97 on the 17th, 117/88, 136/75 on the 18th, had pt take BP while on phone with this nurse to check noted error message and not sure when they changed the batteries and they do not have any in the house at this time, error message times 2, advised pt to purchase batteries soon to have a way to read the BP, pt understood,  Pt denies SOB,swelling, pt notes chest pressure that lasts for a few seconds for a week, pt does not use nitro as advised, pt educated on nitro usage, pt notes she gets tired really easy, pt confirmed that she is still holding the atenolol, advised we will speak to Leonia Reader NP to see what the next steps will be, pt understood if sxs worsen to go to the ED, this nurse spoke to Leonia Reader NP which advised to rest and per current sxs and  these current issues are noted as ongoing issues that will need to be addressed by the EP doctor at upcoming [place] 01-27-13, pt can see PCP if she feels that she needs to be seen today per her Cardiac needs will need to be addressed by EP, called pt daughter to advise, pt daughter understood and noted the pt feels her dizzyness will be better with going back on  meclozine however pt MD advised they do not agree with that regimen, pt daughter advised she will take the pt to her PCP if she would like to go however she notes the pt does not want to accept any advice given and that she is not sure what to do for her anymore, this nurse advised we are glad to see her however at this point the best way to address her current SXS is evaluation with EP, daughter and pt agreed and will get new batteries for the BP machine, Pt will call back to our office with any further concerns if necessary, pt will keep EP apt on 01-27-13

## 2013-01-27 ENCOUNTER — Encounter: Payer: Self-pay | Admitting: Internal Medicine

## 2013-01-27 ENCOUNTER — Ambulatory Visit (INDEPENDENT_AMBULATORY_CARE_PROVIDER_SITE_OTHER): Payer: Medicare Other | Admitting: Internal Medicine

## 2013-01-27 VITALS — BP 150/82 | HR 76 | Ht 64.0 in | Wt 123.6 lb

## 2013-01-27 DIAGNOSIS — I495 Sick sinus syndrome: Secondary | ICD-10-CM

## 2013-01-27 DIAGNOSIS — I10 Essential (primary) hypertension: Secondary | ICD-10-CM

## 2013-01-27 DIAGNOSIS — I493 Ventricular premature depolarization: Secondary | ICD-10-CM | POA: Insufficient documentation

## 2013-01-27 DIAGNOSIS — I2510 Atherosclerotic heart disease of native coronary artery without angina pectoris: Secondary | ICD-10-CM

## 2013-01-27 DIAGNOSIS — I4949 Other premature depolarization: Secondary | ICD-10-CM

## 2013-01-27 NOTE — Patient Instructions (Addendum)
  Your physician recommends that you schedule a follow-up appointment as needed with Dr Rayann Heman and  as scheduled with Hershal Coria    Your physician has requested that you have an echocardiogram. Echocardiography is a painless test that uses sound waves to create images of your heart. It provides your doctor with information about the size and shape of your heart and how well your heart's chambers and valves are working. This procedure takes approximately one hour. There are no restrictions for this procedure.---In [facility] office

## 2013-01-27 NOTE — Progress Notes (Signed)
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  She reports longstanding fatigue and dizziness.  Her dizziness is at times postural though she also reports frequent dizziness at rest.  She has significant fatigue and reports that her exercise tolerance has been reduced recently.  She was previously on atenolol however this had been discontinued without resolution of symptoms.  She subsequently had an event monitor placed.  She continues to wear this.  Her daughter presents with her today.  She is quite concerned that the patient is functionally very limited.  Today, she denies symptoms of palpitations, chest pain, shortness of breath, orthopnea, PND, lower extremity edema, presyncope, syncope, or neurologic sequela. The patient is tolerating medications without difficulties and is otherwise without complaint today.   Past Medical History  Diagnosis Date  . CAD (coronary artery disease)     CABG surgery in 06/2005 for left main disease; normal EF; negative stress nuclear 08/2008  . Cerebrovascular disease     1993-CVA; 08/2008-mild atherosclerosis without focal stenosis  . Syncope      Negative even recorder dated 3/08  . Tobacco abuse     60 pack years; continuing  . Orthostatic hypotension   . Diverticulosis   . Osteopenia   . GERD (gastroesophageal reflux disease)   . Asthma     as a child  . Hypertension   . Mental disorder     mild dementia  . Stroke     mild left sided weakness, peripheral vision loss  . UTI (urinary tract infection)   . Anemia   . Sinus bradycardia   . Frequent PVCs    Past Surgical History  Procedure Laterality Date  . Abdominal hysterectomy  1990  . Coronary artery bypass graft  2007    Left main disease-2007  . Ventral hernia repair    . Appendectomy    . Tonsillectomy    . Colonoscopy  10/10/2011   Procedure: COLONOSCOPY;  Surgeon: Rogene Houston, MD;  Location: [facility];  Service: Endoscopy;  Laterality: N/A;  1230  . Esophagogastroduodenoscopy  10/10/2011    Procedure: ESOPHAGOGASTRODUODENOSCOPY (EGD);  Surgeon: Rogene Houston, MD;  Location: [facility];  Service: Endoscopy;  Laterality: N/A;  . Cardiac surgery      Current Outpatient Prescriptions  Medication Sig Dispense Refill  . acetaminophen (TYLENOL) 325 MG tablet Take 650 mg by mouth every 6 (six) hours as needed. For pain      . alendronate (FOSAMAX) 70 MG tablet Take 70 mg by mouth every Saturday. Take with a full glass of water on an empty stomach.      Marland Kitchen aspirin 81 MG tablet Take 81 mg by mouth daily.        Marland Kitchen atorvastatin (LIPITOR) 10 MG tablet Take 10 mg by mouth at bedtime.      . Calcium-Vitamin D (CALTRATE 600 PLUS-VIT D PO) Take 2 tablets by mouth daily.       Marland Kitchen donepezil (ARICEPT) 10 MG tablet Take 10 mg by mouth at bedtime.       . gabapentin (NEURONTIN) 100 MG capsule Take 1 capsule (100 mg total) by mouth 3 (three) times daily.  90 capsule  5  . ibuprofen (ADVIL,MOTRIN) 200 MG tablet Take 200 mg by mouth every 6 (six) hours as needed.      . nitroGLYCERIN (  NITROSTAT) 0.4 MG SL tablet Place 1 tablet (0.4 mg total) under the tongue every 5 (five) minutes as needed for chest pain.  25 tablet  6  . Omega-3 Fatty Acids (FISH OIL) 1200 MG CAPS Take 1,200 mg by mouth every morning.       . vitamin B-12 (CYANOCOBALAMIN) 1000 MCG tablet Take 1,000 mcg by mouth every morning.       . vitamin C (ASCORBIC ACID) 500 MG tablet Take 500 mg by mouth every morning.       . [DISCONTINUED] METOPROLOL SUCCINATE ER PO Take by mouth.       No current facility-administered medications for this visit.    Allergies  Allergen Reactions  . Cephalexin Hives  . Contrast Media [Iodinated Diagnostic Agents]     Unknown  . Iohexol     Urticaria, emesis, numbness during IVP performed at [facility] years ago  .  Penicillins Hives  . Shellfish Allergy Other (See Comments)    Unknown  . Sulfa Antibiotics Hives    History   Social History  . Marital Status: Married    Spouse Name: N/A    Number of Children: N/A  . Years of Education: N/A   Occupational History  . retired    Social History Main Topics  . Smoking status: Former Smoker -- 1.00 packs/day for 60 years    Types: Cigarettes  . Smokeless tobacco: Never Used  . Alcohol Use: No  . Drug Use: No  . Sexual Activity: Not on file   Other Topics Concern  . Not on file   Social History Narrative  . No narrative on file    Family History  Problem Relation Age of Onset  . Heart disease    . Arthritis    . Lung disease    . Asthma      ROS- All systems are reviewed and negative except as per the HPI above  Physical Exam: Filed Vitals:   01/27/13 1432  BP: 150/82  Pulse: 76  Height: 5\' 4"  (1.626 m)  Weight: 123 lb 9.6 oz (56.065 kg)    GEN- The patient is elderly appearing, alert and oriented x 3 today.  Has difficulty with memory recall and providing history related to recent symptoms.  Her daughter frequently assists with the history Head- normocephalic, atraumatic Eyes-  Sclera clear, conjunctiva pink Ears- hearing intact Oropharynx- clear Neck- supple, no JVP Lymph- no cervical lymphadenopathy Lungs- Clear to ausculation bilaterally, normal work of breathing Heart- bradycardic rhythm with frequent ectopy GI- soft, NT, ND, + BS Extremities- no clubbing, cyanosis, + edema MS- no significant deformity or atrophy Skin- no rash or lesion Psych- a degree of memory impairment is evident Neuro- strength and sensation are intact  EKG 01/19/13 reveals sinus bradycardia with pvcs in bigeminy.  PVCs are LBB inferior axis morphology with transition at V3 Epic notes are reviewed Event monitor recordings thus far available reveal frequent sinus bradycardi with rates 40s.  Frequent PVCs are also noted  Assessment and  Plan:  1. Sick sinus syndrome/ symptomatic sinus bradycardia The patient is documented to have frequent sinus bradycardia on her event monitor.  This likely explains some of her dizziness and fatigue.  She is on no chronotropic agents at this time.   I would therefore recommend pacemaker implantation at this time.  Risks, benefits, alternatives to pacemaker implantation were discussed in detail with the patient today. The patient understands that the risks include but are not limited to  bleeding, infection, pneumothorax, perforation, tamponade, vascular damage, renal failure, MI, stroke, death,  and lead dislodgement and wishes to further consider this option.  She will alert my office if she decides to proceed.  Given concerns for contrast allergy (which she cannot recall), I would recommend premedication prior to the procedure if she decides to proceed.  2. PVCs Not closely coupled to the proceeding t wave.  These are outflow tract pvcs and likely benign.  I will order an echo to evaluate for structural changes related to her PVCs.  Given sinus bradycardia (as above), I cannot prescribe rate controlling medicine for her pvcs presently.  If she decides to pursue PPM, we would then have more medicine options for her PVCs.  Given her advanced age, I think that we should avoid ablation for her PVCs  3. CAD Stable No change required today Echo  4. HTN Stable No change required today  She will follow with Dr Woodroe Chen I will see her as needed unless she decides to proceed with PPM

## 2013-02-02 ENCOUNTER — Telehealth: Payer: Self-pay | Admitting: Internal Medicine

## 2013-02-02 ENCOUNTER — Ambulatory Visit (INDEPENDENT_AMBULATORY_CARE_PROVIDER_SITE_OTHER): Payer: Medicare Other | Admitting: Cardiovascular Disease

## 2013-02-02 ENCOUNTER — Encounter: Payer: Self-pay | Admitting: Cardiovascular Disease

## 2013-02-02 VITALS — BP 158/90 | HR 61 | Ht 64.5 in | Wt 122.0 lb

## 2013-02-02 DIAGNOSIS — I495 Sick sinus syndrome: Secondary | ICD-10-CM

## 2013-02-02 DIAGNOSIS — I493 Ventricular premature depolarization: Secondary | ICD-10-CM

## 2013-02-02 DIAGNOSIS — I10 Essential (primary) hypertension: Secondary | ICD-10-CM

## 2013-02-02 DIAGNOSIS — R42 Dizziness and giddiness: Secondary | ICD-10-CM

## 2013-02-02 DIAGNOSIS — I25810 Atherosclerosis of coronary artery bypass graft(s) without angina pectoris: Secondary | ICD-10-CM

## 2013-02-02 DIAGNOSIS — R001 Bradycardia, unspecified: Secondary | ICD-10-CM

## 2013-02-02 DIAGNOSIS — E785 Hyperlipidemia, unspecified: Secondary | ICD-10-CM

## 2013-02-02 DIAGNOSIS — I498 Other specified cardiac arrhythmias: Secondary | ICD-10-CM

## 2013-02-02 DIAGNOSIS — I4949 Other premature depolarization: Secondary | ICD-10-CM

## 2013-02-02 NOTE — Telephone Encounter (Signed)
New message  Pt daughter to schedule a pacemaker implantation. Please call back to assist

## 2013-02-02 NOTE — Progress Notes (Signed)
Patient ID: [name], female   DOB: [birth year], 80 y.o.   MRN: [id number]      SUBJECTIVE: This is an 80 year old woman who presents for routine cardiovascular followup. She was recently evaluated by Dr. Rayann Heman and pacemaker implantation was recommended for symptomatic sinus bradycardia/sick sinus syndrome. She is currently wearing an event monitor and has been noted to have heart rates in the 40 bpm range. She has also been experiencing outflow tract PVCs and due to her bradycardia, medical management is not possible. She has a history of coronary artery disease with a history of CABG, hypertension, hyperlipidemia, and CVA. Her most recent nuclear stress test was in November 2013 which showed no evidence of ischemia. As per Dr. Jackalyn Lombard note, the patient was to have an echocardiogram to evaluate for structural heart disease given her outflow tract PVCs, but this does not appear to have been done. Due to the aforementioned conditions, she has long-standing fatigue and dizziness. However, she denies chest pain, palpitations, shortness of breath, orthopnea, PND and leg swelling, and also denies syncope. She is here with her daughter, and they both have questions with regards to the benefit of a pacemaker and as to whether or not it would be beneficial to proceed. Her daughter also has several questions regarding vitamin and mineral supplements.    Allergies  Allergen Reactions  . Cephalexin Hives  . Contrast Media [Iodinated Diagnostic Agents]     Unknown  . Iohexol     Urticaria, emesis, numbness during IVP performed at [facility] years ago  . Penicillins Hives  . Shellfish Allergy Other (See Comments)    Unknown  . Sulfa Antibiotics Hives    Current Outpatient Prescriptions  Medication Sig Dispense Refill  . acetaminophen (TYLENOL) 325 MG tablet Take 650 mg by mouth every 6 (six) hours as needed. For pain      . alendronate (FOSAMAX) 70 MG tablet Take 70 mg by mouth every  Saturday. Take with a full glass of water on an empty stomach.      Marland Kitchen aspirin 81 MG tablet Take 81 mg by mouth daily.        Marland Kitchen atorvastatin (LIPITOR) 10 MG tablet Take 10 mg by mouth at bedtime.      . Calcium-Vitamin D (CALTRATE 600 PLUS-VIT D PO) Take 2 tablets by mouth daily.       . ciprofloxacin (CIPRO) 500 MG tablet Take 500 mg by mouth 2 (two) times daily.      Marland Kitchen donepezil (ARICEPT) 10 MG tablet Take 10 mg by mouth at bedtime.       . gabapentin (NEURONTIN) 100 MG capsule Take 1 capsule (100 mg total) by mouth 3 (three) times daily.  90 capsule  5  . ibuprofen (ADVIL,MOTRIN) 200 MG tablet Take 200 mg by mouth every 6 (six) hours as needed.      . nitroGLYCERIN (NITROSTAT) 0.4 MG SL tablet Place 1 tablet (0.4 mg total) under the tongue every 5 (five) minutes as needed for chest pain.  25 tablet  6  . Omega-3 Fatty Acids (FISH OIL) 1200 MG CAPS Take 1,200 mg by mouth every morning.       . vitamin B-12 (CYANOCOBALAMIN) 1000 MCG tablet Take 1,000 mcg by mouth every morning.       . vitamin C (ASCORBIC ACID) 500 MG tablet Take 500 mg by mouth every morning.       . [DISCONTINUED] METOPROLOL SUCCINATE ER PO Take by mouth.  No current facility-administered medications for this visit.    Past Medical History  Diagnosis Date  . CAD (coronary artery disease)     CABG surgery in 06/2005 for left main disease; normal EF; negative stress nuclear 08/2008  . Cerebrovascular disease     1993-CVA; 08/2008-mild atherosclerosis without focal stenosis  . Syncope      Negative even recorder dated 3/08  . Tobacco abuse     60 pack years; continuing  . Orthostatic hypotension   . Diverticulosis   . Osteopenia   . GERD (gastroesophageal reflux disease)   . Asthma     as a child  . Hypertension   . Mental disorder     mild dementia  . Stroke     mild left sided weakness, peripheral vision loss  . UTI (urinary tract infection)   . Anemia   . Sinus bradycardia   . Frequent PVCs     Past  Surgical History  Procedure Laterality Date  . Abdominal hysterectomy  1990  . Coronary artery bypass graft  2007    Left main disease-2007  . Ventral hernia repair    . Appendectomy    . Tonsillectomy    . Colonoscopy  10/10/2011    Procedure: COLONOSCOPY;  Surgeon: Rogene Houston, MD;  Location: [facility];  Service: Endoscopy;  Laterality: N/A;  1230  . Esophagogastroduodenoscopy  10/10/2011    Procedure: ESOPHAGOGASTRODUODENOSCOPY (EGD);  Surgeon: Rogene Houston, MD;  Location: [facility];  Service: Endoscopy;  Laterality: N/A;  . Cardiac surgery      History   Social History  . Marital Status: Married    Spouse Name: N/A    Number of Children: N/A  . Years of Education: N/A   Occupational History  . retired    Social History Main Topics  . Smoking status: Former Smoker -- 1.00 packs/day for 60 years    Types: Cigarettes  . Smokeless tobacco: Never Used  . Alcohol Use: No  . Drug Use: No  . Sexual Activity: Not on file   Other Topics Concern  . Not on file   Social History Narrative  . No narrative on file     Filed Vitals:   02/02/13 1106  BP: 158/90  Pulse: 61  Height: 5' 4.5" (1.638 m)  Weight: 122 lb (55.339 kg)    PHYSICAL EXAM General: NAD Neck: No JVD, no thyromegaly or thyroid nodule.  Lungs: Clear to auscultation bilaterally with normal respiratory effort. CV: Nondisplaced PMI.  Heart regular rhythm with HR 60 bpm, brief pauses up to 1.5 seconds, normal S1/S2, no S3/S4, no murmur.  No peripheral edema.  No carotid bruit.  Normal pedal pulses.  Abdomen: Soft, nontender, no hepatosplenomegaly, no distention.  Neurologic: Alert and oriented x 3.  Psych: Normal affect. Extremities: No clubbing or cyanosis.   ECG: reviewed and available in electronic records.      ASSESSMENT AND PLAN: 1. Sick sinus syndrome/symptomatic bradycardia: I am in agreement with Dr. Rayann Heman that pacemaker placement would be beneficial. I explained this to both  the patient and her daughter, given her symptoms of fatigue and dizziness. 2. CAD s/p CABG: symptomatically stable. No changes in medications required today. Continue ASA and Lipitor. 3. HTN: her home BP monitor is malfunctioning and in need of battery replacement. It is elevated in the office. I will wait until PPM implantation before initiating antihypertensive therapy. The patient and daughter will monitor BP at home and inform me of  these values. 4. Hyperlipidemia: on Lipitor and fish oil.  Dispo: f/u 6 months.  Kate Sable, M.D., F.A.C.C.

## 2013-02-02 NOTE — Telephone Encounter (Signed)
Will call on Thurs to schedule

## 2013-02-02 NOTE — Patient Instructions (Signed)
Your physician wants you to follow-up in: 6 months  You will receive a reminder letter in the mail two months in advance. If you don't receive a letter, please call our office to schedule the follow-up appointment.    Your physician has requested that you have an echocardiogram. Echocardiography is a painless test that uses sound waves to create images of your heart. It provides your doctor with information about the size and shape of your heart and how well your heart's chambers and valves are working. This procedure takes approximately one hour. There are no restrictions for this procedure.  Your physician recommends that you continue on your current medications as directed. Please refer to the Current Medication list given to you today.

## 2013-02-04 ENCOUNTER — Ambulatory Visit (HOSPITAL_COMMUNITY)
Admission: RE | Admit: 2013-02-04 | Discharge: 2013-02-04 | Disposition: A | Discharge status: Home / Self Care | Payer: Medicare Other | Source: Ambulatory Visit | Attending: Internal Medicine | Admitting: Internal Medicine

## 2013-02-04 DIAGNOSIS — K219 Gastro-esophageal reflux disease without esophagitis: Secondary | ICD-10-CM | POA: Insufficient documentation

## 2013-02-04 DIAGNOSIS — I4949 Other premature depolarization: Secondary | ICD-10-CM | POA: Insufficient documentation

## 2013-02-04 DIAGNOSIS — I369 Nonrheumatic tricuspid valve disorder, unspecified: Secondary | ICD-10-CM

## 2013-02-04 DIAGNOSIS — I2510 Atherosclerotic heart disease of native coronary artery without angina pectoris: Secondary | ICD-10-CM | POA: Insufficient documentation

## 2013-02-04 DIAGNOSIS — F0390 Unspecified dementia without behavioral disturbance: Secondary | ICD-10-CM | POA: Insufficient documentation

## 2013-02-04 DIAGNOSIS — I498 Other specified cardiac arrhythmias: Secondary | ICD-10-CM | POA: Insufficient documentation

## 2013-02-04 NOTE — Progress Notes (Signed)
*  PRELIMINARY RESULTS* Echocardiogram 2D Echocardiogram has been performed.  [place], [place] 02/04/2013, 2:52 PM

## 2013-02-04 NOTE — Telephone Encounter (Signed)
Spoke with daughter and patient is scheduled for PPM Implant on 02/08/13.  To be at the hospital at [place] after MN

## 2013-02-05 ENCOUNTER — Other Ambulatory Visit: Payer: Self-pay | Admitting: *Deleted

## 2013-02-05 DIAGNOSIS — I495 Sick sinus syndrome: Secondary | ICD-10-CM

## 2013-02-08 ENCOUNTER — Other Ambulatory Visit: Payer: Self-pay | Admitting: *Deleted

## 2013-02-08 ENCOUNTER — Encounter (HOSPITAL_COMMUNITY): Payer: Self-pay | Admitting: Pharmacy Technician

## 2013-02-08 ENCOUNTER — Telehealth: Payer: Self-pay | Admitting: *Deleted

## 2013-02-08 MED ORDER — SODIUM CHLORIDE 0.9 % IR SOLN
80.0000 mg | Status: DC
  Filled 2013-02-08: qty 2

## 2013-02-08 MED ORDER — VANCOMYCIN HCL IN DEXTROSE 1-5 GM/200ML-% IV SOLN
1000.0000 mg | INTRAVENOUS | Status: DC
  Filled 2013-02-08: qty 200

## 2013-02-08 MED ORDER — PREDNISONE 20 MG PO TABS: ORAL_TABLET | ORAL | Status: DC

## 2013-02-08 MED ORDER — SODIUM CHLORIDE 0.9 % IV SOLN
INTRAVENOUS | Status: DC
  Administered 2013-02-09: 09:00:00 via INTRAVENOUS

## 2013-02-08 NOTE — Telephone Encounter (Signed)
Spoke with patient's daughter and let her know to take Prednisone 20mg   3 tablets tonight with 20mg  of Pepcid and 3 tablets of Prednisone in the morning prior to procedure.  I sent in the Prednisone to her Pharmacy

## 2013-02-09 ENCOUNTER — Encounter (HOSPITAL_COMMUNITY): Payer: Self-pay | Admitting: *Deleted

## 2013-02-09 ENCOUNTER — Encounter (HOSPITAL_COMMUNITY)
Admission: RE | Disposition: A | Discharge status: Home / Self Care | Payer: Medicare Other | Source: Ambulatory Visit | Attending: Internal Medicine

## 2013-02-09 ENCOUNTER — Ambulatory Visit (HOSPITAL_COMMUNITY)
Admission: RE | Admit: 2013-02-09 | Discharge: 2013-02-10 | Disposition: A | Discharge status: Home / Self Care | Payer: Medicare Other | Source: Ambulatory Visit | Attending: Internal Medicine | Admitting: Internal Medicine

## 2013-02-09 DIAGNOSIS — D649 Anemia, unspecified: Secondary | ICD-10-CM

## 2013-02-09 DIAGNOSIS — I495 Sick sinus syndrome: Secondary | ICD-10-CM | POA: Diagnosis present

## 2013-02-09 DIAGNOSIS — M899 Disorder of bone, unspecified: Secondary | ICD-10-CM | POA: Insufficient documentation

## 2013-02-09 DIAGNOSIS — Z87891 Personal history of nicotine dependence: Secondary | ICD-10-CM | POA: Insufficient documentation

## 2013-02-09 DIAGNOSIS — I2510 Atherosclerotic heart disease of native coronary artery without angina pectoris: Secondary | ICD-10-CM | POA: Insufficient documentation

## 2013-02-09 DIAGNOSIS — I10 Essential (primary) hypertension: Secondary | ICD-10-CM | POA: Insufficient documentation

## 2013-02-09 DIAGNOSIS — E876 Hypokalemia: Secondary | ICD-10-CM | POA: Insufficient documentation

## 2013-02-09 DIAGNOSIS — Z7982 Long term (current) use of aspirin: Secondary | ICD-10-CM | POA: Insufficient documentation

## 2013-02-09 DIAGNOSIS — F0390 Unspecified dementia without behavioral disturbance: Secondary | ICD-10-CM | POA: Insufficient documentation

## 2013-02-09 DIAGNOSIS — I951 Orthostatic hypotension: Secondary | ICD-10-CM | POA: Insufficient documentation

## 2013-02-09 DIAGNOSIS — Z8673 Personal history of transient ischemic attack (TIA), and cerebral infarction without residual deficits: Secondary | ICD-10-CM | POA: Insufficient documentation

## 2013-02-09 DIAGNOSIS — M949 Disorder of cartilage, unspecified: Secondary | ICD-10-CM

## 2013-02-09 DIAGNOSIS — K219 Gastro-esophageal reflux disease without esophagitis: Secondary | ICD-10-CM | POA: Insufficient documentation

## 2013-02-09 DIAGNOSIS — Z951 Presence of aortocoronary bypass graft: Secondary | ICD-10-CM | POA: Insufficient documentation

## 2013-02-09 DIAGNOSIS — I4949 Other premature depolarization: Secondary | ICD-10-CM | POA: Insufficient documentation

## 2013-02-09 DIAGNOSIS — R55 Syncope and collapse: Secondary | ICD-10-CM | POA: Diagnosis present

## 2013-02-09 HISTORY — DX: Other chronic pain: G89.29

## 2013-02-09 HISTORY — DX: Unspecified osteoarthritis, unspecified site: M19.90

## 2013-02-09 HISTORY — DX: Unspecified chronic bronchitis: J42

## 2013-02-09 HISTORY — PX: PERMANENT PACEMAKER INSERTION: SHX5480

## 2013-02-09 HISTORY — DX: Unspecified dementia, unspecified severity, without behavioral disturbance, psychotic disturbance, mood disturbance, and anxiety: F03.90

## 2013-02-09 HISTORY — DX: Pure hypercholesterolemia, unspecified: E78.00

## 2013-02-09 HISTORY — DX: Family history of other specified conditions: Z84.89

## 2013-02-09 HISTORY — DX: Dorsalgia, unspecified: M54.9

## 2013-02-09 HISTORY — DX: Pneumonia, unspecified organism: J18.9

## 2013-02-09 HISTORY — DX: Headache: R51

## 2013-02-09 HISTORY — PX: PACEMAKER INSERTION: SHX728

## 2013-02-09 HISTORY — DX: Urinary tract infection, site not specified: N39.0

## 2013-02-09 LAB — SURGICAL PCR SCREEN
MRSA, PCR: NEGATIVE
STAPHYLOCOCCUS AUREUS: NEGATIVE

## 2013-02-09 LAB — CBC
HCT: 28.2 % — ABNORMAL LOW (ref 36.0–46.0)
Hemoglobin: 8.8 g/dL — ABNORMAL LOW (ref 12.0–15.0)
MCH: 26.2 pg (ref 26.0–34.0)
MCHC: 31.2 g/dL (ref 30.0–36.0)
MCV: 83.9 fL (ref 78.0–100.0)
Platelets: 278 10*3/uL (ref 150–400)
RBC: 3.36 MIL/uL — AB (ref 3.87–5.11)
RDW: 16 % — AB (ref 11.5–15.5)
WBC: 5.8 10*3/uL (ref 4.0–10.5)

## 2013-02-09 LAB — BASIC METABOLIC PANEL
BUN: 16 mg/dL (ref 6–23)
CHLORIDE: 105 meq/L (ref 96–112)
CO2: 25 meq/L (ref 19–32)
CREATININE: 0.64 mg/dL (ref 0.50–1.10)
Calcium: 8.6 mg/dL (ref 8.4–10.5)
GFR calc non Af Amer: 79 mL/min — ABNORMAL LOW (ref >90)
Glucose, Bld: 119 mg/dL — ABNORMAL HIGH (ref 70–99)
POTASSIUM: 3.2 meq/L — AB (ref 3.7–5.3)
Sodium: 144 mEq/L (ref 137–147)

## 2013-02-09 LAB — RETICULOCYTES
RBC.: 3.43 MIL/uL — AB (ref 3.87–5.11)
RETIC COUNT ABSOLUTE: 82.3 10*3/uL (ref 19.0–186.0)
Retic Ct Pct: 2.4 % (ref 0.4–3.1)

## 2013-02-09 LAB — PROTIME-INR
INR: 1.09 (ref 0.00–1.49)
Prothrombin Time: 13.9 seconds (ref 11.6–15.2)

## 2013-02-09 LAB — IRON AND TIBC: UIBC: 464 ug/dL — ABNORMAL HIGH (ref 125–400)

## 2013-02-09 SURGERY — PERMANENT PACEMAKER INSERTION
Anesthesia: LOCAL

## 2013-02-09 MED ORDER — DIPHENHYDRAMINE HCL 50 MG/ML IJ SOLN
25.0000 mg | INTRAMUSCULAR | Status: AC
  Administered 2013-02-09: 25 mg via INTRAVENOUS

## 2013-02-09 MED ORDER — SODIUM CHLORIDE 0.9 % IJ SOLN: 3.0000 mL | Freq: Two times a day (BID) | INTRAMUSCULAR | Status: DC

## 2013-02-09 MED ORDER — DONEPEZIL HCL 10 MG PO TABS
10.0000 mg | ORAL_TABLET | Freq: Every day | ORAL | Status: DC
  Administered 2013-02-10: 10:00:00 10 mg via ORAL
  Filled 2013-02-09: qty 1

## 2013-02-09 MED ORDER — LIDOCAINE HCL (PF) 1 % IJ SOLN
INTRAMUSCULAR | Status: AC
  Filled 2013-02-09: qty 60

## 2013-02-09 MED ORDER — FENTANYL CITRATE 0.05 MG/ML IJ SOLN
INTRAMUSCULAR | Status: AC
  Filled 2013-02-09: qty 2

## 2013-02-09 MED ORDER — HYDROCODONE-ACETAMINOPHEN 5-325 MG PO TABS
1.0000 | ORAL_TABLET | ORAL | Status: DC | PRN
Start: 2013-02-09 — End: 2013-02-10

## 2013-02-09 MED ORDER — ACETAMINOPHEN 325 MG PO TABS
325.0000 mg | ORAL_TABLET | ORAL | Status: DC | PRN
  Administered 2013-02-09 – 2013-02-10 (×3): 650 mg via ORAL
  Filled 2013-02-09 (×3): qty 2

## 2013-02-09 MED ORDER — DIPHENHYDRAMINE HCL 50 MG/ML IJ SOLN
INTRAMUSCULAR | Status: AC
  Filled 2013-02-09: qty 1

## 2013-02-09 MED ORDER — MUPIROCIN 2 % EX OINT
TOPICAL_OINTMENT | CUTANEOUS | Status: AC
  Administered 2013-02-09: 09:00:00
  Filled 2013-02-09: qty 22

## 2013-02-09 MED ORDER — VANCOMYCIN HCL IN DEXTROSE 1-5 GM/200ML-% IV SOLN
1000.0000 mg | Freq: Two times a day (BID) | INTRAVENOUS | Status: AC
  Administered 2013-02-09: 1000 mg via INTRAVENOUS
  Filled 2013-02-09: qty 200

## 2013-02-09 MED ORDER — MIDAZOLAM HCL 5 MG/5ML IJ SOLN
INTRAMUSCULAR | Status: AC
  Filled 2013-02-09: qty 5

## 2013-02-09 MED ORDER — GABAPENTIN 100 MG PO CAPS
100.0000 mg | ORAL_CAPSULE | Freq: Three times a day (TID) | ORAL | Status: DC
  Administered 2013-02-09 – 2013-02-10 (×3): 100 mg via ORAL
  Filled 2013-02-09 (×6): qty 1

## 2013-02-09 MED ORDER — YOU HAVE A PACEMAKER BOOK
Freq: Once | Status: AC
  Administered 2013-02-10: 07:00:00
  Filled 2013-02-09: qty 1

## 2013-02-09 MED ORDER — SODIUM CHLORIDE 0.9 % IJ SOLN: 3.0000 mL | INTRAMUSCULAR | Status: DC | PRN

## 2013-02-09 MED ORDER — ONDANSETRON HCL 4 MG/2ML IJ SOLN: 4.0000 mg | Freq: Four times a day (QID) | INTRAMUSCULAR | Status: DC | PRN

## 2013-02-09 MED ORDER — SODIUM CHLORIDE 0.9 % IV SOLN: 250.0000 mL | INTRAVENOUS | Status: DC | PRN

## 2013-02-09 NOTE — Interval H&P Note (Signed)
History and Physical Interval Note:  02/09/2013 10:22 AM   In addition to the above, for about 3 weeks she has noticed dark tarry stools.  She denies symptoms of anemia but is anemic at this time (new finding). I will order anemia labs and she will need an outpatient workup if she remains stable while here.  [name]  has presented today for surgery, with the diagnosis of Complete heart block  The various methods of treatment have been discussed with the patient and family. After consideration of risks, benefits and other options for treatment, the patient has consented to  Procedure(s): PERMANENT PACEMAKER INSERTION (N/A) as a surgical intervention .  The patient's history has been reviewed, patient examined, no change in status, stable for surgery.  I have reviewed the patient's chart and labs.  Questions were answered to the patient's satisfaction.     Thompson Grayer

## 2013-02-09 NOTE — Progress Notes (Signed)
Retail banker and Dr. Thompson Grayer into see patient and interrogate pacemaker. Notified nursing staff that the spikes noted on telemetry were artifact and discussed information with patient and family. Patient remains comfortable with no complaints at this time.

## 2013-02-09 NOTE — H&P (View-Only) (Signed)
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  She reports longstanding fatigue and dizziness.  Her dizziness is at times postural though she also reports frequent dizziness at rest.  She has significant fatigue and reports that her exercise tolerance has been reduced recently.  She was previously on atenolol however this had been discontinued without resolution of symptoms.  She subsequently had an event monitor placed.  She continues to wear this.  Her daughter presents with her today.  She is quite concerned that the patient is functionally very limited.  Today, she denies symptoms of palpitations, chest pain, shortness of breath, orthopnea, PND, lower extremity edema, presyncope, syncope, or neurologic sequela. The patient is tolerating medications without difficulties and is otherwise without complaint today.   Past Medical History  Diagnosis Date  . CAD (coronary artery disease)     CABG surgery in 06/2005 for left main disease; normal EF; negative stress nuclear 08/2008  . Cerebrovascular disease     1993-CVA; 08/2008-mild atherosclerosis without focal stenosis  . Syncope      Negative even recorder dated 3/08  . Tobacco abuse     60 pack years; continuing  . Orthostatic hypotension   . Diverticulosis   . Osteopenia   . GERD (gastroesophageal reflux disease)   . Asthma     as a child  . Hypertension   . Mental disorder     mild dementia  . Stroke     mild left sided weakness, peripheral vision loss  . UTI (urinary tract infection)   . Anemia   . Sinus bradycardia   . Frequent PVCs    Past Surgical History  Procedure Laterality Date  . Abdominal hysterectomy  1990  . Coronary artery bypass graft  2007    Left main disease-2007  . Ventral hernia repair    . Appendectomy    . Tonsillectomy    . Colonoscopy  10/10/2011   Procedure: COLONOSCOPY;  Surgeon: Rogene Houston, MD;  [facility]: [facility];  Service: Endoscopy;  Laterality: N/A;  1230  . Esophagogastroduodenoscopy  10/10/2011    Procedure: ESOPHAGOGASTRODUODENOSCOPY (EGD);  Surgeon: Rogene Houston, MD;  [facility]: [facility];  Service: Endoscopy;  Laterality: N/A;  . Cardiac surgery      Current Outpatient Prescriptions  Medication Sig Dispense Refill  . acetaminophen (TYLENOL) 325 MG tablet Take 650 mg by mouth every 6 (six) hours as needed. For pain      . alendronate (FOSAMAX) 70 MG tablet Take 70 mg by mouth every Saturday. Take with a full glass of water on an empty stomach.      Marland Kitchen aspirin 81 MG tablet Take 81 mg by mouth daily.        Marland Kitchen atorvastatin (LIPITOR) 10 MG tablet Take 10 mg by mouth at bedtime.      . Calcium-Vitamin D (CALTRATE 600 PLUS-VIT D PO) Take 2 tablets by mouth daily.       Marland Kitchen donepezil (ARICEPT) 10 MG tablet Take 10 mg by mouth at bedtime.       . gabapentin (NEURONTIN) 100 MG capsule Take 1 capsule (100 mg total) by mouth 3 (three) times daily.  90 capsule  5  . ibuprofen (ADVIL,MOTRIN) 200 MG tablet Take 200 mg by mouth every 6 (six) hours as needed.      . nitroGLYCERIN (  NITROSTAT) 0.4 MG SL tablet Place 1 tablet (0.4 mg total) under the tongue every 5 (five) minutes as needed for chest pain.  25 tablet  6  . Omega-3 Fatty Acids (FISH OIL) 1200 MG CAPS Take 1,200 mg by mouth every morning.       . vitamin B-12 (CYANOCOBALAMIN) 1000 MCG tablet Take 1,000 mcg by mouth every morning.       . vitamin C (ASCORBIC ACID) 500 MG tablet Take 500 mg by mouth every morning.       . [DISCONTINUED] METOPROLOL SUCCINATE ER PO Take by mouth.       No current facility-administered medications for this visit.    Allergies  Allergen Reactions  . Cephalexin Hives  . Contrast Media [Iodinated Diagnostic Agents]     Unknown  . Iohexol     Urticaria, emesis, numbness during IVP performed at [facility] years ago  .  Penicillins Hives  . Shellfish Allergy Other (See Comments)    Unknown  . Sulfa Antibiotics Hives    History   Social History  . Marital Status: Married    Spouse Name: N/A    Number of Children: N/A  . Years of Education: N/A   Occupational History  . retired    Social History Main Topics  . Smoking status: Former Smoker -- 1.00 packs/day for 60 years    Types: Cigarettes  . Smokeless tobacco: Never Used  . Alcohol Use: No  . Drug Use: No  . Sexual Activity: Not on file   Other Topics Concern  . Not on file   Social History Narrative  . No narrative on file    Family History  Problem Relation Age of Onset  . Heart disease    . Arthritis    . Lung disease    . Asthma      ROS- All systems are reviewed and negative except as per the HPI above  Physical Exam: Filed Vitals:   01/27/13 1432  BP: 150/82  Pulse: 76  Height: 5\' 4"  (1.626 m)  Weight: 123 lb 9.6 oz (56.065 kg)    GEN- The patient is elderly appearing, alert and oriented x 3 today.  Has difficulty with memory recall and providing history related to recent symptoms.  Her daughter frequently assists with the history Head- normocephalic, atraumatic Eyes-  Sclera clear, conjunctiva pink Ears- hearing intact Oropharynx- clear Neck- supple, no JVP Lymph- no cervical lymphadenopathy Lungs- Clear to ausculation bilaterally, normal work of breathing Heart- bradycardic rhythm with frequent ectopy GI- soft, NT, ND, + BS Extremities- no clubbing, cyanosis, + edema MS- no significant deformity or atrophy Skin- no rash or lesion Psych- a degree of memory impairment is evident Neuro- strength and sensation are intact  EKG 01/19/13 reveals sinus bradycardia with pvcs in bigeminy.  PVCs are LBB inferior axis morphology with transition at V3 Epic notes are reviewed Event monitor recordings thus far available reveal frequent sinus bradycardi with rates 40s.  Frequent PVCs are also noted  Assessment and  Plan:  1. Sick sinus syndrome/ symptomatic sinus bradycardia The patient is documented to have frequent sinus bradycardia on her event monitor.  This likely explains some of her dizziness and fatigue.  She is on no chronotropic agents at this time.   I would therefore recommend pacemaker implantation at this time.  Risks, benefits, alternatives to pacemaker implantation were discussed in detail with the patient today. The patient understands that the risks include but are not limited to  bleeding, infection, pneumothorax, perforation, tamponade, vascular damage, renal failure, MI, stroke, death,  and lead dislodgement and wishes to further consider this option.  She will alert my office if she decides to proceed.  Given concerns for contrast allergy (which she cannot recall), I would recommend premedication prior to the procedure if she decides to proceed.  2. PVCs Not closely coupled to the proceeding t wave.  These are outflow tract pvcs and likely benign.  I will order an echo to evaluate for structural changes related to her PVCs.  Given sinus bradycardia (as above), I cannot prescribe rate controlling medicine for her pvcs presently.  If she decides to pursue PPM, we would then have more medicine options for her PVCs.  Given her advanced age, I think that we should avoid ablation for her PVCs  3. CAD Stable No change required today Echo  4. HTN Stable No change required today  She will follow with Dr Woodroe Chen I will see her as needed unless she decides to proceed with PPM

## 2013-02-09 NOTE — Discharge Summary (Signed)
ELECTROPHYSIOLOGY PROCEDURE DISCHARGE SUMMARY    Patient ID: [name],  MRN: [id number], DOB/AGE: 1925-03-28 80 y.o.  Admit date: 02/09/2013 Discharge date: 02/10/2013  Primary Care Physician: Hilma Favors, MD Primary Cardiologist: Bronson Ing, MD Electrophysiologist: Rayann Heman, MD  Primary Discharge Diagnoses:  1. Symptomatic bradycardia s/p pacemaker implantation 2. Hypokalemia 3. Anemia  Secondary Discharge Diagnoses:  1.  CAD s/p CABG 2.  CVA 3.  Orthostatic hypotension 4.  Hypertension 5.  Frequent PVCs 6.  GERD 7.  Dementia  Procedures This Admission:  1.  Implantation of a permanent pacemaker on 02-09-2013 by Dr Rayann Heman.  The patient received a STJ Assurity pacemaker with model number 2088 right atrial lead and a model number 1948 right ventricular lead. There were no early apparent complications. 2.  CXR on 02-10-2013 demonstrated stable lead placement without pneumothorax.  Brief HPI: [name] is a 80 y.o. female with a past medical history as outlined above.  She was found to have symptomatic bradycardia and was referred to Dr. Rayann Heman in the outpatient setting for evaluation of permanent pacemaker.  Risks, benefits, and alternatives were reviewed with the patient who wished to proceed.   Hospital Course:  The patient was admitted and underwent implantation of a STJ dual chamber pacemaker with details as outlined above.  She was monitored on telemetry overnight which demonstrated SR with intermittent atrial pacing and frequent PVCs.  Left chest implant site was without hematoma or ecchymosis.  The device was interrogated and found to be functioning normally.  CXR was obtained and demonstrated no pneumothorax status post device implantation.  Wound care, arm mobility, and restrictions were reviewed with the patient and her daughter.  She was hypokalemic with potassium of 2.9.  She was given potassium here and started on a daily supplement.  She will have a repeat BMET at  her wound check visit.  Dr Thompson Grayer examined the patient and considered them stable for discharge to home today.  Of note, the patient was found to be anemic this admission and underwent iron studies which demonstrated iron <10 and ferritin 10.  Aspirin and ibuprofen were discontinued.  Iron supplementation and Colace were started. She has been advised to follow up with her primary care physician for further evaluation within one week.   Discharge Vitals: Blood pressure 183/72, pulse 64, temperature 97.6 F (36.4 C), temperature source Oral, resp. rate 20, height 5\' 4"  (1.626 m), weight 121 lb 11.1 oz (55.2 kg), SpO2 98.00%.   Labs: Lab Results  Component Value Date   WBC 10.9* 02/10/2013   HGB 8.4* 02/10/2013   HCT 27.2* 02/10/2013   MCV 84.0 02/10/2013   PLT 272 02/10/2013     Recent Labs Lab 02/10/13 0610  NA 144  K 2.9*  CL 105  CO2 26  BUN 14  CREATININE 0.68  CALCIUM 8.5  GLUCOSE 88    Discharge Medications:    Medication List    STOP taking these medications       aspirin 81 MG tablet     ibuprofen 200 MG tablet  Commonly known as:  ADVIL,MOTRIN     predniSONE 20 MG tablet  Commonly known as:  DELTASONE      TAKE these medications       acetaminophen 325 MG tablet  Commonly known as:  TYLENOL  Take 650 mg by mouth every 6 (six) hours as needed for mild pain. For pain     alendronate 70 MG tablet  Commonly known as:  FOSAMAX  Take 70 mg by mouth once a week. Take with a full glass of water on an empty stomach.     atorvastatin 10 MG tablet  Commonly known as:  LIPITOR  Take 10 mg by mouth daily.     CALTRATE 600 PLUS-VIT D PO  Take 2 tablets by mouth daily.     docusate sodium 100 MG capsule  Commonly known as:  COLACE  Take 1 capsule (100 mg total) by mouth 2 (two) times daily.     donepezil 10 MG tablet  Commonly known as:  ARICEPT  Take 10 mg by mouth daily.     ferrous sulfate 325 (65 FE) MG tablet  Take 1 tablet (325 mg total) by mouth daily  with breakfast.     Fish Oil 1200 MG Caps  Take 1,200 mg by mouth 2 (two) times daily.     gabapentin 100 MG capsule  Commonly known as:  NEURONTIN  Take 1 capsule (100 mg total) by mouth 3 (three) times daily.     nitroGLYCERIN 0.4 MG SL tablet  Commonly known as:  NITROSTAT  Place 1 tablet (0.4 mg total) under the tongue every 5 (five) minutes as needed for chest pain.     potassium chloride 10 MEQ tablet  Commonly known as:  K-DUR  Take 2 tablets (20 mEq total) by mouth daily.     vitamin B-12 1000 MCG tablet  Commonly known as:  CYANOCOBALAMIN  Take 1,000 mcg by mouth daily.     vitamin C 500 MG tablet  Commonly known as:  ASCORBIC ACID  Take 500 mg by mouth daily.        Disposition:  Stable   Follow-up Appointments: Follow-up Information   Follow up with [facility] On 02/18/2013. (At 4:30 PM for wound check)    Specialty:  Cardiology   Contact information:   [address] [place] [address] [phone]      Follow up with [facility] On 06/04/2013. (At 10:15 AM for pacemaker f/u with Dr. Rayann Heman)    Specialty:  Cardiology   Contact information:   [address] [place] [address] [phone]      Follow up with Purvis Kilts, MD. Schedule an appointment as soon as possible for a visit in 1 week. (Please call to schedule appointment within one week for evaluation of anemia)    Specialty:  Family Medicine   Contact information:   [address] [place] [address] [phone]      Duration of Discharge Encounter: Greater than 30 minutes including physician time.  Manson Passey, PA-C 02/10/2013 7:52 AM  Thompson Grayer MD

## 2013-02-09 NOTE — Op Note (Signed)
SURGEON:  Thompson Grayer, MD     PREPROCEDURE DIAGNOSIS:  Symptomatic Bradycardia/ sick sinus syndrome    POSTPROCEDURE DIAGNOSIS:  Symptomatic Bradycardia/ sick sinus syndrome     PROCEDURES:   1. Left upper extremity venography.   2. Pacemaker implantation.     INTRODUCTION:  [name] is a 80 y.o. female with a history of sinus bradycardia who presents today for pacemaker implantation.  The patient reports intermittent episodes of dizziness and fatigue over the past few months.  No reversible causes have been identified.  The patient therefore presents today for pacemaker implantation.     DESCRIPTION OF PROCEDURE:  Informed written consent was obtained, and  the patient was brought to the electrophysiology lab in a fasting state.  The patient required no sedation for the procedure today.  The patients left chest was prepped and draped in the usual sterile fashion by the EP lab staff. The skin overlying the left deltopectoral region was infiltrated with lidocaine for local analgesia.  A 4-cm incision was made over the left deltopectoral region.  A left subcutaneous pacemaker pocket was fashioned using a combination of sharp and blunt dissection. Electrocautery was required to assure hemostasis.    Left Upper Extremity Venography: A venogram of the left upper extremity was performed, which revealed a very small left cephalic vein, which emptied into a large left subclavian vein.  The left axillary vein was moderate in size.    RA/RV Lead Placement: The left axillary vein was therefore cannulated.  Through the left axillary vein, a [employer] model [phone]  (serial number  [id number]) right atrial lead and a [facility] model [id number] (serial number  [id number]) right ventricular lead were advanced with fluoroscopic visualization into the right atrial appendage and right ventricular apex positions respectively.  Initial atrial lead P- waves measured 2.3 mV with impedance of 525 ohms and  a threshold of 1.3 V at 0.4 msec.  Right ventricular lead R-waves measured 12.1 mV with an impedance of 940 ohms and a threshold of 0.4 V at 0.4 msec.  Both leads were secured to the pectoralis fascia using #2-0 silk over the suture sleeves.   Device Placement:  The leads were then connected to a [employer] DR  model [id number] (serial number  [id number] ) pacemaker.  The pocket was irrigated with copious gentamicin solution.  The pacemaker was then placed into the pocket.  The pocket was then closed in 2 layers with 2.0 Vicryl suture for the subcutaneous and subcuticular layers.  Steri-  Strips and a sterile dressing were then applied.  There were no early apparent complications.     CONCLUSIONS:   1. Successful implantation of a [employer] Ass[employer] DR  dual-chamber pacemaker for symptomatic sinus bradycardia  2. No early apparent complications.         Thompson Grayer, MD 02/09/2013 11:58 AM

## 2013-02-10 ENCOUNTER — Other Ambulatory Visit: Payer: Self-pay | Admitting: Cardiology

## 2013-02-10 ENCOUNTER — Ambulatory Visit (HOSPITAL_COMMUNITY): Payer: Medicare Other

## 2013-02-10 DIAGNOSIS — I495 Sick sinus syndrome: Secondary | ICD-10-CM

## 2013-02-10 DIAGNOSIS — E876 Hypokalemia: Secondary | ICD-10-CM

## 2013-02-10 LAB — BASIC METABOLIC PANEL
BUN: 14 mg/dL (ref 6–23)
CHLORIDE: 105 meq/L (ref 96–112)
CO2: 26 meq/L (ref 19–32)
Calcium: 8.5 mg/dL (ref 8.4–10.5)
Creatinine, Ser: 0.68 mg/dL (ref 0.50–1.10)
GFR calc non Af Amer: 78 mL/min — ABNORMAL LOW (ref >90)
GFR, EST AFRICAN AMERICAN: 90 mL/min — AB (ref >90)
Glucose, Bld: 88 mg/dL (ref 70–99)
Potassium: 2.9 mEq/L — CL (ref 3.7–5.3)
Sodium: 144 mEq/L (ref 137–147)

## 2013-02-10 LAB — VITAMIN B12: VITAMIN B 12: 1041 pg/mL — AB (ref 211–911)

## 2013-02-10 LAB — CBC
HEMATOCRIT: 27.2 % — AB (ref 36.0–46.0)
HEMOGLOBIN: 8.4 g/dL — AB (ref 12.0–15.0)
MCH: 25.9 pg — ABNORMAL LOW (ref 26.0–34.0)
MCHC: 30.9 g/dL (ref 30.0–36.0)
MCV: 84 fL (ref 78.0–100.0)
Platelets: 272 10*3/uL (ref 150–400)
RBC: 3.24 MIL/uL — AB (ref 3.87–5.11)
RDW: 16.3 % — ABNORMAL HIGH (ref 11.5–15.5)
WBC: 10.9 10*3/uL — AB (ref 4.0–10.5)

## 2013-02-10 LAB — FOLATE: FOLATE: 13 ng/mL

## 2013-02-10 LAB — FERRITIN: Ferritin: 10 ng/mL (ref 10–291)

## 2013-02-10 MED ORDER — POTASSIUM CHLORIDE CRYS ER 20 MEQ PO TBCR
40.0000 meq | EXTENDED_RELEASE_TABLET | Freq: Once | ORAL | Status: AC
  Administered 2013-02-10: 40 meq via ORAL
  Filled 2013-02-10: qty 2

## 2013-02-10 MED ORDER — POTASSIUM CHLORIDE ER 10 MEQ PO TBCR: 20.0000 meq | EXTENDED_RELEASE_TABLET | Freq: Every day | ORAL | Status: DC

## 2013-02-10 MED ORDER — DOCUSATE SODIUM 100 MG PO CAPS: 100.0000 mg | ORAL_CAPSULE | Freq: Two times a day (BID) | ORAL | Status: DC

## 2013-02-10 MED ORDER — FERROUS SULFATE 325 (65 FE) MG PO TABS: 325.0000 mg | ORAL_TABLET | Freq: Every day | ORAL | Status: DC

## 2013-02-10 NOTE — Progress Notes (Signed)
Patient continues to have irregular spikes on telemetry however stable and no complications. I notified MD on call Dr. Domingo Cocking made him aware of situation and discussed the previous findings after interrogated. The plan is to continue to monitor patient and I will call MD if any changes in patients condition and concerns. Daughter at bedside with patient staying the night.

## 2013-02-10 NOTE — Progress Notes (Signed)
Reviewed patients blood pressures over the last day with Dr. Rayann Heman and instructed to monitor at this time. Patient will be instructed to follow up with PCP regarding anemia and blood pressure as outpatient. Potassium will be replaced, orders received.

## 2013-02-10 NOTE — Discharge Instructions (Signed)
° °  Supplemental Discharge Instructions for  Pacemaker/Defibrillator Patients  Activity No heavy lifting or vigorous activity with your left/right arm for 6 to 8 weeks.  Do not raise your left/right arm above your head for one week.  Gradually raise your affected arm as drawn below.           02/06                      02/07                       02/08                      02/09       WOUND CARE   Keep the wound area clean and dry.  Do not get this area wet for one week. No showers for one week; you may shower on 02/18/2013.   The tape/steri-strips on your wound will fall off; do not pull them off.  No bandage is needed on the site.  DO  NOT apply any creams, oils, or ointments to the wound area.   If you notice any drainage or discharge from the wound, any swelling or bruising at the site, or you develop a fever > 101? F after you are discharged home, call the office at once.  Special Instructions   You are still able to use cellular telephones; use the ear opposite the side where you have your pacemaker/defibrillator.  Avoid carrying your cellular phone near your device.   When traveling through airports, show security personnel your identification card to avoid being screened in the metal detectors.  Ask the security personnel to use the hand wand.   Avoid arc welding equipment, MRI testing (magnetic resonance imaging), TENS units (transcutaneous nerve stimulators).  Call the office for questions about other devices.   Avoid electrical appliances that are in poor condition or are not properly grounded.   Microwave ovens are safe to be near or to operate.

## 2013-02-10 NOTE — Progress Notes (Signed)
CRITICAL VALUE ALERT  Critical value received:  K+2.9  Date of notification:  02/10/13  Time of notification:  0715  Critical value read back:yes  Nurse who received alert:  K.Shawnmichael Parenteau RN  MD notified (1st page):  Brooke P.A  Time of first page:  0715  MD notified (2nd page):  Time of second page:  Responding MD:  Jerene Pitch PA  Time MD responded:  [phone]  Dr.Allred will be here very soon to decide how to supplement the patient.

## 2013-02-10 NOTE — Progress Notes (Signed)
    Patient: [name] Date of Encounter: 02/10/2013, 7:17 AM Admit date: 02/09/2013     Subjective  [name] reports mild soreness at implant site but is otherwise without complaint. She denies CP or SOB.    Objective  Physical Exam: Vitals: BP 189/82  Pulse 67  Temp(Src) 97.8 F (36.6 C) (Oral)  Resp 20  Ht 5\' 4"  (1.626 m)  Wt 121 lb 11.1 oz (55.2 kg)  BMI 20.88 kg/m2  SpO2 98% General: Well developed, well appearing 80 year old female in no acute distress. Neck: Supple. JVD not elevated. Lungs: Clear bilaterally to auscultation without wheezes, rales, or rhonchi. Breathing is unlabored. Heart: RRR S1 S2 without murmurs, rubs, or gallops.  Abdomen: Soft, non-distended. Extremities: No clubbing or cyanosis. No edema.  Distal pedal pulses are 2+ and equal bilaterally. Neuro: Alert and oriented X 3. Moves all extremities spontaneously. No focal deficits. Skin: Left upper chest / implant site intact without bleeding or hematoma.   Intake/Output:  Intake/Output Summary (Last 24 hours) at 02/10/13 0717 Last data filed at 02/09/13 2009  Gross per 24 hour  Intake    480 ml  Output    900 ml  Net   -420 ml    Inpatient Medications:  . donepezil  10 mg Oral Daily  . gabapentin  100 mg Oral TID  . sodium chloride  3 mL Intravenous Q12H  . you have a pacemaker book   Does not apply Once    Labs:  Recent Labs  02/09/13 0855 02/10/13 0610  NA 144 144  K 3.2* 2.9*  CL 105 105  CO2 25 26  GLUCOSE 119* 88  BUN 16 14  CREATININE 0.64 0.68  CALCIUM 8.6 8.5    Recent Labs  02/09/13 0855 02/10/13 0610  WBC 5.8 10.9*  HGB 8.8* 8.4*  HCT 28.2* 27.2*  MCV 83.9 84.0  PLT 278 272    Recent Labs  02/09/13 1340  VITAMINB12 1041*  FOLATE 13.0  FERRITIN 10  TIBC Not calculated due to Iron <10.  IRON <10*  RETICCTPCT 2.4    Recent Labs  02/09/13 0855  INR 1.09    Radiology/Studies: CXR this AM shows stable lead placement; official report pending Device  interrogation pending Telemetry: A paced V sensed with frequent PVCs;    Assessment and Plan  1. Sinus bradycardia s/p PPM implant 2. Dementia 3. Anemia  [name] is doing well s/p PPM implant. Wound is intact without hematoma. CXR shows stable lead placement. Device interrogation pending. If device function is normal, will DC home this AM. Reviewed activity restrictions, wound care and follow-up with [name] and her daughter. Also will stop ASA and ibuprofen. Will start iron supplement with Colace and instructed her to follow-up with PCP within one week for anemia work-up.   Signed, EDMISTEN, BROOKE PA-C  ADDENDUM: BMET results 7:15 Am - potassium 2.9. Will replete and repeat BMET in 4 hours. If potassium improved, will DC home.  ADDENDUM: Per Dr. Rayann Heman, no need to repeat BMET today prior to DC. Will start potassium 20 mEq once daily and repeat BMET in one week.  Thompson Grayer MD

## 2013-02-12 ENCOUNTER — Encounter (HOSPITAL_COMMUNITY): Payer: Self-pay | Admitting: *Deleted

## 2013-02-18 ENCOUNTER — Other Ambulatory Visit (INDEPENDENT_AMBULATORY_CARE_PROVIDER_SITE_OTHER): Payer: Medicare Other

## 2013-02-18 ENCOUNTER — Ambulatory Visit (INDEPENDENT_AMBULATORY_CARE_PROVIDER_SITE_OTHER): Payer: Medicare Other | Admitting: *Deleted

## 2013-02-18 DIAGNOSIS — E876 Hypokalemia: Secondary | ICD-10-CM

## 2013-02-18 DIAGNOSIS — I495 Sick sinus syndrome: Secondary | ICD-10-CM

## 2013-02-18 LAB — MDC_IDC_ENUM_SESS_TYPE_INCLINIC
Battery Remaining Longevity: 127.2 mo
Brady Statistic RA Percent Paced: 34 %
Brady Statistic RV Percent Paced: 1 %
Date Time Interrogation Session: 20150212163423
Lead Channel Impedance Value: 425 Ohm
Lead Channel Pacing Threshold Amplitude: 0.75 V
Lead Channel Pacing Threshold Pulse Width: 0.4 ms
Lead Channel Pacing Threshold Pulse Width: 0.4 ms
Lead Channel Pacing Threshold Pulse Width: 0.4 ms
Lead Channel Sensing Intrinsic Amplitude: 1.5 mV
Lead Channel Setting Pacing Amplitude: 3.5 V
Lead Channel Setting Pacing Pulse Width: 0.4 ms
MDC IDC MSMT BATTERY VOLTAGE: 3.05 V
MDC IDC MSMT LEADCHNL RV IMPEDANCE VALUE: 662.5 Ohm
MDC IDC MSMT LEADCHNL RV PACING THRESHOLD AMPLITUDE: 0.5 V
MDC IDC MSMT LEADCHNL RV PACING THRESHOLD AMPLITUDE: 0.5 V
MDC IDC MSMT LEADCHNL RV SENSING INTR AMPL: 12 mV
MDC IDC PG SERIAL: 7586568
MDC IDC SET LEADCHNL RA PACING AMPLITUDE: 1.75 V
MDC IDC SET LEADCHNL RV SENSING SENSITIVITY: 2 mV

## 2013-02-18 NOTE — Progress Notes (Signed)

## 2013-02-19 LAB — BASIC METABOLIC PANEL
BUN: 13 mg/dL (ref 6–23)
CO2: 25 meq/L (ref 19–32)
Calcium: 9 mg/dL (ref 8.4–10.5)
Chloride: 110 mEq/L (ref 96–112)
Creatinine, Ser: 0.6 mg/dL (ref 0.4–1.2)
GFR: 104.97 mL/min (ref >60.00)
Glucose, Bld: 109 mg/dL — ABNORMAL HIGH (ref 70–99)
POTASSIUM: 3.7 meq/L (ref 3.5–5.1)
SODIUM: 141 meq/L (ref 135–145)

## 2013-02-20 ENCOUNTER — Other Ambulatory Visit: Payer: Self-pay | Admitting: Cardiology

## 2013-02-26 ENCOUNTER — Encounter: Payer: Self-pay | Admitting: Internal Medicine

## 2013-04-30 ENCOUNTER — Encounter: Payer: Self-pay | Admitting: *Deleted

## 2013-05-03 ENCOUNTER — Ambulatory Visit (INDEPENDENT_AMBULATORY_CARE_PROVIDER_SITE_OTHER): Payer: Medicare Other | Admitting: Neurology

## 2013-05-03 ENCOUNTER — Encounter: Payer: Self-pay | Admitting: Neurology

## 2013-05-03 ENCOUNTER — Encounter (INDEPENDENT_AMBULATORY_CARE_PROVIDER_SITE_OTHER): Payer: Self-pay

## 2013-05-03 VITALS — BP 119/87 | HR 62 | Ht 64.0 in | Wt 119.0 lb

## 2013-05-03 DIAGNOSIS — I951 Orthostatic hypotension: Secondary | ICD-10-CM

## 2013-05-03 DIAGNOSIS — R269 Unspecified abnormalities of gait and mobility: Secondary | ICD-10-CM

## 2013-05-03 DIAGNOSIS — R42 Dizziness and giddiness: Secondary | ICD-10-CM

## 2013-05-03 DIAGNOSIS — R2681 Unsteadiness on feet: Secondary | ICD-10-CM

## 2013-05-03 NOTE — Patient Instructions (Signed)
Overall you are doing fairly well but I do want to suggest a few things today:   Remember to drink plenty of fluid, eat healthy meals and do not skip any meals. Try to eat protein with a every meal and eat a healthy snack such as fruit or nuts in between meals. Try to keep a regular sleep-wake schedule and try to exercise daily, particularly in the form of walking, 20-30 minutes a day, if you can.   As far as your medications are concerned, I would like to suggest limiting the usage of the meclizine  As far as diagnostic testing:  1)Please check to see if you have had a carotid ultrasound completed. If so, please fax the results to our office. If you have not had one completed I will order one for you  Please follow up with vestibular rehab. You should get good benefit from this  Follow up as needed. Please call us with any interim questions, concerns, problems, updates or refill requests.   My clinical assistant and will answer any of your questions and relay your messages to me and also relay most of my messages to you.   Our phone number is [phone]. We also have an after hours call service for urgent matters and there is a physician on-call for urgent questions. For any emergencies you know to call 911 or go to the nearest emergency room

## 2013-05-03 NOTE — Progress Notes (Signed)
[name]    Provider:  Dr Janann Colonel Referring Provider: Halford Chessman, MD Primary Care Physician:  Kim Kilts, MD  CC:  dizziness  HPI:  [name] is a 80 y.o. female here as a referral from Dr. Hilma [name] for chronic dizziness.  Patient recently saw her primary care physician with concerns of chronic dizziness that has been going on for >12 years. No worsening symptoms but just got "sick of feeling this way". Notes it is constant, worse with standing up too fast or leaning forwards. She reports symptoms are not chronic, come and go but can go on for days at a time. She describes a sensation of things moving around her. He notes she has had a [employer] and it caused her to feel very sick afterwards. The symptoms persist when standing and sitting down. If she stands up quickly she will also get very light headed, will get dark/cloudy vision, sometimes feels palpitations. She has a PPM in place for bradycardia.   She saw an ENT doctor who told her it was not related to an inner ear problem. They did not give her a diagnosis. Her [name] states that she has had multiple strokes in the past.   Has history of HTN. No DM. She does not hydrate well, drinks less than a few cups of water a day. Often skips meals and does not eat frequently.   Review of Systems: Out of a complete 14 system review, the patient complains of only the following symptoms, and all other reviewed systems are negative. + anemia, loss of vision, weight loss, memory loss, confusion, dizziness, decreased energy  History   Social History  . Marital Status: Married    Spouse Name: N/A    Number of Children: N/A  . Years of Education: N/A   Occupational History  . retired    Social History Main Topics  . Smoking status: Former Smoker -- 0.50 packs/day for 60 years    Types: Cigarettes  . Smokeless tobacco: Never Used     Comment: 02/09/2013 "quit smoking in ~ 2009"  . Alcohol Use: No   . Drug Use: No  . Sexual Activity: No   Other Topics Concern  . Not on file   Social History Narrative   3 children    Right handed   HS    Drinks decaff    Family History  Problem Relation Age of Onset  . Heart disease    . Arthritis    . Lung disease    . Asthma      Past Medical History  Diagnosis Date  . CAD (coronary artery disease)     CABG surgery in 06/2005 for left main disease; normal EF; negative stress nuclear 08/2008  . Cerebrovascular disease     1993-CVA; 08/2008-mild atherosclerosis without focal stenosis  . Syncope      Negative even recorder dated 3/08  . Tobacco abuse     60 pack years; continuing  . Orthostatic hypotension   . Diverticulosis   . Osteopenia   . GERD (gastroesophageal reflux disease)   . Hypertension   . Mental disorder     mild dementia  . Anemia   . Sinus bradycardia     s/p STJ Assurity dual chamber pacemaker by Dr Rayann Heman 02/2013  . Frequent PVCs   . Family history of anesthesia complication     "makes [name] sick" (02/09/2013)  . High cholesterol   . Asthma   .  Chronic bronchitis     "haven't had it in last 2-3 years; used to get it q yr" (02/09/2013)  . Pneumonia     "couple times; several years ago" (02/09/2013)  . FBPZWCHE(527.7)     "sometimes weekly" (02/09/2013)  . Arthritis     "fingers; back" (02/09/2013)  . Chronic back pain greater than 3 months duration     "nerve damage down into left leg" (02/09/2013)  . Frequent UTI   . Dementia     "more than mild; having more short term memory loss recently; on Aricept" (02/09/2013)    Past Surgical History  Procedure Laterality Date  . Abdominal hysterectomy  1990  . Ventral hernia repair    . Appendectomy    . Tonsillectomy    . Colonoscopy  10/10/2011    Procedure: COLONOSCOPY;  Surgeon: Rogene Houston, MD;  [facility];  Service: Endoscopy;  Laterality: N/A;  1230  . Esophagogastroduodenoscopy  10/10/2011    Procedure: ESOPHAGOGASTRODUODENOSCOPY (EGD);   Surgeon: Rogene Houston, MD;  [facility]: [facility];  Service: Endoscopy;  Laterality: N/A;  . Pacemaker insertion  02/09/2013    STJ Assurity pacemaker implanted by Dr Rayann Heman for symptomatic bradycardia  . Hernia repair    . Coronary artery bypass graft  2007    Left main disease  . Cardiac catheterization  2007  . Cataract extraction, bilateral      Current Outpatient Prescriptions  Medication Sig Dispense Refill  . acetaminophen (TYLENOL) 325 MG tablet Take 650 mg by mouth every 6 (six) hours as needed for mild pain. For pain      . alendronate (FOSAMAX) 70 MG tablet Take 70 mg by mouth once a week. Take with a full glass of water on an empty stomach.      Marland Kitchen aspirin EC 81 MG tablet Take 81 mg by mouth daily.      Marland Kitchen atorvastatin (LIPITOR) 10 MG tablet Take 10 mg by mouth daily.       Marland Kitchen atorvastatin (LIPITOR) 10 MG tablet TAKE ONE (1) TABLET EACH DAY  30 tablet  6  . Calcium-Vitamin D (CALTRATE 600 PLUS-VIT D PO) Take 2 tablets by mouth daily.       Marland Kitchen docusate sodium (COLACE) 100 MG capsule Take 1 capsule (100 mg total) by mouth 2 (two) times daily.  10 capsule  0  . donepezil (ARICEPT) 10 MG tablet Take 10 mg by mouth daily.       . ferrous sulfate 325 (65 FE) MG tablet Take 1 tablet (325 mg total) by mouth daily with breakfast.    3  . gabapentin (NEURONTIN) 100 MG capsule Take 1 capsule (100 mg total) by mouth 3 (three) times daily.  90 capsule  5  . meclizine (ANTIVERT) 25 MG tablet Take 25 mg by mouth 3 (three) times daily as needed for dizziness.      . nitroGLYCERIN (NITROSTAT) 0.4 MG SL tablet Place 1 tablet (0.4 mg total) under the tongue every 5 (five) minutes as needed for chest pain.  25 tablet  6  . Omega-3 Fatty Acids (FISH OIL) 1200 MG CAPS Take 1,200 mg by mouth 2 (two) times daily.       . potassium chloride (K-DUR) 10 MEQ tablet Take 2 tablets (20 mEq total) by mouth daily.  60 tablet  3  . vitamin B-12 (CYANOCOBALAMIN) 1000 MCG tablet Take 1,000 mcg by mouth daily.        . [DISCONTINUED] METOPROLOL SUCCINATE  ER PO Take by mouth.       No current facility-administered medications for this visit.    Allergies as of 05/03/2013 - Review Complete 05/03/2013  Allergen Reaction Noted  . Cephalexin Hives 09/19/2008  . Contrast media [iodinated diagnostic agents]  02/19/2011  . Iohexol  04/23/2003  . Penicillins Hives 09/19/2008  . Shellfish allergy Other (See Comments) 02/19/2011  . Sulfa antibiotics Hives 02/19/2011   Sitting BP 135/88, standing 119/87  Vitals: BP 119/87  Pulse 62  Ht 5\' 4"  (1.626 m)  Wt 119 lb (53.978 kg)  BMI 20.42 kg/m2 Last Weight:  Wt Readings from Last 1 Encounters:  05/03/13 119 lb (53.978 kg)   Last Height:   Ht Readings from Last 1 Encounters:  05/03/13 5\' 4"  (1.626 m)     Physical exam: Exam: Gen: NAD, conversant Eyes: anicteric sclerae, moist conjunctivae HENT: Atraumatic, oropharynx clear Neck: Trachea midline; supple,  Lungs: CTA, no wheezing, rales, rhonic                          CV: RRR, no MRG, no carotid bruits Abdomen: Soft, non-tender;  Extremities: No peripheral edema  Skin: Normal temperature, no rash,  Psych: Appropriate affect, pleasant  Neuro: MS: AA&Ox3, appropriately interactive, normal affect   Speech: fluent w/o paraphasic error  Memory: good recent and remote recall  CN: PERRL, EOMI no nystagmus noted, no ptosis, sensation intact to LT V1-V3 bilat, face symmetric, no weakness, hearing grossly intact, palate elevates symmetrically, shoulder shrug 5/5 bilat,  tongue protrudes midline, no fasiculations noted.  Dix Hallpike unremarkable  Motor: normal bulk and tone Strength: 5/5  In all extremities  Coord: rapid alternating and point-to-point (FNF, HTS) movements intact.  Reflexes: symmetrical, bilat downgoing toes  Sens: LT intact in all extremities, normal PP, temp, vibration and proprioception bilateral LE  Gait: mild wide based, wobbles but does not fall with  Romberg   Assessment:  After physical and neurologic examination, review of laboratory studies, imaging, neurophysiology testing and pre-existing records, assessment will be reviewed on the problem list.  Plan:  Treatment plan and additional workup will be reviewed under Problem List.  1)Vertigo 2)Orthostatic hypotension 3)Gait instability  80y/o woman presenting for initial evaluation of chronic dizziness described as having both a light headed and vertigo component. Suspect symptoms are likely multifactorial. Based on vitals she has findings consistent with orthostatic hypotension which is likely related to poor fluid intake. Counseled her extensively on importance of hydrating well frequently throughout the day. She expressed understanding. Due to history of strokes/vascular risk factors would have concern for vascular cause of vertigo. Will check carotid ultrasound if not already completed, can consider CT head pending results. Will refer patient to vestibular rehab. Follow up in 3 to 4 months   Jim Like, DO  [facility] [address] [place], [place] [address]  Phone [phone] Fax [phone]

## 2013-06-04 ENCOUNTER — Ambulatory Visit (INDEPENDENT_AMBULATORY_CARE_PROVIDER_SITE_OTHER): Payer: Medicare Other | Admitting: Internal Medicine

## 2013-06-04 ENCOUNTER — Encounter: Payer: Self-pay | Admitting: Internal Medicine

## 2013-06-04 VITALS — BP 134/65 | HR 77 | Ht 64.0 in | Wt 120.8 lb

## 2013-06-04 DIAGNOSIS — I493 Ventricular premature depolarization: Secondary | ICD-10-CM

## 2013-06-04 DIAGNOSIS — I10 Essential (primary) hypertension: Secondary | ICD-10-CM

## 2013-06-04 DIAGNOSIS — I2510 Atherosclerotic heart disease of native coronary artery without angina pectoris: Secondary | ICD-10-CM

## 2013-06-04 DIAGNOSIS — I495 Sick sinus syndrome: Secondary | ICD-10-CM

## 2013-06-04 DIAGNOSIS — I951 Orthostatic hypotension: Secondary | ICD-10-CM

## 2013-06-04 DIAGNOSIS — I4949 Other premature depolarization: Secondary | ICD-10-CM

## 2013-06-04 LAB — MDC_IDC_ENUM_SESS_TYPE_INCLINIC
Battery Remaining Longevity: 121.2 mo
Brady Statistic RA Percent Paced: 44 %
Implantable Pulse Generator Serial Number: 7586568
Lead Channel Impedance Value: 525 Ohm
Lead Channel Impedance Value: 650 Ohm
Lead Channel Pacing Threshold Amplitude: 0.75 V
Lead Channel Pacing Threshold Pulse Width: 0.4 ms
Lead Channel Pacing Threshold Pulse Width: 0.4 ms
Lead Channel Setting Pacing Amplitude: 2 V
Lead Channel Setting Pacing Amplitude: 2.5 V
Lead Channel Setting Pacing Pulse Width: 0.4 ms
Lead Channel Setting Sensing Sensitivity: 2 mV
MDC IDC MSMT BATTERY VOLTAGE: 3.04 V
MDC IDC MSMT LEADCHNL RA PACING THRESHOLD AMPLITUDE: 0.75 V
MDC IDC MSMT LEADCHNL RA SENSING INTR AMPL: 2 mV
MDC IDC MSMT LEADCHNL RV SENSING INTR AMPL: 12 mV
MDC IDC SESS DTM: 20150529103914
MDC IDC STAT BRADY RV PERCENT PACED: 1.9 %

## 2013-06-04 MED ORDER — METOPROLOL SUCCINATE ER 25 MG PO TB24
25.0000 mg | ORAL_TABLET | Freq: Every day | ORAL | Status: DC
Start: 2013-06-04 — End: 2013-07-23

## 2013-06-04 NOTE — Patient Instructions (Signed)
Your physician recommends that you schedule a follow-up appointment in: 1 year with Dr. Rayann Heman. You will receive a reminder letter in the mail in about 10 months reminding you to call and schedule your appointment. If you don't receive this letter, please contact our office. Your next device check with Delilah Shan is September 07, 2013. Your physician has recommended you make the following change in your medication: Start metoprolol succinate 25 mg daily.  Your new prescription was sent to your pharmacy. Continue all other medications the same.

## 2013-06-04 NOTE — Progress Notes (Signed)
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 female with a h/o CAD and chronic dizziness who presents for EP follow-up.  Her energy and exercise tolerance have improved since her pacemaker was implanted.  She continues to have postural dizziness but feels that her fatigue is better.  Today, she denies symptoms of palpitations, chest pain, shortness of breath, orthopnea, PND, lower extremity edema, presyncope, syncope, or neurologic sequela. The patient is tolerating medications without difficulties and is otherwise without complaint today.   Past Medical History  Diagnosis Date  . CAD (coronary artery disease)     CABG surgery in 06/2005 for left main disease; normal EF; negative stress nuclear 08/2008  . Cerebrovascular disease     1993-CVA; 08/2008-mild atherosclerosis without focal stenosis  . Syncope      Negative even recorder dated 3/08  . Tobacco abuse     60 pack years; continuing  . Orthostatic hypotension   . Diverticulosis   . Osteopenia   . GERD (gastroesophageal reflux disease)   . Hypertension   . Mental disorder     mild dementia  . Anemia   . Sinus bradycardia     s/p STJ Assurity dual chamber pacemaker by Dr Rayann Heman 02/2013  . Frequent PVCs   . Family history of anesthesia complication     "makes daughter sick" (02/09/2013)  . High cholesterol   . Asthma   . Chronic bronchitis     "haven't had it in last 2-3 years; used to get it q yr" (02/09/2013)  . Pneumonia     "couple times; several years ago" (02/09/2013)  . VOZDGUYQ(034.7)     "sometimes weekly" (02/09/2013)  . Arthritis     "fingers; back" (02/09/2013)  . Chronic back pain greater than 3 months duration     "nerve damage down into left leg" (02/09/2013)  . Frequent UTI   . Dementia     "more than mild; having more short term memory loss recently; on Aricept" (02/09/2013)   Past Surgical History  Procedure Laterality Date  . Abdominal  hysterectomy  1990  . Ventral hernia repair    . Appendectomy    . Tonsillectomy    . Colonoscopy  10/10/2011    Procedure: COLONOSCOPY;  Surgeon: Rogene Houston, MD;  [facility];  Service: Endoscopy;  Laterality: N/A;  1230  . Esophagogastroduodenoscopy  10/10/2011    Procedure: ESOPHAGOGASTRODUODENOSCOPY (EGD);  Surgeon: Rogene Houston, MD;  [facility];  Service: Endoscopy;  Laterality: N/A;  . Pacemaker insertion  02/09/2013    STJ Assurity pacemaker implanted by Dr Rayann Heman for symptomatic bradycardia  . Hernia repair    . Coronary artery bypass graft  2007    Left main disease  . Cardiac catheterization  2007  . Cataract extraction, bilateral      Current Outpatient Prescriptions  Medication Sig Dispense Refill  . acetaminophen (TYLENOL) 325 MG tablet Take 650 mg by mouth every 6 (six) hours as needed for mild pain. For pain      . alendronate (FOSAMAX) 70 MG tablet Take 70 mg by mouth once a week. Take with a full glass of water on an empty stomach.      Marland Kitchen aspirin EC 81 MG tablet Take 81 mg by mouth daily.      Marland Kitchen atorvastatin (LIPITOR) 10 MG tablet TAKE ONE (1) TABLET EACH DAY  30 tablet  6  .  Calcium-Vitamin D (CALTRATE 600 PLUS-VIT D PO) Take 2 tablets by mouth daily.       Marland Kitchen docusate sodium (COLACE) 100 MG capsule Take 1 capsule (100 mg total) by mouth 2 (two) times daily.  10 capsule  0  . donepezil (ARICEPT) 10 MG tablet Take 10 mg by mouth daily.       . ferrous sulfate 325 (65 FE) MG tablet Take 1 tablet (325 mg total) by mouth daily with breakfast.    3  . meclizine (ANTIVERT) 25 MG tablet Take 25 mg by mouth 3 (three) times daily as needed for dizziness.      . nitroGLYCERIN (NITROSTAT) 0.4 MG SL tablet Place 1 tablet (0.4 mg total) under the tongue every 5 (five) minutes as needed for chest pain.  25 tablet  6  . Omega-3 Fatty Acids (FISH OIL) 1200 MG CAPS Take 1,200 mg by mouth 2 (two) times daily.       . potassium chloride (K-DUR) 10 MEQ tablet  Take 2 tablets (20 mEq total) by mouth daily.  60 tablet  3  . vitamin B-12 (CYANOCOBALAMIN) 1000 MCG tablet Take 1,000 mcg by mouth daily.       . metoprolol succinate (TOPROL XL) 25 MG 24 hr tablet Take 1 tablet (25 mg total) by mouth daily.  30 tablet  6   No current facility-administered medications for this visit.    Allergies  Allergen Reactions  . Cephalexin Hives  . Contrast Media [Iodinated Diagnostic Agents]     Unknown  . Iohexol     Urticaria, emesis, numbness during IVP performed at [facility] years ago  . Penicillins Hives  . Shellfish Allergy Other (See Comments)    Unknown  . Sulfa Antibiotics Hives    History   Social History  . Marital Status: Married    Spouse Name: N/A    Number of Children: N/A  . Years of Education: N/A   Occupational History  . retired    Social History Main Topics  . Smoking status: Former Smoker -- 0.50 packs/day for 60 years    Types: Cigarettes  . Smokeless tobacco: Never Used     Comment: 02/09/2013 "quit smoking in ~ 2009"  . Alcohol Use: No  . Drug Use: No  . Sexual Activity: No   Other Topics Concern  . Not on file   Social History Narrative   3 children    Right handed   HS    Drinks decaff    Family History  Problem Relation Age of Onset  . Heart disease    . Arthritis    . Lung disease    . Asthma       Physical Exam: Filed Vitals:   06/04/13 1006  BP: 134/65  Pulse: 77  Height: 5\' 4"  (1.626 m)  Weight: 120 lb 12.8 oz (54.795 kg)    GEN- The patient is elderly appearing, alert and oriented x 3 today.  Head- normocephalic, atraumatic Eyes-  Sclera clear, conjunctiva pink Ears- hearing intact Oropharynx- clear Neck- supple  Lungs- Clear to ausculation bilaterally, normal work of breathing Heart- RRR with frequent ectopy GI- soft, NT, ND, + BS Extremities- no clubbing, cyanosis, + edema MS- no significant deformity or atrophy Skin- no rash or lesion Psych- a degree of memory  impairment is evident Neuro- strength and sensation are intact  Pacemaker interrogation- see paceArt  Assessment and Plan:  1. Sick sinus syndrome/ symptomatic sinus bradycardia Improved s/p PPM Normal  pacemaker function See Pace Art report No changes today  2. PVCs 13% burden by PPM interrogation today. Not closely coupled to the proceeding t wave.  These are outflow tract pvcs and likely benign. Echo reveals preserved EF.  Given her advanced age, I think that we should avoid ablation for her PVCs I will start metoprolol succinate 25mg  daily today.  She will follow-up with Dr Bronson Ing in July  3. CAD Stable No change required today  4. HTN Stable No change required today  5. Dizziness Postural mostly in nature Salt liberalization and increase fluid intake are encouraged today  Merlin compliant Return to see me in 1 year

## 2013-07-15 ENCOUNTER — Other Ambulatory Visit: Payer: Self-pay | Admitting: *Deleted

## 2013-07-15 MED ORDER — GABAPENTIN 100 MG PO CAPS: 100.0000 mg | ORAL_CAPSULE | Freq: Three times a day (TID) | ORAL | Status: DC

## 2013-07-23 ENCOUNTER — Ambulatory Visit (INDEPENDENT_AMBULATORY_CARE_PROVIDER_SITE_OTHER): Payer: Medicare Other | Admitting: Cardiovascular Disease

## 2013-07-23 ENCOUNTER — Encounter: Payer: Self-pay | Admitting: Cardiovascular Disease

## 2013-07-23 VITALS — BP 108/62 | HR 68 | Ht 64.0 in | Wt 121.0 lb

## 2013-07-23 DIAGNOSIS — I25810 Atherosclerosis of coronary artery bypass graft(s) without angina pectoris: Secondary | ICD-10-CM

## 2013-07-23 DIAGNOSIS — I495 Sick sinus syndrome: Secondary | ICD-10-CM

## 2013-07-23 DIAGNOSIS — I10 Essential (primary) hypertension: Secondary | ICD-10-CM

## 2013-07-23 DIAGNOSIS — R42 Dizziness and giddiness: Secondary | ICD-10-CM

## 2013-07-23 DIAGNOSIS — Z950 Presence of cardiac pacemaker: Secondary | ICD-10-CM

## 2013-07-23 DIAGNOSIS — Z713 Dietary counseling and surveillance: Secondary | ICD-10-CM

## 2013-07-23 DIAGNOSIS — I951 Orthostatic hypotension: Secondary | ICD-10-CM

## 2013-07-23 DIAGNOSIS — E785 Hyperlipidemia, unspecified: Secondary | ICD-10-CM

## 2013-07-23 NOTE — Progress Notes (Signed)
Patient ID: [name], female   DOB: 1925-07-26, 80 y.o.   MRN: [id number]      SUBJECTIVE: The patient is here to followup for her history of coronary artery disease with a history of CABG, hypertension, hyperlipidemia, and CVA.  Her most recent nuclear stress test was in November 2013 which showed no evidence of ischemia.  She had a pacemaker placed for tachycardia-bradycardia syndrome by Dr. Rayann Heman. She also has outflow tract PVCs for which she was started on metoprolol, but this was d/c by her PCP as it didn't make her feel well.  Echo in 01/2013 demonstrated the following:  Mild LVH with LVEF 19-62%, grade 2 diastolic dysfunction. Moderate left atrial enlargement. MIldly thickened mtiral leaflets with mild, posteriorly directed mtiral regurgitation. Mildly sclerotic aortic valve with trivial aortic regurgitation. Mild RV dilatation. Mild to moderate tricuspid regurgitation with moderate pulmonary hypertension, PASP 52 mmHg.  She is feeling very well today, and denies chest pain, SOB, palpitations, and leg swelling. She saw a neurologist for her dizziness and increased fluid intake and better nutrition was emphasized.  She is here with her daughter, who has been encouraging both fluid and proper nutritional intake. They are going to [place] in the near future.   Allergies  Allergen Reactions  . Cephalexin Hives  . Contrast Media [Iodinated Diagnostic Agents]     Unknown  . Iohexol     Urticaria, emesis, numbness during IVP performed at [facility] years ago  . Penicillins Hives  . Shellfish Allergy Other (See Comments)    Unknown  . Sulfa Antibiotics Hives    Current Outpatient Prescriptions  Medication Sig Dispense Refill  . acetaminophen (TYLENOL) 325 MG tablet Take 650 mg by mouth every 6 (six) hours as needed for mild pain. For pain      . alendronate (FOSAMAX) 70 MG tablet Take 70 mg by mouth once a week. Take with a full glass of water on an empty  stomach.      Marland Kitchen aspirin EC 81 MG tablet Take 81 mg by mouth daily.      Marland Kitchen atorvastatin (LIPITOR) 10 MG tablet TAKE ONE (1) TABLET EACH DAY  30 tablet  6  . Calcium-Vitamin D (CALTRATE 600 PLUS-VIT D PO) Take 2 tablets by mouth daily.       Marland Kitchen docusate sodium (COLACE) 100 MG capsule Take 1 capsule (100 mg total) by mouth 2 (two) times daily.  10 capsule  0  . donepezil (ARICEPT) 10 MG tablet Take 10 mg by mouth daily.       . ferrous sulfate 325 (65 FE) MG tablet Take 1 tablet (325 mg total) by mouth daily with breakfast.    3  . meclizine (ANTIVERT) 25 MG tablet Take 25 mg by mouth 3 (three) times daily as needed for dizziness.      . nitroGLYCERIN (NITROSTAT) 0.4 MG SL tablet Place 1 tablet (0.4 mg total) under the tongue every 5 (five) minutes as needed for chest pain.  25 tablet  6  . Omega-3 Fatty Acids (FISH OIL) 1200 MG CAPS Take 1,200 mg by mouth 2 (two) times daily.       . vitamin B-12 (CYANOCOBALAMIN) 1000 MCG tablet Take 1,000 mcg by mouth daily.       Marland Kitchen gabapentin (NEURONTIN) 100 MG capsule Take 1 capsule (100 mg total) by mouth 3 (three) times daily.  90 capsule  5  . metoprolol succinate (TOPROL XL) 25 MG 24 hr tablet Take 1  tablet (25 mg total) by mouth daily.  30 tablet  6  . potassium chloride (K-DUR) 10 MEQ tablet Take 2 tablets (20 mEq total) by mouth daily.  60 tablet  3   No current facility-administered medications for this visit.    Past Medical History  Diagnosis Date  . CAD (coronary artery disease)     CABG surgery in 06/2005 for left main disease; normal EF; negative stress nuclear 08/2008  . Cerebrovascular disease     1993-CVA; 08/2008-mild atherosclerosis without focal stenosis  . Syncope      Negative even recorder dated 3/08  . Tobacco abuse     60 pack years; continuing  . Orthostatic hypotension   . Diverticulosis   . Osteopenia   . GERD (gastroesophageal reflux disease)   . Hypertension   . Mental disorder     mild dementia  . Anemia   . Sinus  bradycardia     s/p STJ Assurity dual chamber pacemaker by Dr Rayann Heman 02/2013  . Frequent PVCs   . Family history of anesthesia complication     "makes daughter sick" (02/09/2013)  . High cholesterol   . Asthma   . Chronic bronchitis     "haven't had it in last 2-3 years; used to get it q yr" (02/09/2013)  . Pneumonia     "couple times; several years ago" (02/09/2013)  . TDHRCBUL(845.3)     "sometimes weekly" (02/09/2013)  . Arthritis     "fingers; back" (02/09/2013)  . Chronic back pain greater than 3 months duration     "nerve damage down into left leg" (02/09/2013)  . Frequent UTI   . Dementia     "more than mild; having more short term memory loss recently; on Aricept" (02/09/2013)    Past Surgical History  Procedure Laterality Date  . Abdominal hysterectomy  1990  . Ventral hernia repair    . Appendectomy    . Tonsillectomy    . Colonoscopy  10/10/2011    Procedure: COLONOSCOPY;  Surgeon: Rogene Houston, MD;  [facility];  Service: Endoscopy;  Laterality: N/A;  1230  . Esophagogastroduodenoscopy  10/10/2011    Procedure: ESOPHAGOGASTRODUODENOSCOPY (EGD);  Surgeon: Rogene Houston, MD;  [facility];  Service: Endoscopy;  Laterality: N/A;  . Pacemaker insertion  02/09/2013    STJ Assurity pacemaker implanted by Dr Rayann Heman for symptomatic bradycardia  . Hernia repair    . Coronary artery bypass graft  2007    Left main disease  . Cardiac catheterization  2007  . Cataract extraction, bilateral      History   Social History  . Marital Status: Married    Spouse Name: N/A    Number of Children: N/A  . Years of Education: N/A   Occupational History  . retired    Social History Main Topics  . Smoking status: Former Smoker -- 0.50 packs/day for 60 years    Types: Cigarettes  . Smokeless tobacco: Never Used     Comment: 02/09/2013 "quit smoking in ~ 2009"  . Alcohol Use: No  . Drug Use: No  . Sexual Activity: No   Other Topics Concern  . Not on file    Social History Narrative   3 children    Right handed   HS    Drinks decaff     Filed Vitals:   07/23/13 1313  BP: 108/62  Pulse: 68  Height: 5\' 4"  (1.626 m)  Weight: 121 lb (54.885 kg)  SpO2: 95%    PHYSICAL EXAM General: NAD Neck: No JVD, no thyromegaly. Lungs: Clear to auscultation bilaterally with normal respiratory effort. CV: Nondisplaced PMI.  Regular rate and rhythm, normal S1/S2, no S3/S4, no murmur. No pretibial or periankle edema.  No carotid bruit.  Normal pedal pulses.  Abdomen: Soft, nontender, no hepatosplenomegaly, no distention.  Neurologic: Alert and oriented x 3.  Psych: Normal affect. Extremities: No clubbing or cyanosis.   ECG: reviewed and available in electronic records.      ASSESSMENT AND PLAN: 1. Sick sinus syndrome s/p pacemaker: Stable and follows with Dr. Rayann Heman.  2. CAD s/p CABG: Symptomatically stable. No changes in medications required today. Continue ASA and Lipitor.   3. HTN: Controlled without antihypertensive therapy.  4. Hyperlipidemia: on Lipitor and fish oil.   5. PVC's: Unable to tolerate metoprolol.  6. Dizziness: Adequate fluid and protein intake emphasized.  Dispo: f/u 6 months.  Kate Sable, M.D., F.A.C.C.

## 2013-07-23 NOTE — Patient Instructions (Signed)
Your physician wants you to follow-up in: 6 months You will receive a reminder letter in the mail two months in advance. If you don't receive a letter, please call our office to schedule the follow-up appointment.     Your physician recommends that you continue on your current medications as directed. Please refer to the Current Medication list given to you today.      Thank you for choosing [facility] !        

## 2013-09-02 ENCOUNTER — Ambulatory Visit: Payer: Medicare Other | Admitting: Neurology

## 2013-09-07 ENCOUNTER — Ambulatory Visit (INDEPENDENT_AMBULATORY_CARE_PROVIDER_SITE_OTHER): Payer: Medicare Other | Admitting: *Deleted

## 2013-09-07 ENCOUNTER — Encounter: Payer: Self-pay | Admitting: Internal Medicine

## 2013-09-07 DIAGNOSIS — I495 Sick sinus syndrome: Secondary | ICD-10-CM

## 2013-09-07 DIAGNOSIS — Z950 Presence of cardiac pacemaker: Secondary | ICD-10-CM

## 2013-09-07 LAB — MDC_IDC_ENUM_SESS_TYPE_REMOTE
Battery Remaining Longevity: 118 mo
Battery Remaining Percentage: 95.5 %
Battery Voltage: 3.02 V
Brady Statistic AP VP Percent: 1.5 %
Brady Statistic AS VP Percent: 1 %
Brady Statistic RA Percent Paced: 41 %
Brady Statistic RV Percent Paced: 1.5 %
Date Time Interrogation Session: 20150901060011
Implantable Pulse Generator Model: 2240
Implantable Pulse Generator Serial Number: 7586568
Lead Channel Impedance Value: 490 Ohm
Lead Channel Impedance Value: 660 Ohm
Lead Channel Pacing Threshold Amplitude: 0.75 V
Lead Channel Pacing Threshold Amplitude: 0.75 V
Lead Channel Pacing Threshold Pulse Width: 0.4 ms
Lead Channel Pacing Threshold Pulse Width: 0.4 ms
Lead Channel Sensing Intrinsic Amplitude: 12 mV
Lead Channel Sensing Intrinsic Amplitude: 2.4 mV
Lead Channel Setting Pacing Pulse Width: 0.4 ms
Lead Channel Setting Sensing Sensitivity: 2 mV
MDC IDC SET LEADCHNL RA PACING AMPLITUDE: 2 V
MDC IDC SET LEADCHNL RV PACING AMPLITUDE: 2.5 V
MDC IDC STAT BRADY AP VS PERCENT: 60 %
MDC IDC STAT BRADY AS VS PERCENT: 24 %

## 2013-09-07 NOTE — Progress Notes (Signed)
Remote pacemaker transmission.   

## 2013-09-10 ENCOUNTER — Ambulatory Visit: Payer: Medicare Other | Admitting: Neurology

## 2013-09-15 ENCOUNTER — Ambulatory Visit (INDEPENDENT_AMBULATORY_CARE_PROVIDER_SITE_OTHER): Payer: Medicare Other | Admitting: Neurology

## 2013-09-15 ENCOUNTER — Encounter: Payer: Self-pay | Admitting: Neurology

## 2013-09-15 ENCOUNTER — Encounter: Payer: Self-pay | Admitting: Cardiology

## 2013-09-15 VITALS — BP 161/93 | HR 71 | Ht 64.0 in | Wt 124.0 lb

## 2013-09-15 DIAGNOSIS — R2681 Unsteadiness on feet: Secondary | ICD-10-CM

## 2013-09-15 DIAGNOSIS — R269 Unspecified abnormalities of gait and mobility: Secondary | ICD-10-CM

## 2013-09-15 DIAGNOSIS — I951 Orthostatic hypotension: Secondary | ICD-10-CM

## 2013-09-15 NOTE — Progress Notes (Signed)
[facility]    Provider:  Dr Janann Colonel Referring Provider: Sharilyn Sites, MD Primary Care Physician:  Purvis Kilts, MD  CC:  dizziness  HPI:  [name] is a 80 y.o. female here as a follow up from Dr. Hilma Favors for chronic dizziness. At last visit she was noted to have symptoms consistent with both vertigo and orthostatic hypotension. She was referred to vestibular rehab and instructed to increase her fluid/salt intake. Since last visit she continues to have some light headed sensations upon standing. Will improve with sitting down. Daughter notes BP fluctuates throughout the day. She has increased her fluid and salt intake. No graying out of vision or LOC. Notes brief intermittent palpitations. Recently evaluated by cardiology and PPM appears to be functioning well.   She did not complete a course of vestibular rehab. Continues to have intermittent periods of vertigo. She continues to take meclizine as needed for this. Daughter thinks meclizine makes her worse, has side effects from it.   Initial visit Patient recently saw her primary care physician with concerns of chronic dizziness that has been going on for >12 years. No worsening symptoms but just got "sick of feeling this way". Notes it is constant, worse with standing up too fast or leaning forwards. She reports symptoms are not chronic, come and go but can go on for days at a time. She describes a sensation of things moving around her. He notes she has had a [employer] and it caused her to feel very sick afterwards. The symptoms persist when standing and sitting down. If she stands up quickly she will also get very light headed, will get dark/cloudy vision, sometimes feels palpitations. She has a PPM in place for bradycardia.   She saw an ENT doctor who told her it was not related to an inner ear problem. They did not give her a diagnosis. Her daughter states that she has had multiple strokes in the past.    Has history of HTN. No DM. She does not hydrate well, drinks less than a few cups of water a day. Often skips meals and does not eat frequently.   Review of Systems: Out of a complete 14 system review, the patient complains of only the following symptoms, and all other reviewed systems are negative. + anemia, loss of vision, weight loss, memory loss, confusion, dizziness, decreased energy  History   Social History  . Marital Status: Married    Spouse Name: Percell Miller     Number of Children: N/A  . Years of Education: 12   Occupational History  . retired    Social History Main Topics  . Smoking status: Former Smoker -- 0.50 packs/day for 60 years    Types: Cigarettes  . Smokeless tobacco: Never Used     Comment: 02/09/2013 "quit smoking in ~ 2009"  . Alcohol Use: No  . Drug Use: No  . Sexual Activity: No   Other Topics Concern  . Not on file   Social History Narrative   3 children    Right handed   HS    Drinks decaff    Family History  Problem Relation Age of Onset  . Heart disease    . Arthritis    . Lung disease    . Asthma      Past Medical History  Diagnosis Date  . CAD (coronary artery disease)     CABG surgery in 06/2005 for left main disease; normal EF; negative stress nuclear 08/2008  .  Cerebrovascular disease     1993-CVA; 08/2008-mild atherosclerosis without focal stenosis  . Syncope      Negative even recorder dated 3/08  . Tobacco abuse     60 pack years; continuing  . Orthostatic hypotension   . Diverticulosis   . Osteopenia   . GERD (gastroesophageal reflux disease)   . Hypertension   . Mental disorder     mild dementia  . Anemia   . Sinus bradycardia     s/p STJ Assurity dual chamber pacemaker by Dr Rayann Heman 02/2013  . Frequent PVCs   . Family history of anesthesia complication     "makes daughter sick" (02/09/2013)  . High cholesterol   . Asthma   . Chronic bronchitis     "haven't had it in last 2-3 years; used to get it q yr" (02/09/2013)  .  Pneumonia     "couple times; several years ago" (02/09/2013)  . MGQQPYPP(509.3)     "sometimes weekly" (02/09/2013)  . Arthritis     "fingers; back" (02/09/2013)  . Chronic back pain greater than 3 months duration     "nerve damage down into left leg" (02/09/2013)  . Frequent UTI   . Dementia     "more than mild; having more short term memory loss recently; on Aricept" (02/09/2013)    Past Surgical History  Procedure Laterality Date  . Abdominal hysterectomy  1990  . Ventral hernia repair    . Appendectomy    . Tonsillectomy    . Colonoscopy  10/10/2011    Procedure: COLONOSCOPY;  Surgeon: Rogene Houston, MD;  Location: [facility];  Service: Endoscopy;  Laterality: N/A;  1230  . Esophagogastroduodenoscopy  10/10/2011    Procedure: ESOPHAGOGASTRODUODENOSCOPY (EGD);  Surgeon: Rogene Houston, MD;  Location: [facility];  Service: Endoscopy;  Laterality: N/A;  . Pacemaker insertion  02/09/2013    STJ Assurity pacemaker implanted by Dr Rayann Heman for symptomatic bradycardia  . Hernia repair    . Coronary artery bypass graft  2007    Left main disease  . Cardiac catheterization  2007  . Cataract extraction, bilateral      Current Outpatient Prescriptions  Medication Sig Dispense Refill  . acetaminophen (TYLENOL) 325 MG tablet Take 650 mg by mouth every 6 (six) hours as needed for mild pain. For pain      . alendronate (FOSAMAX) 70 MG tablet Take 70 mg by mouth once a week. Take with a full glass of water on an empty stomach.      Marland Kitchen aspirin EC 81 MG tablet Take 81 mg by mouth daily.      Marland Kitchen atorvastatin (LIPITOR) 10 MG tablet TAKE ONE (1) TABLET EACH DAY  30 tablet  6  . Calcium-Vitamin D (CALTRATE 600 PLUS-VIT D PO) Take 2 tablets by mouth daily.       Marland Kitchen docusate sodium (COLACE) 100 MG capsule Take 1 capsule (100 mg total) by mouth 2 (two) times daily.  10 capsule  0  . donepezil (ARICEPT) 10 MG tablet Take 10 mg by mouth daily.       . ferrous sulfate 325 (65 FE) MG tablet Take 1 tablet (325  mg total) by mouth daily with breakfast.    3  . gabapentin (NEURONTIN) 100 MG capsule Take 1 capsule (100 mg total) by mouth 3 (three) times daily.  90 capsule  5  . meclizine (ANTIVERT) 25 MG tablet Take 25 mg by mouth 3 (three) times daily as needed for  dizziness.      . nitroGLYCERIN (NITROSTAT) 0.4 MG SL tablet Place 1 tablet (0.4 mg total) under the tongue every 5 (five) minutes as needed for chest pain.  25 tablet  6  . Omega-3 Fatty Acids (FISH OIL) 1200 MG CAPS Take 1,200 mg by mouth 2 (two) times daily.       . potassium chloride (K-DUR) 10 MEQ tablet Take 2 tablets (20 mEq total) by mouth daily.  60 tablet  3  . vitamin B-12 (CYANOCOBALAMIN) 1000 MCG tablet Take 1,000 mcg by mouth daily.        No current facility-administered medications for this visit.    Allergies as of 09/15/2013 - Review Complete 09/15/2013  Allergen Reaction Noted  . Cephalexin Hives 09/19/2008  . Contrast media [iodinated diagnostic agents]  02/19/2011  . Iohexol  04/23/2003  . Penicillins Hives 09/19/2008  . Shellfish allergy Other (See Comments) 02/19/2011  . Sulfa antibiotics Hives 02/19/2011     Vitals: BP 161/93  Pulse 71  Ht 5\' 4"  (1.626 m)  Wt 124 lb (56.246 kg)  BMI 21.27 kg/m2 Last Weight:  Wt Readings from Last 1 Encounters:  09/15/13 124 lb (56.246 kg)   Last Height:   Ht Readings from Last 1 Encounters:  09/15/13 5\' 4"  (1.626 m)     Physical exam: Exam: Gen: NAD, conversant Eyes: anicteric sclerae, moist conjunctivae HENT: Atraumatic, oropharynx clear Neck: Trachea midline; supple,  Lungs: CTA, no wheezing, rales, rhonic                          CV: RRR, no MRG, no carotid bruits Abdomen: Soft, non-tender;  Extremities: No peripheral edema  Skin: Normal temperature, no rash,  Psych: Appropriate affect, pleasant  Neuro: MS: AA&Ox3, appropriately interactive, normal affect   Speech: fluent w/o paraphasic error  Memory: good recent and remote recall  CN: PERRL,  EOMI no nystagmus noted, no ptosis, sensation intact to LT V1-V3 bilat, face symmetric, no weakness, hearing grossly intact, palate elevates symmetrically, shoulder shrug 5/5 bilat,  tongue protrudes midline, no fasiculations noted.  Dix Hallpike unremarkable  Motor: normal bulk and tone Strength: 5/5  In all extremities  Coord: rapid alternating and point-to-point (FNF, HTS) movements intact.  Reflexes: symmetrical, bilat downgoing toes  Sens: LT intact in all extremities, normal PP, temp, vibration and proprioception bilateral LE  Gait: mild wide based, wobbles but does not fall with Romberg   Assessment:  After physical and neurologic examination, review of laboratory studies, imaging, neurophysiology testing and pre-existing records, assessment will be reviewed on the problem list.  Plan:  Treatment plan and additional workup will be reviewed under Problem List.  1)Vertigo 2)Orthostatic hypotension 3)Gait instability  80y/o woman presenting for follow up evaluation of chronic dizziness described as having both a light headed and vertigo component. Suspect symptoms are likely multifactorial. Based on vitals she has findings consistent with orthostatic hypotension. Counseled her extensively on importance of hydrating well frequently throughout the day. She expressed understanding. She should work with her PCP to adjust BP medications to get increased stability in BP levels. Suggest tapering off meclizine as this could worsen her symptoms. Will refer to vestibular rehab for therapy. Follow up as needed.   Jim Like, DO  [facility] [address] [place], [place] [address]  Phone [phone] Fax [phone]

## 2013-09-15 NOTE — Patient Instructions (Signed)
Overall you are doing fairly well but I do want to suggest a few things today:   Remember to drink plenty of fluid, eat healthy meals and do not skip any meals. Try to eat protein with a every meal and eat a healthy snack such as fruit or nuts in between meals. Try to keep a regular sleep-wake schedule and try to exercise daily, particularly in the form of walking, 20-30 minutes a day, if you can.   As far as your medications are concerned, I would like to suggest you work with Dr Hilma Favors to taper off the meclizine  I have sent a referral to physical therapy, you may benefit from vestibular rehab to help with your balance.   Follow up as needed. Please call us with any interim questions, concerns, problems, updates or refill requests.   My clinical assistant and will answer any of your questions and relay your messages to me and also relay most of my messages to you.   Our phone number is [phone]. We also have an after hours call service for urgent matters and there is a physician on-call for urgent questions. For any emergencies you know to call 911 or go to the nearest emergency room

## 2013-09-20 ENCOUNTER — Encounter (HOSPITAL_COMMUNITY): Payer: Self-pay | Admitting: Emergency Medicine

## 2013-09-20 ENCOUNTER — Inpatient Hospital Stay (HOSPITAL_COMMUNITY)
Admission: EM | Admit: 2013-09-20 | Discharge: 2013-09-23 | DRG: 872 | Disposition: A | Discharge status: Home Health | Payer: Medicare Other | Attending: Family Medicine | Admitting: Family Medicine

## 2013-09-20 ENCOUNTER — Emergency Department (HOSPITAL_COMMUNITY): Payer: Medicare Other

## 2013-09-20 DIAGNOSIS — N390 Urinary tract infection, site not specified: Secondary | ICD-10-CM

## 2013-09-20 DIAGNOSIS — Z9181 History of falling: Secondary | ICD-10-CM

## 2013-09-20 DIAGNOSIS — R0789 Other chest pain: Secondary | ICD-10-CM | POA: Diagnosis present

## 2013-09-20 DIAGNOSIS — Z7982 Long term (current) use of aspirin: Secondary | ICD-10-CM

## 2013-09-20 DIAGNOSIS — Z951 Presence of aortocoronary bypass graft: Secondary | ICD-10-CM

## 2013-09-20 DIAGNOSIS — J45901 Unspecified asthma with (acute) exacerbation: Secondary | ICD-10-CM

## 2013-09-20 DIAGNOSIS — R079 Chest pain, unspecified: Secondary | ICD-10-CM

## 2013-09-20 DIAGNOSIS — Z950 Presence of cardiac pacemaker: Secondary | ICD-10-CM

## 2013-09-20 DIAGNOSIS — Z825 Family history of asthma and other chronic lower respiratory diseases: Secondary | ICD-10-CM

## 2013-09-20 DIAGNOSIS — G8929 Other chronic pain: Secondary | ICD-10-CM | POA: Diagnosis present

## 2013-09-20 DIAGNOSIS — I679 Cerebrovascular disease, unspecified: Secondary | ICD-10-CM

## 2013-09-20 DIAGNOSIS — M549 Dorsalgia, unspecified: Secondary | ICD-10-CM | POA: Diagnosis present

## 2013-09-20 DIAGNOSIS — Z66 Do not resuscitate: Secondary | ICD-10-CM | POA: Diagnosis present

## 2013-09-20 DIAGNOSIS — M129 Arthropathy, unspecified: Secondary | ICD-10-CM | POA: Diagnosis present

## 2013-09-20 DIAGNOSIS — F0390 Unspecified dementia without behavioral disturbance: Secondary | ICD-10-CM | POA: Diagnosis present

## 2013-09-20 DIAGNOSIS — I10 Essential (primary) hypertension: Secondary | ICD-10-CM

## 2013-09-20 DIAGNOSIS — I2510 Atherosclerotic heart disease of native coronary artery without angina pectoris: Secondary | ICD-10-CM

## 2013-09-20 DIAGNOSIS — R509 Fever, unspecified: Secondary | ICD-10-CM | POA: Diagnosis not present

## 2013-09-20 DIAGNOSIS — M8580 Other specified disorders of bone density and structure, unspecified site: Secondary | ICD-10-CM

## 2013-09-20 DIAGNOSIS — J209 Acute bronchitis, unspecified: Secondary | ICD-10-CM

## 2013-09-20 DIAGNOSIS — R6510 Systemic inflammatory response syndrome (SIRS) of non-infectious origin without acute organ dysfunction: Secondary | ICD-10-CM

## 2013-09-20 DIAGNOSIS — Z720 Tobacco use: Secondary | ICD-10-CM

## 2013-09-20 DIAGNOSIS — A419 Sepsis, unspecified organism: Principal | ICD-10-CM | POA: Diagnosis present

## 2013-09-20 DIAGNOSIS — E785 Hyperlipidemia, unspecified: Secondary | ICD-10-CM

## 2013-09-20 DIAGNOSIS — K219 Gastro-esophageal reflux disease without esophagitis: Secondary | ICD-10-CM | POA: Diagnosis present

## 2013-09-20 DIAGNOSIS — I495 Sick sinus syndrome: Secondary | ICD-10-CM

## 2013-09-20 DIAGNOSIS — R0602 Shortness of breath: Secondary | ICD-10-CM

## 2013-09-20 DIAGNOSIS — Z0189 Encounter for other specified special examinations: Secondary | ICD-10-CM

## 2013-09-20 DIAGNOSIS — Z8673 Personal history of transient ischemic attack (TIA), and cerebral infarction without residual deficits: Secondary | ICD-10-CM

## 2013-09-20 DIAGNOSIS — Z23 Encounter for immunization: Secondary | ICD-10-CM

## 2013-09-20 DIAGNOSIS — D649 Anemia, unspecified: Secondary | ICD-10-CM | POA: Diagnosis present

## 2013-09-20 DIAGNOSIS — Z79899 Other long term (current) drug therapy: Secondary | ICD-10-CM

## 2013-09-20 DIAGNOSIS — E876 Hypokalemia: Secondary | ICD-10-CM

## 2013-09-20 DIAGNOSIS — R0600 Dyspnea, unspecified: Secondary | ICD-10-CM

## 2013-09-20 DIAGNOSIS — I493 Ventricular premature depolarization: Secondary | ICD-10-CM

## 2013-09-20 DIAGNOSIS — I951 Orthostatic hypotension: Secondary | ICD-10-CM

## 2013-09-20 DIAGNOSIS — E7800 Pure hypercholesterolemia, unspecified: Secondary | ICD-10-CM | POA: Diagnosis present

## 2013-09-20 DIAGNOSIS — I5033 Acute on chronic diastolic (congestive) heart failure: Secondary | ICD-10-CM

## 2013-09-20 DIAGNOSIS — Z87440 Personal history of urinary (tract) infections: Secondary | ICD-10-CM

## 2013-09-20 LAB — CBC WITH DIFFERENTIAL/PLATELET
Basophils Absolute: 0 10*3/uL (ref 0.0–0.1)
Basophils Relative: 0 % (ref 0–1)
EOS ABS: 0 10*3/uL (ref 0.0–0.7)
Eosinophils Relative: 0 % (ref 0–5)
HCT: 43.3 % (ref 36.0–46.0)
Hemoglobin: 14.5 g/dL (ref 12.0–15.0)
LYMPHS PCT: 5 % — AB (ref 12–46)
Lymphs Abs: 0.6 10*3/uL — ABNORMAL LOW (ref 0.7–4.0)
MCH: 31.4 pg (ref 26.0–34.0)
MCHC: 33.5 g/dL (ref 30.0–36.0)
MCV: 93.7 fL (ref 78.0–100.0)
Monocytes Absolute: 0.8 10*3/uL (ref 0.1–1.0)
Monocytes Relative: 6 % (ref 3–12)
NEUTROS PCT: 89 % — AB (ref 43–77)
Neutro Abs: 12.2 10*3/uL — ABNORMAL HIGH (ref 1.7–7.7)
PLATELETS: 165 10*3/uL (ref 150–400)
RBC: 4.62 MIL/uL (ref 3.87–5.11)
RDW: 12.8 % (ref 11.5–15.5)
WBC: 13.7 10*3/uL — ABNORMAL HIGH (ref 4.0–10.5)

## 2013-09-20 LAB — TROPONIN I
Troponin I: <0.3 ng/mL (ref <0.30)
Troponin I: <0.3 ng/mL (ref <0.30)
Troponin I: <0.3 ng/mL (ref <0.30)

## 2013-09-20 LAB — URINALYSIS, ROUTINE W REFLEX MICROSCOPIC
Glucose, UA: NEGATIVE mg/dL
NITRITE: POSITIVE — AB
Protein, ur: 100 mg/dL — AB
SPECIFIC GRAVITY, URINE: 1.02 (ref 1.005–1.030)
UROBILINOGEN UA: 0.2 mg/dL (ref 0.0–1.0)
pH: 6 (ref 5.0–8.0)

## 2013-09-20 LAB — URINE MICROSCOPIC-ADD ON

## 2013-09-20 LAB — COMPREHENSIVE METABOLIC PANEL
ALK PHOS: 55 U/L (ref 39–117)
ALT: 13 U/L (ref 0–35)
AST: 22 U/L (ref 0–37)
Albumin: 3.9 g/dL (ref 3.5–5.2)
Anion gap: 12 (ref 5–15)
BUN: 14 mg/dL (ref 6–23)
CO2: 26 mEq/L (ref 19–32)
Calcium: 9.2 mg/dL (ref 8.4–10.5)
Chloride: 102 mEq/L (ref 96–112)
Creatinine, Ser: 0.75 mg/dL (ref 0.50–1.10)
GFR calc non Af Amer: 75 mL/min — ABNORMAL LOW (ref >90)
GFR, EST AFRICAN AMERICAN: 87 mL/min — AB (ref >90)
Glucose, Bld: 121 mg/dL — ABNORMAL HIGH (ref 70–99)
POTASSIUM: 4.1 meq/L (ref 3.7–5.3)
SODIUM: 140 meq/L (ref 137–147)
TOTAL PROTEIN: 7 g/dL (ref 6.0–8.3)
Total Bilirubin: 0.4 mg/dL (ref 0.3–1.2)

## 2013-09-20 LAB — LIPID PANEL
CHOLESTEROL: 144 mg/dL (ref 0–200)
HDL: 84 mg/dL (ref >39)
LDL Cholesterol: 50 mg/dL (ref 0–99)
TRIGLYCERIDES: 50 mg/dL (ref <150)
Total CHOL/HDL Ratio: 1.7 RATIO
VLDL: 10 mg/dL (ref 0–40)

## 2013-09-20 LAB — PROTIME-INR
INR: 1.07 (ref 0.00–1.49)
Prothrombin Time: 13.9 seconds (ref 11.6–15.2)

## 2013-09-20 LAB — PRO B NATRIURETIC PEPTIDE: Pro B Natriuretic peptide (BNP): 1649 pg/mL — ABNORMAL HIGH (ref 0–450)

## 2013-09-20 MED ORDER — LEVOFLOXACIN IN D5W 750 MG/150ML IV SOLN: 750.0000 mg | INTRAVENOUS | Status: DC

## 2013-09-20 MED ORDER — HYDROCODONE-ACETAMINOPHEN 5-325 MG PO TABS: 1.0000 | ORAL_TABLET | ORAL | Status: DC | PRN

## 2013-09-20 MED ORDER — SODIUM CHLORIDE 0.9 % IV SOLN
250.0000 mL | INTRAVENOUS | Status: DC | PRN
  Administered 2013-09-20: 250 mL via INTRAVENOUS

## 2013-09-20 MED ORDER — FERROUS SULFATE 325 (65 FE) MG PO TABS
325.0000 mg | ORAL_TABLET | Freq: Every day | ORAL | Status: DC
  Administered 2013-09-21 – 2013-09-23 (×3): 325 mg via ORAL
  Filled 2013-09-20 (×3): qty 1

## 2013-09-20 MED ORDER — DONEPEZIL HCL 5 MG PO TABS
10.0000 mg | ORAL_TABLET | Freq: Every day | ORAL | Status: DC
  Administered 2013-09-20 – 2013-09-23 (×4): 10 mg via ORAL
  Filled 2013-09-20 (×4): qty 2

## 2013-09-20 MED ORDER — IPRATROPIUM-ALBUTEROL 0.5-2.5 (3) MG/3ML IN SOLN
3.0000 mL | Freq: Once | RESPIRATORY_TRACT | Status: AC
  Administered 2013-09-20: 3 mL via RESPIRATORY_TRACT
  Filled 2013-09-20: qty 3

## 2013-09-20 MED ORDER — ASPIRIN EC 81 MG PO TBEC
81.0000 mg | DELAYED_RELEASE_TABLET | Freq: Every day | ORAL | Status: DC
  Administered 2013-09-20 – 2013-09-23 (×4): 81 mg via ORAL
  Filled 2013-09-20 (×4): qty 1

## 2013-09-20 MED ORDER — FUROSEMIDE 10 MG/ML IJ SOLN
20.0000 mg | Freq: Every day | INTRAMUSCULAR | Status: DC
Start: 2013-09-20 — End: 2013-09-22
  Administered 2013-09-20 – 2013-09-22 (×3): 20 mg via INTRAVENOUS
  Filled 2013-09-20 (×3): qty 2

## 2013-09-20 MED ORDER — FLUTICASONE PROPIONATE HFA 44 MCG/ACT IN AERO
2.0000 | INHALATION_SPRAY | Freq: Two times a day (BID) | RESPIRATORY_TRACT | Status: DC
  Administered 2013-09-20 – 2013-09-23 (×6): 2 via RESPIRATORY_TRACT
  Filled 2013-09-20: qty 10.6

## 2013-09-20 MED ORDER — SODIUM CHLORIDE 0.9 % IJ SOLN: 3.0000 mL | INTRAMUSCULAR | Status: DC | PRN

## 2013-09-20 MED ORDER — TRAZODONE HCL 50 MG PO TABS: 25.0000 mg | ORAL_TABLET | Freq: Every evening | ORAL | Status: DC | PRN

## 2013-09-20 MED ORDER — ACETAMINOPHEN 650 MG RE SUPP: 650.0000 mg | Freq: Four times a day (QID) | RECTAL | Status: DC | PRN

## 2013-09-20 MED ORDER — ALBUTEROL SULFATE (2.5 MG/3ML) 0.083% IN NEBU
2.5000 mg | INHALATION_SOLUTION | RESPIRATORY_TRACT | Status: DC | PRN
  Administered 2013-09-21 – 2013-09-22 (×3): 2.5 mg via RESPIRATORY_TRACT
  Filled 2013-09-20 (×3): qty 3

## 2013-09-20 MED ORDER — LEVOFLOXACIN IN D5W 750 MG/150ML IV SOLN
750.0000 mg | INTRAVENOUS | Status: DC
  Administered 2013-09-21 – 2013-09-22 (×2): 750 mg via INTRAVENOUS
  Filled 2013-09-20 (×2): qty 150

## 2013-09-20 MED ORDER — ATORVASTATIN CALCIUM 20 MG PO TABS
20.0000 mg | ORAL_TABLET | Freq: Every day | ORAL | Status: DC
  Administered 2013-09-21 – 2013-09-22 (×2): 20 mg via ORAL
  Filled 2013-09-20 (×2): qty 1

## 2013-09-20 MED ORDER — ALUM & MAG HYDROXIDE-SIMETH 200-200-20 MG/5ML PO SUSP: 30.0000 mL | Freq: Four times a day (QID) | ORAL | Status: DC | PRN

## 2013-09-20 MED ORDER — SODIUM CHLORIDE 0.9 % IJ SOLN
3.0000 mL | Freq: Two times a day (BID) | INTRAMUSCULAR | Status: DC
  Administered 2013-09-20 – 2013-09-23 (×4): 3 mL via INTRAVENOUS

## 2013-09-20 MED ORDER — ENOXAPARIN SODIUM 40 MG/0.4ML ~~LOC~~ SOLN
40.0000 mg | SUBCUTANEOUS | Status: DC
Start: 2013-09-20 — End: 2013-09-23
  Administered 2013-09-20 – 2013-09-23 (×4): 40 mg via SUBCUTANEOUS
  Filled 2013-09-20 (×4): qty 0.4

## 2013-09-20 MED ORDER — ONDANSETRON HCL 4 MG/2ML IJ SOLN: 4.0000 mg | Freq: Four times a day (QID) | INTRAMUSCULAR | Status: DC | PRN

## 2013-09-20 MED ORDER — NITROGLYCERIN 0.4 MG SL SUBL: 0.4000 mg | SUBLINGUAL_TABLET | SUBLINGUAL | Status: DC | PRN

## 2013-09-20 MED ORDER — ACETAMINOPHEN 325 MG PO TABS
650.0000 mg | ORAL_TABLET | Freq: Four times a day (QID) | ORAL | Status: DC | PRN
  Administered 2013-09-22: 650 mg via ORAL
  Filled 2013-09-20: qty 2

## 2013-09-20 MED ORDER — ALENDRONATE SODIUM 70 MG PO TABS: 70.0000 mg | ORAL_TABLET | ORAL | Status: DC

## 2013-09-20 MED ORDER — GABAPENTIN 100 MG PO CAPS
100.0000 mg | ORAL_CAPSULE | Freq: Three times a day (TID) | ORAL | Status: DC
  Administered 2013-09-20 – 2013-09-23 (×9): 100 mg via ORAL
  Filled 2013-09-20 (×9): qty 1

## 2013-09-20 MED ORDER — ONDANSETRON HCL 4 MG PO TABS: 4.0000 mg | ORAL_TABLET | Freq: Four times a day (QID) | ORAL | Status: DC | PRN

## 2013-09-20 MED ORDER — LEVOFLOXACIN IN D5W 500 MG/100ML IV SOLN
500.0000 mg | Freq: Once | INTRAVENOUS | Status: DC
  Administered 2013-09-20: 500 mg via INTRAVENOUS
  Filled 2013-09-20: qty 100

## 2013-09-20 NOTE — H&P (Signed)
Patient seen and examined. Came to ED complaining of SOB and has been experiencing some intermittent chest discomfort. Patient found with UTI, fever, and elevated WBC's. Also with CXR demonstrating vascular congestion and emphysematous changes. BNP is elevated. Case has been discussed with NP and agree with findings, assessment and plan on her H&P.  Plan: -IV levaquin for UTI and presumed bronchitis -follow urine cx's -will start IV lasix -cycle troponin -observe on telemetry -PRN antipyretics  -PRN nebulizer treatments and pulmicort -will also add flutter valve -follow clinical evolution/response.  Barton Dubois [phone]

## 2013-09-20 NOTE — H&P (Signed)
[facility] History and Physical  [name] JGO:[id number] DOB: [birth year] DOA: 09/20/2013  Referring physician: ray PCP: Purvis Kilts, MD   Chief Complaint: shortness of breath and chest pain  HPI: [name] is a very pleasant 80 y.o. female with a past medical history that includes CAD with a history of CABG, hypertension, hyperlipidemia and CVA, dementia presents to the emergency department from home with the chief complaint of shortness of breath and chest pain. Initial evaluation reveals Sirs with likely source being urinary tract infection.   Information is obtained from the husband and the daughter who are at the bedside. Information from patient is somewhat unreliable do to her dementia. Husband reports that yesterday patient developed gradual worsening of shortness of breath and complained of left anterior chest pain. He reports that last night she was unable to lie down as when she did her shortness of breath became worse. Associated symptoms include chest pain nonproductive cough and fever. She also complained of dizziness but the family reports that she is on meclizine for chronic dizziness/vertigo.  She denies lower extremity edema. She denies palpitations abdominal pain nausea vomiting diarrhea constipation. She denies any dysuria hematuria frequency or urgency. Husband reports that her appetite waxes and wanes but she has not lost weight unintentionally lately.   Workup in the emergency department reveals initial troponin negative, proBNP 1649. Complete blood count significant for leukocytosis of 13.7 with 89% relative neutrophils. Urinalysis with many bacteria many squama cell too numerous to count WBCs positive nitrites. Chest x-ray Underlying emphysematous change. No edema or consolidation.   She is hemodynamically stable with a rectal temp of 101.6. She is not hypoxic.  In the emergency department she was provided with nebulizer treatment as well as 500 mg  of Levaquin  Review of Systems:  10 point review of systems completed with patient and her family and all systems are negative except as indicated in the history of present illness  Past Medical History  Diagnosis Date  . CAD (coronary artery disease)     CABG surgery in 06/2005 for left main disease; normal EF; negative stress nuclear 08/2008  . Cerebrovascular disease     1993-CVA; 08/2008-mild atherosclerosis without focal stenosis  . Syncope      Negative even recorder dated 3/08  . Tobacco abuse     60 pack years; continuing  . Orthostatic hypotension   . Diverticulosis   . Osteopenia   . GERD (gastroesophageal reflux disease)   . Hypertension   . Mental disorder     mild dementia  . Anemia   . Sinus bradycardia     s/p STJ Assurity dual chamber pacemaker by Dr Rayann Heman 02/2013  . Frequent PVCs   . Family history of anesthesia complication     "makes daughter sick" (02/09/2013)  . High cholesterol   . Asthma   . Chronic bronchitis     "haven't had it in last 2-3 years; used to get it q yr" (02/09/2013)  . Pneumonia     "couple times; several years ago" (02/09/2013)  . TDHRCBUL(845.3)     "sometimes weekly" (02/09/2013)  . Arthritis     "fingers; back" (02/09/2013)  . Chronic back pain greater than 3 months duration     "nerve damage down into left leg" (02/09/2013)  . Frequent UTI   . Dementia     "more than mild; having more short term memory loss recently; on Aricept" (02/09/2013)   Past Surgical History  Procedure Laterality  Date  . Abdominal hysterectomy  1990  . Ventral hernia repair    . Appendectomy    . Tonsillectomy    . Colonoscopy  10/10/2011    Procedure: COLONOSCOPY;  Surgeon: Rogene Houston, MD;  Location: [facility];  Service: Endoscopy;  Laterality: N/A;  1230  . Esophagogastroduodenoscopy  10/10/2011    Procedure: ESOPHAGOGASTRODUODENOSCOPY (EGD);  Surgeon: Rogene Houston, MD;  Location: [facility];  Service: Endoscopy;  Laterality: N/A;  . Pacemaker  insertion  02/09/2013    STJ Assurity pacemaker implanted by Dr Rayann Heman for symptomatic bradycardia  . Hernia repair    . Coronary artery bypass graft  2007    Left main disease  . Cardiac catheterization  2007  . Cataract extraction, bilateral     Social History:  reports that she has quit smoking. Her smoking use included Cigarettes. She has a 30 pack-year smoking history. She has never used smokeless tobacco. She reports that she does not drink alcohol or use illicit drugs. She lives at home with her husband they both reside with their son. She is ambulatory and fairly independent but clearly has some short-term memory issues. Allergies  Allergen Reactions  . Cephalexin Hives  . Contrast Media [Iodinated Diagnostic Agents]     Unknown  . Iohexol     Urticaria, emesis, numbness during IVP performed at [facility] years ago  . Penicillins Hives  . Shellfish Allergy Other (See Comments)    Unknown  . Sulfa Antibiotics Hives    Family History  Problem Relation Age of Onset  . Heart disease    . Arthritis    . Lung disease    . Asthma      Family medical history is reviewed and noncontributory to the admission of this elderly lady Prior to Admission medications   Medication Sig Start Date End Date Taking? Authorizing Provider  acetaminophen (TYLENOL) 325 MG tablet Take 650 mg by mouth every 6 (six) hours as needed for mild pain. For pain   Yes Historical Provider, MD  alendronate (FOSAMAX) 70 MG tablet Take 70 mg by mouth once a week. Take with a full glass of water on an empty stomach.   Yes Historical Provider, MD  aspirin EC 81 MG tablet Take 81 mg by mouth daily.   Yes Historical Provider, MD  atorvastatin (LIPITOR) 10 MG tablet TAKE ONE (1) TABLET EACH DAY   Yes Herminio Commons, MD  Calcium-Vitamin D (CALTRATE 600 PLUS-VIT D PO) Take 2 tablets by mouth daily.    Yes Historical Provider, MD  docusate sodium (COLACE) 100 MG capsule Take 1 capsule (100 mg total) by  mouth 2 (two) times daily. 02/10/13  Yes Brooke O Edmisten, PA-C  donepezil (ARICEPT) 10 MG tablet Take 10 mg by mouth daily.    Yes Historical Provider, MD  ferrous sulfate 325 (65 FE) MG tablet Take 1 tablet (325 mg total) by mouth daily with breakfast. 02/10/13  Yes Brooke O Edmisten, PA-C  gabapentin (NEURONTIN) 100 MG capsule Take 1 capsule (100 mg total) by mouth 3 (three) times daily. 07/15/13  Yes Carole Civil, MD  meclizine (ANTIVERT) 25 MG tablet Take 25 mg by mouth 3 (three) times daily as needed for dizziness.   Yes Historical Provider, MD  nitroGLYCERIN (NITROSTAT) 0.4 MG SL tablet Place 1 tablet (0.4 mg total) under the tongue every 5 (five) minutes as needed for chest pain. 08/14/12  Yes Lendon Colonel, NP  Omega-3 Fatty Acids (  FISH OIL) 1200 MG CAPS Take 1,200 mg by mouth 2 (two) times daily.    Yes Historical Provider, MD  potassium chloride (K-DUR) 10 MEQ tablet Take 2 tablets (20 mEq total) by mouth daily. 02/10/13  Yes Brooke O Edmisten, PA-C  vitamin B-12 (CYANOCOBALAMIN) 1000 MCG tablet Take 1,000 mcg by mouth daily.    Yes Historical Provider, MD   Physical Exam: Filed Vitals:   09/20/13 1011 09/20/13 1028 09/20/13 1048 09/20/13 1324  BP:   138/64 146/70  Pulse:   67 64  Temp: 101.6 F (38.7 C)     TempSrc: Rectal     Resp:   23 26  Height:      Weight:      SpO2:  94% 97% 97%    Wt Readings from Last 3 Encounters:  09/20/13 54.432 kg (120 lb)  09/15/13 56.246 kg (124 lb)  07/23/13 54.885 kg (121 lb)    General:  Appears calm and comfortable Eyes: PERRL, normal lids, irises & conjunctiva ENT: grossly normal hearing, mucous membranes of her mouth are pink slightly dry Neck: no LAD, masses or thyromegaly Cardiovascular: Irregularly irregular  no m/r/g. No LE edema. Respiratory: Normal effort. Breath sounds somewhat distant and coarse throughout. I hear very faint expiratory wheeze on the left otherwise faint rhonchi. I hear no crackles  Abdomen: soft, ntnd  positive bowel sounds throughout no mass organomegaly noted Skin: no rash or induration seen on limited exam Musculoskeletal: grossly normal tone BUE/BLE Psychiatric: grossly normal mood and affect, speech fluent and appropriate Neurologic: grossly non-focal. Speech clear facial symmetry follows commands able to make wants and needs known           Labs on Admission:  Basic Metabolic Panel:  Recent Labs Lab 09/20/13 1042  NA 140  K 4.1  CL 102  CO2 26  GLUCOSE 121*  BUN 14  CREATININE 0.75  CALCIUM 9.2   Liver Function Tests:  Recent Labs Lab 09/20/13 1042  AST 22  ALT 13  ALKPHOS 55  BILITOT 0.4  PROT 7.0  ALBUMIN 3.9   No results found for this basename: LIPASE, AMYLASE,  in the last 168 hours No results found for this basename: AMMONIA,  in the last 168 hours CBC:  Recent Labs Lab 09/20/13 1042  WBC 13.7*  NEUTROABS 12.2*  HGB 14.5  HCT 43.3  MCV 93.7  PLT 165   Cardiac Enzymes:  Recent Labs Lab 09/20/13 1042  TROPONINI <0.30    BNP (last 3 results)  Recent Labs  09/20/13 1042  PROBNP 1649.0*   CBG: No results found for this basename: GLUCAP,  in the last 168 hours  Radiological Exams on Admission: Dg Chest 2 View  09/20/2013   CLINICAL DATA:  Chest pain and difficulty breathing  EXAM: CHEST  2 VIEW  COMPARISON:  February 10, 2013  FINDINGS: There is underlying emphysematous change. There is stable apical pleural thickening bilaterally. There is no edema or consolidation. The heart size is normal. Pulmonary vascularity reflects underlying emphysema. Pacemaker leads are attached to the right atrium and right ventricle. Patient is status post coronary artery bypass grafting. There is no adenopathy. No bone lesions.  IMPRESSION: Underlying emphysematous change. No edema or consolidation. Stable apical pleural thickening bilaterally.   Electronically Signed   By: Lowella Grip M.D.   On: 09/20/2013 12:20    EKG: Independently reviewed. Sinus  tach with PVC  Assessment/Plan Principal Problem:   SIRS (systemic inflammatory response syndrome): Likely related to  urinary tract infection. Will admit for observation. Will continue Levaquin that was started in the emergency department. Will obtain blood cultures and urine culture. Currently she is hemodynamically stable. Will monitor close Active Problems:  UTI (lower urinary tract infection); likely related to decreased by mouth intake. Chart review indicates patient frequently counseled regarding adequate hydration by mouth. See #1. Will obtain urine culture. Will continue Levaquin per pharmacy.  Chest pain: Etiology uncertain. Patient with history of CAD status post CABG and 6 sinus syndrome status post pacemaker. Chart review indicates she was last seen by cardiology in July of this year. Pain mostly resolved at the time of my exam.  EKG with no acute changes. Initial troponin negative. Will monitor on telemetry and cycle cardiac enzymes. Will provide nitroglycerin as needed for any recurring pain. Continue aspirin and statin     Dyspnea: ProBNP elevated. Last echo in January of this year yields EF of 55-60% with grade 2 diastolic dysfunction, moderate left atrial enlargement. Chest x-ray with only emphysematous changes. Given her elevated BNP and orthopnea will provide low dose Lasix. Will monitor intake and output. Daily weights. Breath sounds do sound rhonchus with faint wheeze will also provide nebulizers.  Acute bronchitis: Patient is a former smoker. X-ray with emphysematous changes. Will continue nebulizers. Will continue Levaquin as well. No indication for steroids at this time. Monitor oxygen saturation levels.    CAD (coronary artery disease): Status post CABG. See #1    Sick sinus syndrome: Status post pacemaker. He is seen by Dr. Rayann Heman. Last seen 5/15. Note indicates PVC's noted and Dr Rayann Heman recommending avoidance of ablation for PVC's given age. She was started on Metoprolol  which was later discontinued as she could not tolerate.     Hyperlipidemia: Will check a lipid panel. Continue her statin    GERD (gastroesophageal reflux disease): Appears stable at baseline.      Essential hypertension: Controlled without antihypertensive medications. Will monitor    Anemia: Chronic. Stable  Dizziness/vertigo/orthostatic hypotension: chart review indicates chronic and mostly postural in nature. Has been on meclizine and neurology recommends weaning. Proper hydration and increased salt intake recommended. Will check orthostatics. Request PT eval. Hold meclizine      Code Status: full DVT Prophylaxis: Family Communication: husband and daughter at bedside Disposition Plan: home when ready  Time spent: 51 minutes  [facility] Pager [phone]  **Disclaimer: This note may have been dictated with voice recognition software. Similar sounding words can inadvertently be transcribed and this note may contain transcription errors which may not have been corrected upon publication of note.**

## 2013-09-20 NOTE — ED Provider Notes (Signed)
CSN: [id number]     Arrival date & time 09/20/13  3295 History  This chart was scribed for Kim Pollack, MD by Lowella Petties, ED Scribe. The patient was seen in room APA05/APA05. Patient's care was started at 9:56 AM.   Chief Complaint  Patient presents with  . Chest Pain   Patient is a 80 y.o. female presenting with chest pain. The history is provided by the patient and the spouse. No language interpreter was used.  Chest Pain Pain location:  Substernal area Pain radiates to:  Does not radiate Pain radiates to the back: no   Pain severity:  Moderate Onset quality:  Gradual Duration:  2 months Timing:  Constant Progression:  Unchanged Chronicity:  Chronic Relieved by:  Nothing Worsened by:  Nothing tried Ineffective treatments:  None tried Associated symptoms: cough, dizziness and nausea    HPI Comments: Level 5 Caveat: Dementia  [name] is a 80 y.o. female with a history of dementia and CAD who presents to the Emergency Department complaining of constant, central chest pain with associated nausea, dizziness, and SOB onset this morning. Her husband reports that he brought her to [facility] cardiology group this morning, and they told him to bring her here.  She reports a history of chronic, daily, chest pain, but reports that she does not usually have as much nausea and dizziness.  She reports difficulty ambulating without a cane or walker. She also reports a cough productive of yellow sputum for the past week. She denies fever. She reports a history of CABG in 2007. She does not smoke.   Past Medical History  Diagnosis Date  . CAD (coronary artery disease)     CABG surgery in 06/2005 for left main disease; normal EF; negative stress nuclear 08/2008  . Cerebrovascular disease     1993-CVA; 08/2008-mild atherosclerosis without focal stenosis  . Syncope      Negative even recorder dated 3/08  . Tobacco abuse     60 pack years; continuing  . Orthostatic hypotension   .  Diverticulosis   . Osteopenia   . GERD (gastroesophageal reflux disease)   . Hypertension   . Mental disorder     mild dementia  . Anemia   . Sinus bradycardia     s/p STJ Assurity dual chamber pacemaker by Dr Rayann Heman 02/2013  . Frequent PVCs   . Family history of anesthesia complication     "makes daughter sick" (02/09/2013)  . High cholesterol   . Asthma   . Chronic bronchitis     "haven't had it in last 2-3 years; used to get it q yr" (02/09/2013)  . Pneumonia     "couple times; several years ago" (02/09/2013)  . JOACZYSA(630.1)     "sometimes weekly" (02/09/2013)  . Arthritis     "fingers; back" (02/09/2013)  . Chronic back pain greater than 3 months duration     "nerve damage down into left leg" (02/09/2013)  . Frequent UTI   . Dementia     "more than mild; having more short term memory loss recently; on Aricept" (02/09/2013)   Past Surgical History  Procedure Laterality Date  . Abdominal hysterectomy  1990  . Ventral hernia repair    . Appendectomy    . Tonsillectomy    . Colonoscopy  10/10/2011    Procedure: COLONOSCOPY;  Surgeon: Rogene Houston, MD;  Location: [facility];  Service: Endoscopy;  Laterality: N/A;  1230  . Esophagogastroduodenoscopy  10/10/2011  Procedure: ESOPHAGOGASTRODUODENOSCOPY (EGD);  Surgeon: Rogene Houston, MD;  Location: [facility];  Service: Endoscopy;  Laterality: N/A;  . Pacemaker insertion  02/09/2013    STJ Assurity pacemaker implanted by Dr Rayann Heman for symptomatic bradycardia  . Hernia repair    . Coronary artery bypass graft  2007    Left main disease  . Cardiac catheterization  2007  . Cataract extraction, bilateral     Family History  Problem Relation Age of Onset  . Heart disease    . Arthritis    . Lung disease    . Asthma     History  Substance Use Topics  . Smoking status: Former Smoker -- 0.50 packs/day for 60 years    Types: Cigarettes  . Smokeless tobacco: Never Used     Comment: 02/09/2013 "quit smoking in ~ 2009"  . Alcohol  Use: No   OB History   Grav Para Term Preterm Abortions TAB SAB Ect Mult Living                 Review of Systems  Respiratory: Positive for cough.   Cardiovascular: Positive for chest pain.  Gastrointestinal: Positive for nausea.  Neurological: Positive for dizziness.  All other systems reviewed and are negative.  Allergies  Cephalexin; Contrast media; Iohexol; Penicillins; Shellfish allergy; and Sulfa antibiotics  Home Medications   Prior to Admission medications   Medication Sig Start Date End Date Taking? Authorizing Provider  acetaminophen (TYLENOL) 325 MG tablet Take 650 mg by mouth every 6 (six) hours as needed for mild pain. For pain    Historical Provider, MD  alendronate (FOSAMAX) 70 MG tablet Take 70 mg by mouth once a week. Take with a full glass of water on an empty stomach.    Historical Provider, MD  aspirin EC 81 MG tablet Take 81 mg by mouth daily.    Historical Provider, MD  atorvastatin (LIPITOR) 10 MG tablet TAKE ONE (1) [place], MD  Calcium-Vitamin D (CALTRATE 600 PLUS-VIT D PO) Take 2 tablets by mouth daily.     Historical Provider, MD  docusate sodium (COLACE) 100 MG capsule Take 1 capsule (100 mg total) by mouth 2 (two) times daily. 02/10/13   Brooke O Edmisten, PA-C  donepezil (ARICEPT) 10 MG tablet Take 10 mg by mouth daily.     Historical Provider, MD  ferrous sulfate 325 (65 FE) MG tablet Take 1 tablet (325 mg total) by mouth daily with breakfast. 02/10/13   Azzie Roup Edmisten, PA-C  gabapentin (NEURONTIN) 100 MG capsule Take 1 capsule (100 mg total) by mouth 3 (three) times daily. 07/15/13   Carole Civil, MD  meclizine (ANTIVERT) 25 MG tablet Take 25 mg by mouth 3 (three) times daily as needed for dizziness.    Historical Provider, MD  nitroGLYCERIN (NITROSTAT) 0.4 MG SL tablet Place 1 tablet (0.4 mg total) under the tongue every 5 (five) minutes as needed for chest pain. 08/14/12   Lendon Colonel, NP  Omega-3 Fatty Acids  (FISH OIL) 1200 MG CAPS Take 1,200 mg by mouth 2 (two) times daily.     Historical Provider, MD  potassium chloride (K-DUR) 10 MEQ tablet Take 2 tablets (20 mEq total) by mouth daily. 02/10/13   Brooke O Edmisten, PA-C  vitamin B-12 (CYANOCOBALAMIN) 1000 MCG tablet Take 1,000 mcg by mouth daily.     Historical Provider, MD   Triage Vitals: Pulse 93  Temp(Src) 98.4 F (36.9 C) (  Oral)  Resp 20  Ht 5\' 4"  (1.626 m)  Wt 120 lb (54.432 kg)  BMI 20.59 kg/m2  SpO2 97% Physical Exam  Nursing note and vitals reviewed. Constitutional: She is oriented to person, place, and time. She appears well-developed and well-nourished. No distress.  HENT:  Head: Normocephalic and atraumatic.  Right Ear: External ear normal.  Left Ear: External ear normal.  Nose: Nose normal.  Eyes: EOM are normal. Pupils are equal, round, and reactive to light.  Neck: Normal range of motion. Neck supple.  Pulmonary/Chest: Effort normal. She has rhonchi (right base). She has rales (right base).  Musculoskeletal: Normal range of motion.  Neurological: She is alert and oriented to person, place, and time. She exhibits normal muscle tone. Coordination normal.  Over reaching on FTN. Trouble attending and concentrating.   Skin: Skin is warm and dry.  Psychiatric: She has a normal mood and affect. Her behavior is normal. Thought content normal.    ED Course  Procedures (including critical care time) DIAGNOSTIC STUDIES: Oxygen Saturation is 97% on room air, normal by my interpretation.    COORDINATION OF CARE: 10:06 AM-Discussed treatment plan which includes lab work with pt at bedside and pt agreed to plan.   Labs Review Labs Reviewed  CBC WITH DIFFERENTIAL - Abnormal; Notable for the following:    WBC 13.7 (*)    Neutrophils Relative % 89 (*)    Neutro Abs 12.2 (*)    Lymphocytes Relative 5 (*)    Lymphs Abs 0.6 (*)    All other components within normal limits  COMPREHENSIVE METABOLIC PANEL - Abnormal; Notable for  the following:    Glucose, Bld 121 (*)    GFR calc non Af Amer 75 (*)    GFR calc Af Amer 87 (*)    All other components within normal limits  PRO B NATRIURETIC PEPTIDE - Abnormal; Notable for the following:    Pro B Natriuretic peptide (BNP) 1649.0 (*)    All other components within normal limits  URINALYSIS, ROUTINE W REFLEX MICROSCOPIC - Abnormal; Notable for the following:    Hgb urine dipstick MODERATE (*)    Bilirubin Urine SMALL (*)    Ketones, ur TRACE (*)    Protein, ur 100 (*)    Nitrite POSITIVE (*)    Leukocytes, UA TRACE (*)    All other components within normal limits  URINE MICROSCOPIC-ADD ON - Abnormal; Notable for the following:    Squamous Epithelial / LPF MANY (*)    Bacteria, UA MANY (*)    All other components within normal limits  TROPONIN I  PROTIME-INR    Imaging Review Dg Chest 2 View  09/20/2013   CLINICAL DATA:  Chest pain and difficulty breathing  EXAM: CHEST  2 VIEW  COMPARISON:  February 10, 2013  FINDINGS: There is underlying emphysematous change. There is stable apical pleural thickening bilaterally. There is no edema or consolidation. The heart size is normal. Pulmonary vascularity reflects underlying emphysema. Pacemaker leads are attached to the right atrium and right ventricle. Patient is status post coronary artery bypass grafting. There is no adenopathy. No bone lesions.  IMPRESSION: Underlying emphysematous change. No edema or consolidation. Stable apical pleural thickening bilaterally.   Electronically Signed   By: Lowella Grip M.D.   On: 09/20/2013 12:20     EKG Interpretation   Date/Time:  Monday September 20 2013 09:59:57 EDT Ventricular Rate:  110 PR Interval:  200 QRS Duration: 84 QT Interval:  358 QTC  Calculation: 484 R Axis:   83 Text Interpretation:  Sinus tachycardia Premature ventricular complexes  Poor data quality in current ECG precludes serial comparison Confirmed by  Rodert Hinch MD, Alam Guterrez [phone]) on 09/20/2013 10:15:20 AM      Filed Vitals:   09/20/13 1003 09/20/13 1011 09/20/13 1028 09/20/13 1048  BP: 145/72   138/64  Pulse:    67  Temp:  101.6 F (38.7 C)    TempSrc:  Rectal    Resp:    23  Height:      Weight:      SpO2:   94% 97%    MDM   Final diagnoses:  Chest pain, unspecified chest pain type  Bronchitis, asthmatic, unspecified asthma severity, with acute exacerbation  UTI (lower urinary tract infection)    80 y.o. Female with mild dementia, h.o. Cad, tobacco abuse presents today with chest pain and dyspnea.  EKG of poor quality but is not acutely ischemic.  Patient not currently complaining of pain.  Pain duration and quality difficult to assess due to memory issues. Plan observation for further evaluation. Patient has elevated bnp but no radiologic fluid overload or volume overload on physical exam. Patient has some cough with change in color of aputum with some leukocytosis but no infiltrate on cxr.  Plan antibiotics.  No wheezing noted.  Patient with urinalysis significant for tntc wbc.     Patient care discussed with Dr. Dyann Kief.  Urine culture entered.     I personally performed the services described in this documentation, which was scribed in my presence. The recorded information has been reviewed and considered.   Kim Pollack, MD 09/20/13 1318

## 2013-09-20 NOTE — Progress Notes (Signed)
ANTIBIOTIC CONSULT NOTE - INITIAL  Pharmacy Consult for Levaquin Indication: UTI  Allergies  Allergen Reactions  . Cephalexin Hives  . Contrast Media [Iodinated Diagnostic Agents]     Unknown  . Iohexol     Urticaria, emesis, numbness during IVP performed at [facility] years ago  . Penicillins Hives  . Shellfish Allergy Other (See Comments)    Unknown  . Sulfa Antibiotics Hives   Patient Measurements: Height: 5\' 4"  (162.6 cm) Weight: 120 lb (54.432 kg) IBW/kg (Calculated) : 54.7  Vital Signs: Temp: 101.6 F (38.7 C) (09/14 1011) Temp src: Oral (09/14 1448) BP: 110/60 mmHg (09/14 1448) Pulse Rate: 67 (09/14 1448)  Labs:  Recent Labs  09/20/13 1042  WBC 13.7*  HGB 14.5  PLT 165  CREATININE 0.75   Estimated Creatinine Clearance: 44.2 ml/min (by C-G formula based on Cr of 0.75). No results found for this basename: VANCOTROUGH, VANCOPEAK, VANCORANDOM, GENTTROUGH, GENTPEAK, GENTRANDOM, TOBRATROUGH, TOBRAPEAK, TOBRARND, AMIKACINPEAK, AMIKACINTROU, AMIKACIN,  in the last 72 hours   Microbiology: No results found for this or any previous visit (from the past 720 hour(s)).  Medical History: Past Medical History  Diagnosis Date  . CAD (coronary artery disease)     CABG surgery in 06/2005 for left main disease; normal EF; negative stress nuclear 08/2008  . Cerebrovascular disease     1993-CVA; 08/2008-mild atherosclerosis without focal stenosis  . Syncope      Negative even recorder dated 3/08  . Tobacco abuse     60 pack years; continuing  . Orthostatic hypotension   . Diverticulosis   . Osteopenia   . GERD (gastroesophageal reflux disease)   . Hypertension   . Mental disorder     mild dementia  . Anemia   . Sinus bradycardia     s/p STJ Assurity dual chamber pacemaker by Dr Rayann Heman 02/2013  . Frequent PVCs   . Family history of anesthesia complication     "makes daughter sick" (02/09/2013)  . High cholesterol   . Asthma   . Chronic bronchitis    "haven't had it in last 2-3 years; used to get it q yr" (02/09/2013)  . Pneumonia     "couple times; several years ago" (02/09/2013)  . ZJQBHALP(379.0)     "sometimes weekly" (02/09/2013)  . Arthritis     "fingers; back" (02/09/2013)  . Chronic back pain greater than 3 months duration     "nerve damage down into left leg" (02/09/2013)  . Frequent UTI   . Dementia     "more than mild; having more short term memory loss recently; on Aricept" (02/09/2013)   Anti-infectives   Start     Dose/Rate Route Frequency Ordered Stop   09/21/13 1600  levofloxacin (LEVAQUIN) IVPB 750 mg     750 mg 100 mL/hr over 90 Minutes Intravenous Every 24 hours 09/20/13 1502     09/20/13 1600  levofloxacin (LEVAQUIN) IVPB 750 mg  Status:  Discontinued     750 mg 100 mL/hr over 90 Minutes Intravenous Every 24 hours 09/20/13 1500 09/20/13 1502   09/20/13 1300  levofloxacin (LEVAQUIN) IVPB 500 mg  Status:  Discontinued     500 mg 100 mL/hr over 60 Minutes Intravenous  Once 09/20/13 1247 09/20/13 1448     Assessment: 80yo female admitted with SOB and CP.  Initial eval reveals SIRS with likely source being UTI.  Asked to initiate Levaquin - patient received 500mg  on admission.  Goal of Therapy:  Eradicate infection.  Plan:  Levaquin 750mg  IV q24hrs Switch to PO when improved / appropriate  Hart Robinsons A 09/20/2013,3:00 PM

## 2013-09-20 NOTE — Progress Notes (Signed)
PHARMACIST - PHYSICIAN COMMUNICATION  CONCERNING: P&T Medication Policy Regarding Oral Bisphosphonates  RECOMMENDATION: Your order for alendronate (Fosamax), ibandronate (Boniva), or risedronate (Actonel) has been discontinued at this time.  If the patient's post-hospital medical condition warrants safe use of this class of drugs, please resume the pre-hospital regimen upon discharge.  DESCRIPTION:  Alendronate (Fosamax), ibandronate (Boniva), and risedronate (Actonel) can cause severe esophageal erosions in patients who are unable to remain upright at least 30 minutes after taking this medication.   Since brief interruptions in therapy are thought to have minimal impact on bone mineral density, the [facility] has established that bisphosphonate orders should be routinely discontinued during hospitalization.   To override this safety policy and permit administration of Boniva, Fosamax, or Actonel in the hospital, prescribers must write "DO NOT HOLD" in the comments section when placing the order for this class of medications.  Thanks, Pharmacy

## 2013-09-20 NOTE — ED Notes (Signed)
Hospitalist and lab tech at bedside at this time.

## 2013-09-20 NOTE — ED Notes (Signed)
Pt reports woke up with chest pain and nausea and dizziness.  Reports has chest pain every day but usually isnt dizzy or nauseated.  Pt also reports SOB.  Pt also reports productive cough with yellow sputum for past few days, denies fever.

## 2013-09-20 NOTE — ED Notes (Signed)
Waiting on lab to get blood cultures before starting antibiotics

## 2013-09-21 DIAGNOSIS — Z8673 Personal history of transient ischemic attack (TIA), and cerebral infarction without residual deficits: Secondary | ICD-10-CM | POA: Diagnosis not present

## 2013-09-21 DIAGNOSIS — J209 Acute bronchitis, unspecified: Secondary | ICD-10-CM | POA: Diagnosis present

## 2013-09-21 DIAGNOSIS — Z66 Do not resuscitate: Secondary | ICD-10-CM | POA: Diagnosis present

## 2013-09-21 DIAGNOSIS — E876 Hypokalemia: Secondary | ICD-10-CM | POA: Diagnosis present

## 2013-09-21 DIAGNOSIS — Z7982 Long term (current) use of aspirin: Secondary | ICD-10-CM | POA: Diagnosis not present

## 2013-09-21 DIAGNOSIS — I10 Essential (primary) hypertension: Secondary | ICD-10-CM | POA: Diagnosis present

## 2013-09-21 DIAGNOSIS — F0390 Unspecified dementia without behavioral disturbance: Secondary | ICD-10-CM | POA: Diagnosis present

## 2013-09-21 DIAGNOSIS — R509 Fever, unspecified: Secondary | ICD-10-CM | POA: Diagnosis present

## 2013-09-21 DIAGNOSIS — Z825 Family history of asthma and other chronic lower respiratory diseases: Secondary | ICD-10-CM | POA: Diagnosis not present

## 2013-09-21 DIAGNOSIS — M129 Arthropathy, unspecified: Secondary | ICD-10-CM | POA: Diagnosis present

## 2013-09-21 DIAGNOSIS — E7800 Pure hypercholesterolemia, unspecified: Secondary | ICD-10-CM | POA: Diagnosis present

## 2013-09-21 DIAGNOSIS — D649 Anemia, unspecified: Secondary | ICD-10-CM | POA: Diagnosis present

## 2013-09-21 DIAGNOSIS — Z23 Encounter for immunization: Secondary | ICD-10-CM | POA: Diagnosis not present

## 2013-09-21 DIAGNOSIS — I5033 Acute on chronic diastolic (congestive) heart failure: Secondary | ICD-10-CM

## 2013-09-21 DIAGNOSIS — Z79899 Other long term (current) drug therapy: Secondary | ICD-10-CM | POA: Diagnosis not present

## 2013-09-21 DIAGNOSIS — I2510 Atherosclerotic heart disease of native coronary artery without angina pectoris: Secondary | ICD-10-CM | POA: Diagnosis present

## 2013-09-21 DIAGNOSIS — Z951 Presence of aortocoronary bypass graft: Secondary | ICD-10-CM | POA: Diagnosis not present

## 2013-09-21 DIAGNOSIS — A419 Sepsis, unspecified organism: Secondary | ICD-10-CM | POA: Diagnosis present

## 2013-09-21 DIAGNOSIS — Z950 Presence of cardiac pacemaker: Secondary | ICD-10-CM | POA: Diagnosis not present

## 2013-09-21 DIAGNOSIS — I951 Orthostatic hypotension: Secondary | ICD-10-CM | POA: Diagnosis present

## 2013-09-21 DIAGNOSIS — Z87440 Personal history of urinary (tract) infections: Secondary | ICD-10-CM | POA: Diagnosis not present

## 2013-09-21 DIAGNOSIS — K219 Gastro-esophageal reflux disease without esophagitis: Secondary | ICD-10-CM | POA: Diagnosis present

## 2013-09-21 DIAGNOSIS — M549 Dorsalgia, unspecified: Secondary | ICD-10-CM | POA: Diagnosis present

## 2013-09-21 DIAGNOSIS — I495 Sick sinus syndrome: Secondary | ICD-10-CM

## 2013-09-21 DIAGNOSIS — E785 Hyperlipidemia, unspecified: Secondary | ICD-10-CM | POA: Diagnosis present

## 2013-09-21 DIAGNOSIS — N390 Urinary tract infection, site not specified: Secondary | ICD-10-CM | POA: Diagnosis present

## 2013-09-21 DIAGNOSIS — G8929 Other chronic pain: Secondary | ICD-10-CM | POA: Diagnosis present

## 2013-09-21 LAB — URINE CULTURE

## 2013-09-21 LAB — TSH: TSH: 2.68 u[IU]/mL (ref 0.350–4.500)

## 2013-09-21 LAB — BASIC METABOLIC PANEL
Anion gap: 12 (ref 5–15)
BUN: 17 mg/dL (ref 6–23)
CHLORIDE: 105 meq/L (ref 96–112)
CO2: 25 meq/L (ref 19–32)
Calcium: 8.6 mg/dL (ref 8.4–10.5)
Creatinine, Ser: 0.77 mg/dL (ref 0.50–1.10)
GFR calc Af Amer: 86 mL/min — ABNORMAL LOW (ref >90)
GFR, EST NON AFRICAN AMERICAN: 74 mL/min — AB (ref >90)
GLUCOSE: 83 mg/dL (ref 70–99)
Potassium: 3.5 mEq/L — ABNORMAL LOW (ref 3.7–5.3)
SODIUM: 142 meq/L (ref 137–147)

## 2013-09-21 LAB — CBC
HEMATOCRIT: 42 % (ref 36.0–46.0)
Hemoglobin: 14 g/dL (ref 12.0–15.0)
MCH: 31 pg (ref 26.0–34.0)
MCHC: 33.3 g/dL (ref 30.0–36.0)
MCV: 92.9 fL (ref 78.0–100.0)
PLATELETS: 147 10*3/uL — AB (ref 150–400)
RBC: 4.52 MIL/uL (ref 3.87–5.11)
RDW: 12.7 % (ref 11.5–15.5)
WBC: 9.3 10*3/uL (ref 4.0–10.5)

## 2013-09-21 MED ORDER — MECLIZINE HCL 12.5 MG PO TABS
25.0000 mg | ORAL_TABLET | Freq: Two times a day (BID) | ORAL | Status: DC | PRN
  Administered 2013-09-22: 25 mg via ORAL
  Filled 2013-09-21 (×2): qty 2

## 2013-09-21 MED ORDER — FAMOTIDINE 20 MG PO TABS
20.0000 mg | ORAL_TABLET | Freq: Every day | ORAL | Status: DC
  Administered 2013-09-22 – 2013-09-23 (×2): 20 mg via ORAL
  Filled 2013-09-21 (×2): qty 1

## 2013-09-21 MED ORDER — POTASSIUM CHLORIDE CRYS ER 20 MEQ PO TBCR
40.0000 meq | EXTENDED_RELEASE_TABLET | Freq: Once | ORAL | Status: AC
  Administered 2013-09-21: 40 meq via ORAL
  Filled 2013-09-21: qty 2

## 2013-09-21 NOTE — Evaluation (Signed)
Physical Therapy Evaluation Patient Details Name: [name] MRN: [id number] DOB: [birth year] Today's Date: 09/21/2013   History of Present Illness  Pt is an 80 year old female admitted with SIRS secondary to a UTI.  She has a history of stroke and mild dementia, and reportedly has chronic vertigo for which she takes Meclizine.  She initially was admitted with dyspnea and she is currently on 1.5 L O2/min.  Pt lives with her husband and son and ambulates with a quad cane.  She is normally independent with all ADLs. She has minimal vision in the left eye.  Clinical Impression   Pt was alert and fully cooperative at the time of evaluation.  She had no pain and was on 1.5 L O2 with 97% O2 sat.  She reports feeling "lightheaded" when in the upright position. She did display generalized deconditioning and still needs supplemental O2 to maintain O2 sats above 90%.  At rest, sat=94% without O2 but with gait her O2 sat=85%.  She currently requires a walker for safe gait.  I am recommending HHPT at d/c.  Her daughter states that pt is scheduled for vestibular rehabilitation on 10-05-13.  Because of pt's dementia, we will defer any vestibular eval until then where there can be much more follow-up.    Follow Up Recommendations Home health PT    Equipment Recommendations  None recommended by PT    Recommendations for Other Services   none    Precautions / Restrictions Precautions Precautions: Fall Restrictions Weight Bearing Restrictions: No      Mobility  Bed Mobility Overal bed mobility: Modified Independent                Transfers Overall transfer level: Modified independent Equipment used: Rolling walker (2 wheeled)             General transfer comment: pt was cautioned to transfer slowly and pause after each change of position  Ambulation/Gait Ambulation/Gait assistance: Min guard Ambulation Distance (Feet): 50 Feet Assistive device: Rolling walker (2 wheeled) Gait  Pattern/deviations: Trunk flexed   Gait velocity interpretation: at or above normal speed for age/gender General Gait Details: pt normally ambulates with a quad cane but I had her use a walker due to generalized deconditioning   Stairs            Wheelchair Mobility    Modified Rankin (Stroke Patients Only)       Balance Overall balance assessment: No apparent balance deficits (not formally assessed)                                           Pertinent Vitals/Pain Pain Assessment: No/denies pain    Home Living Family/patient expects to be discharged to:: Private residence Living Arrangements: Spouse/significant other;Children Available Help at Discharge: Available 24 hours/day;Family Type of Home: House Home Access: Stairs to enter Entrance Stairs-Rails: Psychiatric nurse of Steps: 8 Home Layout: Two level;Laundry or work area in [place]: Environmental consultant - 2 wheels;Cane - quad;Shower seat Additional Comments: pt has access to a w/c    Prior Function Level of Independence: Independent with assistive device(s)               Hand Dominance   Dominant Hand: Right    Extremity/Trunk Assessment   Upper Extremity Assessment: Overall WFL for tasks assessed  Lower Extremity Assessment: Generalized weakness      Cervical / Trunk Assessment: Normal  Communication   Communication: No difficulties  Cognition Arousal/Alertness: Awake/alert Behavior During Therapy: WFL for tasks assessed/performed Overall Cognitive Status: Within Functional Limits for tasks assessed                      General Comments      Exercises General Exercises - Lower Extremity Ankle Circles/Pumps: AROM;Both;10 reps;Supine Heel Slides: AROM;Both;10 reps;Supine Hip ABduction/ADduction: AROM;Both;10 reps;Supine      Assessment/Plan    PT Assessment Patient needs continued PT services  PT Diagnosis Difficulty  walking;Generalized weakness   PT Problem List Decreased activity tolerance;Decreased mobility;Cardiopulmonary status limiting activity  PT Treatment Interventions Gait training;Functional mobility training;Therapeutic exercise   PT Goals (Current goals can be found in the Care Plan section) Acute Rehab PT Goals Patient Stated Goal: return to gait with quad cane PT Goal Formulation: With patient/family Time For Goal Achievement: 10/05/13 Potential to Achieve Goals: Good    Frequency Min 3X/week   Barriers to discharge        Co-evaluation               End of Session Equipment Utilized During Treatment: Gait belt Activity Tolerance: Patient tolerated treatment well Patient left: in chair;with call bell/phone within reach;with chair alarm set;with family/visitor present      Functional Assessment Tool Used: clinical judgement Functional Limitation: Mobility: Walking and moving around Mobility: Walking and Moving Around Current Status [phone]): At least 20 percent but less than 40 percent impaired, limited or restricted Mobility: Walking and Moving Around Goal Status [phone]): At least 1 percent but less than 20 percent impaired, limited or restricted    Time: 1130-1206 PT Time Calculation (min): 36 min   Charges:   PT Evaluation $Initial PT Evaluation Tier I: 1 Procedure     PT G Codes:   Functional Assessment Tool Used: clinical judgement Functional Limitation: Mobility: Walking and moving around    Hart 09/21/2013, 12:43 PM

## 2013-09-21 NOTE — Progress Notes (Signed)
[facility] PROGRESS NOTE  SHANDEL BUSIC XBM:[id number] DOB: [birth year] DOA: 09/20/2013 PCP: Purvis Kilts, MD  Assessment/Plan: Principal Problem:  SIRS (systemic inflammatory response syndrome): Likely related to urinary tract infection and bronchitis. Levaquin day #2/10. Await blood culture, urine culture and sputum culture. She remains hemodynamically stable, afebrile and non-toxic appearing. WBCs back to within normal limits.  UTI (lower urinary tract infection): See #1. Await obtained urine culture. Levaquin day #2/10.  Chest pain: Etiology uncertain. Resolved now. Troponin negative x3.  Patient with history of CAD status post CABG and sick sinus syndrome status post pacemaker. Chart review indicates she was last seen by cardiology in July of this year. EKG with no acute changes. No events on tele negative troponin.  Continue aspirin and statin   Dyspnea: resolved this am.  ProBNP elevated. Last echo in January of this year yields EF of 55-60% with grade 2 diastolic dysfunction, moderate left atrial enlargement. Chest x-ray with only emphysematous changes. Continue  low dose Lasix. Volume status -160cc. [id number] from 54.3kg yesterday. Breath sounds continue sound rhonchus with faint bibasilar crackles). Continue nebs and flutter valve. Provide IS as she needs a lot of encouragement to cough.    Acute bronchitis: Patient is a former smoker. X-ray with emphysematous changes. Will continue nebulizers and flutter valve. Levaquin as above. No indication for systemic steroids at this time. Oxygen saturation levels >96% on 2L. Will wean oxygen  CAD (coronary artery disease): Status post CABG. See #1   Sick sinus syndrome: Status post pacemaker. He is seen by Dr. Rayann Heman. Last seen 5/15. Note indicates PVC's noted and Dr Rayann Heman recommending avoidance of ablation for PVC's given age. She was started on Metoprolol which was later discontinued as she could not tolerate it 2/2  hypertension.   Hyperlipidemia: Lipid panel within the limits of normal.  Continue statin   GERD (gastroesophageal reflux disease): remains stable; continue famotidine .   Essential hypertension: Controlled without antihypertensive medications at this time. Will monitor  -Lasix IV given for orthopnea shortness of breath helping control his blood pressure at this time as well.   Anemia: Chronic. Stable  -Will monitor hemoglobin -Continue iron supplementation   Dizziness/vertigo/orthostatic hypotension: chart review indicates chronic and mostly postural in nature. Has been on meclizine and neurology recommends weaning her off this medication. Proper hydration and increased salt intake recommended at that time. Await PT eval. will change meclizine to as needed instead of a stable. Do to heart failure patient will need to limit fluid/salt intake. -Will check orthostatic vital signs in the morning   Code Status: DNR Family Communication: son and daughter at bedside Disposition Plan: home hopefully tomorrow.    Consultants:  none  Procedures:  none  Antibiotics:  Levaquin 09/20/13>>  HPI/Subjective: Awake. Denies chest pain, nausea, vomiting, abdominal pain and dysuria. Patient reports significant improvement in her breathing  Objective: Filed Vitals:   09/21/13 0512  BP: 142/69  Pulse: 58  Temp: 98.5 F (36.9 C)  Resp: 18    Intake/Output Summary (Last 24 hours) at 09/21/13 0946 Last data filed at 09/21/13 0906  Gross per 24 hour  Intake    480 ml  Output    600 ml  Net   -120 ml   Filed Weights   09/20/13 0954 09/20/13 1519 09/21/13 0512  Weight: 54.432 kg (120 lb) 54.3 kg (119 lb 11.4 oz) 55.7 kg (122 lb 12.7 oz)    Exam:   General:  Afebrile, NAD; breathing better.  Cardiovascular: irregularly  irregular, no murmurs, gallops or rubs, No LE edema. No JVD appreciated  Respiratory: normal effort with conversation, BS coarse throughout (but especially at the  bases bilaterally); scattered rhonchi   Abdomen: soft, non-distended, +BS, non-tender to palpation  Musculoskeletal: no clubbing or cyanosis   Data Reviewed: Basic Metabolic Panel:  Recent Labs Lab 09/20/13 1042 09/21/13 0551  NA 140 142  K 4.1 3.5*  CL 102 105  CO2 26 25  GLUCOSE 121* 83  BUN 14 17  CREATININE 0.75 0.77  CALCIUM 9.2 8.6   Liver Function Tests:  Recent Labs Lab 09/20/13 1042  AST 22  ALT 13  ALKPHOS 55  BILITOT 0.4  PROT 7.0  ALBUMIN 3.9   CBC:  Recent Labs Lab 09/20/13 1042 09/21/13 0551  WBC 13.7* 9.3  NEUTROABS 12.2*  --   HGB 14.5 14.0  HCT 43.3 42.0  MCV 93.7 92.9  PLT 165 147*   Cardiac Enzymes:  Recent Labs Lab 09/20/13 1042 09/20/13 1603 09/20/13 2142  TROPONINI <0.30 <0.30 <0.30   BNP (last 3 results)  Recent Labs  09/20/13 1042  PROBNP 1649.0*    Studies: Dg Chest 2 View  09/20/2013   CLINICAL DATA:  Chest pain and difficulty breathing  EXAM: CHEST  2 VIEW  COMPARISON:  February 10, 2013  FINDINGS: There is underlying emphysematous change. There is stable apical pleural thickening bilaterally. There is no edema or consolidation. The heart size is normal. Pulmonary vascularity reflects underlying emphysema. Pacemaker leads are attached to the right atrium and right ventricle. Patient is status post coronary artery bypass grafting. There is no adenopathy. No bone lesions.  IMPRESSION: Underlying emphysematous change. No edema or consolidation. Stable apical pleural thickening bilaterally.   Electronically Signed   By: Lowella Grip M.D.   On: 09/20/2013 12:20    Scheduled Meds: . aspirin EC  81 mg Oral Daily  . atorvastatin  20 mg Oral q1800  . donepezil  10 mg Oral Daily  . enoxaparin (LOVENOX) injection  40 mg Subcutaneous Q24H  . ferrous sulfate  325 mg Oral Q breakfast  . fluticasone  2 puff Inhalation BID  . furosemide  20 mg Intravenous Daily  . gabapentin  100 mg Oral TID  . levofloxacin (LEVAQUIN) IV   750 mg Intravenous Q24H  . sodium chloride  3 mL Intravenous Q12H   Continuous Infusions:   Principal Problem:   SIRS (systemic inflammatory response syndrome) Active Problems:   Hyperlipidemia   GERD (gastroesophageal reflux disease)   Sick sinus syndrome   CAD (coronary artery disease)   Essential hypertension   Anemia   Acute bronchitis   Chest pain   UTI (lower urinary tract infection)   Dyspnea   Hypokalemia    Time spent: >30 minutes    [facility] Pager [phone]. If 7PM-7AM, please contact night-coverage at [email or web address], password [facility] 09/21/2013, 9:46 AM  LOS: 1 day

## 2013-09-22 ENCOUNTER — Inpatient Hospital Stay (HOSPITAL_COMMUNITY): Payer: Medicare Other

## 2013-09-22 DIAGNOSIS — N390 Urinary tract infection, site not specified: Secondary | ICD-10-CM

## 2013-09-22 DIAGNOSIS — J209 Acute bronchitis, unspecified: Secondary | ICD-10-CM

## 2013-09-22 LAB — BASIC METABOLIC PANEL
Anion gap: 13 (ref 5–15)
BUN: 14 mg/dL (ref 6–23)
CHLORIDE: 103 meq/L (ref 96–112)
CO2: 24 mEq/L (ref 19–32)
CREATININE: 0.75 mg/dL (ref 0.50–1.10)
Calcium: 8.6 mg/dL (ref 8.4–10.5)
GFR calc Af Amer: 87 mL/min — ABNORMAL LOW (ref >90)
GFR, EST NON AFRICAN AMERICAN: 75 mL/min — AB (ref >90)
Glucose, Bld: 107 mg/dL — ABNORMAL HIGH (ref 70–99)
Potassium: 4 mEq/L (ref 3.7–5.3)
Sodium: 140 mEq/L (ref 137–147)

## 2013-09-22 MED ORDER — MECLIZINE HCL 12.5 MG PO TABS
25.0000 mg | ORAL_TABLET | Freq: Two times a day (BID) | ORAL | Status: DC
  Administered 2013-09-22 – 2013-09-23 (×3): 25 mg via ORAL
  Filled 2013-09-22 (×2): qty 2

## 2013-09-22 MED ORDER — FUROSEMIDE 20 MG PO TABS
20.0000 mg | ORAL_TABLET | Freq: Every day | ORAL | Status: DC
  Administered 2013-09-23: 20 mg via ORAL
  Filled 2013-09-22: qty 1

## 2013-09-22 NOTE — Progress Notes (Signed)
Physical Therapy Treatment Patient Details Name: [name] MRN: [id number] DOB: 1925-11-22 Today's Date: 09/22/2013    History of Present Illness Pt is an 80 year old female admitted with SIRS secondary to a UTI.  She has a history of stroke and mild dementia, and reportedly has chronic vertigo for which she takes Meclizine.  She initially was admitted with dyspnea and she is currently on 1.5 L O2/min.  Pt lives with her husband and son and ambulates with a quad cane.  She is normally independent with all ADLs.    PT Comments    Pt reports not feeling well this morning, complaints of dizziness/chest heaviness/weakness. Per family (daughter present at beginning of treatment session), pt was given lasix and immediately when PT entered room pt requested transfer to [facility].  Noted mild increase in assistance with transfer today, as PT did not have time to place RW in front of patient, and instead family/PT completed transfer with [facility]HA to [facility] and back to bed.  Attempted exercise at EOB after transfer, though pt only able to complete 5 reps prior to lying back down secondary to complaints of "not feeling well".  Rest break given after transfer back to bed, and supine exercises attempted.  Pt continued to have difficulty with exercises in supine reporting increased fatigue and inability to perform increased reps with heels slides >5 reps.  Pt reports it just is "not a good day today".  Attempted rolling in bed to fix bed sheets though pt reports increased complaints of dizziness rolling to the Rt and was unable to roll Lt secondary to dizziness.  Noted hx of dizziness and vestibular evaluation scheduled per chart.  Dizziness possibly related to BPPV, as pt reports most dizziness with lying down and rolling and bending over, and least dizziness with standing/ambulation.  Unable to perform hallpix-dix to confirm in a hospital bed, though recommended to pt/family to go to vestibular evaluation and to learn self  epley manuveur and full vestibular screen.  Recommend continued PT in the hospital to address strengthening, balance, and activity tolerance for improved functional mobility skills  And HHPT for full assessment of functional mobility in the home and vestibular screen if able.    Follow Up Recommendations  Home health PT ((vestibular screen if able, teach self epley possibly))     Equipment Recommendations  None recommended by PT       Precautions / Restrictions Precautions Precautions: Fall Precaution Comments: Dizziness (BPPV??)    Mobility  Bed Mobility Overal bed mobility: Modified Independent                Transfers Overall transfer level: Needs assistance Equipment used: 1 person hand held assist Transfers: Stand Pivot Transfers   Stand pivot transfers: Min guard          Ambulation/Gait             General Gait Details: Unable to assess today secondary to complaints of fatigue.          Cognition Arousal/Alertness: Awake/alert Behavior During Therapy: WFL for tasks assessed/performed Overall Cognitive Status: Within Functional Limits for tasks assessed                      Exercises General Exercises - Lower Extremity Ankle Circles/Pumps: 10 reps;AROM;Both;Supine Quad Sets: 10 reps;PROM;Both;Supine Long Arc Quad: 5 reps;Strengthening;Both;Seated Heel Slides: 5 reps;Strengthening;Both;Supine        Pertinent Vitals/Pain Pain Assessment: No/denies pain  PT Goals (current goals can now be found in the care plan section) Progress towards PT goals: Not progressing toward goals - comment    Frequency  Min 3X/week    PT Plan Current plan remains appropriate       End of Session Equipment Utilized During Treatment: Gait belt Activity Tolerance: Patient limited by fatigue Patient left: in bed;with call bell/phone within reach;with bed alarm set     Time: 1010-1030 PT Time Calculation (min): 20 min  Charges:   $Therapeutic Exercise: 8-22 mins                    [name] 09/22/2013, 10:46 AM

## 2013-09-22 NOTE — Progress Notes (Signed)
Patient seen, independently examined and chart reviewed. I agree with exam, assessment and plan discussed with Dyanne Carrel, NP.  80 year old woman with history of coronary artery disease and chronic chest pain present with chest pain. Subsequently found to have fever, UTI. Admitted for UTI with possible early sepsis.  PMH  Chronic dizziness, lightheaded and vertigo component. Component of orthostatic hypotension.  Dementia  Coronary artery disease  CABG 2007  Tobacco dependence in remission  Subjective: Feeling better today. Breathing better. Eating okay. No pain.  Objective: Afebrile, vital signs stable.  Gen. Alert. Speech fluent and clear. Appears calm and comfortable.  Cardiovascular. Regular rate and rhythm. No murmur, rub or gallop. No lower extremity edema.  Respiratory. Clear to auscultation bilaterally. No wheezes, rales or rhonchi. Normal respiratory effort.   Basic metabolic panel unremarkable. Chest x-ray no acute disease.  UTI possible sepsis on admission. Clinically improved. Afebrile, hemodynamically stable. Urine culture pending.  Suspected acute bronchitis with some shortness of breath and associated chest pain.  Chronic vertigo/dizziness/orthostatic hypotension.  Overall she seems to be improving and discharged home 9/17 is anticipated.  Murray Hodgkins, MD [facility] [phone]

## 2013-09-22 NOTE — Progress Notes (Signed)
Orthostatic vitals will be passed on to daytime shift b/c patient has c/o dizziness, and feeling unstable upon standing.

## 2013-09-22 NOTE — Progress Notes (Addendum)
Patient attempting to get on bsc without assistance.  Patient very sob with wheezing.  Patient stated she felt funny and fuzzy in her head.  Helped patient to bsc and then returned to patient to bed and placed on 02 @ 2l for patient comfort.   Explained to patient because of her difficulty with using the bsc and sob, would be using bed pan for rest of the night.

## 2013-09-22 NOTE — Progress Notes (Signed)
[facility] PROGRESS NOTE  [name] UDJ:[id number] DOB: February 13, 1925 DOA: 09/20/2013 PCP: Purvis Kilts, MD  Assessment/Plan: Principal Problem:  SIRS (systemic inflammatory response syndrome): Likely related to urinary tract infection and bronchitis. Levaquin day #3/10. Blood culture with no growth to date, urine culture with gram neg rods and sputum culture normal flora. Max temp 99, WBCs back to within normal limits.   UTI (lower urinary tract infection): See #1. Levaquin day #3/10. Urine output good.   Chest pain: Etiology uncertain. Resolved. Troponin negative x3. Patient with history of CAD status post CABG and sick sinus syndrome status post pacemaker. Chart review indicates she was last seen by cardiology in July of this year. EKG with no acute changes. No events on tele negative troponin. Continue aspirin and statin   Dyspnea: little improvement. Worsening cough.  ProBNP elevated on admission. Last echo in January of this year yields EF of 55-60% with grade 2 diastolic dysfunction, moderate left atrial enlargement. Chest x-ray with only emphysematous changes. Continue low dose Lasix. Volume status -1.5L. [id number] from 54.3kg 2 days ago. Breath sounds more diminished with faint bibasilar crackles. Continue nebs and flutter valve. Provide IS as she needs a lot of encouragement to cough. Poor cough effort.   Acute bronchitis: see above.  Patient is a former smoker. X-ray with emphysematous changes. Will continue nebulizers and flutter valve. Levaquin as above. Oxygen saturation levels 90-95% o 2L. Is not on oxygen at home. Consider steroids   CAD (coronary artery disease): Status post CABG. See #1   Sick sinus syndrome: Status post pacemaker. He is seen by Dr. Rayann Heman. Last seen 5/15. Note indicates PVC's noted and Dr Rayann Heman recommending avoidance of ablation for PVC's given age. She was started on Metoprolol which was later discontinued as she could not tolerate it due to  hypertension.   Hyperlipidemia: Lipid panel within the limits of normal. Continue statin   GERD (gastroesophageal reflux disease): remains stable; continue famotidine .   Essential hypertension: Controlled without antihypertensive medications at this time. Will monitor. Will change lasix to po.   Anemia: Chronic. Stable.Will monitor hemoglobin and Continue iron supplementation   Dizziness/vertigo/orthostatic hypotension: chart review indicates chronic and mostly postural in nature. Has been on meclizine and neurology recommended weaning her off this medication. Will resume as worsening vertigo.  PT eval reccomending home health PT.  Code Status: full Family Communication: none present Disposition Plan: home hopefully in am   Consultants:  none  Procedures:  none  Antibiotics:  levaquin 09/20/13>>   HPI/Subjective: Awake. Frequent cough with conversation. Denies pain reports feeling sob  Objective: Filed Vitals:   09/22/13 1156  BP:   Pulse: 98  Temp:   Resp:     Intake/Output Summary (Last 24 hours) at 09/22/13 1242 Last data filed at 09/22/13 0800  Gross per 24 hour  Intake    360 ml  Output   1102 ml  Net   -742 ml   Filed Weights   09/20/13 1519 09/21/13 0512 09/22/13 0452  Weight: 54.3 kg (119 lb 11.4 oz) 55.7 kg (122 lb 12.7 oz) 56.3 kg (124 lb 1.9 oz)    Exam:   General:  Well nourished NAD  Cardiovascular: irregularly irregular no MGR No LE edema  Respiratory: mild increased work of breathing with conversation and frequent moist productive cough.   Abdomen: soft +BS non tender to palpation  Musculoskeletal: no clubbing or cyanosis   Data Reviewed: Basic Metabolic Panel:  Recent Labs Lab 09/20/13 1042 09/21/13  0102 09/22/13 0552  NA 140 142 140  K 4.1 3.5* 4.0  CL 102 105 103  CO2 26 25 24   GLUCOSE 121* 83 107*  BUN 14 17 14   CREATININE 0.75 0.77 0.75  CALCIUM 9.2 8.6 8.6   Liver Function Tests:  Recent Labs Lab  09/20/13 1042  AST 22  ALT 13  ALKPHOS 55  BILITOT 0.4  PROT 7.0  ALBUMIN 3.9   No results found for this basename: LIPASE, AMYLASE,  in the last 168 hours No results found for this basename: AMMONIA,  in the last 168 hours CBC:  Recent Labs Lab 09/20/13 1042 09/21/13 0551  WBC 13.7* 9.3  NEUTROABS 12.2*  --   HGB 14.5 14.0  HCT 43.3 42.0  MCV 93.7 92.9  PLT 165 147*   Cardiac Enzymes:  Recent Labs Lab 09/20/13 1042 09/20/13 1603 09/20/13 2142  TROPONINI <0.30 <0.30 <0.30   BNP (last 3 results)  Recent Labs  09/20/13 1042  PROBNP 1649.0*   CBG: No results found for this basename: GLUCAP,  in the last 168 hours  Recent Results (from the past 240 hour(s))  URINE CULTURE     Status: None   Collection Time    09/20/13 10:30 AM      Result Value Ref Range Status   Specimen Description URINE, CLEAN CATCH   Final   Special Requests NONE   Final   Culture  Setup Time     Final   Value: 09/20/2013 22:50     Performed at [facility] Count     Final   Value: >=100,000 COLONIES/ML     Performed at [facility]   Culture     Final   Value: [place]     Performed at [facility]   Report Status PENDING   Incomplete  CULTURE, BLOOD (ROUTINE X 2)     Status: None   Collection Time    09/20/13 10:50 AM      Result Value Ref Range Status   Specimen Description BLOOD LEFT ANTECUBITAL   Final   Special Requests BOTTLES DRAWN AEROBIC AND ANAEROBIC 6CC   Final   Culture NO GROWTH 2 DAYS   Final   Report Status PENDING   Incomplete  CULTURE, BLOOD (ROUTINE X 2)     Status: None   Collection Time    09/20/13 10:50 AM      Result Value Ref Range Status   Specimen Description BLOOD LEFT ARM   Final   Special Requests BOTTLES DRAWN AEROBIC ONLY 4CC   Final   Culture NO GROWTH 2 DAYS   Final   Report Status PENDING   Incomplete  URINE CULTURE     Status: None   Collection Time    09/20/13  5:25 PM      Result Value Ref  Range Status   Specimen Description URINE, CLEAN CATCH   Final   Special Requests NONE   Final   Culture  Setup Time     Final   Value: 09/20/2013 22:50     Performed at [facility] Count     Final   Value: 5,000 COLONIES/ML     Performed at [facility]   Culture     Final   Value: INSIGNIFICANT GROWTH     Performed at [facility]   Report Status 09/21/2013 FINAL   Final  CULTURE, RESPIRATORY (NON-EXPECTORATED)  Status: None   Collection Time    09/21/13  8:15 AM      Result Value Ref Range Status   Specimen Description SPUTUM EXPECTORATED   Final   Special Requests NONE   Final   Gram Stain     Final   Value: MODERATE WBC PRESENT, PREDOMINANTLY PMN     RARE SQUAMOUS EPITHELIAL CELLS PRESENT     FEW GRAM POSITIVE RODS     Performed at [facility]   Culture     Final   Value: NORMAL OROPHARYNGEAL FLORA     Performed at [facility]   Report Status PENDING   Incomplete     Studies: No results found.  Scheduled Meds: . aspirin EC  81 mg Oral Daily  . atorvastatin  20 mg Oral q1800  . donepezil  10 mg Oral Daily  . enoxaparin (LOVENOX) injection  40 mg Subcutaneous Q24H  . famotidine  20 mg Oral Daily  . ferrous sulfate  325 mg Oral Q breakfast  . fluticasone  2 puff Inhalation BID  . furosemide  20 mg Intravenous Daily  . gabapentin  100 mg Oral TID  . levofloxacin (LEVAQUIN) IV  750 mg Intravenous Q24H  . meclizine  25 mg Oral BID  . sodium chloride  3 mL Intravenous Q12H   Continuous Infusions:   Principal Problem:   SIRS (systemic inflammatory response syndrome) Active Problems:   Hyperlipidemia   GERD (gastroesophageal reflux disease)   Sick sinus syndrome   CAD (coronary artery disease)   Essential hypertension   Anemia   Acute bronchitis   Chest pain   UTI (lower urinary tract infection)   Dyspnea   Hypokalemia    Time spent: 35 minutes    [facility] Pager  [phone]. If 7PM-7AM, please contact night-coverage at [email or web address], password [facility] 09/22/2013, 12:42 PM  LOS: 2 days

## 2013-09-23 LAB — URINE CULTURE

## 2013-09-23 LAB — CULTURE, RESPIRATORY W GRAM STAIN: Culture: NORMAL

## 2013-09-23 LAB — CULTURE, RESPIRATORY

## 2013-09-23 MED ORDER — FLUCONAZOLE 100 MG PO TABS
150.0000 mg | ORAL_TABLET | Freq: Every day | ORAL | Status: DC
  Administered 2013-09-23: 150 mg via ORAL
  Filled 2013-09-23: qty 2

## 2013-09-23 MED ORDER — LEVOFLOXACIN 750 MG PO TABS: 750.0000 mg | ORAL_TABLET | ORAL | Status: DC

## 2013-09-23 MED ORDER — FLUCONAZOLE 150 MG PO TABS: 150.0000 mg | ORAL_TABLET | Freq: Every day | ORAL | Status: DC

## 2013-09-23 MED ORDER — LEVOFLOXACIN 750 MG PO TABS
750.0000 mg | ORAL_TABLET | ORAL | Status: DC
  Administered 2013-09-23: 750 mg via ORAL
  Filled 2013-09-23: qty 1

## 2013-09-23 MED ORDER — FLUTICASONE PROPIONATE HFA 44 MCG/ACT IN AERO: 2.0000 | INHALATION_SPRAY | Freq: Two times a day (BID) | RESPIRATORY_TRACT | Status: DC

## 2013-09-23 MED ORDER — FUROSEMIDE 20 MG PO TABS: 20.0000 mg | ORAL_TABLET | Freq: Every day | ORAL | Status: DC

## 2013-09-23 NOTE — Progress Notes (Signed)
Patient discharged in wheelchair with daughter at bedside.  Patient going home and discharge instrcutions given and education of medications reviewed. Patient will follow up with primary care in 10 days.Vitals stable and all belongings taken with family.

## 2013-09-23 NOTE — Discharge Instructions (Signed)
Acute Bronchitis Bronchitis is when the airways that extend from the windpipe into the lungs get red, puffy, and painful (inflamed). Bronchitis often causes thick spit (mucus) to develop. This leads to a cough. A cough is the most common symptom of bronchitis. In acute bronchitis, the condition usually begins suddenly and goes away over time (usually in 2 weeks). Smoking, allergies, and asthma can make bronchitis worse. Repeated episodes of bronchitis may cause more lung problems. HOME CARE  Rest.  Drink enough fluids to keep your pee (urine) clear or pale yellow (unless you need to limit fluids as told by your doctor).  Only take over-the-counter or prescription medicines as told by your doctor.  Avoid smoking and secondhand smoke. These can make bronchitis worse. If you are a smoker, think about using nicotine gum or skin patches. Quitting smoking will help your lungs heal faster.  Reduce the chance of getting bronchitis again by:  Washing your hands often.  Avoiding people with cold symptoms.  Trying not to touch your hands to your mouth, nose, or eyes.  Follow up with your doctor as told. GET HELP IF: Your symptoms do not improve after 1 week of treatment. Symptoms include:  Cough.  Fever.  Coughing up thick spit.  Body aches.  Chest congestion.  Chills.  Shortness of breath.  Sore throat. GET HELP RIGHT AWAY IF:   You have an increased fever.  You have chills.  You have severe shortness of breath.  You have bloody thick spit (sputum).  You throw up (vomit) often.  You lose too much body fluid (dehydration).  You have a severe headache.  You faint. MAKE SURE YOU:   Understand these instructions.  Will watch your condition.  Will get help right away if you are not doing well or get worse. Document Released: 06/12/2007 Document Revised: 08/26/2012 Document Reviewed: 06/16/2012 [employer] Patient Information 2015 [employer]. This information is not  intended to replace advice given to you by your health care provider. Make sure you discuss any questions you have with your health care provider.  

## 2013-09-23 NOTE — Discharge Summary (Signed)
Physician Discharge Summary  [name] AST:[id number] DOB: 08-20-1925 DOA: 09/20/2013  PCP: Purvis Kilts, MD  Admit date: 09/20/2013 Discharge date: 09/23/2013  Time spent: 40 minutes  Recommendations for Outpatient Follow-up:  1. Follow up with Dr Hilma Favors 1 week for evaluation of respiratory status, resolution of UTI and monitoring of chronic vertigo and orthostatics  2. Evaluated by PT who recommend OP rehab. Arrangements made.   Discharge Diagnoses:  Principal Problem:   SIRS (systemic inflammatory response syndrome) Active Problems:   Hyperlipidemia   GERD (gastroesophageal reflux disease)   Sick sinus syndrome   CAD (coronary artery disease)   Essential hypertension   Anemia   Acute bronchitis   Chest pain   UTI (lower urinary tract infection)   Dyspnea   Hypokalemia   Discharge Condition: stable  Diet recommendation: heart healthy  Filed Weights   09/21/13 0512 09/22/13 0452 09/23/13 0518  Weight: 55.7 kg (122 lb 12.7 oz) 56.3 kg (124 lb 1.9 oz) 57.9 kg (127 lb 10.3 oz)    History of present illness:  [name] is a very pleasant 80 y.o. female with a past medical history that includes CAD with a history of CABG, hypertension, hyperlipidemia and CVA, dementia presented to the emergency department on 09/20/13 from home with the chief complaint of shortness of breath and chest pain. Initial evaluation revealed Sirs with likely source being urinary tract infection.   Information  obtained from the husband and the daughter who were at the bedside. Information from patient  somewhat unreliable due to her dementia. Husband reported that day prior patient developed gradual worsening of shortness of breath and complained of left anterior chest pain. He reported the night before she was unable to lie down as when she did her shortness of breath became worse. Associated symptoms included chest pain nonproductive cough and fever. She also complained of dizziness but  the family reported that she is on meclizine for chronic dizziness/vertigo. She denied lower extremity edema. She denied palpitations abdominal pain nausea vomiting diarrhea constipation. She denied any dysuria hematuria frequency or urgency. Husband reported that her appetite waxes and wanes but she had not lost weight unintentionally lately.   Workup in the emergency department revealed initial troponin negative, proBNP 1649. Complete blood count significant for leukocytosis of 13.7 with 89% relative neutrophils. Urinalysis with many bacteria many squama cell too numerous to count WBCs positive nitrites. Chest x-ray Underlying emphysematous change. No edema or consolidation. She was hemodynamically stable with a rectal temp of 101.6. She was not hypoxic. In the emergency department she was provided with nebulizer treatment as well as 500 mg of [place] [facility] Course:  Principal Problem:  SIRS (systemic inflammatory response syndrome): Likely related to urinary tract infection and bronchitis. Provided with IV Levaquin and will be discharged with po to complete total of 10 days. Blood culture with no growth to date, urine culture with gram neg rods and klebsiella pneumoniae. Sputum culture normal flora. Max temp 98, WBCs back to within normal limits.   UTI (lower urinary tract infection): See #1. Levaquin as above. Urine culture as above.  Urine output good.   Chest pain: Etiology uncertain. Resolved. Troponin negative x3. Patient with history of CAD status post CABG and sick sinus syndrome status post pacemaker. Chart review indicates she was last seen by cardiology in July of this year. EKG with no acute changes. No events on tele negative troponin. Continue aspirin and statin   Dyspnea: related to acute bronchitis.  ProBNP elevated on admission. Last echo in January of this year yields EF of 55-60% with grade 2 diastolic dysfunction, moderate left atrial enlargement. Chest x-ray with only  emphysematous changes. Provided with low dose Lasix, nebs, flutter valve. She improved slowly.  Volume status -2.2L. Weight 57.9kg from 54.3kg 3 days ago. Breath sounds improved at discharge. Recommend follow up with PCP 1 week for evaluation of respiratory status   Acute bronchitis: see above. Patient is a former smoker. X-ray with emphysematous changes.    CAD (coronary artery disease): Status post CABG. See #1   Sick sinus syndrome: Status post pacemaker. He is seen by Dr. Rayann Heman. Last seen 5/15. Note indicates PVC's noted and Dr Rayann Heman recommending avoidance of ablation for PVC's given age. She was started on Metoprolol which was later discontinued as she could not tolerate it due to hypertension.   Hyperlipidemia: Lipid panel within the limits of normal.   GERD (gastroesophageal reflux disease): remains stable; continue famotidine .   Essential hypertension: Controlled.    Anemia: Chronic. Stable.in and Continue iron supplementation  Dizziness/vertigo/orthostatic hypotension: chart review indicates chronic and mostly postural in nature. Has been on meclizine and neurology recommended weaning her off this medication. Will resume as worsening vertigo. PT eval reccomending home health PT.    Procedures:  none  Consultations:  none  Discharge Exam: Filed Vitals:   09/23/13 0518  BP: 145/81  Pulse: 77  Temp: 97.5 F (36.4 C)  Resp: 18    General: well nourished NAD Cardiovascular: irregularly irregular no MGR No LE edema Respiratory: normal effort BS with improved air flow. Minimal wheeze  Discharge Instructions You were cared for by a hospitalist during your hospital stay. If you have any questions about your discharge medications or the care you received while you were in the hospital after you are discharged, you can call the unit and asked to speak with the hospitalist on call if the hospitalist that took care of you is not available. Once you are discharged, your primary  care physician will handle any further medical issues. Please note that NO REFILLS for any discharge medications will be authorized once you are discharged, as it is imperative that you return to your primary care physician (or establish a relationship with a primary care physician if you do not have one) for your aftercare needs so that they can reassess your need for medications and monitor your lab values.  Discharge Instructions   Diet - low sodium heart healthy    Complete by:  As directed      Discharge instructions    Complete by:  As directed   Take medication as direceted Follow up with PCP in 1 week for evaluation of respiratory status as well as monitoring of chronic vertigo and orthostatics     Increase activity slowly    Complete by:  As directed           Current Discharge Medication List    START taking these medications   Details  fluconazole (DIFLUCAN) 150 MG tablet Take 1 tablet (150 mg total) by mouth daily. Qty: 1 tablet, Refills: 0    fluticasone (FLOVENT HFA) 44 MCG/ACT inhaler Inhale 2 puffs into the lungs 2 (two) times daily. Qty: 1 Inhaler, Refills: 12    furosemide (LASIX) 20 MG tablet Take 1 tablet (20 mg total) by mouth daily. Qty: 30 tablet, Refills: 0    levofloxacin (LEVAQUIN) 750 MG tablet Take 1 tablet (750 mg total) by mouth every other  day. Qty: 3 tablet, Refills: 0      CONTINUE these medications which have NOT CHANGED   Details  acetaminophen (TYLENOL) 325 MG tablet Take 650 mg by mouth every 6 (six) hours as needed for mild pain. For pain    alendronate (FOSAMAX) 70 MG tablet Take 70 mg by mouth once a week. Take with a full glass of water on an empty stomach.    aspirin EC 81 MG tablet Take 81 mg by mouth daily.    atorvastatin (LIPITOR) 10 MG tablet TAKE ONE (1) TABLET EACH DAY Qty: 30 tablet, Refills: 6    Calcium-Vitamin D (CALTRATE 600 PLUS-VIT D PO) Take 2 tablets by mouth daily.     docusate sodium (COLACE) 100 MG capsule Take 1  capsule (100 mg total) by mouth 2 (two) times daily. Qty: 10 capsule, Refills: 0    donepezil (ARICEPT) 10 MG tablet Take 10 mg by mouth daily.     ferrous sulfate 325 (65 FE) MG tablet Take 1 tablet (325 mg total) by mouth daily with breakfast. Refills: 3    gabapentin (NEURONTIN) 100 MG capsule Take 1 capsule (100 mg total) by mouth 3 (three) times daily. Qty: 90 capsule, Refills: 5    meclizine (ANTIVERT) 25 MG tablet Take 25 mg by mouth 3 (three) times daily as needed for dizziness.    nitroGLYCERIN (NITROSTAT) 0.4 MG SL tablet Place 1 tablet (0.4 mg total) under the tongue every 5 (five) minutes as needed for chest pain. Qty: 25 tablet, Refills: 6    Omega-3 Fatty Acids (FISH OIL) 1200 MG CAPS Take 1,200 mg by mouth 2 (two) times daily.     potassium chloride (K-DUR) 10 MEQ tablet Take 2 tablets (20 mEq total) by mouth daily. Qty: 60 tablet, Refills: 3    vitamin B-12 (CYANOCOBALAMIN) 1000 MCG tablet Take 1,000 mcg by mouth daily.        Allergies  Allergen Reactions  . Cephalexin Hives  . Contrast Media [Iodinated Diagnostic Agents]     Unknown  . Iohexol     Urticaria, emesis, numbness during IVP performed at [facility] years ago  . Penicillins Hives  . Shellfish Allergy Other (See Comments)    Unknown  . Sulfa Antibiotics Hives   Follow-up Information   Follow up with Purvis Kilts, MD. Schedule an appointment as soon as possible for a visit in 1 week. (for evaluation of respiratory status, resolution of UTI and monitoring of chronic vertigo/orthostasis)    Specialty:  Family Medicine   Contact information:   [address] [place] [address] [phone]        The results of significant diagnostics from this hospitalization (including imaging, microbiology, ancillary and laboratory) are listed below for reference.    Significant Diagnostic Studies: Dg Chest 2 View  09/20/2013   CLINICAL DATA:  Chest pain and difficulty breathing   EXAM: CHEST  2 VIEW  COMPARISON:  February 10, 2013  FINDINGS: There is underlying emphysematous change. There is stable apical pleural thickening bilaterally. There is no edema or consolidation. The heart size is normal. Pulmonary vascularity reflects underlying emphysema. Pacemaker leads are attached to the right atrium and right ventricle. Patient is status post coronary artery bypass grafting. There is no adenopathy. No bone lesions.  IMPRESSION: Underlying emphysematous change. No edema or consolidation. Stable apical pleural thickening bilaterally.   Electronically Signed   By: Lowella Grip M.D.   On: 09/20/2013 12:20   Dg Chest Port 1  View  09/22/2013   CLINICAL DATA:  Worsening cough, dyspnea, chest pain  EXAM: PORTABLE CHEST - 1 VIEW  COMPARISON:  09/20/2013  FINDINGS: Cardiomediastinal silhouette is stable. No acute infiltrate or pleural effusion. No pulmonary edema. Status post CABG. Dual lead cardiac pacemaker is unchanged in position.  IMPRESSION: No active disease.   Electronically Signed   By: Lahoma Crocker M.D.   On: 09/22/2013 15:07    Microbiology: Recent Results (from the past 240 hour(s))  URINE CULTURE     Status: None   Collection Time    09/20/13 10:30 AM      Result Value Ref Range Status   Specimen Description URINE, CLEAN CATCH   Final   Special Requests NONE   Final   Culture  Setup Time     Final   Value: 09/20/2013 22:50     Performed at [facility]     Final   Value: >=100,000 COLONIES/ML     Performed at [facility]   Culture     Final   Value: KLEBSIELLA PNEUMONIAE     Performed at [facility]   Report Status 09/23/2013 FINAL   Final   Organism ID, Bacteria KLEBSIELLA PNEUMONIAE   Final  CULTURE, BLOOD (ROUTINE X 2)     Status: None   Collection Time    09/20/13 10:50 AM      Result Value Ref Range Status   Specimen Description BLOOD LEFT ANTECUBITAL   Final   Special Requests BOTTLES DRAWN AEROBIC AND ANAEROBIC  6CC   Final   Culture NO GROWTH 3 DAYS   Final   Report Status PENDING   Incomplete  CULTURE, BLOOD (ROUTINE X 2)     Status: None   Collection Time    09/20/13 10:50 AM      Result Value Ref Range Status   Specimen Description BLOOD LEFT ARM   Final   Special Requests BOTTLES DRAWN AEROBIC ONLY 4CC   Final   Culture NO GROWTH 3 DAYS   Final   Report Status PENDING   Incomplete  URINE CULTURE     Status: None   Collection Time    09/20/13  5:25 PM      Result Value Ref Range Status   Specimen Description URINE, CLEAN CATCH   Final   Special Requests NONE   Final   Culture  Setup Time     Final   Value: 09/20/2013 22:50     Performed at [facility]     Final   Value: 5,000 COLONIES/ML     Performed at [facility]   Culture     Final   Value: INSIGNIFICANT GROWTH     Performed at [facility]   Report Status 09/21/2013 FINAL   Final  CULTURE, RESPIRATORY (NON-EXPECTORATED)     Status: None   Collection Time    09/21/13  8:15 AM      Result Value Ref Range Status   Specimen Description SPUTUM EXPECTORATED   Final   Special Requests NONE   Final   Gram Stain     Final   Value: MODERATE WBC PRESENT, PREDOMINANTLY PMN     RARE SQUAMOUS EPITHELIAL CELLS PRESENT     FEW GRAM POSITIVE RODS     Performed at [facility]   Culture     Final   Value: NORMAL OROPHARYNGEAL FLORA  Performed at [facility]   Report Status 09/23/2013 FINAL   Final     Labs: Basic Metabolic Panel:  Recent Labs Lab 09/20/13 1042 09/21/13 0551 09/22/13 0552  NA 140 142 140  K 4.1 3.5* 4.0  CL 102 105 103  CO2 26 25 24   GLUCOSE 676* 83 107*  BUN 14 17 14   CREATININE 0.75 0.77 0.75  CALCIUM 9.2 8.6 8.6   Liver Function Tests:  Recent Labs Lab 09/20/13 1042  AST 22  ALT 13  ALKPHOS 55  BILITOT 0.4  PROT 7.0  ALBUMIN 3.9   No results found for this basename: LIPASE, AMYLASE,  in the last 168 hours No results found for this  basename: AMMONIA,  in the last 168 hours CBC:  Recent Labs Lab 09/20/13 1042 09/21/13 0551  WBC 13.7* 9.3  NEUTROABS 12.2*  --   HGB 14.5 14.0  HCT 43.3 42.0  MCV 93.7 92.9  PLT 165 147*   Cardiac Enzymes:  Recent Labs Lab 09/20/13 1042 09/20/13 1603 09/20/13 2142  TROPONINI <0.30 <0.30 <0.30   BNP: BNP (last 3 results)  Recent Labs  09/20/13 1042  PROBNP 1649.0*   CBG: No results found for this basename: GLUCAP,  in the last 168 hours     Signed:  Radene Gunning  [facility] 09/23/2013, 1:47 PM

## 2013-09-23 NOTE — Progress Notes (Addendum)
ANTIBIOTIC CONSULT NOTE  Pharmacy Consult for Levaquin Indication: UTI  Allergies  Allergen Reactions  . Cephalexin Hives  . Contrast Media [Iodinated Diagnostic Agents]     Unknown  . Iohexol     Urticaria, emesis, numbness during IVP performed at [facility] years ago  . Penicillins Hives  . Shellfish Allergy Other (See Comments)    Unknown  . Sulfa Antibiotics Hives   Patient Measurements: Height: 5\' 4"  (162.6 cm) Weight: 127 lb 10.3 oz (57.9 kg) IBW/kg (Calculated) : 54.7  Vital Signs: Temp: 97.5 F (36.4 C) (09/17 0518) Temp src: Oral (09/17 0518) BP: 145/81 mmHg (09/17 0518) Pulse Rate: 77 (09/17 0518)  Labs:  Recent Labs  09/21/13 0551 09/22/13 0552  WBC 9.3  --   HGB 14.0  --   PLT 147*  --   CREATININE 0.77 0.75   Estimated Creatinine Clearance: 44.4 ml/min (by C-G formula based on Cr of 0.75). Microbiology: Recent Results (from the past 720 hour(s))  URINE CULTURE     Status: None   Collection Time    09/20/13 10:30 AM      Result Value Ref Range Status   Specimen Description URINE, CLEAN CATCH   Final   Special Requests NONE   Final   Culture  Setup Time     Final   Value: 09/20/2013 22:50     Performed at [facility]     Final   Value: >=100,000 COLONIES/ML     Performed at [facility]   Culture     Final   Value: KLEBSIELLA PNEUMONIAE     Performed at [facility]   Report Status 09/23/2013 FINAL   Final   Organism ID, Bacteria KLEBSIELLA PNEUMONIAE   Final  CULTURE, BLOOD (ROUTINE X 2)     Status: None   Collection Time    09/20/13 10:50 AM      Result Value Ref Range Status   Specimen Description BLOOD LEFT ANTECUBITAL   Final   Special Requests BOTTLES DRAWN AEROBIC AND ANAEROBIC 6CC   Final   Culture NO GROWTH 3 DAYS   Final   Report Status PENDING   Incomplete  CULTURE, BLOOD (ROUTINE X 2)     Status: None   Collection Time    09/20/13 10:50 AM      Result Value Ref Range Status    Specimen Description BLOOD LEFT ARM   Final   Special Requests BOTTLES DRAWN AEROBIC ONLY 4CC   Final   Culture NO GROWTH 3 DAYS   Final   Report Status PENDING   Incomplete  URINE CULTURE     Status: None   Collection Time    09/20/13  5:25 PM      Result Value Ref Range Status   Specimen Description URINE, CLEAN CATCH   Final   Special Requests NONE   Final   Culture  Setup Time     Final   Value: 09/20/2013 22:50     Performed at [facility]     Final   Value: 5,000 COLONIES/ML     Performed at [facility]   Culture     Final   Value: INSIGNIFICANT GROWTH     Performed at [facility]   Report Status 09/21/2013 FINAL   Final  CULTURE, RESPIRATORY (NON-EXPECTORATED)     Status: None   Collection Time    09/21/13  8:15 AM  Result Value Ref Range Status   Specimen Description SPUTUM EXPECTORATED   Final   Special Requests NONE   Final   Gram Stain     Final   Value: MODERATE WBC PRESENT, PREDOMINANTLY PMN     RARE SQUAMOUS EPITHELIAL CELLS PRESENT     FEW GRAM POSITIVE RODS     Performed at [facility]   Culture     Final   Value: NORMAL OROPHARYNGEAL FLORA     Performed at [facility]   Report Status 09/23/2013 FINAL   Final    Anti-infectives   Start     Dose/Rate Route Frequency Ordered Stop   09/21/13 1600  levofloxacin (LEVAQUIN) IVPB 750 mg     750 mg 100 mL/hr over 90 Minutes Intravenous Every 24 hours 09/20/13 1502     09/20/13 1600  levofloxacin (LEVAQUIN) IVPB 750 mg  Status:  Discontinued     750 mg 100 mL/hr over 90 Minutes Intravenous Every 24 hours 09/20/13 1500 09/20/13 1502   09/20/13 1300  levofloxacin (LEVAQUIN) IVPB 500 mg  Status:  Discontinued     500 mg 100 mL/hr over 60 Minutes Intravenous  Once 09/20/13 1247 09/20/13 1448     Assessment: 80yo female being treated for Klebsiella UTI.  Goal of Therapy:  Eradicate infection.  Plan:  Continue Levaquin 750mg  q48hrs Switch to  PO Dose stable, sign off.  Biagio Quint R 09/23/2013,11:31 AM

## 2013-09-23 NOTE — Discharge Summary (Signed)
Patient seen, independently examined and chart reviewed. I agree with exam, assessment and plan discussed with Dyanne Carrel, NP.  Subjective: Still some dysuria but overall feeling better. Breathing well. No complaints. Wants to go home.  Objective: Afebrile, vital signs stable.  Appears calm, comfortable. Speech fluent and clear.  Cardiovascular regular rate and rhythm. No murmur, rub or gallop. No lower extremity edema.  Respiratory clear to auscultation bilaterally. No wheezes, rales or rhonchi. Normal respiratory effort.  Overall she is much improved. Plan continue treatment for UTI with possible sepsis on admission, complete total 7 days of antibiotics. Culture results noted, sensitive to Levaquin. Levaquin also be used to treat her bronchitis which is clinically improving.  She is developing of discharge, plan Diflucan as outpatient.  Discharge home today. Discussed with daughter and patient's husband at bedside.  Murray Hodgkins, MD [facility] [phone]

## 2013-09-25 LAB — CULTURE, BLOOD (ROUTINE X 2)
Culture: NO GROWTH
Culture: NO GROWTH

## 2013-10-05 ENCOUNTER — Ambulatory Visit (HOSPITAL_COMMUNITY)
Admission: RE | Admit: 2013-10-05 | Discharge: 2013-10-05 | Disposition: A | Discharge status: Home / Self Care | Payer: Medicare Other | Source: Ambulatory Visit | Attending: Neurology | Admitting: Neurology

## 2013-10-05 DIAGNOSIS — R42 Dizziness and giddiness: Secondary | ICD-10-CM

## 2013-10-05 DIAGNOSIS — R262 Difficulty in walking, not elsewhere classified: Secondary | ICD-10-CM | POA: Insufficient documentation

## 2013-10-05 DIAGNOSIS — F17200 Nicotine dependence, unspecified, uncomplicated: Secondary | ICD-10-CM | POA: Insufficient documentation

## 2013-10-05 DIAGNOSIS — IMO0001 Reserved for inherently not codable concepts without codable children: Secondary | ICD-10-CM | POA: Insufficient documentation

## 2013-10-05 DIAGNOSIS — R2689 Other abnormalities of gait and mobility: Secondary | ICD-10-CM | POA: Insufficient documentation

## 2013-10-05 DIAGNOSIS — Z9181 History of falling: Secondary | ICD-10-CM | POA: Insufficient documentation

## 2013-10-05 DIAGNOSIS — I951 Orthostatic hypotension: Secondary | ICD-10-CM | POA: Diagnosis not present

## 2013-10-05 DIAGNOSIS — I10 Essential (primary) hypertension: Secondary | ICD-10-CM | POA: Insufficient documentation

## 2013-10-05 DIAGNOSIS — M6281 Muscle weakness (generalized): Secondary | ICD-10-CM | POA: Insufficient documentation

## 2013-10-05 DIAGNOSIS — R269 Unspecified abnormalities of gait and mobility: Secondary | ICD-10-CM | POA: Diagnosis not present

## 2013-10-05 NOTE — Evaluation (Addendum)
Physical Therapy Evaluation  Patient Details  Name: [name] MRN: [id number] Date of Birth: Jun 19, 1925  Today's Date: 10/05/2013 Time: 1345-1430 PT Time Calculation (min): 45 min     Charges: 1 Evaluation         Visit#: 1 of 16  Re-eval: 11/04/13 Assessment Diagnosis: gait and balance disorder Next MD Visit: Marisa Hua Prior Therapy: yes, helpful.   Authorization: [employer] Medicare    Authorization Time Period:    Authorization Visit#:   of     Past Medical History:  Past Medical History  Diagnosis Date  . CAD (coronary artery disease)     CABG surgery in 06/2005 for left main disease; normal EF; negative stress nuclear 08/2008  . Cerebrovascular disease     1993-CVA; 08/2008-mild atherosclerosis without focal stenosis  . Syncope      Negative even recorder dated 3/08  . Tobacco abuse     60 pack years; continuing  . Orthostatic hypotension   . Diverticulosis   . Osteopenia   . GERD (gastroesophageal reflux disease)   . Hypertension   . Mental disorder     mild dementia  . Anemia   . Sinus bradycardia     s/p STJ Assurity dual chamber pacemaker by Dr Rayann Heman 02/2013  . Frequent PVCs   . Family history of anesthesia complication     "makes daughter sick" (02/09/2013)  . High cholesterol   . Asthma   . Chronic bronchitis     "haven't had it in last 2-3 years; used to get it q yr" (02/09/2013)  . Pneumonia     "couple times; several years ago" (02/09/2013)  . GEXBMWUX(324.4)     "sometimes weekly" (02/09/2013)  . Arthritis     "fingers; back" (02/09/2013)  . Chronic back pain greater than 3 months duration     "nerve damage down into left leg" (02/09/2013)  . Frequent UTI   . Dementia     "more than mild; having more short term memory loss recently; on Aricept" (02/09/2013)   Past Surgical History:  Past Surgical History  Procedure Laterality Date  . Abdominal hysterectomy  1990  . Ventral hernia repair    . Appendectomy    . Tonsillectomy    . Colonoscopy  10/10/2011     Procedure: COLONOSCOPY;  Surgeon: Rogene Houston, MD;  Location: [facility];  Service: Endoscopy;  Laterality: N/A;  1230  . Esophagogastroduodenoscopy  10/10/2011    Procedure: ESOPHAGOGASTRODUODENOSCOPY (EGD);  Surgeon: Rogene Houston, MD;  Location: [facility];  Service: Endoscopy;  Laterality: N/A;  . Pacemaker insertion  02/09/2013    STJ Assurity pacemaker implanted by Dr Rayann Heman for symptomatic bradycardia  . Hernia repair    . Coronary artery bypass graft  2007    Left main disease  . Cardiac catheterization  2007  . Cataract extraction, bilateral      Subjective Symptoms/Limitations Symptoms: Primary complaint of poor balance and falls secondary to dizziness. no dizziness with turning head, primarily with change in psition.  Pertinent History: History of dizziness. Patient stays dizzy a lot, for which she is being treated pharmacuetically but notes conitnued dizziness. Patient reports > 10 falls in last 6 months, unable to recount actual number due to high number of falls. Patient was recently in hospital and has no idea why. Daughter states patient was hospitalized due to a fall, notes patient does not properly hydrate and that patient's inner ear was checked by MD who determined patient did  not have an inner ear problem rather a balance problem attributed to orthostatic hypotension and weakenss in bilateral LE. Patient with history of heardt disease, pace maker and chest pain.. Notes dizziness with any exercise and occasional chest pain. Marland Kitchen  How long can you sit comfortably?: difficulty standing after prolonged sitting.  How long can you stand comfortably?: >30 minutes How long can you walk comfortably?: sometimes no prolem, sometimes <57minutes.  Patient Stated Goals: to be able to stand up and walk wihtotu being afraid of falling.  Pain Assessment Currently in Pain?: No/denies Pain Score: 0-No pain Pain Location: Head  Precautions/Restrictions  Precautions Precautions:  Fall Precaution Comments: Dizziness (BPPV??)  Balance Screening    Prior Functioning     Cognition/Observation Observation/Other Assessments Other Assessments: 5x sit to stand 43 seconds, increased dizziness per repetition.  Sensation/Coordination/Flexibility/Functional Tests Flexibility Thomas: Positive 90/90: Positive  Assessment RLE AROM (degrees) RLE Overall AROM Comments: WNL unless otherwise noted RLE Strength Right Hip Flexion: 3+/5 (4-/5) Right Hip Extension: 2+/5 Right Hip ABduction: 2+/5 Right Hip ADduction: 3/5 Right Knee Flexion:  (4-/5) Right Knee Extension:  (4-/5) Right Ankle Dorsiflexion:  (4-/5) Right Ankle Plantar Flexion: 2+/5 LLE AROM (degrees) LLE Overall AROM Comments: WNL unless otherwise noted LLE Strength Left Hip Flexion: 3+/5 Left Hip Extension: 2+/5 Left Hip ABduction: 2+/5 Left Knee Flexion:  (4-/5) Left Knee Extension:  (4-/5) Left Ankle Dorsiflexion: 4/5 Left Ankle Plantar Flexion: 2+/5  Physical Therapy Assessment and Plan PT Assessment and Plan Clinical Impression Statement: Patient displays difficulty walking, decreased balance, and generalized LE weakness resulting in recurrent falls. Patient will benefit from skileld phsyical therapy to increase LE strength, recieve education on proper gait, changing positions, and causes of orthostatic hypotension, gait trianing to improve walking, and functional strnegthening to improve ability to ambualte safely in and outside of hiome.   Pt will benefit from skilled therapeutic intervention in order to improve on the following deficits: Abnormal gait;Decreased activity tolerance;Decreased balance;Decreased cognition;Decreased coordination;Difficulty walking;Decreased strength;Impaired flexibility;Decreased safety awareness;Improper body mechanics;Decreased range of motion;Decreased mobility;Increased fascial restricitons;Decreased knowledge of use of DME Rehab Potential: Good PT Frequency: Min  2X/week PT Duration: 8 weeks PT Treatment/Interventions: Gait training;Functional mobility training;Therapeutic exercise;DME instruction;Stair training;Therapeutic activities;Balance training;Neuromuscular re-education;Manual techniques;Patient/family education PT Plan: Focus on education of Orthostatic hypotension, Strength trainign of LE and performance of dynamic gait activities to increase functional balance. Next session patient will be educated on performance of proper hydration/nutrition, and  introduction of squatting and balance activities to be bperformed as part of HEP    Goals Home Exercise Program Pt/caregiver will Perform Home Exercise Program: For increased ROM PT Goal: Perform Home Exercise Program - Progress: Goal set today PT Short Term Goals Time to Complete Short Term Goals: 4 weeks PT Short Term Goal 1: Patient will report drinking >7 cups of water daily to stay properly hydrated to decrease risk of orthostatic hypotension PT Short Term Goal 2: Patient will be able to demosntrate glute strength >2+/5 to be able to sit to stand without use of UE and with complaint of no more than mild dzziness PT Short Term Goal 3: Patient will report falling < 1x per 2 weeks to decrease risk of injury  PT Short Term Goal 4: patient will be able to perform 5x sit to stand in <15 seconds  indicating patient not at high falls risk PT Long Term Goals Time to Complete Long Term Goals: 8 weeks PT Long Term Goal 1: Patient will display a BERG Balance score of >52 indicatign  patient not at high falls risk PT Long Term Goal 2: Patient will be able to demosntrate glut max/med strength >3+/5 so patient can ambulate up and down stairs with only 1 HHA.  Long Term Goal 3: Patient will report not falling for >2 weeks.  Long Term Goal 4: Patient will be able to perform TUG in <15 seconds indicating patient not at high falls risk   Problem List Patient Active Problem List   Diagnosis Date Noted  .  Balance disorder 10/05/2013  . Dizziness of unknown cause 10/05/2013  . Difficulty in walking(719.7) 10/05/2013  . Muscle weakness (generalized) 10/05/2013  . Hypokalemia 09/21/2013  . Acute bronchitis 09/20/2013  . SIRS (systemic inflammatory response syndrome) 09/20/2013  . Chest pain 09/20/2013  . UTI (lower urinary tract infection) 09/20/2013  . Dyspnea 09/20/2013  . Anemia 02/09/2013  . Sick sinus syndrome 01/27/2013  . Premature ventricular contraction 01/27/2013  . CAD (coronary artery disease) 01/27/2013  . Essential hypertension 01/27/2013  . Hx of falling, presenting hazards to health 12/21/2012  . Laboratory test 05/09/2011  . Arteriosclerotic cardiovascular disease (ASCVD)   . Cerebrovascular disease   . Syncope   . Hyperlipidemia 05/07/2009  . Tobacco abuse 05/07/2009  . Orthostatic hypotension 05/07/2009  . GERD (gastroesophageal reflux disease) 09/19/2008  . Osteopenia 09/19/2008    PT Plan of Care PT Home Exercise Plan: to be given next session  GP   Functional Assessment Tool Used: clinical judgement Functional Limitation: Mobility: Walking and moving around Mobility: Walking and Moving Around Current Status ([id number]): At least 20 percent but less than 40 percent impaired, limited or restricted Mobility: Walking and Moving Around Goal Status [phone]): At least 1 percent but less than 20 percent impaired, limited or restricted  Leia Alf 10/05/2013, 7:22 PM  Physician Documentation Your signature is required to indicate approval of the treatment plan as stated above.  Please sign and either send electronically or make a copy of this report for your files and return this physician signed original.   Please mark one 1.__approve of plan  2. ___approve of plan with the following conditions.   ______________________________                                                          _____________________ Physician Signature                                                                                                              Date

## 2013-10-07 ENCOUNTER — Ambulatory Visit (HOSPITAL_COMMUNITY)
Admission: RE | Admit: 2013-10-07 | Discharge: 2013-10-07 | Disposition: A | Discharge status: Home / Self Care | Payer: Medicare Other | Source: Ambulatory Visit | Attending: Family Medicine | Admitting: Family Medicine

## 2013-10-07 DIAGNOSIS — Z5189 Encounter for other specified aftercare: Secondary | ICD-10-CM | POA: Diagnosis not present

## 2013-10-07 DIAGNOSIS — I951 Orthostatic hypotension: Secondary | ICD-10-CM | POA: Insufficient documentation

## 2013-10-07 DIAGNOSIS — I10 Essential (primary) hypertension: Secondary | ICD-10-CM | POA: Insufficient documentation

## 2013-10-07 DIAGNOSIS — Z9181 History of falling: Secondary | ICD-10-CM | POA: Diagnosis not present

## 2013-10-07 DIAGNOSIS — R269 Unspecified abnormalities of gait and mobility: Secondary | ICD-10-CM | POA: Insufficient documentation

## 2013-10-07 DIAGNOSIS — M6281 Muscle weakness (generalized): Secondary | ICD-10-CM | POA: Diagnosis not present

## 2013-10-07 DIAGNOSIS — F17210 Nicotine dependence, cigarettes, uncomplicated: Secondary | ICD-10-CM | POA: Diagnosis not present

## 2013-10-07 NOTE — Progress Notes (Signed)
Physical Therapy Treatment Patient Details  Name: [name] MRN: [id number] Date of Birth: 08/11/1925  Today's Date: 10/07/2013 Time: 7106-2694 PT Time Calculation (min): 44 min Charge: TE 8546-2703, Self care 1010-1018  Visit#: 2 of 16  Re-eval: 11/04/13 Assessment Diagnosis: gait and balance disorder Next MD Visit: Marisa Hua Prior Therapy: yes, helpful.   Authorization: [employer] Medicare  Authorization Time Period:    Authorization Visit#:   of     Subjective: Symptoms/Limitations Symptoms: Pain free today, c/odizziness today, Pain Assessment Currently in Pain?: No/denies  Precautions/Restrictions  Precautions Precautions: Fall Precaution Comments: Dizziness (BPPV??)  Exercise/Treatments Standing Heel Raises: 10 reps;Limitations Heel Raises Limitations: Toe raises Functional Squat: 10 reps Seated Other Seated Knee Exercises: Visual tracking with head still eyes following finger and eyes focused on finger and cervical rotation 5x each Supine Bridges: 10 reps Straight Leg Raises: Both;10 reps Sidelying Hip ABduction: Both;10 reps   Physical Therapy Assessment and Plan PT Assessment and Plan Clinical Impression Statement: Dizziness primary limitatin this session.  Began visual tracking with noted slight nystagnus to the Left.  Began LE strentghening exercises with therpaist facilitation for proper form and techniques.  Multimodal cueing with all exercises requiring increased time to follow instructions.  Pt and daughter educated on importance of hydration and given HEP with explaination of proper technique  to increase nutritional and exercises adherence. PT Plan: Focus on education of Orthostatic hypotension, Strength trainign of LE and performance of dynamic gait activities to increase functional balance. Next session patient will be educated on performance of proper hydration/nutrition, and  introduction of squatting and balance activities to be bperformed as part  of HEP    Goals PT Short Term Goals PT Short Term Goal 1: Patient will report drinking >7 cups of water daily to stay properly hydrated to decrease risk of orthostatic hypotension PT Short Term Goal 1 - Progress: Progressing toward goal PT Short Term Goal 2: Patient will be able to demosntrate glute strength >2+/5 to be able to sit to stand without use of UE and with complaint of no more than mild dzziness PT Short Term Goal 2 - Progress: Progressing toward goal PT Short Term Goal 3: Patient will report falling < 1x per 2 weeks to decrease risk of injury  PT Short Term Goal 4: patient will be able to perform 5x sit to stand in <15 seconds  indicating patient not at high falls risk PT Long Term Goals PT Long Term Goal 1: Patient will display a BERG Balance score of >52 indicatign patient not at high falls risk PT Long Term Goal 1 - Progress: Progressing toward goal PT Long Term Goal 2: Patient will be able to demosntrate glut max/med strength >3+/5 so patient can ambulate up and down stairs with only 1 HHA.  Long Term Goal 3: Patient will report not falling for >2 weeks.  Long Term Goal 4: Patient will be able to perform TUG in <15 seconds indicating patient not at high falls risk   Problem List Patient Active Problem List   Diagnosis Date Noted  . Balance disorder 10/05/2013  . Dizziness of unknown cause 10/05/2013  . Difficulty in walking(719.7) 10/05/2013  . Muscle weakness (generalized) 10/05/2013  . Hypokalemia 09/21/2013  . Acute bronchitis 09/20/2013  . SIRS (systemic inflammatory response syndrome) 09/20/2013  . Chest pain 09/20/2013  . UTI (lower urinary tract infection) 09/20/2013  . Dyspnea 09/20/2013  . Anemia 02/09/2013  . Sick sinus syndrome 01/27/2013  . Premature ventricular contraction 01/27/2013  .  CAD (coronary artery disease) 01/27/2013  . Essential hypertension 01/27/2013  . Hx of falling, presenting hazards to health 12/21/2012  . Laboratory test 05/09/2011   . Arteriosclerotic cardiovascular disease (ASCVD)   . Cerebrovascular disease   . Syncope   . Hyperlipidemia 05/07/2009  . Tobacco abuse 05/07/2009  . Orthostatic hypotension 05/07/2009  . GERD (gastroesophageal reflux disease) 09/19/2008  . Osteopenia 09/19/2008    PT - End of Session Equipment Utilized During Treatment: Gait belt Activity Tolerance: Patient limited by fatigue;Other (comment) (Limited by c/o dizziness) General Behavior During Therapy: [facility] for tasks assessed/performed  GP    Aldona Lento 10/07/2013, 10:43 AM

## 2013-10-12 ENCOUNTER — Ambulatory Visit (HOSPITAL_COMMUNITY)
Admission: RE | Admit: 2013-10-12 | Discharge: 2013-10-12 | Disposition: A | Discharge status: Home / Self Care | Payer: Medicare Other | Source: Ambulatory Visit | Attending: Neurology | Admitting: Neurology

## 2013-10-12 DIAGNOSIS — Z5189 Encounter for other specified aftercare: Secondary | ICD-10-CM | POA: Diagnosis not present

## 2013-10-12 NOTE — Progress Notes (Signed)
Physical Therapy Treatment Patient Details  Name: [name] MRN: [id number] Date of Birth: 28-Dec-1925  Today's Date: 10/12/2013 Time: 4580-9983 PT Time Calculation (min): 45 min   Charges: TE 1430-1500, Manual 3825-0539 Visit#: 3 of 16  Re-eval: 11/04/13 Assessment Diagnosis: gait and balance disorder Next MD Visit: Marisa Hua Prior Therapy: yes, helpful.   Authorization: [employer] Medicare  Authorization Time Period:    Authorization Visit#:   of     Subjective: Symptoms/Limitations Symptoms: Pain free today, c/odizziness today, patient notes she has been feeling a little better lately.  Pain Assessment Currently in Pain?: No/denies  Precautions/Restrictions  Precautions Precautions: Fall Precaution Comments: Dizziness (BPPV??)  Exercise/Treatments Standing Heel Raises: 10 reps;Limitations Heel Raises Limitations: Toe raises Functional Squat: 10 reps Seated Other Seated Knee Exercises: Visual tracking with head still eyes following finger and eyes focused on finger and cervical rotation 5x each Other Seated Knee Exercises: 3D neck excursions 10x Supine Other Supine Knee Exercises: cervical retraction 10x  Manual therapy: soft tissue mobilization of cervical and thoracic spine, Sub-occipital release  Physical Therapy Assessment and Plan PT Assessment and Plan Clinical Impression Statement:  Dizziness primary limitation this session, therapist discussed with daughter patient's history of dizziness noting that nothing has seemed to improve dizziness and that she wants the patient to focus on improving balance and LE strengthening. Noticed increased dizziness with head movements. That did not improve following cervical stretches, thoracic spine mobility exercises or cervical retraction exercises. Continued LE strengthening exercises with therapist facilitation for proper form and techniques. Multimodal cueing with all exercises requiring increased time to follow  instructions.  PT Plan: Focus on strength training of LE and performance of dynamic gait activities to increase functional balance. Conitnue introduction of squatting and balance activities to be performed as part of HEP    Problem List Patient Active Problem List   Diagnosis Date Noted  . Balance disorder 10/05/2013  . Dizziness of unknown cause 10/05/2013  . Difficulty in walking(719.7) 10/05/2013  . Muscle weakness (generalized) 10/05/2013  . Hypokalemia 09/21/2013  . Acute bronchitis 09/20/2013  . SIRS (systemic inflammatory response syndrome) 09/20/2013  . Chest pain 09/20/2013  . UTI (lower urinary tract infection) 09/20/2013  . Dyspnea 09/20/2013  . Anemia 02/09/2013  . Sick sinus syndrome 01/27/2013  . Premature ventricular contraction 01/27/2013  . CAD (coronary artery disease) 01/27/2013  . Essential hypertension 01/27/2013  . Hx of falling, presenting hazards to health 12/21/2012  . Laboratory test 05/09/2011  . Arteriosclerotic cardiovascular disease (ASCVD)   . Cerebrovascular disease   . Syncope   . Hyperlipidemia 05/07/2009  . Tobacco abuse 05/07/2009  . Orthostatic hypotension 05/07/2009  . GERD (gastroesophageal reflux disease) 09/19/2008  . Osteopenia 09/19/2008    PT - End of Session Equipment Utilized During Treatment: Gait belt Activity Tolerance: Patient limited by fatigue;Other (comment) General Behavior During Therapy: [facility] for tasks assessed/performed  GP    Korrie Hofbauer R 10/12/2013, 4:01 PM

## 2013-10-14 ENCOUNTER — Other Ambulatory Visit: Payer: Self-pay

## 2013-10-14 ENCOUNTER — Emergency Department (HOSPITAL_COMMUNITY)
Admission: EM | Admit: 2013-10-14 | Discharge: 2013-10-14 | Disposition: A | Discharge status: Home / Self Care | Payer: Medicare Other | Attending: Emergency Medicine | Admitting: Emergency Medicine

## 2013-10-14 ENCOUNTER — Emergency Department (HOSPITAL_COMMUNITY): Payer: Medicare Other

## 2013-10-14 ENCOUNTER — Ambulatory Visit (HOSPITAL_COMMUNITY)
Admission: RE | Admit: 2013-10-14 | Discharge: 2013-10-14 | Disposition: A | Discharge status: Home / Self Care | Payer: Medicare Other | Source: Ambulatory Visit | Attending: Physical Therapy | Admitting: Physical Therapy

## 2013-10-14 ENCOUNTER — Encounter (HOSPITAL_COMMUNITY): Payer: Self-pay | Admitting: Emergency Medicine

## 2013-10-14 DIAGNOSIS — J45909 Unspecified asthma, uncomplicated: Secondary | ICD-10-CM | POA: Diagnosis not present

## 2013-10-14 DIAGNOSIS — M1990 Unspecified osteoarthritis, unspecified site: Secondary | ICD-10-CM | POA: Diagnosis not present

## 2013-10-14 DIAGNOSIS — Z5189 Encounter for other specified aftercare: Secondary | ICD-10-CM | POA: Diagnosis not present

## 2013-10-14 DIAGNOSIS — Z951 Presence of aortocoronary bypass graft: Secondary | ICD-10-CM | POA: Insufficient documentation

## 2013-10-14 DIAGNOSIS — K219 Gastro-esophageal reflux disease without esophagitis: Secondary | ICD-10-CM | POA: Insufficient documentation

## 2013-10-14 DIAGNOSIS — Z9889 Other specified postprocedural states: Secondary | ICD-10-CM | POA: Insufficient documentation

## 2013-10-14 DIAGNOSIS — F0390 Unspecified dementia without behavioral disturbance: Secondary | ICD-10-CM | POA: Insufficient documentation

## 2013-10-14 DIAGNOSIS — G8929 Other chronic pain: Secondary | ICD-10-CM | POA: Diagnosis not present

## 2013-10-14 DIAGNOSIS — Z87440 Personal history of urinary (tract) infections: Secondary | ICD-10-CM | POA: Insufficient documentation

## 2013-10-14 DIAGNOSIS — I2510 Atherosclerotic heart disease of native coronary artery without angina pectoris: Secondary | ICD-10-CM | POA: Insufficient documentation

## 2013-10-14 DIAGNOSIS — E782 Mixed hyperlipidemia: Secondary | ICD-10-CM | POA: Insufficient documentation

## 2013-10-14 DIAGNOSIS — I10 Essential (primary) hypertension: Secondary | ICD-10-CM | POA: Insufficient documentation

## 2013-10-14 DIAGNOSIS — Z8701 Personal history of pneumonia (recurrent): Secondary | ICD-10-CM | POA: Diagnosis not present

## 2013-10-14 DIAGNOSIS — R079 Chest pain, unspecified: Secondary | ICD-10-CM | POA: Diagnosis not present

## 2013-10-14 LAB — BASIC METABOLIC PANEL
Anion gap: 10 (ref 5–15)
BUN: 11 mg/dL (ref 6–23)
CO2: 31 mEq/L (ref 19–32)
CREATININE: 0.76 mg/dL (ref 0.50–1.10)
Calcium: 9.2 mg/dL (ref 8.4–10.5)
Chloride: 103 mEq/L (ref 96–112)
GFR, EST AFRICAN AMERICAN: 87 mL/min — AB (ref >90)
GFR, EST NON AFRICAN AMERICAN: 75 mL/min — AB (ref >90)
GLUCOSE: 91 mg/dL (ref 70–99)
Potassium: 3.7 mEq/L (ref 3.7–5.3)
Sodium: 144 mEq/L (ref 137–147)

## 2013-10-14 LAB — I-STAT TROPONIN, ED
TROPONIN I, POC: 0 ng/mL (ref 0.00–0.08)
Troponin i, poc: 0 ng/mL (ref 0.00–0.08)

## 2013-10-14 LAB — CBC WITH DIFFERENTIAL/PLATELET
Basophils Absolute: 0.1 10*3/uL (ref 0.0–0.1)
Basophils Relative: 1 % (ref 0–1)
EOS PCT: 5 % (ref 0–5)
Eosinophils Absolute: 0.3 10*3/uL (ref 0.0–0.7)
HCT: 43.5 % (ref 36.0–46.0)
HEMOGLOBIN: 14.3 g/dL (ref 12.0–15.0)
LYMPHS ABS: 1.1 10*3/uL (ref 0.7–4.0)
Lymphocytes Relative: 20 % (ref 12–46)
MCH: 30.8 pg (ref 26.0–34.0)
MCHC: 32.9 g/dL (ref 30.0–36.0)
MCV: 93.8 fL (ref 78.0–100.0)
MONOS PCT: 11 % (ref 3–12)
Monocytes Absolute: 0.7 10*3/uL (ref 0.1–1.0)
Neutro Abs: 3.7 10*3/uL (ref 1.7–7.7)
Neutrophils Relative %: 63 % (ref 43–77)
Platelets: 232 10*3/uL (ref 150–400)
RBC: 4.64 MIL/uL (ref 3.87–5.11)
RDW: 13 % (ref 11.5–15.5)
WBC: 5.8 10*3/uL (ref 4.0–10.5)

## 2013-10-14 MED ORDER — NITROGLYCERIN 0.4 MG SL SUBL
0.4000 mg | SUBLINGUAL_TABLET | Freq: Once | SUBLINGUAL | Status: AC
  Administered 2013-10-14: 0.4 mg via SUBLINGUAL
  Filled 2013-10-14: qty 1

## 2013-10-14 MED ORDER — ISOSORBIDE MONONITRATE ER 30 MG PO TB24: 30.0000 mg | ORAL_TABLET | Freq: Every day | ORAL | Status: DC

## 2013-10-14 MED ORDER — ASPIRIN 325 MG PO TABS
325.0000 mg | ORAL_TABLET | Freq: Once | ORAL | Status: AC
  Administered 2013-10-14: 325 mg via ORAL
  Filled 2013-10-14: qty 1

## 2013-10-14 MED ORDER — NITROGLYCERIN 2 % TD OINT
1.0000 [in_us] | TOPICAL_OINTMENT | Freq: Four times a day (QID) | TRANSDERMAL | Status: DC
  Administered 2013-10-14: 1 [in_us] via TOPICAL
  Filled 2013-10-14: qty 1

## 2013-10-14 NOTE — ED Notes (Signed)
Attempted to interrogate pacemaker per MD orders. Unable to do so even after speaking with device representative multiple times. Stated that they would send a representative to ED to investigate it.

## 2013-10-14 NOTE — Progress Notes (Signed)
Physical Therapy Treatment Patient Details  Name: [name] MRN: [id number] Date of Birth: 09-05-1925  Today's Date: 10/14/2013 Time: [phone] PT Time Calculation (min): 20 min   Charges: Neuromuscular Re-Ed [phone] Visit#: 4 of 16   Authorization: [employer] Medicare   Subjective: Symptoms/Limitations Symptoms: Patient arrives late to therapy today. Pain free at beginning of session today, conintued c/o dizziness.   Physical Therapy Assessment and Plan PT Assessment and Plan Clinical Impression Statement: Patient experienced chest pauin during therapy following 3 minutes of rest from exercise patient's BP 152/86, HR 91, O2 97, following 5 minutes of rest BP 125/86 HR 76, 23minutes BP 130/87, HR 74 with continued chest pain throughout following walking <57minutes. Patient typically has no chest or shoulder pain. Patient blood pressure has been previously assessed and is typically <120/80. Patient and husband (driver) instructed to drive to [facility] ER to assess for possible heart attack.  Exercises performed during therapy: 4 minutes on Rockerboard,  30seconds celf stretch and 2 minutes of walking PT Plan: await clearance from ER as patient displayed symptoms this session consistent with possible heart attack. Upon return to therapy progress balance exercises    Problem List Patient Active Problem List   Diagnosis Date Noted  . Balance disorder 10/05/2013  . Dizziness of unknown cause 10/05/2013  . Difficulty in walking(719.7) 10/05/2013  . Muscle weakness (generalized) 10/05/2013  . Hypokalemia 09/21/2013  . Acute bronchitis 09/20/2013  . SIRS (systemic inflammatory response syndrome) 09/20/2013  . Chest pain 09/20/2013  . UTI (lower urinary tract infection) 09/20/2013  . Dyspnea 09/20/2013  . Anemia 02/09/2013  . Sick sinus syndrome 01/27/2013  . Premature ventricular contraction 01/27/2013  . CAD (coronary artery disease) 01/27/2013  . Essential hypertension 01/27/2013  . Hx  of falling, presenting hazards to health 12/21/2012  . Laboratory test 05/09/2011  . Arteriosclerotic cardiovascular disease (ASCVD)   . Cerebrovascular disease   . Syncope   . Hyperlipidemia 05/07/2009  . Tobacco abuse 05/07/2009  . Orthostatic hypotension 05/07/2009  . GERD (gastroesophageal reflux disease) 09/19/2008  . Osteopenia 09/19/2008    PT - End of Session Equipment Utilized During Treatment: Gait belt Activity Tolerance: Patient limited by fatigue;Other (comment) General Behavior During Therapy: [facility] for tasks assessed/performed  GP    Dawud Mays R 10/14/2013, 10:15 AM

## 2013-10-14 NOTE — ED Provider Notes (Addendum)
CSN: [id number]     Arrival date & time 10/14/13  1016 History  This chart was scribed for Merryl Hacker, MD by Zola Button, ED Scribe. This patient was seen in room APA09/APA09 and the patient's care was started at 10:57 AM.     Chief Complaint  Patient presents with  . Chest Pain      HPI HPI Comments: [name] is a 80 y.o. female with a hx of CAD and heart surgeries who presents to the Emergency Department complaining of gradual onset, constant, non-radiating, left-sided chest pain that began this morning at PT at 9:30am. The pain is described as a 4-5/10 in severity and similar to prior chest pain due to cardiac problems. Per relative, the patient took baby aspirin earlier today, but has not taken any NTG yet. She has associated SOB. Patient denies diaphoresis, fever, and cough.  Cardiologist: Dr. Bronson Ing   Past Medical History  Diagnosis Date  . CAD (coronary artery disease)     CABG surgery in 06/2005 for left main disease; normal EF; negative stress nuclear 08/2008  . Cerebrovascular disease     1993-CVA; 08/2008-mild atherosclerosis without focal stenosis  . Syncope      Negative even recorder dated 3/08  . Tobacco abuse     60 pack years; continuing  . Orthostatic hypotension   . Diverticulosis   . Osteopenia   . GERD (gastroesophageal reflux disease)   . Hypertension   . Mental disorder     mild dementia  . Anemia   . Sinus bradycardia     s/p STJ Assurity dual chamber pacemaker by Dr Rayann Heman 02/2013  . Frequent PVCs   . Family history of anesthesia complication     "makes daughter sick" (02/09/2013)  . High cholesterol   . Asthma   . Chronic bronchitis     "haven't had it in last 2-3 years; used to get it q yr" (02/09/2013)  . Pneumonia     "couple times; several years ago" (02/09/2013)  . FUXNATFT(732.2)     "sometimes weekly" (02/09/2013)  . Arthritis     "fingers; back" (02/09/2013)  . Chronic back pain greater than 3 months duration     "nerve damage  down into left leg" (02/09/2013)  . Frequent UTI   . Dementia     "more than mild; having more short term memory loss recently; on Aricept" (02/09/2013)   Past Surgical History  Procedure Laterality Date  . Abdominal hysterectomy  1990  . Ventral hernia repair    . Appendectomy    . Tonsillectomy    . Colonoscopy  10/10/2011    Procedure: COLONOSCOPY;  Surgeon: Rogene Houston, MD;  [facility];  Service: Endoscopy;  Laterality: N/A;  1230  . Esophagogastroduodenoscopy  10/10/2011    Procedure: ESOPHAGOGASTRODUODENOSCOPY (EGD);  Surgeon: Rogene Houston, MD;  Location: [facility];  Service: Endoscopy;  Laterality: N/A;  . Pacemaker insertion  02/09/2013    STJ Assurity pacemaker implanted by Dr Rayann Heman for symptomatic bradycardia  . Hernia repair    . Coronary artery bypass graft  2007    Left main disease  . Cardiac catheterization  2007  . Cataract extraction, bilateral     Family History  Problem Relation Age of Onset  . Heart disease    . Arthritis    . Lung disease    . Asthma     History  Substance Use Topics  . Smoking status: Former Smoker --  0.50 packs/day for 60 years    Types: Cigarettes  . Smokeless tobacco: Never Used     Comment: 02/09/2013 "quit smoking in ~ 2009"  . Alcohol Use: No   OB History   Grav Para Term Preterm Abortions TAB SAB Ect Mult Living                 Review of Systems  Constitutional: Negative for fever.  Respiratory: Positive for chest tightness and shortness of breath. Negative for cough.   Cardiovascular: Positive for chest pain. Negative for leg swelling.  Gastrointestinal: Negative for nausea, vomiting and abdominal pain.  Genitourinary: Negative for dysuria.  Musculoskeletal: Negative for back pain.  Neurological: Negative for headaches.  Psychiatric/Behavioral: Negative for confusion.  All other systems reviewed and are negative.     Allergies  Cephalexin; Contrast media; Iohexol; Penicillins; Shellfish allergy;  and Sulfa antibiotics  Home Medications   Prior to Admission medications   Medication Sig Start Date End Date Taking? Authorizing Provider  aspirin EC 81 MG tablet Take 81 mg by mouth daily.   Yes Historical Provider, MD  atorvastatin (LIPITOR) 10 MG tablet Take 10 mg by mouth at bedtime.   Yes Historical Provider, MD  Calcium-Vitamin D (CALTRATE 600 PLUS-VIT D PO) Take 1 tablet by mouth 2 (two) times daily.    Yes Historical Provider, MD  donepezil (ARICEPT) 10 MG tablet Take 10 mg by mouth daily.    Yes Historical Provider, MD  furosemide (LASIX) 20 MG tablet Take 1 tablet (20 mg total) by mouth daily. 09/23/13  Yes Radene Gunning, NP  Omega-3 Fatty Acids (FISH OIL) 1200 MG CAPS Take 1,200 mg by mouth 2 (two) times daily.    Yes Historical Provider, MD  potassium chloride (K-DUR) 10 MEQ tablet Take 2 tablets (20 mEq total) by mouth daily. 02/10/13  Yes Brooke O Edmisten, PA-C  vitamin B-12 (CYANOCOBALAMIN) 1000 MCG tablet Take 1,000 mcg by mouth daily.    Yes Historical Provider, MD  acetaminophen (TYLENOL) 325 MG tablet Take 650 mg by mouth every 6 (six) hours as needed for mild pain. For pain    Historical Provider, MD  alendronate (FOSAMAX) 70 MG tablet Take 70 mg by mouth once a week. Take with a full glass of water on an empty stomach.    Historical Provider, MD  isosorbide mononitrate (IMDUR) 30 MG 24 hr tablet Take 1 tablet (30 mg total) by mouth daily. 10/14/13   Merryl Hacker, MD  meclizine (ANTIVERT) 25 MG tablet Take 25 mg by mouth 3 (three) times daily as needed for dizziness.    Historical Provider, MD  nitroGLYCERIN (NITROSTAT) 0.4 MG SL tablet Place 1 tablet (0.4 mg total) under the tongue every 5 (five) minutes as needed for chest pain. 08/14/12   Lendon Colonel, NP   BP 150/79  Pulse 54  Temp(Src) 97.9 F (36.6 C) (Oral)  Resp 18  SpO2 100% Physical Exam  Nursing note and vitals reviewed. Constitutional: She is oriented to person, place, and time. No distress.   elderly  HENT:  Head: Normocephalic and atraumatic.  Eyes: Pupils are equal, round, and reactive to light.  Cardiovascular: Normal rate, regular rhythm and normal heart sounds.   No murmur heard. Pulmonary/Chest: Breath sounds normal. No respiratory distress. She has no wheezes.  Pacemaker in L upper chest  Abdominal: Soft. Bowel sounds are normal. There is no tenderness.  Musculoskeletal: She exhibits no edema.  Neurological: She is alert and oriented to person, place,  and time.  Skin: Skin is warm and dry.  Psychiatric: She has a normal mood and affect.    ED Course  Procedures  DIAGNOSTIC STUDIES: Oxygen Saturation is 99% on RA, nml by my interpretation.    COORDINATION OF CARE: 11:00 AM-Discussed treatment plan which includes ASA, NTG, CXR and labs with pt at bedside and pt agreed to plan.   Labs Review Labs Reviewed  BASIC METABOLIC PANEL - Abnormal; Notable for the following:    GFR calc non Af Amer 75 (*)    GFR calc Af Amer 87 (*)    All other components within normal limits  CBC WITH DIFFERENTIAL  I-STAT TROPOININ, ED  I-STAT TROPOININ, ED    Imaging Review Dg Chest 2 View  10/14/2013   CLINICAL DATA:  Initial evaluation for left-sided chest pain radiating down left arm this morning, cough on off for 2 weeks with mild shortness of breath, personal history of COPD  EXAM: CHEST  2 VIEW  COMPARISON:  09/22/2013  FINDINGS: Hyperinflation consistent with COPD. Status post CABG. Cardiac pacer unchanged with generator over left thorax.  Vascular pattern is normal.  Lungs are clear.  No effusion.  IMPRESSION: No active cardiopulmonary disease.   Electronically Signed   By: Skipper Cliche M.D.   On: 10/14/2013 12:48     EKG Interpretation Normal sinus rhythm with rate 75, frequent PVCs, no significant change from prior      MDM   Final diagnoses:  Chest pain, unspecified chest pain type    Patient presents with chest pain. Started with exercise during physical  therapy. History of coronary artery disease. Currently is having pain. Patient was given full dose aspirin, nitroglycerin. Basic labwork obtained including troponin. Initial troponin negative. EKG is nonischemic. Chest x-ray is clear. Patient reports improvement of symptoms following administration of nitroglycerin. Discussed patient with the primary cardiologist, Dr. Bronson Ing with concern for angina or ACS equivalent.  He has reviewed the patient's chart. He recommends placing the patient on a long-acting nitrate and close followup in the office. Repeat troponin at 3:30 PM approximately 6 hours after onset of symptoms is negative as well. Discussed plan with patient and her family. Pacemaker was also interrogated and did not show any events. Patient and family are comfortable plan. She continues to be chest pain-free.  I personally performed the services described in this documentation, which was scribed in my presence. The recorded information has been reviewed and is accurate.     Merryl Hacker, MD 10/14/13 1621  Merryl Hacker, MD 10/14/13 [phone]

## 2013-10-14 NOTE — ED Notes (Signed)
i-stat troponin obtained and resulted with 0.00.

## 2013-10-14 NOTE — ED Notes (Signed)
C/o left sided cp that began while in P.T. Today. Describes pain as an ache with sob.

## 2013-10-14 NOTE — Discharge Instructions (Signed)
You were seen today for chest pain.  He has a history of coronary artery disease and some features of her pain are suggestive of angina. Your initial evaluation in the emergency room is reassuring including 2 sets of cardiac enzymes, EKG, and chest x-ray. Her pacemaker was interrogated and did not show any events. I discussed with your care with your primary cardiologist who recommends placing you on a long acting nitrate for angina symptoms and a very close followup in the office. He should call his office tomorrow morning. If you have any recurrence of chest pain, shortness breath, or any new or worsening symptoms, you should be seen immediately.  Chest Pain (Nonspecific) It is often hard to give a specific diagnosis for the cause of chest pain. There is always a chance that your pain could be related to something serious, such as a heart attack or a blood clot in the lungs. You need to follow up with your health care provider for further evaluation. CAUSES   Heartburn.  Pneumonia or bronchitis.  Anxiety or stress.  Inflammation around your heart (pericarditis) or lung (pleuritis or pleurisy).  A blood clot in the lung.  A collapsed lung (pneumothorax). It can develop suddenly on its own (spontaneous pneumothorax) or from trauma to the chest.  Shingles infection (herpes zoster virus). The chest wall is composed of bones, muscles, and cartilage. Any of these can be the source of the pain.  The bones can be bruised by injury.  The muscles or cartilage can be strained by coughing or overwork.  The cartilage can be affected by inflammation and become sore (costochondritis). DIAGNOSIS  Lab tests or other studies may be needed to find the cause of your pain. Your health care provider may have you take a test called an ambulatory electrocardiogram (ECG). An ECG records your heartbeat patterns over a 24-hour period. You may also have other tests, such as:  Transthoracic echocardiogram (TTE).  During echocardiography, sound waves are used to evaluate how blood flows through your heart.  Transesophageal echocardiogram (TEE).  Cardiac monitoring. This allows your health care provider to monitor your heart rate and rhythm in real time.  Holter monitor. This is a portable device that records your heartbeat and can help diagnose heart arrhythmias. It allows your health care provider to track your heart activity for several days, if needed.  Stress tests by exercise or by giving medicine that makes the heart beat faster. TREATMENT   Treatment depends on what may be causing your chest pain. Treatment may include:  Acid blockers for heartburn.  Anti-inflammatory medicine.  Pain medicine for inflammatory conditions.  Antibiotics if an infection is present.  You may be advised to change lifestyle habits. This includes stopping smoking and avoiding alcohol, caffeine, and chocolate.  You may be advised to keep your head raised (elevated) when sleeping. This reduces the chance of acid going backward from your stomach into your esophagus. Most of the time, nonspecific chest pain will improve within 2-3 days with rest and mild pain medicine.  HOME CARE INSTRUCTIONS   If antibiotics were prescribed, take them as directed. Finish them even if you start to feel better.  For the next few days, avoid physical activities that bring on chest pain. Continue physical activities as directed.  Do not use any tobacco products, including cigarettes, chewing tobacco, or electronic cigarettes.  Avoid drinking alcohol.  Only take medicine as directed by your health care provider.  Follow your health care provider's suggestions for further  testing if your chest pain does not go away.  Keep any follow-up appointments you made. If you do not go to an appointment, you could develop lasting (chronic) problems with pain. If there is any problem keeping an appointment, call to reschedule. SEEK MEDICAL  CARE IF:   Your chest pain does not go away, even after treatment.  You have a rash with blisters on your chest.  You have a fever. SEEK IMMEDIATE MEDICAL CARE IF:   You have increased chest pain or pain that spreads to your arm, neck, jaw, back, or abdomen.  You have shortness of breath.  You have an increasing cough, or you cough up blood.  You have severe back or abdominal pain.  You feel nauseous or vomit.  You have severe weakness.  You faint.  You have chills. This is an emergency. Do not wait to see if the pain will go away. Get medical help at once. Call your local emergency services (911 in [place] Do not drive yourself to the hospital. MAKE SURE YOU:   Understand these instructions.  Will watch your condition.  Will get help right away if you are not doing well or get worse. Document Released: 10/03/2004 Document Revised: 12/29/2012 Document Reviewed: 07/30/2007 [facility] Patient Information 2015 [place], [place]. This information is not intended to replace advice given to you by your health care provider. Make sure you discuss any questions you have with your health care provider.

## 2013-10-14 NOTE — ED Notes (Signed)
Consulted [employer] Rep. Transmitter is not working properly. Rep reported would send someone down to evaluate transmitter and pt pacemaker. Family aware of delay.

## 2013-10-19 ENCOUNTER — Ambulatory Visit (INDEPENDENT_AMBULATORY_CARE_PROVIDER_SITE_OTHER): Payer: Medicare Other | Admitting: Cardiovascular Disease

## 2013-10-19 ENCOUNTER — Encounter: Payer: Self-pay | Admitting: Cardiovascular Disease

## 2013-10-19 ENCOUNTER — Ambulatory Visit (HOSPITAL_COMMUNITY): Payer: Medicare Other | Admitting: Physical Therapy

## 2013-10-19 VITALS — BP 120/68 | HR 56 | Ht 64.0 in | Wt 121.0 lb

## 2013-10-19 DIAGNOSIS — E785 Hyperlipidemia, unspecified: Secondary | ICD-10-CM

## 2013-10-19 DIAGNOSIS — Z950 Presence of cardiac pacemaker: Secondary | ICD-10-CM

## 2013-10-19 DIAGNOSIS — I25810 Atherosclerosis of coronary artery bypass graft(s) without angina pectoris: Secondary | ICD-10-CM

## 2013-10-19 DIAGNOSIS — I495 Sick sinus syndrome: Secondary | ICD-10-CM

## 2013-10-19 DIAGNOSIS — R42 Dizziness and giddiness: Secondary | ICD-10-CM

## 2013-10-19 DIAGNOSIS — I10 Essential (primary) hypertension: Secondary | ICD-10-CM

## 2013-10-19 DIAGNOSIS — I493 Ventricular premature depolarization: Secondary | ICD-10-CM

## 2013-10-19 NOTE — Patient Instructions (Signed)
Your physician wants you to follow-up in: 3 months You will receive a reminder letter in the mail two months in advance. If you don't receive a letter, please call our office to schedule the follow-up appointment.     Your physician recommends that you continue on your current medications as directed. Please refer to the Current Medication list given to you today.       Thank you for choosing [facility] !

## 2013-10-19 NOTE — Progress Notes (Signed)
Patient ID: CHISOM MUNTEAN, female   DOB: May 01, 1925, 80 y.o.   MRN: [id number]      SUBJECTIVE: The patient is an 80 yr old woman who has a history of CAD and CABG. She was recently evaluated in the ED for chest pain. I spoke with the ED physician at the time and recommended she be started on Imdur 30 mg daily. However, she did not get it filled. I reviewed all data and CBC, BMET, and troponin were unremarkable. Chest xray did not demonstrate any acute cardiopulmonary process. ECG demonstrated normal sinus rhythm with frequent PVC's.  She is here with her daughter today. She has not had any further episodes of chest pain. Her daughter believes her mother runs into problems whenever she goes for vestibular therapy. She normally has a cup of coffee and a pecan roll for breakfast and had been on a diuretic, and her daughter feels her mother is chronically dehydrated because of this. She is no longer taking the diuretic. She has chronic dizziness. She has problems with short term memory and does not recall all the details of a summer trip to [place].  Her most recent nuclear stress test was in November 2013 which showed no evidence of ischemia.  She had a pacemaker placed for tachycardia-bradycardia syndrome by Dr. Rayann Heman. She also has outflow tract PVCs for which she was started on metoprolol, but this was discontinued by her PCP as it didn't make her feel well.   Echo in 01/2013 demonstrated the following:  Mild LVH with LVEF 44-96%, grade 2 diastolic dysfunction. Moderate left atrial enlargement. MIldly thickened mtiral leaflets with mild, posteriorly directed mtiral regurgitation. Mildly sclerotic aortic valve with trivial aortic regurgitation. Mild RV dilatation. Mild to moderate tricuspid regurgitation with moderate pulmonary hypertension, PASP 52 mmHg.    Review of Systems: As per "subjective", otherwise negative.  Allergies  Allergen Reactions  . Cephalexin Hives  . Contrast  Media [Iodinated Diagnostic Agents]     Unknown  . Iohexol     Urticaria, emesis, numbness during IVP performed at [facility] years ago  . Penicillins Hives  . Shellfish Allergy Other (See Comments)    Unknown  . Sulfa Antibiotics Hives    Current Outpatient Prescriptions  Medication Sig Dispense Refill  . acetaminophen (TYLENOL) 325 MG tablet Take 650 mg by mouth every 6 (six) hours as needed for mild pain. For pain      . alendronate (FOSAMAX) 70 MG tablet Take 70 mg by mouth once a week. Take with a full glass of water on an empty stomach.      Marland Kitchen aspirin EC 81 MG tablet Take 81 mg by mouth daily.      Marland Kitchen atorvastatin (LIPITOR) 10 MG tablet Take 10 mg by mouth at bedtime.      . Calcium-Vitamin D (CALTRATE 600 PLUS-VIT D PO) Take 1 tablet by mouth 2 (two) times daily.       Marland Kitchen donepezil (ARICEPT) 10 MG tablet Take 10 mg by mouth daily.       . meclizine (ANTIVERT) 25 MG tablet Take 25 mg by mouth 3 (three) times daily as needed for dizziness.      . nitroGLYCERIN (NITROSTAT) 0.4 MG SL tablet Place 1 tablet (0.4 mg total) under the tongue every 5 (five) minutes as needed for chest pain.  25 tablet  6  . Omega-3 Fatty Acids (FISH OIL) 1200 MG CAPS Take 1,200 mg by mouth 2 (two) times daily.       Marland Kitchen  potassium chloride (K-DUR) 10 MEQ tablet Take 2 tablets (20 mEq total) by mouth daily.  60 tablet  3  . vitamin B-12 (CYANOCOBALAMIN) 1000 MCG tablet Take 1,000 mcg by mouth daily.        No current facility-administered medications for this visit.    Past Medical History  Diagnosis Date  . CAD (coronary artery disease)     CABG surgery in 06/2005 for left main disease; normal EF; negative stress nuclear 08/2008  . Cerebrovascular disease     1993-CVA; 08/2008-mild atherosclerosis without focal stenosis  . Syncope      Negative even recorder dated 3/08  . Tobacco abuse     60 pack years; continuing  . Orthostatic hypotension   . Diverticulosis   . Osteopenia   . GERD  (gastroesophageal reflux disease)   . Hypertension   . Mental disorder     mild dementia  . Anemia   . Sinus bradycardia     s/p STJ Assurity dual chamber pacemaker by Dr Rayann Heman 02/2013  . Frequent PVCs   . Family history of anesthesia complication     "makes daughter sick" (02/09/2013)  . High cholesterol   . Asthma   . Chronic bronchitis     "haven't had it in last 2-3 years; used to get it q yr" (02/09/2013)  . Pneumonia     "couple times; several years ago" (02/09/2013)  . TJQZESPQ(330.0)     "sometimes weekly" (02/09/2013)  . Arthritis     "fingers; back" (02/09/2013)  . Chronic back pain greater than 3 months duration     "nerve damage down into left leg" (02/09/2013)  . Frequent UTI   . Dementia     "more than mild; having more short term memory loss recently; on Aricept" (02/09/2013)    Past Surgical History  Procedure Laterality Date  . Abdominal hysterectomy  1990  . Ventral hernia repair    . Appendectomy    . Tonsillectomy    . Colonoscopy  10/10/2011    Procedure: COLONOSCOPY;  Surgeon: Rogene Houston, MD;  [facility];  Service: Endoscopy;  Laterality: N/A;  1230  . Esophagogastroduodenoscopy  10/10/2011    Procedure: ESOPHAGOGASTRODUODENOSCOPY (EGD);  Surgeon: Rogene Houston, MD;  [facility];  Service: Endoscopy;  Laterality: N/A;  . Pacemaker insertion  02/09/2013    STJ Assurity pacemaker implanted by Dr Rayann Heman for symptomatic bradycardia  . Hernia repair    . Coronary artery bypass graft  2007    Left main disease  . Cardiac catheterization  2007  . Cataract extraction, bilateral      History   Social History  . Marital Status: Married    Spouse Name: Percell Miller     Number of Children: N/A  . Years of Education: 12   Occupational History  . retired    Social History Main Topics  . Smoking status: Former Smoker -- 0.50 packs/day for 60 years    Types: Cigarettes    Quit date: 10/20/2006  . Smokeless tobacco: Never Used     Comment:  02/09/2013 "quit smoking in ~ 2009"  . Alcohol Use: No  . Drug Use: No  . Sexual Activity: No   Other Topics Concern  . Not on file   Social History Narrative   3 children    Right handed   HS    Drinks decaff     Filed Vitals:   10/19/13 1439  BP: 120/68  Pulse: 56  Height: 5\' 4"  (1.626 m)  Weight: 121 lb (54.885 kg)    PHYSICAL EXAM General: NAD HEENT: Normal. Neck: No JVD, no thyromegaly. Lungs: Clear to auscultation bilaterally with normal respiratory effort. CV: Nondisplaced PMI.  Regular rate and rhythm with occasional premature contractions, normal S1/S2, no S3/S4, no murmur. No pretibial or periankle edema.  Abdomen: Soft, nontender, no distention.  Neurologic: Alert and oriented.  Psych: Normal affect. Skin: Normal. Musculoskeletal: Normal range of motion, no gross deformities. Extremities: No clubbing or cyanosis.   ECG: Most recent ECG reviewed.      ASSESSMENT AND PLAN: 1. Sick sinus syndrome s/p pacemaker: Stable and follows with Dr. Rayann Heman.  2. Chest pain in context of CAD s/p CABG: Symptomatically stable with no further episodes since ED evaluation. No changes in medications required today. Continue ASA and Lipitor. I have instructed her not to have her Imdur filled. Should she experience recurrent episodes of chest pain, I will then initiate Imdur 30 mg daily. 3. Essential HTN: Controlled without antihypertensive therapy.  4. Hyperlipidemia: On Lipitor and fish oil.  5. PVC's: Unable to tolerate metoprolol.  6. Dizziness: Adequate fluid and protein intake emphasized. Goes for vestibular therapy. No longer on a diuretic.  Dispo: f/u 3 months.  Kate Sable, M.D., F.A.C.C.

## 2013-10-20 ENCOUNTER — Telehealth (HOSPITAL_COMMUNITY): Payer: Self-pay | Admitting: Neurology

## 2013-10-20 NOTE — Telephone Encounter (Signed)
Patient went to see MD. Jacinta Shoe and he advised her to stop PT. I spoke to [employer]. Patient is requesting to be discharged.

## 2013-10-21 ENCOUNTER — Ambulatory Visit (HOSPITAL_COMMUNITY): Payer: Medicare Other | Admitting: Physical Therapy

## 2013-10-26 ENCOUNTER — Ambulatory Visit (HOSPITAL_COMMUNITY): Payer: Medicare Other | Admitting: Physical Therapy

## 2013-10-28 ENCOUNTER — Ambulatory Visit (HOSPITAL_COMMUNITY): Payer: Medicare Other | Admitting: Physical Therapy

## 2013-11-02 ENCOUNTER — Ambulatory Visit (HOSPITAL_COMMUNITY): Payer: Medicare Other | Admitting: Physical Therapy

## 2013-11-04 ENCOUNTER — Ambulatory Visit (HOSPITAL_COMMUNITY): Payer: Medicare Other | Admitting: Physical Therapy

## 2013-11-05 DIAGNOSIS — Z5189 Encounter for other specified aftercare: Secondary | ICD-10-CM | POA: Diagnosis not present

## 2013-11-05 NOTE — Addendum Note (Signed)
Encounter addended by: Leia Alf, PT on: 11/05/2013  8:11 AM<BR>     Documentation filed: Clinical Notes, Flowsheet VN

## 2013-11-06 ENCOUNTER — Other Ambulatory Visit: Payer: Self-pay | Admitting: Cardiovascular Disease

## 2013-12-09 ENCOUNTER — Encounter: Payer: Self-pay | Admitting: Internal Medicine

## 2013-12-09 ENCOUNTER — Ambulatory Visit (INDEPENDENT_AMBULATORY_CARE_PROVIDER_SITE_OTHER): Payer: Medicare Other | Admitting: *Deleted

## 2013-12-09 DIAGNOSIS — I495 Sick sinus syndrome: Secondary | ICD-10-CM

## 2013-12-10 NOTE — Progress Notes (Signed)
Remote pacemaker transmission.   

## 2013-12-16 ENCOUNTER — Encounter (HOSPITAL_COMMUNITY): Payer: Self-pay | Admitting: Internal Medicine

## 2013-12-17 ENCOUNTER — Encounter (HOSPITAL_COMMUNITY): Payer: Self-pay | Admitting: Physical Therapy

## 2013-12-17 LAB — MDC_IDC_ENUM_SESS_TYPE_REMOTE
Battery Remaining Percentage: 95.5 %
Battery Voltage: 3.02 V
Brady Statistic AP VP Percent: 1.1 %
Brady Statistic AP VS Percent: 59 %
Brady Statistic AS VP Percent: 1 %
Brady Statistic RA Percent Paced: 41 %
Date Time Interrogation Session: 20151203070015
Implantable Pulse Generator Serial Number: 7586568
Lead Channel Impedance Value: 450 Ohm
Lead Channel Impedance Value: 660 Ohm
Lead Channel Pacing Threshold Amplitude: 0.75 V
Lead Channel Pacing Threshold Pulse Width: 0.4 ms
Lead Channel Sensing Intrinsic Amplitude: 12 mV
Lead Channel Sensing Intrinsic Amplitude: 2.5 mV
Lead Channel Setting Pacing Amplitude: 2.5 V
Lead Channel Setting Sensing Sensitivity: 2 mV
MDC IDC MSMT BATTERY REMAINING LONGEVITY: 116 mo
MDC IDC MSMT LEADCHNL RV PACING THRESHOLD AMPLITUDE: 0.75 V
MDC IDC MSMT LEADCHNL RV PACING THRESHOLD PULSEWIDTH: 0.4 ms
MDC IDC SET LEADCHNL RA PACING AMPLITUDE: 2 V
MDC IDC SET LEADCHNL RV PACING PULSEWIDTH: 0.4 ms
MDC IDC STAT BRADY AS VS PERCENT: 27 %
MDC IDC STAT BRADY RV PERCENT PACED: 1.1 %

## 2013-12-17 NOTE — Therapy (Signed)
[facility] [address] [place], [place], [address] Phone: [phone]   Fax:  [phone]  Patient Details  Name: [name] MRN: [id number] Date of Birth: 09/12/1925  Encounter Date: 12/17/2013  PHYSICAL THERAPY DISCHARGE SUMMARY  Visits from Start of Care: 3  All goals not met  PT Short Term Goals Time to Complete Short Term Goals: 4 weeks PT Short Term Goal 1: Patient will report drinking >7 cups of water daily to stay properly hydrated to decrease risk of orthostatic hypotension PT Short Term Goal 2: Patient will be able to demosntrate glute strength >2+/5 to be able to sit to stand without use of UE and with complaint of no more than mild dzziness PT Short Term Goal 3: Patient will report falling < 1x per 2 weeks to decrease risk of injury  PT Short Term Goal 4: patient will be able to perform 5x sit to stand in <15 seconds indicating patient not at high falls risk PT Long Term Goals Time to Complete Long Term Goals: 8 weeks PT Long Term Goal 1: Patient will display a BERG Balance score of >52 indicatign patient not at high falls risk PT Long Term Goal 2: Patient will be able to demosntrate glut max/med strength >3+/5 so patient can ambulate up and down stairs with only 1 HHA.  Long Term Goal 3: Patient will report not falling for >2 weeks.  Long Term Goal 4: Patient will be able to perform TUG in <15 seconds indicating patient not at high falls risk       Plan: Patient agrees to discharge.  Patient goals were not met. Patient is being discharged due to the patient's request.  ?????       Nettie Wyffels R 12/17/2013, 1:24 PM

## 2013-12-27 ENCOUNTER — Encounter: Payer: Self-pay | Admitting: Cardiology

## 2014-02-01 ENCOUNTER — Encounter: Payer: Self-pay | Admitting: Cardiovascular Disease

## 2014-02-01 ENCOUNTER — Ambulatory Visit (INDEPENDENT_AMBULATORY_CARE_PROVIDER_SITE_OTHER): Payer: Medicare Other | Admitting: Cardiovascular Disease

## 2014-02-01 VITALS — BP 132/90 | HR 97 | Wt 123.0 lb

## 2014-02-01 DIAGNOSIS — R002 Palpitations: Secondary | ICD-10-CM

## 2014-02-01 DIAGNOSIS — R42 Dizziness and giddiness: Secondary | ICD-10-CM

## 2014-02-01 DIAGNOSIS — I25810 Atherosclerosis of coronary artery bypass graft(s) without angina pectoris: Secondary | ICD-10-CM

## 2014-02-01 DIAGNOSIS — E785 Hyperlipidemia, unspecified: Secondary | ICD-10-CM

## 2014-02-01 DIAGNOSIS — R079 Chest pain, unspecified: Secondary | ICD-10-CM

## 2014-02-01 DIAGNOSIS — Z950 Presence of cardiac pacemaker: Secondary | ICD-10-CM

## 2014-02-01 DIAGNOSIS — I495 Sick sinus syndrome: Secondary | ICD-10-CM

## 2014-02-01 DIAGNOSIS — R072 Precordial pain: Secondary | ICD-10-CM

## 2014-02-01 DIAGNOSIS — Z713 Dietary counseling and surveillance: Secondary | ICD-10-CM

## 2014-02-01 DIAGNOSIS — I493 Ventricular premature depolarization: Secondary | ICD-10-CM

## 2014-02-01 DIAGNOSIS — I10 Essential (primary) hypertension: Secondary | ICD-10-CM

## 2014-02-01 MED ORDER — ISOSORBIDE MONONITRATE ER 30 MG PO TB24: 30.0000 mg | ORAL_TABLET | Freq: Every day | ORAL | Status: DC

## 2014-02-01 NOTE — Addendum Note (Signed)
Addended by: Barbarann Ehlers A on: 02/01/2014 08:57 AM   Modules accepted: Orders

## 2014-02-01 NOTE — Patient Instructions (Addendum)
Your physician wants you to follow-up in: 6 months with Dr.Koneswaran You will receive a reminder letter in the mail two months in advance. If you don't receive a letter, please call our office to schedule the follow-up appointment.      Please take Imdur 30 mg at bedtime.      Thank you for choosing [facility] !

## 2014-02-01 NOTE — Addendum Note (Signed)
Addended by: Barbarann Ehlers A on: 02/01/2014 09:19 AM   Modules accepted: Orders

## 2014-02-01 NOTE — Progress Notes (Addendum)
Patient ID: [name], female   DOB: February 17, 1925, 81 y.o.   MRN: [id number]      SUBJECTIVE: The patient presents for routine follow up. She has a history of CAD and CABG. She has chronic dizziness. She has problems with short term memory as well. Her most recent nuclear stress test was in November 2013 which showed no evidence of ischemia.  She had a pacemaker placed for tachycardia-bradycardia syndrome by Dr. Rayann Heman. She also has outflow tract PVCs for which she was started on metoprolol, but this was discontinued by her PCP as it didn't make her feel well.   Echo in 01/2013 demonstrated the following:  Mild LVH with LVEF 15-72%, grade 2 diastolic dysfunction. Moderate left atrial enlargement. MIldly thickened mtiral leaflets with mild, posteriorly directed mtiral regurgitation. Mildly sclerotic aortic valve with trivial aortic regurgitation. Mild RV dilatation. Mild to moderate tricuspid regurgitation with moderate pulmonary hypertension, PASP 52 mmHg.  She has been doing well. Other than her chronic dizziness, she feels well. She does mention episodic chest pain, and this too only if she overdoes it. She has not taken SL nitroglycerin. Her husband says his wife's chest pain occurs on a near daily basis.  Her daughter says her mother drinks very little water. ECG performed in the office today demonstrates sinus rhythm with frequent PVCs and atrial pacing.   Review of Systems: As per "subjective", otherwise negative.  Allergies  Allergen Reactions  . Cephalexin Hives  . Contrast Media [Iodinated Diagnostic Agents]     Unknown  . Iohexol     Urticaria, emesis, numbness during IVP performed at [facility] ago  . Penicillins Hives  . Shellfish Allergy Other (See Comments)    Unknown  . Sulfa Antibiotics Hives    Current Outpatient Prescriptions  Medication Sig Dispense Refill  . acetaminophen (TYLENOL) 325 MG tablet Take 650 mg by mouth every 6 (six) hours as  needed for mild pain. For pain    . alendronate (FOSAMAX) 70 MG tablet Take 70 mg by mouth once a week. Take with a full glass of water on an empty stomach.    Marland Kitchen aspirin EC 81 MG tablet Take 81 mg by mouth daily.    Marland Kitchen atorvastatin (LIPITOR) 10 MG tablet Take 10 mg by mouth at bedtime.    Marland Kitchen atorvastatin (LIPITOR) 10 MG tablet TAKE ONE (1) TABLET EACH DAY 30 tablet 3  . Calcium-Vitamin D (CALTRATE 600 PLUS-VIT D PO) Take 1 tablet by mouth 2 (two) times daily.     Marland Kitchen donepezil (ARICEPT) 10 MG tablet Take 10 mg by mouth daily.     . meclizine (ANTIVERT) 25 MG tablet Take 25 mg by mouth 3 (three) times daily as needed for dizziness.    . nitroGLYCERIN (NITROSTAT) 0.4 MG SL tablet Place 1 tablet (0.4 mg total) under the tongue every 5 (five) minutes as needed for chest pain. 25 tablet 6  . Omega-3 Fatty Acids (FISH OIL) 1200 MG CAPS Take 1,200 mg by mouth 2 (two) times daily.     . potassium chloride (K-DUR) 10 MEQ tablet Take 2 tablets (20 mEq total) by mouth daily. 60 tablet 3  . vitamin B-12 (CYANOCOBALAMIN) 1000 MCG tablet Take 1,000 mcg by mouth daily.      No current facility-administered medications for this visit.    Past Medical History  Diagnosis Date  . CAD (coronary artery disease)     CABG surgery in 06/2005 for left main disease; normal EF; negative  stress nuclear 08/2008  . Cerebrovascular disease     1993-CVA; 08/2008-mild atherosclerosis without focal stenosis  . Syncope      Negative even recorder dated 3/08  . Tobacco abuse     60 pack years; continuing  . Orthostatic hypotension   . Diverticulosis   . Osteopenia   . GERD (gastroesophageal reflux disease)   . Hypertension   . Mental disorder     mild dementia  . Anemia   . Sinus bradycardia     s/p STJ Assurity dual chamber pacemaker by Dr Rayann Heman 02/2013  . Frequent PVCs   . Family history of anesthesia complication     "makes daughter sick" (02/09/2013)  . High cholesterol   . Asthma   . Chronic bronchitis      "haven't had it in last 2-3 years; used to get it q yr" (02/09/2013)  . Pneumonia     "couple times; several years ago" (02/09/2013)  . JQZESPQZ(300.7)     "sometimes weekly" (02/09/2013)  . Arthritis     "fingers; back" (02/09/2013)  . Chronic back pain greater than 3 months duration     "nerve damage down into left leg" (02/09/2013)  . Frequent UTI   . Dementia     "more than mild; having more short term memory loss recently; on Aricept" (02/09/2013)    Past Surgical History  Procedure Laterality Date  . Abdominal hysterectomy  1990  . Ventral hernia repair    . Appendectomy    . Tonsillectomy    . Colonoscopy  10/10/2011    Procedure: COLONOSCOPY;  Surgeon: Rogene Houston, MD;  [facility];  Service: Endoscopy;  Laterality: N/A;  1230  . Esophagogastroduodenoscopy  10/10/2011    Procedure: ESOPHAGOGASTRODUODENOSCOPY (EGD);  Surgeon: Rogene Houston, MD;  [facility]: [facility];  Service: Endoscopy;  Laterality: N/A;  . Pacemaker insertion  02/09/2013    STJ Assurity pacemaker implanted by Dr Rayann Heman for symptomatic bradycardia  . Hernia repair    . Coronary artery bypass graft  2007    Left main disease  . Cardiac catheterization  2007  . Cataract extraction, bilateral    . Permanent pacemaker insertion N/A 02/09/2013    Procedure: PERMANENT PACEMAKER INSERTION;  Surgeon: Coralyn Mark, MD;  Location: [facility];  Service: Cardiovascular;  Laterality: N/A;    History   Social History  . Marital Status: Married    Spouse Name: Percell Miller     Number of Children: N/A  . Years of Education: 12   Occupational History  . retired    Social History Main Topics  . Smoking status: Former Smoker -- 0.50 packs/day for 60 years    Types: Cigarettes    Quit date: 10/20/2006  . Smokeless tobacco: Never Used     Comment: 02/09/2013 "quit smoking in ~ 2009"  . Alcohol Use: No  . Drug Use: No  . Sexual Activity: No   Other Topics Concern  . Not on file   Social History Narrative    3 children    Right handed   HS    Drinks decaff    BP 132/90  Pulse 72 bpm (by auscultation) SpO2 95% Weight 123 lb (55.792 kg)    PHYSICAL EXAM General: NAD HEENT: Normal. Neck: No JVD, no thyromegaly. Lungs: Clear to auscultation bilaterally with normal respiratory effort. CV: Nondisplaced PMI.  Irregular rhythm, normal S1/S2, no S3, no murmur. No pretibial or periankle edema.  No carotid bruit.  Normal pedal pulses.  Abdomen: Soft, nontender, no hepatosplenomegaly, no distention.  Neurologic: Alert and oriented x 3.  Psych: Normal affect. Skin: Normal. Musculoskeletal: Normal range of motion, no gross deformities. Extremities: No clubbing or cyanosis.   ECG: Most recent ECG reviewed.      ASSESSMENT AND PLAN: 1. Sick sinus syndrome s/p pacemaker: Normal device function on 12/17/13 with 9 mode switches, all less than one minute and no high ventricular rates. Follows with Dr. Rayann Heman. If she has clear atrial fibrillation in the future, anticoagulation will be considered but not at this time. 2. CAD s/p CABG with episodic chest pain: Will start Imdur 30 mg daily. Continue ASA and Lipitor. 3. Essential HTN: Borderline controlled without antihypertensive therapy.  4. Hyperlipidemia: On Lipitor and fish oil.  5. PVC's: Unable to tolerate metoprolol.  6. Chronic dizziness: Adequate fluid intake again emphasized.   Dispo: f/u 6 months.   Kate Sable, M.D., F.A.C.C.

## 2014-02-03 ENCOUNTER — Other Ambulatory Visit: Payer: Self-pay | Admitting: Cardiovascular Disease

## 2014-02-03 MED ORDER — NITROGLYCERIN 0.4 MG SL SUBL: 0.4000 mg | SUBLINGUAL_TABLET | SUBLINGUAL | Status: DC | PRN

## 2014-02-03 NOTE — Telephone Encounter (Signed)
Nitrostat 0.4 mg / last bottle from 2014 from Dr. Lanny Cramp / needs new RX / tgs

## 2014-02-03 NOTE — Telephone Encounter (Signed)
Refill complete via escribe

## 2014-03-14 ENCOUNTER — Ambulatory Visit (INDEPENDENT_AMBULATORY_CARE_PROVIDER_SITE_OTHER): Payer: Medicare Other | Admitting: *Deleted

## 2014-03-14 ENCOUNTER — Encounter: Payer: Self-pay | Admitting: Internal Medicine

## 2014-03-14 DIAGNOSIS — I495 Sick sinus syndrome: Secondary | ICD-10-CM

## 2014-03-14 NOTE — Progress Notes (Signed)
Remote pacemaker transmission.   

## 2014-03-17 ENCOUNTER — Telehealth: Payer: Self-pay | Admitting: *Deleted

## 2014-03-17 LAB — MDC_IDC_ENUM_SESS_TYPE_REMOTE
Battery Remaining Longevity: 113 mo
Battery Remaining Percentage: 95.5 %
Battery Voltage: 3.02 V
Brady Statistic AP VP Percent: 1 %
Brady Statistic AP VS Percent: 59 %
Brady Statistic AS VP Percent: 1 %
Date Time Interrogation Session: 20160307070014
Implantable Pulse Generator Model: 2240
Implantable Pulse Generator Serial Number: 7586568
Lead Channel Impedance Value: 400 Ohm
Lead Channel Impedance Value: 640 Ohm
Lead Channel Pacing Threshold Amplitude: 0.75 V
Lead Channel Pacing Threshold Pulse Width: 0.4 ms
Lead Channel Pacing Threshold Pulse Width: 0.4 ms
Lead Channel Sensing Intrinsic Amplitude: 2.1 mV
Lead Channel Setting Pacing Amplitude: 2 V
Lead Channel Setting Pacing Amplitude: 2.5 V
Lead Channel Setting Pacing Pulse Width: 0.4 ms
Lead Channel Setting Sensing Sensitivity: 2 mV
MDC IDC MSMT LEADCHNL RV PACING THRESHOLD AMPLITUDE: 0.75 V
MDC IDC MSMT LEADCHNL RV SENSING INTR AMPL: 12 mV
MDC IDC STAT BRADY AS VS PERCENT: 28 %
MDC IDC STAT BRADY RA PERCENT PACED: 43 %
MDC IDC STAT BRADY RV PERCENT PACED: 1 %

## 2014-03-17 NOTE — Telephone Encounter (Signed)
I called pt to confirm appt in May w/ Dr. Lovena Le in [place] instead of Dr. Rayann Heman in [place]. Pt's daughter clarified that Dr. Lovena Le follows her father's device (pt's husband). Family prefers to have Dr. Lovena Le following both pacers.

## 2014-03-21 ENCOUNTER — Encounter: Payer: Self-pay | Admitting: Cardiology

## 2014-03-31 ENCOUNTER — Encounter: Payer: Self-pay | Admitting: Internal Medicine

## 2014-04-20 ENCOUNTER — Other Ambulatory Visit: Payer: Self-pay | Admitting: Cardiovascular Disease

## 2014-05-27 ENCOUNTER — Ambulatory Visit (INDEPENDENT_AMBULATORY_CARE_PROVIDER_SITE_OTHER): Payer: Medicare Other | Admitting: Internal Medicine

## 2014-05-27 ENCOUNTER — Encounter: Payer: Medicare Other | Admitting: Internal Medicine

## 2014-05-27 ENCOUNTER — Encounter: Payer: Self-pay | Admitting: Internal Medicine

## 2014-05-27 VITALS — BP 128/74 | HR 93 | Ht 62.0 in | Wt 121.0 lb

## 2014-05-27 DIAGNOSIS — I495 Sick sinus syndrome: Secondary | ICD-10-CM

## 2014-05-27 DIAGNOSIS — I2510 Atherosclerotic heart disease of native coronary artery without angina pectoris: Secondary | ICD-10-CM

## 2014-05-27 DIAGNOSIS — Z950 Presence of cardiac pacemaker: Secondary | ICD-10-CM

## 2014-05-27 DIAGNOSIS — I951 Orthostatic hypotension: Secondary | ICD-10-CM

## 2014-05-27 DIAGNOSIS — I10 Essential (primary) hypertension: Secondary | ICD-10-CM

## 2014-05-27 LAB — CUP PACEART INCLINIC DEVICE CHECK
Battery Remaining Longevity: 123.6 mo
Battery Voltage: 3.02 V
Brady Statistic RA Percent Paced: 43 %
Date Time Interrogation Session: 20160520084113
Lead Channel Impedance Value: 587.5 Ohm
Lead Channel Pacing Threshold Amplitude: 0.5 V
Lead Channel Pacing Threshold Amplitude: 1 V
Lead Channel Pacing Threshold Pulse Width: 0.4 ms
Lead Channel Sensing Intrinsic Amplitude: 1.8 mV
Lead Channel Sensing Intrinsic Amplitude: 12 mV
Lead Channel Setting Pacing Amplitude: 2 V
Lead Channel Setting Pacing Amplitude: 2.5 V
Lead Channel Setting Pacing Pulse Width: 0.4 ms
Lead Channel Setting Sensing Sensitivity: 2 mV
MDC IDC MSMT LEADCHNL RA IMPEDANCE VALUE: 425 Ohm
MDC IDC MSMT LEADCHNL RA PACING THRESHOLD PULSEWIDTH: 0.4 ms
MDC IDC STAT BRADY RV PERCENT PACED: 0.69 %
Pulse Gen Serial Number: 7586568

## 2014-05-27 NOTE — Assessment & Plan Note (Signed)
Her St. Jude DDD PM is working normally. Will recheck in several months.  

## 2014-05-27 NOTE — Assessment & Plan Note (Signed)
The patients symptoms get worse when she stands up. I have asked the patient to stop Imdur.

## 2014-05-27 NOTE — Assessment & Plan Note (Signed)
She is s/p CABG. She has had no anginal symptoms. She will use slntg rather than imdur. She is encouraged to increase her physical activity.

## 2014-05-27 NOTE — Assessment & Plan Note (Signed)
Her blood pressure is well controlled. With her orthostasis, I would prefer that she run a little higher on the systolic pressure.

## 2014-05-27 NOTE — Patient Instructions (Addendum)
Your physician wants you to follow-up in: 1 year Dr. Lovena Le. You will receive a reminder letter in the mail two months in advance. If you don't receive a letter, please call our office to schedule the follow-up appointment.  Remote monitoring is used to monitor your Pacemaker of ICD from home. This monitoring reduces the number of office visits required to check your device to one time per year. It allows [place] to keep an eye on the functioning of your device to ensure it is working properly. You are scheduled for a device check from home on 08/29/14. You may send your transmission at any time that day. If you have a wireless device, the transmission will be sent automatically. After your physician reviews your transmission, you will receive a postcard with your next transmission date.  Your physician has recommended you make the following change in your medication:   STOP IMDUR Thank you for choosing [facility]!

## 2014-05-27 NOTE — Progress Notes (Signed)
HPI [name] is referred today for ongoing evaluation of her PPM. She is a pleasant 81 yo woman with symptomatic sinus node dysfunction, s/p PPM insertion, along with CAD, s/p CABG. Her main complaint today is that she gets dizzy, especially when she stands up. The patient has tried multiple medications. She is on Imdur. No frank syncope. Allergies  Allergen Reactions  . Cephalexin Hives  . Contrast Media [Iodinated Diagnostic Agents]     Unknown  . Iohexol     Urticaria, emesis, numbness during IVP performed at [facility] years ago  . Penicillins Hives  . Shellfish Allergy Other (See Comments)    Unknown  . Sulfa Antibiotics Hives     Current Outpatient Prescriptions  Medication Sig Dispense Refill  . acetaminophen (TYLENOL) 325 MG tablet Take 650 mg by mouth every 6 (six) hours as needed for mild pain. For pain    . alendronate (FOSAMAX) 70 MG tablet Take 70 mg by mouth once a week. Take with a full glass of water on an empty stomach.    Marland Kitchen aspirin EC 81 MG tablet Take 81 mg by mouth daily.    Marland Kitchen atorvastatin (LIPITOR) 10 MG tablet TAKE ONE (1) TABLET EACH DAY 30 tablet 3  . Calcium-Vitamin D (CALTRATE 600 PLUS-VIT D PO) Take 1 tablet by mouth 2 (two) times daily.     Marland Kitchen dimenhyDRINATE (DRAMAMINE) 50 MG tablet Take 50 mg by mouth every 8 (eight) hours as needed.    . donepezil (ARICEPT) 10 MG tablet Take 10 mg by mouth daily.     . isosorbide mononitrate (IMDUR) 30 MG 24 hr tablet Take 1 tablet (30 mg total) by mouth daily. 90 tablet 3  . nitroGLYCERIN (NITROSTAT) 0.4 MG SL tablet Place 1 tablet (0.4 mg total) under the tongue every 5 (five) minutes as needed for chest pain. 25 tablet 3  . Omega-3 Fatty Acids (FISH OIL) 1200 MG CAPS Take 1,200 mg by mouth 2 (two) times daily.     . potassium chloride (K-DUR) 10 MEQ tablet Take 2 tablets (20 mEq total) by mouth daily. 60 tablet 3  . vitamin B-12 (CYANOCOBALAMIN) 1000 MCG tablet Take 1,000 mcg by mouth daily.      No  current facility-administered medications for this visit.     Past Medical History  Diagnosis Date  . CAD (coronary artery disease)     CABG surgery in 06/2005 for left main disease; normal EF; negative stress nuclear 08/2008  . Cerebrovascular disease     1993-CVA; 08/2008-mild atherosclerosis without focal stenosis  . Syncope      Negative even recorder dated 3/08  . Tobacco abuse     60 pack years; continuing  . Orthostatic hypotension   . Diverticulosis   . Osteopenia   . GERD (gastroesophageal reflux disease)   . Hypertension   . Mental disorder     mild dementia  . Anemia   . Sinus bradycardia     s/p STJ Assurity dual chamber pacemaker by Dr Rayann Heman 02/2013  . Frequent PVCs   . Family history of anesthesia complication     "makes daughter sick" (02/09/2013)  . High cholesterol   . Asthma   . Chronic bronchitis     "haven't had it in last 2-3 years; used to get it q yr" (02/09/2013)  . Pneumonia     "couple times; several years ago" (02/09/2013)  . VHQIONGE(952.8)     "sometimes weekly" (02/09/2013)  .  Arthritis     "fingers; back" (02/09/2013)  . Chronic back pain greater than 3 months duration     "nerve damage down into left leg" (02/09/2013)  . Frequent UTI   . Dementia     "more than mild; having more short term memory loss recently; on Aricept" (02/09/2013)    ROS:   All systems reviewed and negative except as noted in the HPI.   Past Surgical History  Procedure Laterality Date  . Abdominal hysterectomy  1990  . Ventral hernia repair    . Appendectomy    . Tonsillectomy    . Colonoscopy  10/10/2011    Procedure: COLONOSCOPY;  Surgeon: Rogene Houston, MD;  [facility];  Service: Endoscopy;  Laterality: N/A;  1230  . Esophagogastroduodenoscopy  10/10/2011    Procedure: ESOPHAGOGASTRODUODENOSCOPY (EGD);  Surgeon: Rogene Houston, MD;  [facility];  Service: Endoscopy;  Laterality: N/A;  . Pacemaker insertion  02/09/2013    STJ Assurity pacemaker  implanted by Dr Rayann Heman for symptomatic bradycardia  . Hernia repair    . Coronary artery bypass graft  2007    Left main disease  . Cardiac catheterization  2007  . Cataract extraction, bilateral    . Permanent pacemaker insertion N/A 02/09/2013    Procedure: PERMANENT PACEMAKER INSERTION;  Surgeon: Coralyn Mark, MD;  Location: [facility];  Service: Cardiovascular;  Laterality: N/A;     Family History  Problem Relation Age of Onset  . Heart disease    . Arthritis    . Lung disease    . Asthma       History   Social History  . Marital Status: Married    Spouse Name: Percell Miller   . Number of Children: N/A  . Years of Education: 12   Occupational History  . retired    Social History Main Topics  . Smoking status: Former Smoker -- 0.50 packs/day for 60 years    Types: Cigarettes    Start date: 12/12/1952    Quit date: 10/20/2006  . Smokeless tobacco: Never Used     Comment: 02/09/2013 "quit smoking in ~ 2009"  . Alcohol Use: No  . Drug Use: No  . Sexual Activity: No   Other Topics Concern  . Not on file   Social History Narrative   3 children    Right handed   HS    Drinks decaff     BP 128/74 mmHg  Pulse 93  Ht 5\' 2"  (1.575 m)  Wt 121 lb (54.885 kg)  BMI 22.13 kg/m2  SpO2 96%  Physical Exam:  Well appearing 81 yo woman, NAD HEENT: Unremarkable Neck:  6 cm JVD, no thyromegally Back:  No CVA tenderness Lungs:  Clear with no wheezes HEART:  Regular rate rhythm, no murmurs, no rubs, no clicks Abd:  soft, positive bowel sounds, no organomegally, no rebound, no guarding Ext:  2 plus pulses, no edema, no cyanosis, no clubbing Skin:  No rashes no nodules Neuro:  CN II through XII intact, motor grossly intact    DEVICE  Normal device function.  See PaceArt for details.   Assess/Plan:

## 2014-07-05 ENCOUNTER — Other Ambulatory Visit (HOSPITAL_COMMUNITY): Payer: Self-pay | Admitting: Family Medicine

## 2014-07-07 ENCOUNTER — Other Ambulatory Visit (HOSPITAL_COMMUNITY): Payer: Self-pay | Admitting: Family Medicine

## 2014-07-07 DIAGNOSIS — Z1231 Encounter for screening mammogram for malignant neoplasm of breast: Secondary | ICD-10-CM

## 2014-07-07 DIAGNOSIS — Z1389 Encounter for screening for other disorder: Secondary | ICD-10-CM

## 2014-07-07 DIAGNOSIS — M8580 Other specified disorders of bone density and structure, unspecified site: Secondary | ICD-10-CM

## 2014-07-25 ENCOUNTER — Ambulatory Visit (HOSPITAL_COMMUNITY)
Admission: RE | Admit: 2014-07-25 | Discharge: 2014-07-25 | Disposition: A | Discharge status: Home / Self Care | Payer: Medicare Other | Source: Ambulatory Visit | Attending: Family Medicine | Admitting: Family Medicine

## 2014-07-25 ENCOUNTER — Other Ambulatory Visit (HOSPITAL_COMMUNITY): Payer: Self-pay | Admitting: Family Medicine

## 2014-07-25 DIAGNOSIS — Z1231 Encounter for screening mammogram for malignant neoplasm of breast: Secondary | ICD-10-CM | POA: Insufficient documentation

## 2014-07-25 DIAGNOSIS — M810 Age-related osteoporosis without current pathological fracture: Secondary | ICD-10-CM | POA: Diagnosis not present

## 2014-07-25 DIAGNOSIS — Z1389 Encounter for screening for other disorder: Secondary | ICD-10-CM

## 2014-07-25 DIAGNOSIS — M8580 Other specified disorders of bone density and structure, unspecified site: Secondary | ICD-10-CM

## 2014-08-08 IMAGING — US US CAROTID DUPLEX BILAT
1 series · 13 of 24 positions shown · non-contrast
Comparison: Prior carotid Doppler ultrasound 08/30/2008

CLINICAL DATA: Carotid disease

BILATERAL CAROTID DUPLEX ULTRASOUND
TECHNIQUE: Gray scale imaging, color Doppler and duplex ultrasound
was performed of bilateral carotid and vertebral arteries in the
neck.

[Series 1: us carotid duplex bilat · 0.05mm/px · 13 of 68 slices shown]
[im 1/68]
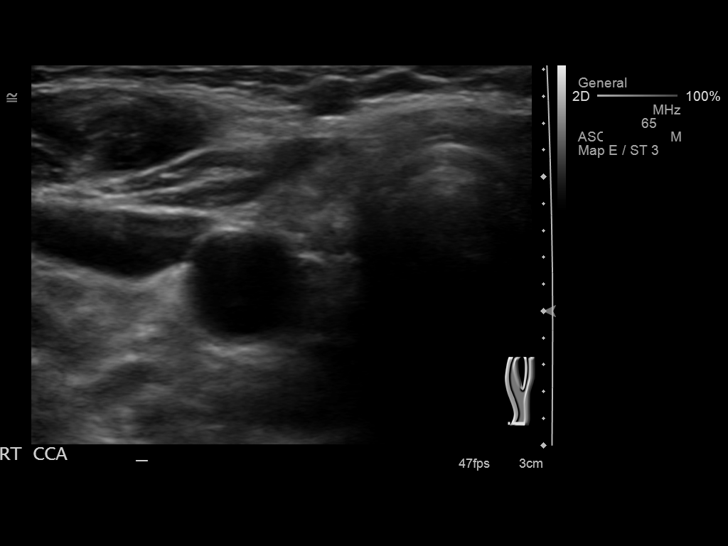
[im 6/68]
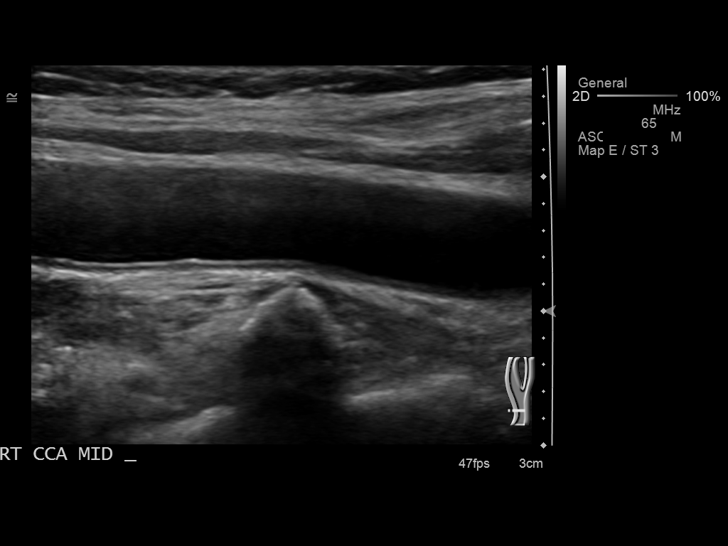
[im 12/68]
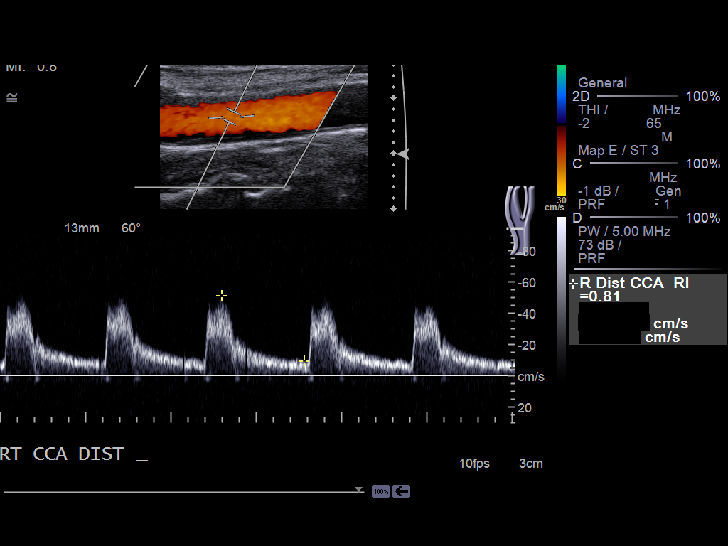
[im 18/68]
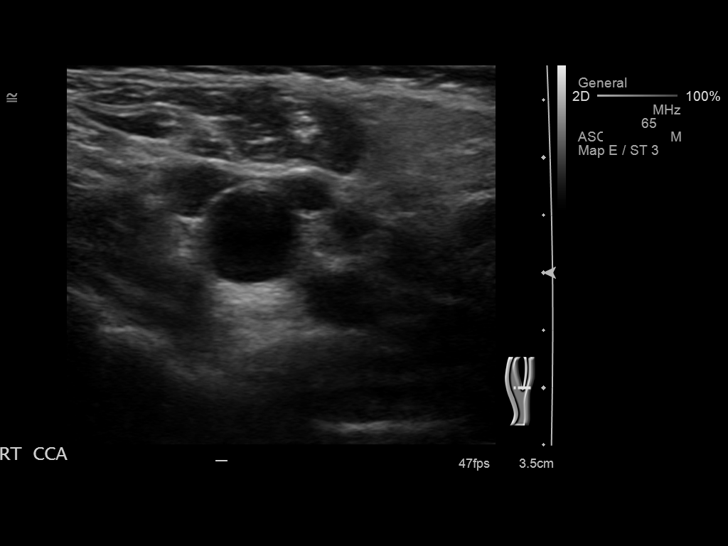
[im 24/68]
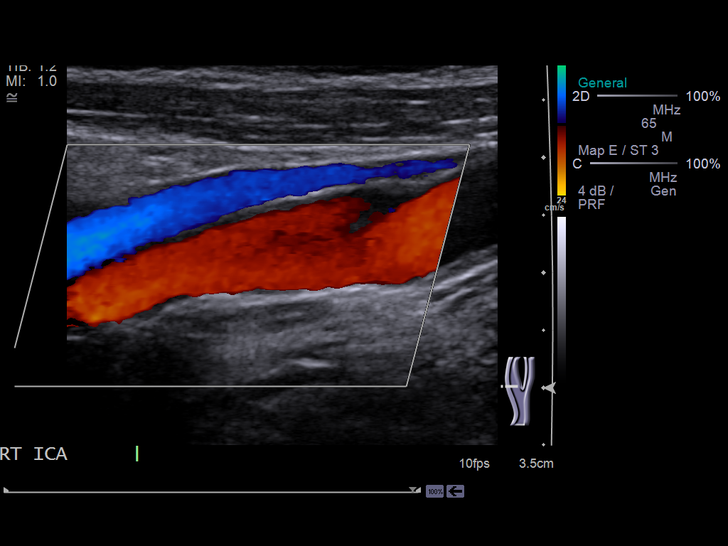
[im 30/68]
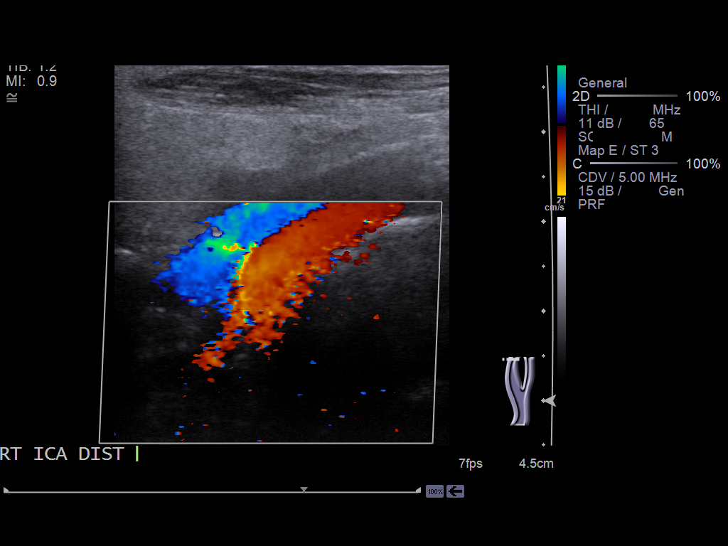
[im 35/68]
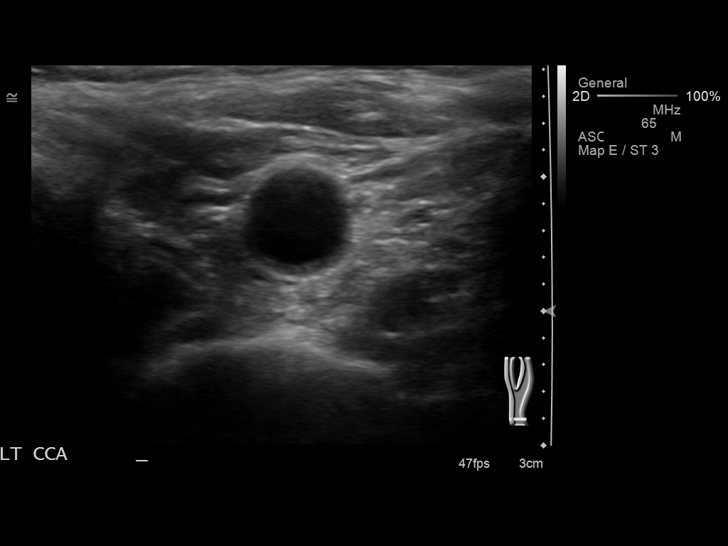
[im 38/68]
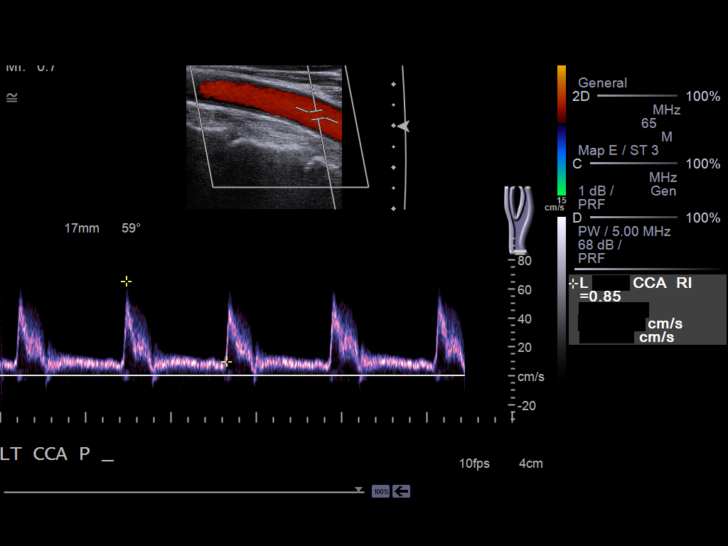
[im 44/68]
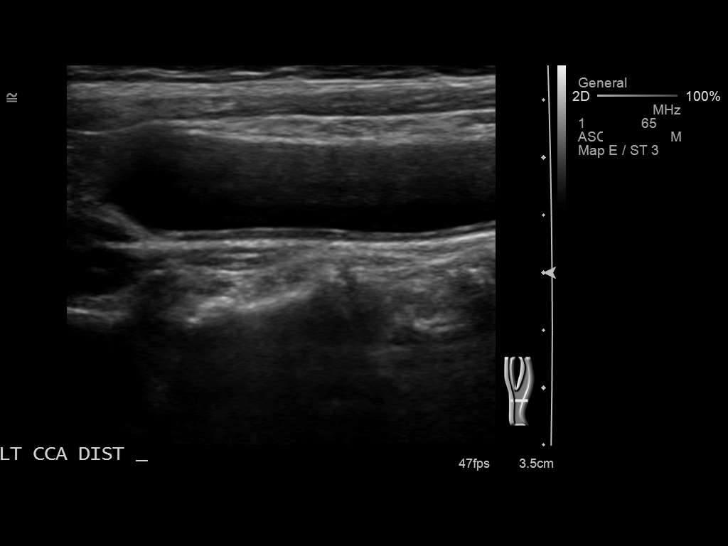
[im 50/68]
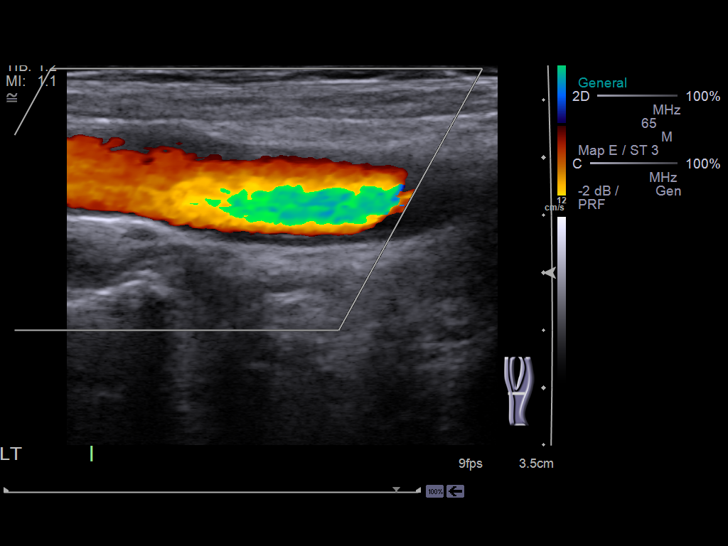
[im 56/68]
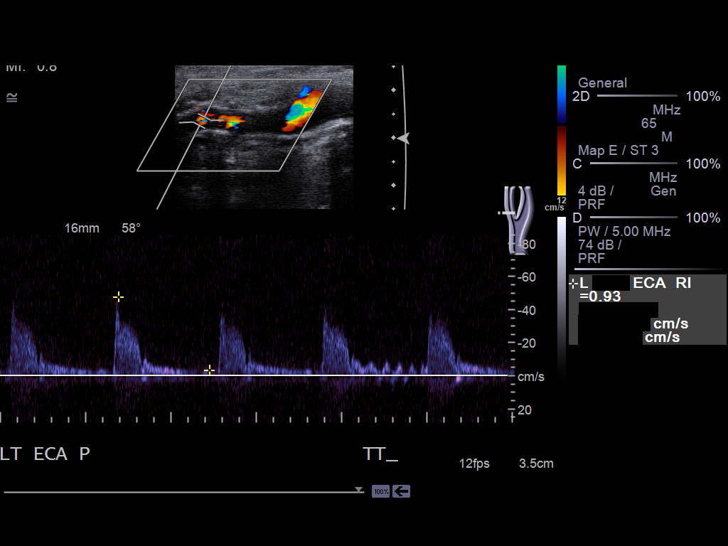
[im 62/68]
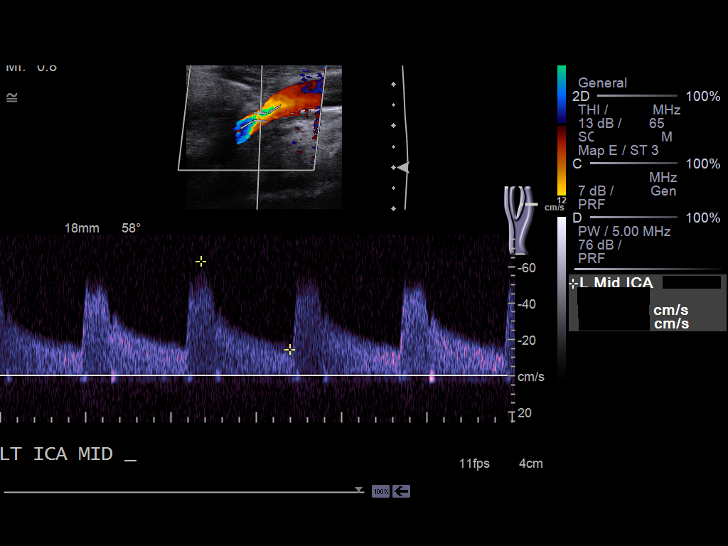
[im 68/68]
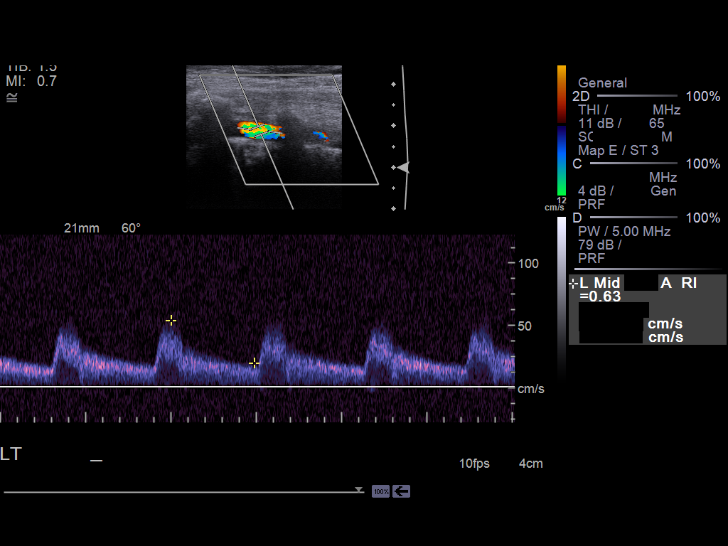

[13 of 24 positions shown; findings below may reference images not displayed]

Criteria:  Quantification of carotid stenosis is based on velocity
parameters that correlate the residual internal carotid diameter
with NASCET-based stenosis levels, using the diameter of the distal
internal carotid lumen as the denominator for stenosis measurement.

The following velocity measurements were obtained:

                 PEAK SYSTOLIC/END DIASTOLIC
RIGHT
ICA:                        90/23cm/sec
CCA:                        65/12cm/sec
SYSTOLIC ICA/CCA RATIO:
DIASTOLIC ICA/CCA RATIO:
ECA:                        86cm/sec

LEFT
ICA:                        71/18cm/sec
CCA:                        61/11cm/sec
SYSTOLIC ICA/CCA RATIO:
DIASTOLIC ICA/CCA RATIO:
ECA:                        48cm/sec
FINDINGS: RIGHT CAROTID ARTERY: Mild intimal medial thickening.  There is a
trace amount of atherosclerotic plaque in the carotid bulb.  No
evidence of significant stenosis by gray scale, color Doppler
spectral waveform analysis.  No significant interval progression.

RIGHT VERTEBRAL ARTERY:  Patent with normal antegrade flow.

LEFT CAROTID ARTERY: Mild intimal medial thickening.  Trace
atherosclerotic plaque in the carotid bulb.  No evidence of
significant stenosis by gray scale, color Doppler or spectral
waveform analysis.  No significant interval progression.

LEFT VERTEBRAL ARTERY:  Patent with normal antegrade flow.
IMPRESSION: 1.  Stable trace bilateral carotid bifurcation atherosclerotic
plaque without significant stenosis or interval progression.

2.  Vertebral arteries are patent bilaterally with normal antegrade
flow.

[REDACTED]

## 2014-08-29 ENCOUNTER — Ambulatory Visit (INDEPENDENT_AMBULATORY_CARE_PROVIDER_SITE_OTHER): Payer: Medicare Other | Admitting: *Deleted

## 2014-08-29 DIAGNOSIS — I495 Sick sinus syndrome: Secondary | ICD-10-CM

## 2014-08-30 NOTE — Progress Notes (Signed)
Remote pacemaker transmission.   

## 2014-09-02 LAB — CUP PACEART REMOTE DEVICE CHECK
Battery Remaining Longevity: 122 mo
Battery Remaining Percentage: 95.5 %
Brady Statistic AS VS Percent: 40 %
Brady Statistic RA Percent Paced: 38 %
Brady Statistic RV Percent Paced: 1 %
Date Time Interrogation Session: 20160822060015
Lead Channel Impedance Value: 440 Ohm
Lead Channel Pacing Threshold Amplitude: 0.5 V
Lead Channel Pacing Threshold Pulse Width: 0.4 ms
Lead Channel Setting Sensing Sensitivity: 2 mV
MDC IDC MSMT BATTERY VOLTAGE: 3.01 V
MDC IDC MSMT LEADCHNL RA SENSING INTR AMPL: 2.1 mV
MDC IDC MSMT LEADCHNL RV IMPEDANCE VALUE: 580 Ohm
MDC IDC MSMT LEADCHNL RV PACING THRESHOLD AMPLITUDE: 1 V
MDC IDC MSMT LEADCHNL RV PACING THRESHOLD PULSEWIDTH: 0.4 ms
MDC IDC MSMT LEADCHNL RV SENSING INTR AMPL: 12 mV
MDC IDC SET LEADCHNL RA PACING AMPLITUDE: 2 V
MDC IDC SET LEADCHNL RV PACING AMPLITUDE: 2.5 V
MDC IDC SET LEADCHNL RV PACING PULSEWIDTH: 0.4 ms
MDC IDC STAT BRADY AP VP PERCENT: 1 %
MDC IDC STAT BRADY AP VS PERCENT: 51 %
MDC IDC STAT BRADY AS VP PERCENT: 1 %
Pulse Gen Serial Number: 7586568

## 2014-09-08 ENCOUNTER — Encounter: Payer: Self-pay | Admitting: Cardiology

## 2014-09-22 ENCOUNTER — Other Ambulatory Visit: Payer: Self-pay | Admitting: Cardiovascular Disease

## 2014-09-22 ENCOUNTER — Encounter: Payer: Self-pay | Admitting: Internal Medicine

## 2014-10-20 ENCOUNTER — Encounter: Payer: Self-pay | Admitting: Cardiovascular Disease

## 2014-10-20 ENCOUNTER — Ambulatory Visit (INDEPENDENT_AMBULATORY_CARE_PROVIDER_SITE_OTHER): Payer: Medicare Other | Admitting: Cardiovascular Disease

## 2014-10-20 VITALS — BP 132/78 | HR 89 | Ht 64.0 in | Wt 122.4 lb

## 2014-10-20 DIAGNOSIS — I495 Sick sinus syndrome: Secondary | ICD-10-CM

## 2014-10-20 DIAGNOSIS — Z950 Presence of cardiac pacemaker: Secondary | ICD-10-CM | POA: Diagnosis not present

## 2014-10-20 DIAGNOSIS — R42 Dizziness and giddiness: Secondary | ICD-10-CM | POA: Diagnosis not present

## 2014-10-20 DIAGNOSIS — E785 Hyperlipidemia, unspecified: Secondary | ICD-10-CM

## 2014-10-20 DIAGNOSIS — R002 Palpitations: Secondary | ICD-10-CM

## 2014-10-20 DIAGNOSIS — I10 Essential (primary) hypertension: Secondary | ICD-10-CM

## 2014-10-20 DIAGNOSIS — R079 Chest pain, unspecified: Secondary | ICD-10-CM

## 2014-10-20 DIAGNOSIS — Z23 Encounter for immunization: Secondary | ICD-10-CM | POA: Diagnosis not present

## 2014-10-20 DIAGNOSIS — I25118 Atherosclerotic heart disease of native coronary artery with other forms of angina pectoris: Secondary | ICD-10-CM

## 2014-10-20 MED ORDER — OMEPRAZOLE 20 MG PO CPDR: 20.0000 mg | DELAYED_RELEASE_CAPSULE | Freq: Every day | ORAL | Status: DC

## 2014-10-20 NOTE — Progress Notes (Signed)
Patient ID: [name], female   DOB: 1925-04-25, 81 y.o.   MRN: [id number]      SUBJECTIVE: The patient presents for routine follow up. She has a history of CAD and CABG. She has chronic dizziness. She has problems with short term memory as well. Her most recent nuclear stress test was in November 2013 which showed no evidence of ischemia.  She had a pacemaker placed for tachycardia-bradycardia syndrome by Dr. Rayann Heman. She also has outflow tract PVCs for which she was started on metoprolol, but this was discontinued by her PCP as it didn't make her feel well.   Echo in 01/2013 demonstrated the following:  Mild LVH with LVEF 78-29%, grade 2 diastolic dysfunction. Moderate left atrial enlargement. MIldly thickened mtiral leaflets with mild, posteriorly directed mtiral regurgitation. Mildly sclerotic aortic valve with trivial aortic regurgitation. Mild RV dilatation. Mild to moderate tricuspid regurgitation with moderate pulmonary hypertension, PASP 52 mmHg.  Due to orthostasis and dizziness, Dr. Lovena Le stopped her Imdur in May. However, it appears she is still taking it. Her daughter reinforces the fact that her mother has had dizziness for several years, predating the use of Imdur.  The patient says she has chest pain if she significantly exerts herself. Her daughter says that her mother does not do much to begin with and that she herself does the cleaning in her parentss house. The patient also has dysphagia for solids with food getting caught up in her throat. Her daughter seems to think that her mother has probable significant acid reflux disease. She denies leg swelling.   Review of Systems: As per "subjective", otherwise negative.  Allergies  Allergen Reactions  . Cephalexin Hives  . Contrast Media [Iodinated Diagnostic Agents]     Unknown  . Iohexol     Urticaria, emesis, numbness during IVP performed at [facility] years ago  . Penicillins Hives  . Shellfish  Allergy Other (See Comments)    Unknown  . Sulfa Antibiotics Hives    Current Outpatient Prescriptions  Medication Sig Dispense Refill  . acetaminophen (TYLENOL) 325 MG tablet Take 650 mg by mouth every 6 (six) hours as needed for mild pain. For pain    . alendronate (FOSAMAX) 70 MG tablet Take 70 mg by mouth once a week. Take with a full glass of water on an empty stomach.    Marland Kitchen aspirin EC 81 MG tablet Take 81 mg by mouth daily.    Marland Kitchen atorvastatin (LIPITOR) 10 MG tablet TAKE ONE (1) TABLET EACH DAY 30 tablet 3  . Calcium-Vitamin D (CALTRATE 600 PLUS-VIT D PO) Take 1 tablet by mouth 2 (two) times daily.     Marland Kitchen dimenhyDRINATE (DRAMAMINE) 50 MG tablet Take 50 mg by mouth every 8 (eight) hours as needed.    . donepezil (ARICEPT) 10 MG tablet Take 10 mg by mouth daily.     . isosorbide mononitrate (IMDUR) 30 MG 24 hr tablet Take 1 tablet (30 mg total) by mouth daily. 90 tablet 3  . nitroGLYCERIN (NITROSTAT) 0.4 MG SL tablet Place 1 tablet (0.4 mg total) under the tongue every 5 (five) minutes as needed for chest pain. 25 tablet 3  . Omega-3 Fatty Acids (FISH OIL) 1200 MG CAPS Take 1,200 mg by mouth 2 (two) times daily.     . vitamin B-12 (CYANOCOBALAMIN) 1000 MCG tablet Take 1,000 mcg by mouth daily.      No current facility-administered medications for this visit.    Past Medical History  Diagnosis  Date  . CAD (coronary artery disease)     CABG surgery in 06/2005 for left main disease; normal EF; negative stress nuclear 08/2008  . Cerebrovascular disease     1993-CVA; 08/2008-mild atherosclerosis without focal stenosis  . Syncope      Negative even recorder dated 3/08  . Tobacco abuse     60 pack years; continuing  . Orthostatic hypotension   . Diverticulosis   . Osteopenia   . GERD (gastroesophageal reflux disease)   . Hypertension   . Mental disorder     mild dementia  . Anemia   . Sinus bradycardia     s/p STJ Assurity dual chamber pacemaker by Dr Rayann Heman 02/2013  . Frequent PVCs     . Family history of anesthesia complication     "makes daughter sick" (02/09/2013)  . High cholesterol   . Asthma   . Chronic bronchitis ([place])     "haven't had it in last 2-3 years; used to get it q yr" (02/09/2013)  . Pneumonia     "couple times; several years ago" (02/09/2013)  . MWNUUVOZ(366.4)     "sometimes weekly" (02/09/2013)  . Arthritis     "fingers; back" (02/09/2013)  . Chronic back pain greater than 3 months duration     "nerve damage down into left leg" (02/09/2013)  . Frequent UTI   . Dementia     "more than mild; having more short term memory loss recently; on Aricept" (02/09/2013)    Past Surgical History  Procedure Laterality Date  . Abdominal hysterectomy  1990  . Ventral hernia repair    . Appendectomy    . Tonsillectomy    . Colonoscopy  10/10/2011    Procedure: COLONOSCOPY;  Surgeon: Rogene Houston, MD;  Location: [facility];  Service: Endoscopy;  Laterality: N/A;  1230  . Esophagogastroduodenoscopy  10/10/2011    Procedure: ESOPHAGOGASTRODUODENOSCOPY (EGD);  Surgeon: Rogene Houston, MD;  Location: [facility];  Service: Endoscopy;  Laterality: N/A;  . Pacemaker insertion  02/09/2013    STJ Assurity pacemaker implanted by Dr Rayann Heman for symptomatic bradycardia  . Hernia repair    . Coronary artery bypass graft  2007    Left main disease  . Cardiac catheterization  2007  . Cataract extraction, bilateral    . Permanent pacemaker insertion N/A 02/09/2013    Procedure: PERMANENT PACEMAKER INSERTION;  Surgeon: Coralyn Mark, MD;  Location: [facility];  Service: Cardiovascular;  Laterality: N/A;    Social History   Social History  . Marital Status: Married    Spouse Name: Percell Miller   . Number of Children: N/A  . Years of Education: 12   Occupational History  . retired    Social History Main Topics  . Smoking status: Former Smoker -- 0.50 packs/day for 60 years    Types: Cigarettes    Start date: 12/12/1952    Quit date: 10/20/2006  . Smokeless tobacco: Never  Used     Comment: 02/09/2013 "quit smoking in ~ 2009"  . Alcohol Use: No  . Drug Use: No  . Sexual Activity: No   Other Topics Concern  . Not on file   Social History Narrative   3 children    Right handed   HS    Drinks decaff     Filed Vitals:   10/20/14 1126  BP: 132/78  Pulse: 89  Height: 5\' 4"  (1.626 m)  Weight: 122 lb 6.4 oz (55.52 kg)  SpO2: 99%  PHYSICAL EXAM General: NAD HEENT: Normal. Neck: No JVD, no thyromegaly. Lungs: Clear to auscultation bilaterally with normal respiratory effort. CV: Nondisplaced PMI.  Regular rate and rhythm with extra systoles noted, normal S1/S2, no S3/S4, no murmur. No pretibial or periankle edema.   Abdomen: Soft, nontender, no distention.  Neurologic: Alert and oriented.  Psych: Normal affect. Skin: Normal. Musculoskeletal: Normal range of motion, no gross deformities. Extremities: No clubbing or cyanosis.   ECG: Most recent ECG reviewed.      ASSESSMENT AND PLAN: 1. Sick sinus syndrome s/p pacemaker: Normal device function on 08/29/14 with 6 mode switches and no high ventricular rates. Follows with Dr. Lovena Le.   2. CAD s/p CABG with chest pain: This may be multifactorial in etiology with both GERD and ischemic heart disease contributing. Will continue Imdur 30 mg daily for now but would consider increasing in future. Continue ASA and Lipitor.  3. Essential HTN: Controlled. No changes.  4. Hyperlipidemia: On Lipitor and fish oil. Will check lipids.  5. PVC's: Unable to tolerate metoprolol.   6. Chronic dizziness: Adequate fluid intake again emphasized.   7. GERD: Has dysphagia for solids. Will prescribe omeprazole 20 mg daily. May eventually need EGD.  Dispo: f/u 4 months.   Kate Sable, M.D., F.A.C.C.

## 2014-10-20 NOTE — Patient Instructions (Signed)
Your physician wants you to follow-up in: 4 months with Dr Virgina Jock will receive a reminder letter in the mail two months in advance. If you don't receive a letter, please call our office to schedule the follow-up appointment.      START Omeprazole 20 mg daily      Thank you for choosing [facility] !

## 2014-10-21 ENCOUNTER — Other Ambulatory Visit: Payer: Self-pay | Admitting: Cardiovascular Disease

## 2014-10-25 DIAGNOSIS — Z23 Encounter for immunization: Secondary | ICD-10-CM | POA: Diagnosis not present

## 2014-11-30 ENCOUNTER — Ambulatory Visit (INDEPENDENT_AMBULATORY_CARE_PROVIDER_SITE_OTHER): Payer: Medicare Other | Admitting: *Deleted

## 2014-11-30 DIAGNOSIS — I495 Sick sinus syndrome: Secondary | ICD-10-CM

## 2014-11-30 NOTE — Progress Notes (Signed)
Remote pacemaker transmission.   

## 2014-12-12 LAB — CUP PACEART REMOTE DEVICE CHECK
Battery Remaining Percentage: 95.5 %
Battery Voltage: 3.01 V
Brady Statistic AP VP Percent: 1 %
Brady Statistic AP VS Percent: 53 %
Brady Statistic AS VS Percent: 38 %
Implantable Lead Implant Date: 20150203
Implantable Lead Location: 753859
Implantable Lead Location: 753860
Lead Channel Impedance Value: 480 Ohm
Lead Channel Impedance Value: 590 Ohm
Lead Channel Pacing Threshold Amplitude: 0.5 V
Lead Channel Pacing Threshold Amplitude: 1 V
Lead Channel Pacing Threshold Pulse Width: 0.4 ms
Lead Channel Pacing Threshold Pulse Width: 0.4 ms
Lead Channel Sensing Intrinsic Amplitude: 2.2 mV
Lead Channel Setting Pacing Amplitude: 2 V
Lead Channel Setting Pacing Amplitude: 2.5 V
Lead Channel Setting Pacing Pulse Width: 0.4 ms
MDC IDC LEAD IMPLANT DT: 20150203
MDC IDC MSMT BATTERY REMAINING LONGEVITY: 122 mo
MDC IDC MSMT LEADCHNL RV SENSING INTR AMPL: 11.8 mV
MDC IDC SESS DTM: 20161123070012
MDC IDC SET LEADCHNL RV SENSING SENSITIVITY: 2 mV
MDC IDC STAT BRADY AS VP PERCENT: 1 %
MDC IDC STAT BRADY RA PERCENT PACED: 38 %
MDC IDC STAT BRADY RV PERCENT PACED: 1 %
Pulse Gen Serial Number: 7586568

## 2014-12-13 ENCOUNTER — Encounter: Payer: Self-pay | Admitting: Cardiology

## 2015-01-05 DIAGNOSIS — R350 Frequency of micturition: Secondary | ICD-10-CM | POA: Diagnosis not present

## 2015-01-05 DIAGNOSIS — Z6822 Body mass index (BMI) 22.0-22.9, adult: Secondary | ICD-10-CM | POA: Diagnosis not present

## 2015-01-05 DIAGNOSIS — L603 Nail dystrophy: Secondary | ICD-10-CM | POA: Diagnosis not present

## 2015-01-05 DIAGNOSIS — Z1389 Encounter for screening for other disorder: Secondary | ICD-10-CM | POA: Diagnosis not present

## 2015-02-07 ENCOUNTER — Other Ambulatory Visit: Payer: Self-pay | Admitting: Cardiovascular Disease

## 2015-02-13 ENCOUNTER — Encounter (HOSPITAL_COMMUNITY): Payer: Self-pay | Admitting: Emergency Medicine

## 2015-02-13 ENCOUNTER — Emergency Department (HOSPITAL_COMMUNITY)
Admission: EM | Admit: 2015-02-13 | Discharge: 2015-02-13 | Disposition: A | Discharge status: Home / Self Care | Payer: Medicare Other | Attending: Emergency Medicine | Admitting: Emergency Medicine

## 2015-02-13 ENCOUNTER — Emergency Department (HOSPITAL_COMMUNITY): Payer: Medicare Other

## 2015-02-13 DIAGNOSIS — L602 Onychogryphosis: Secondary | ICD-10-CM | POA: Diagnosis not present

## 2015-02-13 DIAGNOSIS — Z7982 Long term (current) use of aspirin: Secondary | ICD-10-CM | POA: Diagnosis not present

## 2015-02-13 DIAGNOSIS — Z87440 Personal history of urinary (tract) infections: Secondary | ICD-10-CM | POA: Diagnosis not present

## 2015-02-13 DIAGNOSIS — M19031 Primary osteoarthritis, right wrist: Secondary | ICD-10-CM | POA: Insufficient documentation

## 2015-02-13 DIAGNOSIS — Z9889 Other specified postprocedural states: Secondary | ICD-10-CM | POA: Insufficient documentation

## 2015-02-13 DIAGNOSIS — L03119 Cellulitis of unspecified part of limb: Secondary | ICD-10-CM

## 2015-02-13 DIAGNOSIS — Z8719 Personal history of other diseases of the digestive system: Secondary | ICD-10-CM | POA: Diagnosis not present

## 2015-02-13 DIAGNOSIS — I10 Essential (primary) hypertension: Secondary | ICD-10-CM | POA: Insufficient documentation

## 2015-02-13 DIAGNOSIS — M79641 Pain in right hand: Secondary | ICD-10-CM | POA: Diagnosis present

## 2015-02-13 DIAGNOSIS — J45909 Unspecified asthma, uncomplicated: Secondary | ICD-10-CM | POA: Insufficient documentation

## 2015-02-13 DIAGNOSIS — M1990 Unspecified osteoarthritis, unspecified site: Secondary | ICD-10-CM | POA: Insufficient documentation

## 2015-02-13 DIAGNOSIS — F0390 Unspecified dementia without behavioral disturbance: Secondary | ICD-10-CM | POA: Insufficient documentation

## 2015-02-13 DIAGNOSIS — L03113 Cellulitis of right upper limb: Secondary | ICD-10-CM | POA: Insufficient documentation

## 2015-02-13 DIAGNOSIS — G8929 Other chronic pain: Secondary | ICD-10-CM | POA: Diagnosis not present

## 2015-02-13 DIAGNOSIS — Z8673 Personal history of transient ischemic attack (TIA), and cerebral infarction without residual deficits: Secondary | ICD-10-CM | POA: Insufficient documentation

## 2015-02-13 DIAGNOSIS — Z880 Allergy status to penicillin: Secondary | ICD-10-CM | POA: Diagnosis not present

## 2015-02-13 DIAGNOSIS — Z862 Personal history of diseases of the blood and blood-forming organs and certain disorders involving the immune mechanism: Secondary | ICD-10-CM | POA: Insufficient documentation

## 2015-02-13 DIAGNOSIS — I2510 Atherosclerotic heart disease of native coronary artery without angina pectoris: Secondary | ICD-10-CM | POA: Insufficient documentation

## 2015-02-13 DIAGNOSIS — Z87891 Personal history of nicotine dependence: Secondary | ICD-10-CM | POA: Insufficient documentation

## 2015-02-13 DIAGNOSIS — Z79899 Other long term (current) drug therapy: Secondary | ICD-10-CM | POA: Diagnosis not present

## 2015-02-13 DIAGNOSIS — L84 Corns and callosities: Secondary | ICD-10-CM | POA: Diagnosis not present

## 2015-02-13 DIAGNOSIS — M2042 Other hammer toe(s) (acquired), left foot: Secondary | ICD-10-CM | POA: Diagnosis not present

## 2015-02-13 DIAGNOSIS — Z951 Presence of aortocoronary bypass graft: Secondary | ICD-10-CM | POA: Diagnosis not present

## 2015-02-13 DIAGNOSIS — E7800 Pure hypercholesterolemia, unspecified: Secondary | ICD-10-CM | POA: Insufficient documentation

## 2015-02-13 DIAGNOSIS — M79675 Pain in left toe(s): Secondary | ICD-10-CM | POA: Diagnosis not present

## 2015-02-13 DIAGNOSIS — L03114 Cellulitis of left upper limb: Secondary | ICD-10-CM | POA: Diagnosis not present

## 2015-02-13 DIAGNOSIS — M7989 Other specified soft tissue disorders: Secondary | ICD-10-CM | POA: Diagnosis not present

## 2015-02-13 MED ORDER — CLINDAMYCIN HCL 300 MG PO CAPS: ORAL_CAPSULE | ORAL | Status: DC

## 2015-02-13 MED ORDER — ONDANSETRON HCL 4 MG PO TABS
4.0000 mg | ORAL_TABLET | Freq: Once | ORAL | Status: AC
  Administered 2015-02-13: 4 mg via ORAL
  Filled 2015-02-13: qty 1

## 2015-02-13 MED ORDER — ACETAMINOPHEN 325 MG PO TABS
650.0000 mg | ORAL_TABLET | Freq: Once | ORAL | Status: AC
  Administered 2015-02-13: 650 mg via ORAL
  Filled 2015-02-13: qty 2

## 2015-02-13 MED ORDER — CLINDAMYCIN HCL 150 MG PO CAPS
300.0000 mg | ORAL_CAPSULE | Freq: Once | ORAL | Status: AC
  Administered 2015-02-13: 300 mg via ORAL
  Filled 2015-02-13: qty 2

## 2015-02-13 NOTE — ED Notes (Signed)
Pt with edema to her R hand, no known injury. Per family pt was ok yesterday, does not remember falling. Pt with hx dementia.

## 2015-02-13 NOTE — Discharge Instructions (Signed)
Please see Dr Hilma Favors or a member of his team this week for recheck of your wrist. Use tylenol every 4 hours. Use clindamycin daily with food. Cellulitis Cellulitis is an infection of the skin and the tissue under the skin. The infected area is usually red and tender. This happens most often in the arms and lower legs. HOME CARE   Take your antibiotic medicine as told. Finish the medicine even if you start to feel better.  Keep the infected arm or leg raised (elevated).  Put a warm cloth on the area up to 4 times per day.  Only take medicines as told by your doctor.  Keep all doctor visits as told. GET HELP IF:  You see red streaks on the skin coming from the infected area.  Your red area gets bigger or turns a dark color.  Your bone or joint under the infected area is painful after the skin heals.  Your infection comes back in the same area or different area.  You have a puffy (swollen) bump in the infected area.  You have new symptoms.  You have a fever. GET HELP RIGHT AWAY IF:   You feel very sleepy.  You throw up (vomit) or have watery poop (diarrhea).  You feel sick and have muscle aches and pains.   This information is not intended to replace advice given to you by your health care provider. Make sure you discuss any questions you have with your health care provider.   Document Released: 06/12/2007 Document Revised: 09/14/2014 Document Reviewed: 03/11/2011 Elsevier Interactive Patient Education [employer].

## 2015-02-13 NOTE — ED Provider Notes (Signed)
CSN: [id number]     Arrival date & time 02/13/15  1225 History  By signing my name below, I, [name], attest that this documentation has been prepared under the direction and in the presence of [name], PA-C Electronically Signed: [name], ED Scribe. 02/13/2015. 2:08 PM.   Chief Complaint  Patient presents with  . Hand Injury      Patient is a 82 y.o. female presenting with hand injury. The history is provided by the patient and a relative. No language interpreter was used.  Hand Injury Location:  Hand Time since incident:  1 day Upper extremity injury: unknown; pt has dementia.   Hand location:  R hand Pain details:    Quality:  Unable to specify   Radiates to:  Does not radiate   Severity:  Mild   Onset quality:  Sudden   Duration:  1 day   Timing:  Constant   Progression:  Unchanged Chronicity:  New Foreign body present:  No foreign bodies Tetanus status:  Unknown Prior injury to area:  Unable to specify Relieved by:  None tried Exacerbated by: palpation. Ineffective treatments:  None tried Associated symptoms: no decreased range of motion and no neck pain     [name] is a 82 y.o. female with a medical hx of dementia and HTN who presents to the Emergency Department complaining of right hand pain with associated swelling onset yesterday. Pt relative notes that the pt didn't have any swelling to her right hand yesterday but she had swelling this morning. Pt relative is unsure if the pt fell or injured her hand. Pt is having associated symptoms of redness x this morning. She notes that she has not tried any medications for the relief of her symptoms. She denies right shoulder pain, CP, neck pain, wound, rash, and any other symptoms. Denies PMHx of gout. Pt takes 81 mg ASA daily.    Past Medical History  Diagnosis Date  . CAD (coronary artery disease)     CABG surgery in 06/2005 for left main disease; normal EF; negative stress nuclear 08/2008  .  Cerebrovascular disease     1993-CVA; 08/2008-mild atherosclerosis without focal stenosis  . Syncope      Negative even recorder dated 3/08  . Tobacco abuse     60 pack years; continuing  . Orthostatic hypotension   . Diverticulosis   . Osteopenia   . GERD (gastroesophageal reflux disease)   . Hypertension   . Mental disorder     mild dementia  . Anemia   . Sinus bradycardia     s/p STJ Assurity dual chamber pacemaker by Dr Rayann Heman 02/2013  . Frequent PVCs   . Family history of anesthesia complication     "makes daughter sick" (02/09/2013)  . High cholesterol   . Asthma   . Chronic bronchitis ([place])     "haven't had it in last 2-3 years; used to get it q yr" (02/09/2013)  . Pneumonia     "couple times; several years ago" (02/09/2013)  . [id number]     "sometimes weekly" (02/09/2013)  . Arthritis     "fingers; back" (02/09/2013)  . Chronic back pain greater than 3 months duration     "nerve damage down into left leg" (02/09/2013)  . Frequent UTI   . Dementia     "more than mild; having more short term memory loss recently; on Aricept" (02/09/2013)   Past Surgical History  Procedure Laterality Date  . Abdominal hysterectomy  1990  . Ventral hernia repair    . Appendectomy    . Tonsillectomy    . Colonoscopy  10/10/2011    Procedure: COLONOSCOPY;  Surgeon: Rogene Houston, MD;  Location: [facility];  Service: Endoscopy;  Laterality: N/A;  1230  . Esophagogastroduodenoscopy  10/10/2011    Procedure: ESOPHAGOGASTRODUODENOSCOPY (EGD);  Surgeon: Rogene Houston, MD;  Location: [facility];  Service: Endoscopy;  Laterality: N/A;  . Pacemaker insertion  02/09/2013    STJ Assurity pacemaker implanted by Dr Rayann Heman for symptomatic bradycardia  . Hernia repair    . Coronary artery bypass graft  2007    Left main disease  . Cardiac catheterization  2007  . Cataract extraction, bilateral    . Permanent pacemaker insertion N/A 02/09/2013    Procedure: PERMANENT PACEMAKER INSERTION;  Surgeon:  Coralyn Mark, MD;  Location: [facility];  Service: Cardiovascular;  Laterality: N/A;   Family History  Problem Relation Age of Onset  . Heart disease    . Arthritis    . Lung disease    . Asthma     Social History  Substance Use Topics  . Smoking status: Former Smoker -- 0.50 packs/day for 60 years    Types: Cigarettes    Start date: 12/12/1952    Quit date: 10/20/2006  . Smokeless tobacco: Never Used     Comment: 02/09/2013 "quit smoking in ~ 2009"  . Alcohol Use: No   OB History    Gravida Para Term Preterm AB TAB SAB Ectopic Multiple Living   3 3 3             Review of Systems  Cardiovascular: Negative for chest pain.  Musculoskeletal: Positive for joint swelling and arthralgias. Negative for neck pain.  Skin: Positive for color change. Negative for rash and wound.  All other systems reviewed and are negative.    Allergies  Cephalexin; Contrast media; Iohexol; Penicillins; Shellfish allergy; and Sulfa antibiotics  Home Medications   Prior to Admission medications   Medication Sig Start Date End Date Taking? Authorizing Provider  acetaminophen (TYLENOL) 325 MG tablet Take 650 mg by mouth every 6 (six) hours as needed for mild pain. For pain   Yes Historical Provider, MD  alendronate (FOSAMAX) 70 MG tablet Take 70 mg by mouth once a week. Take with a full glass of water on an empty stomach.   Yes Historical Provider, MD  aspirin EC 81 MG tablet Take 81 mg by mouth daily.   Yes Historical Provider, MD  atorvastatin (LIPITOR) 10 MG tablet TAKE ONE TABLET ONCE DAILY 10/21/14  Yes Herminio Commons, MD  Calcium-Vitamin D (CALTRATE 600 PLUS-VIT D PO) Take 1 tablet by mouth 2 (two) times daily.    Yes Historical Provider, MD  dimenhyDRINATE (DRAMAMINE) 50 MG tablet Take 50 mg by mouth every 8 (eight) hours as needed for nausea or dizziness.    Yes Historical Provider, MD  donepezil (ARICEPT) 10 MG tablet Take 10 mg by mouth daily.    Yes Historical Provider, MD  isosorbide  mononitrate (IMDUR) 30 MG 24 hr tablet TAKE ONE (1) TABLET BY MOUTH EVERY DAY 02/07/15  Yes Herminio Commons, MD  Multiple Vitamin (MULTIVITAMIN WITH MINERALS) TABS tablet Take 1 tablet by mouth daily.   Yes Historical Provider, MD  nitroGLYCERIN (NITROSTAT) 0.4 MG SL tablet Place 1 tablet (0.4 mg total) under the tongue every 5 (five) minutes as needed for chest pain. 02/03/14  Yes Arnoldo Lenis,  MD  Omega-3 Fatty Acids (FISH OIL) 1200 MG CAPS Take 1,200 mg by mouth 2 (two) times daily.    Yes Historical Provider, MD  omeprazole (PRILOSEC) 20 MG capsule Take 1 capsule (20 mg total) by mouth daily. 10/20/14  Yes Herminio Commons, MD  vitamin B-12 (CYANOCOBALAMIN) 1000 MCG tablet Take 1,000 mcg by mouth daily.    Yes Historical Provider, MD   BP 169/45 mmHg  Pulse 76  Temp(Src) 99.1 F (37.3 C) (Temporal)  Resp 20  Ht 5' 4.5" (1.638 m)  Wt 122 lb (55.339 kg)  BMI 20.63 kg/m2  SpO2 97% Physical Exam  Constitutional: She is oriented to person, place, and time. She appears well-developed and well-nourished. No distress.  HENT:  Head: Normocephalic and atraumatic.  Eyes: EOM are normal.  Neck: Neck supple.  Cardiovascular: Normal rate, regular rhythm and normal heart sounds.  Exam reveals no gallop and no friction rub.   No murmur heard. Pulmonary/Chest: Effort normal and breath sounds normal. No respiratory distress. She has no wheezes. She has no rales.  Abdominal: Soft. There is no tenderness.  Musculoskeletal: Normal range of motion.  Degenerative changes of the hand and wrist. Left wrist is warm to touch with increased redness present.Question red streak present. Good ROM of the right elbow and shoulder.   Neurological: She is alert and oriented to person, place, and time. No sensory deficit.  Skin: Skin is warm and dry.  Psychiatric: She has a normal mood and affect. Her behavior is normal.  Nursing note and vitals reviewed.   ED Course  Pt seen with me by Dr Winfred Leeds.   Procedures (including critical care time) DIAGNOSTIC STUDIES: Oxygen Saturation is 97% on RA, nl by my interpretation.    COORDINATION OF CARE: 2:10 PM Discussed treatment plan with pt family at bedside which includes right hand xray and wrist splint and pt family agreed to plan.    Labs Review Labs Reviewed - No data to display  Imaging Review Dg Hand Complete Right  02/13/2015  CLINICAL DATA:  Pain and swelling.  No history of recent trauma EXAM: RIGHT HAND - COMPLETE 3+ VIEW COMPARISON:  None. FINDINGS: Frontal, oblique, and lateral views were obtained. There is soft tissue swelling dorsally. There is no demonstrable fracture or dislocation. There is osteoarthritic change in all MCP, PIP, and DIP joints, with the greatest degree of osteoarthritic change in the first, second, and fifth DIP joints. There is also moderate osteoarthritic change in the scaphotrapezial and first carpal -metacarpal joints. No erosive change. IMPRESSION: Multilevel osteoarthritic change. No acute fracture or dislocation. No erosive change. There is soft tissue swelling dorsally. Electronically Signed   By: Lowella Grip III M.D.   On: 02/13/2015 13:00   I have personally reviewed and evaluated these images as part of my medical decision-making.   EKG Interpretation None      MDM  Vital signs reviewed. X-ray of the right hand and wrist show multilevel osteoarthritis changes present. There is no fracture or dislocation noted.  There appears to be a red streak noted extending into the forearm area. The patient will be treated with clindamycin, and asked to use Tylenol for soreness. The patient is to follow-up with the primary physician in or return to the emergency department if this cannot be arranged.    Final diagnoses:  Cellulitis of wrist  Primary osteoarthritis of right wrist    **I have reviewed nursing notes, vital signs, and all appropriate lab and imaging results for  this patient.*  **I  personally performed the services described in this documentation, which was scribed in my presence. The recorded information has been reviewed and is accurate.[name], PA-C 02/14/15 2125  Orlie Dakin, MD 02/15/15 [phone]

## 2015-02-13 NOTE — ED Provider Notes (Signed)
Level V caveat dementia patient with reddened swollen right wrist hand and distal forearm first noted today by family. Pain is worse with moving her wrist. On exam patient is reddened and swollen over the distal forearm, proximal hand and over dorsal wrist with prostate 5 cm red streak proximally. Radial pulses 2+. She has no axillary nodes. She has pain on flexion or extension of the wrist. Favor cellulitis over septic joint. X-ray reviewed by me  Orlie Dakin, MD 02/13/15 1447

## 2015-02-16 DIAGNOSIS — Z6821 Body mass index (BMI) 21.0-21.9, adult: Secondary | ICD-10-CM | POA: Diagnosis not present

## 2015-02-16 DIAGNOSIS — L03119 Cellulitis of unspecified part of limb: Secondary | ICD-10-CM | POA: Diagnosis not present

## 2015-02-16 DIAGNOSIS — I10 Essential (primary) hypertension: Secondary | ICD-10-CM | POA: Diagnosis not present

## 2015-02-16 DIAGNOSIS — I4891 Unspecified atrial fibrillation: Secondary | ICD-10-CM | POA: Diagnosis not present

## 2015-02-16 DIAGNOSIS — Z1389 Encounter for screening for other disorder: Secondary | ICD-10-CM | POA: Diagnosis not present

## 2015-02-16 DIAGNOSIS — M1991 Primary osteoarthritis, unspecified site: Secondary | ICD-10-CM | POA: Diagnosis not present

## 2015-02-23 ENCOUNTER — Ambulatory Visit: Payer: Medicare Other | Admitting: Cardiovascular Disease

## 2015-03-01 ENCOUNTER — Ambulatory Visit (INDEPENDENT_AMBULATORY_CARE_PROVIDER_SITE_OTHER): Payer: Medicare Other | Admitting: *Deleted

## 2015-03-01 DIAGNOSIS — I495 Sick sinus syndrome: Secondary | ICD-10-CM

## 2015-03-01 NOTE — Progress Notes (Signed)
Remote pacemaker transmission.   

## 2015-03-07 DIAGNOSIS — H353124 Nonexudative age-related macular degeneration, left eye, advanced atrophic with subfoveal involvement: Secondary | ICD-10-CM | POA: Diagnosis not present

## 2015-03-07 DIAGNOSIS — H353111 Nonexudative age-related macular degeneration, right eye, early dry stage: Secondary | ICD-10-CM | POA: Diagnosis not present

## 2015-03-07 DIAGNOSIS — Z961 Presence of intraocular lens: Secondary | ICD-10-CM | POA: Diagnosis not present

## 2015-03-14 ENCOUNTER — Encounter (HOSPITAL_COMMUNITY)
Admission: RE | Admit: 2015-03-14 | Discharge: 2015-03-14 | Disposition: A | Discharge status: Home / Self Care | Payer: Medicare Other | Source: Ambulatory Visit | Attending: Internal Medicine | Admitting: Internal Medicine

## 2015-03-14 DIAGNOSIS — R079 Chest pain, unspecified: Secondary | ICD-10-CM | POA: Insufficient documentation

## 2015-03-14 DIAGNOSIS — H43813 Vitreous degeneration, bilateral: Secondary | ICD-10-CM | POA: Diagnosis not present

## 2015-03-14 DIAGNOSIS — H353132 Nonexudative age-related macular degeneration, bilateral, intermediate dry stage: Secondary | ICD-10-CM | POA: Diagnosis not present

## 2015-03-14 DIAGNOSIS — H4421 Degenerative myopia, right eye: Secondary | ICD-10-CM | POA: Diagnosis not present

## 2015-03-14 DIAGNOSIS — H353222 Exudative age-related macular degeneration, left eye, with inactive choroidal neovascularization: Secondary | ICD-10-CM | POA: Diagnosis not present

## 2015-03-14 DIAGNOSIS — H35363 Drusen (degenerative) of macula, bilateral: Secondary | ICD-10-CM | POA: Diagnosis not present

## 2015-03-14 NOTE — Progress Notes (Signed)
Patient no-show for 1100 appointment. Contacted patient to reschedule for 03/15/15 at 12:30.

## 2015-03-15 ENCOUNTER — Ambulatory Visit: Payer: Medicare Other | Admitting: Cardiovascular Disease

## 2015-03-15 ENCOUNTER — Encounter (HOSPITAL_COMMUNITY): Admission: RE | Admit: 2015-03-15 | Payer: Medicare Other | Source: Ambulatory Visit

## 2015-03-15 ENCOUNTER — Encounter (HOSPITAL_COMMUNITY)
Admission: RE | Admit: 2015-03-15 | Discharge: 2015-03-15 | Disposition: A | Discharge status: Home / Self Care | Payer: Medicare Other | Source: Ambulatory Visit | Attending: Internal Medicine | Admitting: Internal Medicine

## 2015-03-15 ENCOUNTER — Observation Stay (HOSPITAL_COMMUNITY)
Admission: EM | Admit: 2015-03-15 | Discharge: 2015-03-16 | Disposition: A | Discharge status: Home / Self Care | Payer: Medicare Other | Attending: Internal Medicine | Admitting: Internal Medicine

## 2015-03-15 ENCOUNTER — Emergency Department (HOSPITAL_COMMUNITY): Payer: Medicare Other

## 2015-03-15 ENCOUNTER — Other Ambulatory Visit: Payer: Self-pay

## 2015-03-15 ENCOUNTER — Encounter (HOSPITAL_COMMUNITY): Payer: Self-pay | Admitting: Emergency Medicine

## 2015-03-15 DIAGNOSIS — I495 Sick sinus syndrome: Secondary | ICD-10-CM

## 2015-03-15 DIAGNOSIS — I25118 Atherosclerotic heart disease of native coronary artery with other forms of angina pectoris: Secondary | ICD-10-CM

## 2015-03-15 DIAGNOSIS — E785 Hyperlipidemia, unspecified: Secondary | ICD-10-CM | POA: Diagnosis not present

## 2015-03-15 DIAGNOSIS — I2510 Atherosclerotic heart disease of native coronary artery without angina pectoris: Secondary | ICD-10-CM | POA: Diagnosis not present

## 2015-03-15 DIAGNOSIS — F0390 Unspecified dementia without behavioral disturbance: Secondary | ICD-10-CM | POA: Insufficient documentation

## 2015-03-15 DIAGNOSIS — Z87891 Personal history of nicotine dependence: Secondary | ICD-10-CM | POA: Diagnosis not present

## 2015-03-15 DIAGNOSIS — R0789 Other chest pain: Secondary | ICD-10-CM | POA: Diagnosis present

## 2015-03-15 DIAGNOSIS — R079 Chest pain, unspecified: Principal | ICD-10-CM | POA: Insufficient documentation

## 2015-03-15 DIAGNOSIS — Z950 Presence of cardiac pacemaker: Secondary | ICD-10-CM | POA: Insufficient documentation

## 2015-03-15 DIAGNOSIS — Z79899 Other long term (current) drug therapy: Secondary | ICD-10-CM | POA: Insufficient documentation

## 2015-03-15 DIAGNOSIS — J449 Chronic obstructive pulmonary disease, unspecified: Secondary | ICD-10-CM | POA: Diagnosis not present

## 2015-03-15 DIAGNOSIS — Z7982 Long term (current) use of aspirin: Secondary | ICD-10-CM | POA: Insufficient documentation

## 2015-03-15 DIAGNOSIS — Z8673 Personal history of transient ischemic attack (TIA), and cerebral infarction without residual deficits: Secondary | ICD-10-CM | POA: Insufficient documentation

## 2015-03-15 DIAGNOSIS — Z720 Tobacco use: Secondary | ICD-10-CM

## 2015-03-15 DIAGNOSIS — R072 Precordial pain: Secondary | ICD-10-CM | POA: Diagnosis not present

## 2015-03-15 DIAGNOSIS — K219 Gastro-esophageal reflux disease without esophagitis: Secondary | ICD-10-CM | POA: Diagnosis present

## 2015-03-15 DIAGNOSIS — I10 Essential (primary) hypertension: Secondary | ICD-10-CM | POA: Diagnosis present

## 2015-03-15 DIAGNOSIS — Z951 Presence of aortocoronary bypass graft: Secondary | ICD-10-CM | POA: Insufficient documentation

## 2015-03-15 DIAGNOSIS — E7800 Pure hypercholesterolemia, unspecified: Secondary | ICD-10-CM | POA: Insufficient documentation

## 2015-03-15 DIAGNOSIS — R0602 Shortness of breath: Secondary | ICD-10-CM | POA: Diagnosis not present

## 2015-03-15 HISTORY — DX: Chronic obstructive pulmonary disease, unspecified: J44.9

## 2015-03-15 HISTORY — DX: Dizziness and giddiness: R42

## 2015-03-15 LAB — CBC WITH DIFFERENTIAL/PLATELET
BASOS ABS: 0.1 10*3/uL (ref 0.0–0.1)
BASOS PCT: 1 %
EOS ABS: 0.1 10*3/uL (ref 0.0–0.7)
EOS PCT: 2 %
HCT: 43.8 % (ref 36.0–46.0)
Hemoglobin: 13.7 g/dL (ref 12.0–15.0)
Lymphocytes Relative: 19 %
Lymphs Abs: 1.1 10*3/uL (ref 0.7–4.0)
MCH: 29.5 pg (ref 26.0–34.0)
MCHC: 31.3 g/dL (ref 30.0–36.0)
MCV: 94.4 fL (ref 78.0–100.0)
MONO ABS: 0.6 10*3/uL (ref 0.1–1.0)
Monocytes Relative: 11 %
Neutro Abs: 3.9 10*3/uL (ref 1.7–7.7)
Neutrophils Relative %: 67 %
Platelets: 155 10*3/uL (ref 150–400)
RBC: 4.64 MIL/uL (ref 3.87–5.11)
RDW: 13.8 % (ref 11.5–15.5)
WBC: 5.8 10*3/uL (ref 4.0–10.5)

## 2015-03-15 LAB — COMPREHENSIVE METABOLIC PANEL
ALT: 19 U/L (ref 14–54)
AST: 25 U/L (ref 15–41)
Albumin: 3.9 g/dL (ref 3.5–5.0)
Alkaline Phosphatase: 54 U/L (ref 38–126)
Anion gap: 6 (ref 5–15)
BILIRUBIN TOTAL: 0.7 mg/dL (ref 0.3–1.2)
BUN: 16 mg/dL (ref 6–20)
CO2: 27 mmol/L (ref 22–32)
Calcium: 8.8 mg/dL — ABNORMAL LOW (ref 8.9–10.3)
Chloride: 104 mmol/L (ref 101–111)
Creatinine, Ser: 0.86 mg/dL (ref 0.44–1.00)
GFR calc Af Amer: >60 mL/min (ref >60)
GFR, EST NON AFRICAN AMERICAN: 59 mL/min — AB (ref >60)
Glucose, Bld: 95 mg/dL (ref 65–99)
POTASSIUM: 5.3 mmol/L — AB (ref 3.5–5.1)
Sodium: 137 mmol/L (ref 135–145)
TOTAL PROTEIN: 6.5 g/dL (ref 6.5–8.1)

## 2015-03-15 LAB — BASIC METABOLIC PANEL
Anion gap: 4 — ABNORMAL LOW (ref 5–15)
BUN: 15 mg/dL (ref 6–20)
CALCIUM: 8.8 mg/dL — AB (ref 8.9–10.3)
CO2: 29 mmol/L (ref 22–32)
CREATININE: 0.75 mg/dL (ref 0.44–1.00)
Chloride: 107 mmol/L (ref 101–111)
GFR calc Af Amer: >60 mL/min (ref >60)
GFR calc non Af Amer: >60 mL/min (ref >60)
Glucose, Bld: 83 mg/dL (ref 65–99)
POTASSIUM: 3.9 mmol/L (ref 3.5–5.1)
Sodium: 140 mmol/L (ref 135–145)

## 2015-03-15 LAB — TROPONIN I

## 2015-03-15 MED ORDER — ASPIRIN 81 MG PO CHEW
243.0000 mg | CHEWABLE_TABLET | Freq: Once | ORAL | Status: AC
  Administered 2015-03-15: 243 mg via ORAL
  Filled 2015-03-15: qty 3

## 2015-03-15 MED ORDER — MORPHINE SULFATE (PF) 2 MG/ML IV SOLN: 2.0000 mg | INTRAVENOUS | Status: DC | PRN

## 2015-03-15 MED ORDER — MORPHINE SULFATE (PF) 2 MG/ML IV SOLN
2.0000 mg | INTRAVENOUS | Status: DC | PRN
  Administered 2015-03-15: 2 mg via INTRAVENOUS
  Filled 2015-03-15: qty 1

## 2015-03-15 MED ORDER — DIMENHYDRINATE 50 MG PO TABS
50.0000 mg | ORAL_TABLET | Freq: Three times a day (TID) | ORAL | Status: DC | PRN
  Filled 2015-03-15: qty 1

## 2015-03-15 MED ORDER — PANTOPRAZOLE SODIUM 40 MG PO TBEC
40.0000 mg | DELAYED_RELEASE_TABLET | Freq: Every day | ORAL | Status: DC
  Administered 2015-03-16: 40 mg via ORAL
  Filled 2015-03-15: qty 1

## 2015-03-15 MED ORDER — OCUVITE-LUTEIN PO CAPS
1.0000 | ORAL_CAPSULE | Freq: Two times a day (BID) | ORAL | Status: DC
  Administered 2015-03-15 – 2015-03-16 (×2): 1 via ORAL
  Filled 2015-03-15 (×2): qty 1

## 2015-03-15 MED ORDER — NITROGLYCERIN 0.4 MG SL SUBL: 0.4000 mg | SUBLINGUAL_TABLET | SUBLINGUAL | Status: DC | PRN

## 2015-03-15 MED ORDER — ASPIRIN EC 81 MG PO TBEC
81.0000 mg | DELAYED_RELEASE_TABLET | Freq: Every day | ORAL | Status: DC
  Administered 2015-03-16: 81 mg via ORAL
  Filled 2015-03-15: qty 1

## 2015-03-15 MED ORDER — DONEPEZIL HCL 5 MG PO TABS
10.0000 mg | ORAL_TABLET | Freq: Every day | ORAL | Status: DC
  Administered 2015-03-15: 10 mg via ORAL
  Filled 2015-03-15: qty 2

## 2015-03-15 MED ORDER — ZOLEDRONIC ACID 5 MG/100ML IV SOLN: 5.0000 mg | Freq: Once | INTRAVENOUS | Status: DC

## 2015-03-15 MED ORDER — ZOLPIDEM TARTRATE 5 MG PO TABS
5.0000 mg | ORAL_TABLET | Freq: Every evening | ORAL | Status: DC | PRN
  Administered 2015-03-16: 5 mg via ORAL
  Filled 2015-03-15: qty 1

## 2015-03-15 MED ORDER — GI COCKTAIL ~~LOC~~: 30.0000 mL | Freq: Four times a day (QID) | ORAL | Status: DC | PRN

## 2015-03-15 MED ORDER — ATORVASTATIN CALCIUM 10 MG PO TABS
10.0000 mg | ORAL_TABLET | Freq: Every day | ORAL | Status: DC
  Administered 2015-03-15: 10 mg via ORAL
  Filled 2015-03-15: qty 1

## 2015-03-15 MED ORDER — ISOSORBIDE MONONITRATE ER 60 MG PO TB24
60.0000 mg | ORAL_TABLET | Freq: Every day | ORAL | Status: DC
  Administered 2015-03-16: 60 mg via ORAL
  Filled 2015-03-15: qty 1

## 2015-03-15 MED ORDER — SODIUM CHLORIDE 0.9 % IV SOLN: INTRAVENOUS | Status: DC

## 2015-03-15 MED ORDER — ADULT MULTIVITAMIN W/MINERALS CH
1.0000 | ORAL_TABLET | Freq: Every day | ORAL | Status: DC
  Administered 2015-03-16: 1 via ORAL
  Filled 2015-03-15: qty 1

## 2015-03-15 MED ORDER — ENOXAPARIN SODIUM 40 MG/0.4ML ~~LOC~~ SOLN
40.0000 mg | SUBCUTANEOUS | Status: DC
  Administered 2015-03-15: 40 mg via SUBCUTANEOUS
  Filled 2015-03-15: qty 0.4

## 2015-03-15 MED ORDER — OMEGA-3-ACID ETHYL ESTERS 1 G PO CAPS
1.0000 g | ORAL_CAPSULE | Freq: Two times a day (BID) | ORAL | Status: DC
  Administered 2015-03-15 – 2015-03-16 (×2): 1 g via ORAL
  Filled 2015-03-15 (×2): qty 1

## 2015-03-15 MED ORDER — ACETAMINOPHEN 325 MG PO TABS
650.0000 mg | ORAL_TABLET | ORAL | Status: DC | PRN
Start: 2015-03-15 — End: 2015-03-16

## 2015-03-15 MED ORDER — ONDANSETRON HCL 4 MG/2ML IJ SOLN: 4.0000 mg | Freq: Four times a day (QID) | INTRAMUSCULAR | Status: DC | PRN

## 2015-03-15 NOTE — ED Notes (Signed)
Attempted to call report to [phone], RN unable at this time. Will call back.

## 2015-03-15 NOTE — Progress Notes (Signed)
Patient complained of chest pain on arrival to the clinic. Patient stated that her chest felt real tight. BP stable 129/75 O2 95 % on room air and heart rate 73. Obtained 12 lead EKG and transferred patient to ED.

## 2015-03-15 NOTE — ED Notes (Signed)
Attempted to call report to RN receiving patient assigned to 29 - RN is Camera operator and unable to accept report at this time. Will call back.

## 2015-03-15 NOTE — ED Provider Notes (Signed)
CSN: [id number]     Arrival date & time 03/15/15  0944 History   First MD Initiated Contact with Patient 03/15/15 [phone]     Chief Complaint  Patient presents with  . Chest Pain      Patient is a 82 y.o. female presenting with chest pain. The history is provided by the patient and a relative. The history is limited by the condition of the patient (Hx dementia).  Chest Pain Pt was seen at 1000.  Per pt and her family: Pt c/o mid-sternal chest "pain" that began sometime this morning, per family. Pt's family states pt has hx of chronic chest wall pain, but pt insists to them "this is different." Pt describes the CP as "someone is sitting on her chest." Has been associated with SOB. Pt took ASA 81mg  PO PTA.    Past Medical History  Diagnosis Date  . CAD (coronary artery disease)     CABG surgery in 06/2005 for left main disease; normal EF; negative stress nuclear 08/2008  . Cerebrovascular disease     1993-CVA; 08/2008-mild atherosclerosis without focal stenosis  . Syncope      Negative even recorder dated 3/08  . Tobacco abuse     60 pack years; continuing  . Orthostatic hypotension   . Diverticulosis   . Osteopenia   . GERD (gastroesophageal reflux disease)   . Hypertension   . Mental disorder     mild dementia  . Anemia   . Sinus bradycardia     s/p STJ Assurity dual chamber pacemaker by Dr Rayann Heman 02/2013  . Frequent PVCs   . Family history of anesthesia complication     "makes daughter sick" (02/09/2013)  . High cholesterol   . Asthma   . Chronic bronchitis ([place])     "haven't had it in last 2-3 years; used to get it q yr" (02/09/2013)  . Pneumonia     "couple times; several years ago" (02/09/2013)  . [id number])     "sometimes weekly" (02/09/2013)  . Arthritis     "fingers; back" (02/09/2013)  . Chronic back pain greater than 3 months duration     "nerve damage down into left leg" (02/09/2013)  . Frequent UTI   . Dementia     "more than mild; having more short term memory loss  recently; on Aricept" (02/09/2013)  . Dizziness     chronic   Past Surgical History  Procedure Laterality Date  . Abdominal hysterectomy  1990  . Ventral hernia repair    . Appendectomy    . Tonsillectomy    . Colonoscopy  10/10/2011    Procedure: COLONOSCOPY;  Surgeon: Rogene Houston, MD;  [facility];  Service: Endoscopy;  Laterality: N/A;  1230  . Esophagogastroduodenoscopy  10/10/2011    Procedure: ESOPHAGOGASTRODUODENOSCOPY (EGD);  Surgeon: Rogene Houston, MD;  [facility];  Service: Endoscopy;  Laterality: N/A;  . Pacemaker insertion  02/09/2013    STJ Assurity pacemaker implanted by Dr Rayann Heman for symptomatic bradycardia  . Hernia repair    . Coronary artery bypass graft  2007    Left main disease  . Cardiac catheterization  2007  . Cataract extraction, bilateral    . Permanent pacemaker insertion N/A 02/09/2013    Procedure: PERMANENT PACEMAKER INSERTION;  Surgeon: Coralyn Mark, MD;  Location: [facility];  Service: Cardiovascular;  Laterality: N/A;   Family History  Problem Relation Age of Onset  . Heart disease    .  Arthritis    . Lung disease    . Asthma     Social History  Substance Use Topics  . Smoking status: Former Smoker -- 0.50 packs/day for 60 years    Types: Cigarettes    Start date: 12/12/1952    Quit date: 10/20/2006  . Smokeless tobacco: Never Used     Comment: 02/09/2013 "quit smoking in ~ 2009"  . Alcohol Use: No   OB History    Gravida Para Term Preterm AB TAB SAB Ectopic Multiple Living   3 3 3             Review of Systems  Unable to perform ROS: Dementia  Cardiovascular: Positive for chest pain.      Allergies  Cephalexin; Contrast media; Iohexol; Penicillins; Shellfish allergy; and Sulfa antibiotics  Home Medications   Prior to Admission medications   Medication Sig Start Date End Date Taking? Authorizing Provider  acetaminophen (TYLENOL) 325 MG tablet Take 650 mg by mouth every 6 (six) hours as needed for mild  pain. For pain    Historical Provider, MD  alendronate (FOSAMAX) 70 MG tablet Take 70 mg by mouth once a week. Take with a full glass of water on an empty stomach.    Historical Provider, MD  aspirin EC 81 MG tablet Take 81 mg by mouth daily.    Historical Provider, MD  atorvastatin (LIPITOR) 10 MG tablet TAKE ONE TABLET ONCE DAILY 10/21/14   Herminio Commons, MD  Calcium-Vitamin D (CALTRATE 600 PLUS-VIT D PO) Take 1 tablet by mouth 2 (two) times daily.     Historical Provider, MD  clindamycin (CLEOCIN) 300 MG capsule 1 PO TID with food 02/13/15   [name], PA-C  dimenhyDRINATE (DRAMAMINE) 50 MG tablet Take 50 mg by mouth every 8 (eight) hours as needed for nausea or dizziness.     Historical Provider, MD  donepezil (ARICEPT) 10 MG tablet Take 10 mg by mouth daily.     Historical Provider, MD  isosorbide mononitrate (IMDUR) 30 MG 24 hr tablet TAKE ONE (1) TABLET BY MOUTH EVERY DAY 02/07/15   Herminio Commons, MD  Multiple Vitamin (MULTIVITAMIN WITH MINERALS) TABS tablet Take 1 tablet by mouth daily.    Historical Provider, MD  nitroGLYCERIN (NITROSTAT) 0.4 MG SL tablet Place 1 tablet (0.4 mg total) under the tongue every 5 (five) minutes as needed for chest pain. 02/03/14   Arnoldo Lenis, MD  Omega-3 Fatty Acids (FISH OIL) 1200 MG CAPS Take 1,200 mg by mouth 2 (two) times daily.     Historical Provider, MD  omeprazole (PRILOSEC) 20 MG capsule Take 1 capsule (20 mg total) by mouth daily. 10/20/14   Herminio Commons, MD  vitamin B-12 (CYANOCOBALAMIN) 1000 MCG tablet Take 1,000 mcg by mouth daily.     Historical Provider, MD   BP 137/82 mmHg  Pulse 78  Temp(Src) 98 F (36.7 C) (Oral)  Resp 20  Ht 5\' 4"  (1.626 m)  Wt 129 lb (58.514 kg)  BMI 22.13 kg/m2  SpO2 98% Physical Exam  1005: Physical examination:  Nursing notes reviewed; Vital signs and O2 SAT reviewed;  Constitutional: Well developed, Well nourished, Well hydrated, In no acute distress; Head:  Normocephalic, atraumatic;  Eyes: EOMI, PERRL, No scleral icterus; ENMT: Mouth and pharynx normal, Mucous membranes moist; Neck: Supple, Full range of motion, No lymphadenopathy; Cardiovascular: Regular rate and rhythm, No gallop; Respiratory: Breath sounds clear & equal bilaterally, No wheezes.  Speaking full sentences with ease, Normal  respiratory effort/excursion; Chest: Nontender, Movement normal; Abdomen: Soft, Nontender, Nondistended, Normal bowel sounds; Genitourinary: No CVA tenderness; Extremities: Pulses normal, No tenderness, No edema, No calf edema or asymmetry.; Neuro: AA&Ox3, Major CN grossly intact.  Speech clear. No gross focal motor or sensory deficits in extremities.; Skin: Color normal, Warm, Dry.   ED Course  Procedures (including critical care time) Labs Review   Imaging Review  I have personally reviewed and evaluated these images and lab results as part of my medical decision-making.   EKG Interpretation   Date/Time:  Wednesday March 15 2015 09:49:27 EST Ventricular Rate:  70 PR Interval:  151 QRS Duration: 94 QT Interval:  402 QTC Calculation: 434 R Axis:   80 Text Interpretation:  Atrial-paced complexes Baseline wander When compared  with ECG of 10/14/2013 No significant change was found Confirmed by [facility]   MD, Cassidi Modesitt [phone]) on 03/15/2015 10:13:57 AM      EKG Interpretation  Date/Time:  Wednesday March 15 2015 09:53:05 EST Ventricular Rate:  82 PR Interval:  156 QRS Duration: 102 QT Interval:  404 QTC Calculation: 472 R Axis:   80 Text Interpretation:  Atrial-paced complexes Ventricular premature complex Nonspecific repol abnormality, lateral leads Premature ventricular complexes and Nonspecific ST and T wave abnormality Lateral leads is now Present Since last tracing of earlier today Confirmed by [facility]  MD, Nunzio Cory [phone]) on 03/15/2015 10:15:25 AM        MDM  MDM Reviewed: previous chart, nursing note and vitals Reviewed previous: labs and ECG Interpretation: labs,  ECG and x-ray      Results for orders placed or performed during the hospital encounter of [id number]  Basic metabolic panel  Result Value Ref Range   Sodium 140 135 - 145 mmol/L   Potassium 3.9 3.5 - 5.1 mmol/L   Chloride 107 101 - 111 mmol/L   CO2 29 22 - 32 mmol/L   Glucose, Bld 83 65 - 99 mg/dL   BUN 15 6 - 20 mg/dL   Creatinine, Ser 0.75 0.44 - 1.00 mg/dL   Calcium 8.8 (L) 8.9 - 10.3 mg/dL   GFR calc non Af Amer >60 >60 mL/min   GFR calc Af Amer >60 >60 mL/min   Anion gap 4 (L) 5 - 15  Troponin I  Result Value Ref Range   Troponin I <0.03 <0.031 ng/mL  CBC with Differential  Result Value Ref Range   WBC 5.8 4.0 - 10.5 K/uL   RBC 4.64 3.87 - 5.11 MIL/uL   Hemoglobin 13.7 12.0 - 15.0 g/dL   HCT 43.8 36.0 - 46.0 %   MCV 94.4 78.0 - 100.0 fL   MCH 29.5 26.0 - 34.0 pg   MCHC 31.3 30.0 - 36.0 g/dL   RDW 13.8 11.5 - 15.5 %   Platelets 155 150 - 400 K/uL   Neutrophils Relative % 67 %   Neutro Abs 3.9 1.7 - 7.7 K/uL   Lymphocytes Relative 19 %   Lymphs Abs 1.1 0.7 - 4.0 K/uL   Monocytes Relative 11 %   Monocytes Absolute 0.6 0.1 - 1.0 K/uL   Eosinophils Relative 2 %   Eosinophils Absolute 0.1 0.0 - 0.7 K/uL   Basophils Relative 1 %   Basophils Absolute 0.1 0.0 - 0.1 K/uL   Dg Chest Port 1 View 03/15/2015  CLINICAL DATA:  Chest pain last night with shortness of breath. Prior smoker. EXAM: PORTABLE CHEST 1 VIEW COMPARISON:  10/14/2013 FINDINGS: The lungs are hyperinflated likely secondary to COPD. There is  no focal parenchymal opacity. There is no pleural effusion or pneumothorax. The heart size is mildly enlarged. There is evidence of prior CABG. There is a dual lead AICD. The osseous structures are unremarkable. IMPRESSION: No active disease. Electronically Signed   By: Kathreen Devoid   On: 03/15/2015 10:35    1150:  ASA, SL ntg and IV morphine given with improvement. Dx and testing d/w pt and family.  Questions answered.  Verb understanding, agreeable to admit. T/C to [facility].  Jerilee Hoh, case discussed, including:  HPI, pertinent PM/SHx, VS/PE, dx testing, ED course and treatment:  Agreeable to admit, requests to write temporary orders, obtain observation tele bed to team [facility].     Francine Graven, DO 03/20/15 [phone]

## 2015-03-15 NOTE — ED Notes (Signed)
Pt assisted with ambulation to bathroom for urination. Attempted to call report to RN receiving pt at transfer to [phone], RN remains unable to take report. ED Charge RN made aware.

## 2015-03-15 NOTE — H&P (Signed)
[facility]          History and Physical    PCP:   Purvis Kilts, MD   EDP: Francine Graven, D.O.  Chief Complaint:  Chest pain  HPI: Patient is an 82 year old woman with history of coronary artery disease status post CABG, chronic dizziness, most recent nuclear stress test in November 2013 that showed no evidence for ischemia, recurrent, episodic chest pain on indoor. She came into the hospital to short stay this morning for an infusion of a bisphosphonate while there she complained of chest pain describing that somebody was sitting on her chest and she was sent to the ED for evaluation. In the ED her vital signs were within normal limits, initial troponin was negative, EKG was paced and hence unable to interpret for any acute changes. We were asked to admit her for observation and further management.  Allergies:   Allergies  Allergen Reactions  . Cephalexin Hives  . Contrast Media [Iodinated Diagnostic Agents]     Unknown  . Iohexol     Urticaria, emesis, numbness during IVP performed at [facility] years ago  . Penicillins Hives    unknown  . Shellfish Allergy Other (See Comments)    Unknown  . Sulfa Antibiotics Hives      Past Medical History  Diagnosis Date  . CAD (coronary artery disease)     CABG surgery in 06/2005 for left main disease; normal EF; negative stress nuclear 08/2008  . Cerebrovascular disease     1993-CVA; 08/2008-mild atherosclerosis without focal stenosis  . Syncope      Negative even recorder dated 3/08  . Tobacco abuse     60 pack years; continuing  . Orthostatic hypotension   . Diverticulosis   . Osteopenia   . GERD (gastroesophageal reflux disease)   . Hypertension   . Mental disorder     mild dementia  . Anemia   . Sinus bradycardia     s/p STJ Assurity dual chamber pacemaker by Dr Rayann Heman 02/2013  . Frequent PVCs   . Family history of anesthesia complication     "makes daughter sick" (02/09/2013)  .  High cholesterol   . Asthma   . Chronic bronchitis ([place])     "haven't had it in last 2-3 years; used to get it q yr" (02/09/2013)  . Pneumonia     "couple times; several years ago" (02/09/2013)  . OIZTIWPY(099.8)     "sometimes weekly" (02/09/2013)  . Arthritis     "fingers; back" (02/09/2013)  . Chronic back pain greater than 3 months duration     "nerve damage down into left leg" (02/09/2013)  . Frequent UTI   . Dementia     "more than mild; having more short term memory loss recently; on Aricept" (02/09/2013)  . Dizziness     chronic  . COPD (chronic obstructive pulmonary disease) ([place])   . Stroke [facility])     20-30 yrs ago    Past Surgical History  Procedure Laterality Date  . Abdominal hysterectomy  1990  . Ventral hernia repair    . Appendectomy    . Tonsillectomy    . Colonoscopy  10/10/2011    Procedure: COLONOSCOPY;  Surgeon: Rogene Houston, MD;  [facility];  Service: Endoscopy;  Laterality: N/A;  1230  . Esophagogastroduodenoscopy  10/10/2011    Procedure: ESOPHAGOGASTRODUODENOSCOPY (EGD);  Surgeon: Rogene Houston, MD;  [facility];  Service: Endoscopy;  Laterality: N/A;  . Pacemaker insertion  02/09/2013    STJ Assurity pacemaker implanted by Dr Rayann Heman for symptomatic bradycardia  . Hernia repair    . Coronary artery bypass graft  2007    Left main disease  . Cardiac catheterization  2007  . Cataract extraction, bilateral    . Permanent pacemaker insertion N/A 02/09/2013    Procedure: PERMANENT PACEMAKER INSERTION;  Surgeon: Coralyn Mark, MD;  Location: [facility]ATH [facility];  Service: Cardiovascular;  Laterality: N/A;    Prior to Admission medications   Medication Sig Start Date End Date Taking? Authorizing Provider  acetaminophen (TYLENOL) 325 MG tablet Take 650 mg by mouth every 6 (six) hours as needed for mild pain. For pain   Yes Historical Provider, MD  aspirin EC 81 MG tablet Take 81 mg by mouth daily.   Yes Historical Provider, MD  atorvastatin  (LIPITOR) 10 MG tablet TAKE ONE TABLET ONCE DAILY 10/21/14  Yes Herminio Commons, MD  calcium citrate (CALCITRATE - DOSED IN MG ELEMENTAL CALCIUM) 950 MG tablet Take 200 mg of elemental calcium by mouth at bedtime.   Yes Historical Provider, MD  Calcium-Vitamin D (CALTRATE 600 PLUS-VIT D PO) Take 1 tablet by mouth at bedtime.    Yes Historical Provider, MD  dimenhyDRINATE (DRAMAMINE) 50 MG tablet Take 50 mg by mouth every 8 (eight) hours as needed for nausea or dizziness.    Yes Historical Provider, MD  donepezil (ARICEPT) 10 MG tablet Take 10 mg by mouth daily.    Yes Historical Provider, MD  isosorbide mononitrate (IMDUR) 30 MG 24 hr tablet TAKE ONE (1) TABLET BY MOUTH EVERY DAY 02/07/15  Yes Herminio Commons, MD  Multiple Vitamin (MULTIVITAMIN WITH MINERALS) TABS tablet Take 1 tablet by mouth daily.   Yes Historical Provider, MD  Multiple Vitamins-Minerals (PRESERVISION AREDS 2 PO) Take 1 capsule by mouth 2 (two) times daily.   Yes Historical Provider, MD  nitroGLYCERIN (NITROSTAT) 0.4 MG SL tablet Place 1 tablet (0.4 mg total) under the tongue every 5 (five) minutes as needed for chest pain. 02/03/14  Yes Arnoldo Lenis, MD  Omega-3 Fatty Acids (FISH OIL) 1200 MG CAPS Take 1,200 mg by mouth 2 (two) times daily.    Yes Historical Provider, MD  omeprazole (PRILOSEC) 20 MG capsule Take 1 capsule (20 mg total) by mouth daily. 10/20/14  Yes Herminio Commons, MD  PRESCRIPTION MEDICATION Inhale 2 puffs into the lungs daily as needed (anxiety and sob).   Yes Historical Provider, MD  vitamin B-12 (CYANOCOBALAMIN) 1000 MCG tablet Take 1,000 mcg by mouth daily.    Yes Historical Provider, MD  alendronate (FOSAMAX) 70 MG tablet Take 70 mg by mouth every Sunday. Take with a full glass of water on an empty stomach.    Historical Provider, MD  clindamycin (CLEOCIN) 300 MG capsule 1 PO TID with food Patient not taking: Reported on 03/15/2015 02/13/15   [name], PA-C    Social History:  reports  that she quit smoking about 8 years ago. Her smoking use included Cigarettes. She started smoking about 62 years ago. She has a 30 pack-year smoking history. She has never used smokeless tobacco. She reports that she does not drink alcohol or use illicit drugs.  Family History  Problem Relation Age of Onset  . Heart disease    . Arthritis    . Lung disease    . Asthma  Review of Systems:  Constitutional: Denies fever, chills, diaphoresis, appetite change and fatigue.  HEENT: Denies photophobia, eye pain, redness, hearing loss, ear pain, congestion, sore throat, rhinorrhea, sneezing, mouth sores, trouble swallowing, neck pain, neck stiffness and tinnitus.   Respiratory: Denies, cough, chest tightness,  and wheezing.   Cardiovascular: Denies  palpitations and leg swelling.  Gastrointestinal: Denies nausea, vomiting, abdominal pain, diarrhea, constipation, blood in stool and abdominal distention.  Genitourinary: Denies dysuria, urgency, frequency, hematuria, flank pain and difficulty urinating.  Endocrine: Denies: hot or cold intolerance, sweats, changes in hair or nails, polyuria, polydipsia. Musculoskeletal: Denies myalgias, back pain, joint swelling, arthralgias and gait problem.  Skin: Denies pallor, rash and wound.  Neurological: Denies seizures, syncope, weakness, light-headedness, numbness and headaches.  Hematological: Denies adenopathy. Easy bruising, personal or family bleeding history  Psychiatric/Behavioral: Denies suicidal ideation, mood changes, confusion, nervousness, sleep disturbance and agitation   Physical Exam: Blood pressure 131/76, pulse 61, temperature 98.3 F (36.8 C), temperature source Oral, resp. rate 24, height _0  (1.626 m), weight 58.9 kg (129 lb 13.6 oz), SpO2 98 %. General: Alert, awake, oriented 3 HEENT: Normocephalic, atraumatic, pupils equal and reactive, wears corrective lenses, moist mucous membranes Neck: Supple, no JVD, no lymphadenopathy, no  bruits, no goiter Cardiovascular: Regular rate and rhythm, no murmurs, rubs or gallops Lungs: Clear to auscultation bilaterally Abdomen: Soft, nontender, nondistended, positive bowel sounds Extremities: No clubbing, cyanosis or edema, positive pulses Neurologic: Grossly intact and nonfocal  Labs on Admission:  Results for orders placed or performed during the hospital encounter of 03/15/15 (from the past 48 hour(s))  Basic metabolic panel     Status: Abnormal   Collection Time: 03/15/15 10:45 AM  Result Value Ref Range   Sodium 140 135 - 145 mmol/L   Potassium 3.9 3.5 - 5.1 mmol/L   Chloride 107 101 - 111 mmol/L   CO2 29 22 - 32 mmol/L   Glucose, Bld 83 65 - 99 mg/dL   BUN 15 6 - 20 mg/dL   Creatinine, Ser 0.75 0.44 - 1.00 mg/dL   Calcium 8.8 (L) 8.9 - 10.3 mg/dL   GFR calc non Af Amer >60 >60 mL/min   GFR calc Af Amer >60 >60 mL/min    Comment: (NOTE) The eGFR has been calculated using the CKD EPI equation. This calculation has not been validated in all clinical situations. eGFR's persistently <60 mL/min signify possible Chronic Kidney Disease.    Anion gap 4 (L) 5 - 15  Troponin I     Status: None   Collection Time: 03/15/15 10:45 AM  Result Value Ref Range   Troponin I <0.03 <0.031 ng/mL    Comment:        NO INDICATION OF MYOCARDIAL INJURY.   CBC with Differential     Status: None   Collection Time: 03/15/15 10:45 AM  Result Value Ref Range   WBC 5.8 4.0 - 10.5 K/uL   RBC 4.64 3.87 - 5.11 MIL/uL   Hemoglobin 13.7 12.0 - 15.0 g/dL   HCT 43.8 36.0 - 46.0 %   MCV 94.4 78.0 - 100.0 fL   MCH 29.5 26.0 - 34.0 pg   MCHC 31.3 30.0 - 36.0 g/dL   RDW 13.8 11.5 - 15.5 %   Platelets 155 150 - 400 K/uL   Neutrophils Relative % 67 %   Neutro Abs 3.9 1.7 - 7.7 K/uL   Lymphocytes Relative 19 %   Lymphs Abs 1.1 0.7 - 4.0 K/uL   Monocytes Relative 11 %  Monocytes Absolute 0.6 0.1 - 1.0 K/uL   Eosinophils Relative 2 %   Eosinophils Absolute 0.1 0.0 - 0.7 K/uL   Basophils  Relative 1 %   Basophils Absolute 0.1 0.0 - 0.1 K/uL    Radiological Exams on Admission: Dg Chest Port 1 View  03/15/2015  CLINICAL DATA:  Chest pain last night with shortness of breath. Prior smoker. EXAM: PORTABLE CHEST 1 VIEW COMPARISON:  10/14/2013 FINDINGS: The lungs are hyperinflated likely secondary to COPD. There is no focal parenchymal opacity. There is no pleural effusion or pneumothorax. The heart size is mildly enlarged. There is evidence of prior CABG. There is a dual lead AICD. The osseous structures are unremarkable. IMPRESSION: No active disease. Electronically Signed   By: Kathreen Devoid   On: 03/15/2015 10:35    Assessment/Plan Principal Problem:   Chest pain Active Problems:   Hyperlipidemia   Tobacco abuse   GERD (gastroesophageal reflux disease)   Sick sinus syndrome (HCC)   CAD (coronary artery disease)   Essential hypertension    Chest pain -Has currently resolved. -I have reviewed cardiology's last note in detail and it appears that medical management is to be pursued, she had a negative stress test in November 2013. -Given heart score of 5 will admit to telemetry for observation, will cycle troponins, will increase Imdur from 30-60 mg daily (her blood pressure will tolerate this). -If troponins rise may request cardiology evaluation, otherwise she can be discharged and follow-up with them in the outpatient setting.  GERD -Continue PPI  Hypertension -We'll control, continue current medications  Sick sinus syndrome -Has a permanent pacemaker  Hyperlipidemia -Continue statin  CODE STATUS -Full code  DVT prophylaxis -Subcutaneous lovenox   Time Spent on Admission: 75 minutes  HERNANDEZ ACOSTA,ESTELA [facility] Pager: [phone] 03/15/2015, 6:38 PM

## 2015-03-15 NOTE — ED Notes (Signed)
Pt was in short stay for infusion of Reclast and informed the nurse she had chest pain.  Pt states it started last night with shortness of breath.  Daughter reports pt has chronic sob and chest pain, but pt states this feels different.  States it feels like someone is sitting on her chest and cannot get a deep breath.

## 2015-03-16 DIAGNOSIS — I2510 Atherosclerotic heart disease of native coronary artery without angina pectoris: Secondary | ICD-10-CM | POA: Diagnosis not present

## 2015-03-16 DIAGNOSIS — R079 Chest pain, unspecified: Secondary | ICD-10-CM | POA: Diagnosis not present

## 2015-03-16 DIAGNOSIS — E785 Hyperlipidemia, unspecified: Secondary | ICD-10-CM | POA: Diagnosis not present

## 2015-03-16 LAB — TROPONIN I

## 2015-03-16 MED ORDER — ISOSORBIDE MONONITRATE ER 60 MG PO TB24: 60.0000 mg | ORAL_TABLET | Freq: Every day | ORAL | Status: DC

## 2015-03-16 NOTE — Care Management Note (Signed)
Case Management Note  Patient Details  Name: RENETA QIAO MRN: [id number] Date of Birth: October 29, 1925  Subjective/Objective:                  Pt admitted with CP. Pt is from home with husband. Pt is ind with ADL's. Pt has cane to use PRN. Pt plans to return home with self care today.   Action/Plan: No CM needs.   Expected Discharge Date:    03/16/2015              Expected Discharge Plan:  Home/Self Care  In-House Referral:  NA  Discharge planning Services  CM Consult  Post Acute Care Choice:  NA Choice offered to:  NA  DME Arranged:    DME Agency:     HH Arranged:    HH Agency:     Status of Service:  Completed, signed off  Medicare Important Message Given:    Date Medicare IM Given:    Medicare IM give by:    Date Additional Medicare IM Given:    Additional Medicare Important Message give by:     If discussed at [place] of Stay Meetings, dates discussed:    Additional Comments:  Sherald Barge, RN 03/16/2015, 2:09 PM

## 2015-03-16 NOTE — Care Management Obs Status (Signed)
[place] NOTIFICATION   Patient Details  Name: [name] MRN: [id number] Date of Birth: August 03, 1925   Medicare Observation Status Notification Given:  Yes    Sherald Barge, RN 03/16/2015, 2:08 PM

## 2015-03-16 NOTE — Discharge Summary (Signed)
Physician Discharge Summary  [name] [id number] DOB: Jan 21, 1925 DOA: 03/15/2015  PCP: Purvis Kilts, MD  Admit date: 03/15/2015 Discharge date: 03/16/2015  Time spent: 45 minutes  Recommendations for Outpatient Follow-up:  -We'll be discharged home today. -Advised to follow-up with her cardiologist in 2 weeks.   Discharge Diagnoses:  Principal Problem:   Chest pain Active Problems:   Hyperlipidemia   Tobacco abuse   GERD (gastroesophageal reflux disease)   Sick sinus syndrome (HCC)   CAD (coronary artery disease)   Essential hypertension   Discharge Condition: Stable and improved  Filed Weights   03/15/15 0948 03/15/15 1542  Weight: 58.514 kg (129 lb) 58.9 kg (129 lb 13.6 oz)    History of present illness:  Patient is an 82 year old woman with history of coronary artery disease status post CABG, chronic dizziness, most recent nuclear stress test in November 2013 that showed no evidence for ischemia, recurrent, episodic chest pain on indoor. She came into the hospital to short stay this morning for an infusion of a bisphosphonate while there she complained of chest pain describing that somebody was sitting on her chest and she was sent to the ED for evaluation. In the ED her vital signs were within normal limits, initial troponin was negative, EKG was paced and hence unable to interpret for any acute changes. We were asked to admit her for observation and further management.   Hospital Course:   Chest pain -Resolved, ruled out for acute coronary syndrome by negative troponins and EKG that did not demonstrate any acute ischemic abnormalities. -Had a negative stress test in November 2013. -Have increased her dose of imdur from 30-60 mg. -Advised to follow-up with her cardiologist in 2 weeks.  GERD -Continue PPI  Hypertension -Well-controlled, continue home medications.  Hyperlipidemia -Continue statin  Sick sinus syndrome -Status post permanent  pacemaker   Procedures:  None   Consultations:  None  Discharge Instructions  Discharge Instructions    Diet - low sodium heart healthy    Complete by:  As directed      Increase activity slowly    Complete by:  As directed             Medication List    STOP taking these medications        PRESCRIPTION MEDICATION      TAKE these medications        acetaminophen 325 MG tablet  Commonly known as:  TYLENOL  Take 650 mg by mouth every 6 (six) hours as needed for mild pain. For pain     alendronate 70 MG tablet  Commonly known as:  FOSAMAX  Take 70 mg by mouth every Sunday. Take with a full glass of water on an empty stomach.     aspirin EC 81 MG tablet  Take 81 mg by mouth daily.     atorvastatin 10 MG tablet  Commonly known as:  LIPITOR  TAKE ONE TABLET ONCE DAILY     calcium citrate 950 MG tablet  Commonly known as:  CALCITRATE - dosed in mg elemental calcium  Take 200 mg of elemental calcium by mouth at bedtime.     CALTRATE 600 PLUS-VIT D PO  Take 1 tablet by mouth at bedtime.     dimenhyDRINATE 50 MG tablet  Commonly known as:  DRAMAMINE  Take 50 mg by mouth every 8 (eight) hours as needed for nausea or dizziness.     donepezil 10 MG tablet  Commonly known  as:  ARICEPT  Take 10 mg by mouth daily.     Fish Oil 1200 MG Caps  Take 1,200 mg by mouth 2 (two) times daily.     isosorbide mononitrate 60 MG 24 hr tablet  Commonly known as:  IMDUR  Take 1 tablet (60 mg total) by mouth daily.     multivitamin with minerals Tabs tablet  Take 1 tablet by mouth daily.     nitroGLYCERIN 0.4 MG SL tablet  Commonly known as:  NITROSTAT  Place 1 tablet (0.4 mg total) under the tongue every 5 (five) minutes as needed for chest pain.     omeprazole 20 MG capsule  Commonly known as:  PRILOSEC  Take 1 capsule (20 mg total) by mouth daily.     PRESERVISION AREDS 2 PO  Take 1 capsule by mouth 2 (two) times daily.     vitamin B-12 1000 MCG tablet  Commonly  known as:  CYANOCOBALAMIN  Take 1,000 mcg by mouth daily.       Allergies  Allergen Reactions  . Cephalexin Hives  . Contrast Media [Iodinated Diagnostic Agents]     Unknown  . Iohexol     Urticaria, emesis, numbness during IVP performed at [facility] years ago  . Penicillins Hives    unknown  . Shellfish Allergy Other (See Comments)    Unknown  . Sulfa Antibiotics Hives       Follow-up Information    Follow up with Kate Sable A, MD In 2 weeks.   Specialty:  Cardiology   Contact information:   [address] [address] [phone]        The results of significant diagnostics from this hospitalization (including imaging, microbiology, ancillary and laboratory) are listed below for reference.    Significant Diagnostic Studies: Dg Chest Port 1 View  03/15/2015  CLINICAL DATA:  Chest pain last night with shortness of breath. Prior smoker. EXAM: PORTABLE CHEST 1 VIEW COMPARISON:  10/14/2013 FINDINGS: The lungs are hyperinflated likely secondary to COPD. There is no focal parenchymal opacity. There is no pleural effusion or pneumothorax. The heart size is mildly enlarged. There is evidence of prior CABG. There is a dual lead AICD. The osseous structures are unremarkable. IMPRESSION: No active disease. Electronically Signed   By: Kathreen Devoid   On: 03/15/2015 10:35    Microbiology: No results found for this or any previous visit (from the past 240 hour(s)).   Labs: Basic Metabolic Panel:  Recent Labs Lab 03/15/15 1045 03/15/15 1936  NA 140 137  K 3.9 5.3*  CL 107 104  CO2 29 27  GLUCOSE 83 95  BUN 15 16  CREATININE 0.75 0.86  CALCIUM 8.8* 8.8*   Liver Function Tests:  Recent Labs Lab 03/15/15 1936  AST 25  ALT 19  ALKPHOS 54  BILITOT 0.7  PROT 6.5  ALBUMIN 3.9   No results for input(s): LIPASE, AMYLASE in the last 168 hours. No results for input(s): AMMONIA in the last 168 hours. CBC:  Recent Labs Lab 03/15/15 1045  WBC  5.8  NEUTROABS 3.9  HGB 13.7  HCT 43.8  MCV 94.4  PLT 155   Cardiac Enzymes:  Recent Labs Lab 03/15/15 1045 03/15/15 1936 03/15/15 2251 03/16/15 0129  TROPONINI <0.03 <0.03 <0.03 <0.03   BNP: BNP (last 3 results) No results for input(s): BNP in the last 8760 hours.  ProBNP (last 3 results) No results for input(s): PROBNP in the last 8760 hours.  CBG: No results for input(s): GLUCAP in the last 168 hours.     SignedLelon Frohlich  [facility] Pager: [phone] 03/16/2015, 11:58 AM

## 2015-03-16 NOTE — Progress Notes (Signed)
Patient with orders to be discharge home. Discharge instructions given, family verbalized understanding. Patient stable. Patient left in private vehicle with family.

## 2015-03-22 DIAGNOSIS — Z1389 Encounter for screening for other disorder: Secondary | ICD-10-CM | POA: Diagnosis not present

## 2015-03-22 DIAGNOSIS — I25119 Atherosclerotic heart disease of native coronary artery with unspecified angina pectoris: Secondary | ICD-10-CM | POA: Diagnosis not present

## 2015-03-22 DIAGNOSIS — I209 Angina pectoris, unspecified: Secondary | ICD-10-CM | POA: Diagnosis not present

## 2015-03-22 DIAGNOSIS — E538 Deficiency of other specified B group vitamins: Secondary | ICD-10-CM | POA: Diagnosis not present

## 2015-03-22 DIAGNOSIS — Z23 Encounter for immunization: Secondary | ICD-10-CM | POA: Diagnosis not present

## 2015-03-22 DIAGNOSIS — Z6821 Body mass index (BMI) 21.0-21.9, adult: Secondary | ICD-10-CM | POA: Diagnosis not present

## 2015-03-28 ENCOUNTER — Encounter (HOSPITAL_COMMUNITY)
Admission: RE | Admit: 2015-03-28 | Discharge: 2015-03-28 | Disposition: A | Discharge status: Home / Self Care | Payer: Medicare Other | Source: Ambulatory Visit | Attending: Internal Medicine | Admitting: Internal Medicine

## 2015-03-28 DIAGNOSIS — M810 Age-related osteoporosis without current pathological fracture: Secondary | ICD-10-CM | POA: Insufficient documentation

## 2015-03-28 LAB — CUP PACEART REMOTE DEVICE CHECK
Battery Remaining Longevity: 122 mo
Battery Remaining Percentage: 95.5 %
Brady Statistic AP VS Percent: 51 %
Brady Statistic AS VS Percent: 40 %
Brady Statistic RA Percent Paced: 38 %
Brady Statistic RV Percent Paced: 1 %
Date Time Interrogation Session: 20170222070014
Implantable Lead Implant Date: 20150203
Implantable Lead Location: 753860
Implantable Lead Model: 1948
Lead Channel Impedance Value: 440 Ohm
Lead Channel Impedance Value: 640 Ohm
Lead Channel Sensing Intrinsic Amplitude: 12 mV
Lead Channel Setting Pacing Amplitude: 2 V
Lead Channel Setting Pacing Pulse Width: 0.4 ms
MDC IDC LEAD IMPLANT DT: 20150203
MDC IDC LEAD LOCATION: 753859
MDC IDC MSMT BATTERY VOLTAGE: 3.01 V
MDC IDC MSMT LEADCHNL RA SENSING INTR AMPL: 2.5 mV
MDC IDC SET LEADCHNL RV PACING AMPLITUDE: 2.5 V
MDC IDC SET LEADCHNL RV SENSING SENSITIVITY: 2 mV
MDC IDC STAT BRADY AP VP PERCENT: 1 %
MDC IDC STAT BRADY AS VP PERCENT: 1 %
Pulse Gen Model: 2240
Pulse Gen Serial Number: 7586568

## 2015-03-28 MED ORDER — ZOLEDRONIC ACID 5 MG/100ML IV SOLN
5.0000 mg | Freq: Once | INTRAVENOUS | Status: AC
  Administered 2015-03-28: 5 mg via INTRAVENOUS
  Filled 2015-03-28: qty 100

## 2015-03-28 MED ORDER — SODIUM CHLORIDE 0.9 % IV SOLN: Freq: Once | INTRAVENOUS | Status: DC

## 2015-03-28 NOTE — Progress Notes (Signed)
Patient received Reclast 5 mg IV over 30 minutes in short stay clinic. No untoward effects noted.

## 2015-03-29 ENCOUNTER — Encounter: Payer: Self-pay | Admitting: Cardiology

## 2015-03-30 ENCOUNTER — Encounter: Payer: Self-pay | Admitting: Adult Health

## 2015-03-30 ENCOUNTER — Ambulatory Visit (INDEPENDENT_AMBULATORY_CARE_PROVIDER_SITE_OTHER): Payer: Medicare Other | Admitting: Adult Health

## 2015-03-30 VITALS — BP 102/64 | HR 70 | Ht 63.0 in | Wt 126.0 lb

## 2015-03-30 DIAGNOSIS — I2510 Atherosclerotic heart disease of native coronary artery without angina pectoris: Secondary | ICD-10-CM | POA: Diagnosis not present

## 2015-03-30 DIAGNOSIS — Z8719 Personal history of other diseases of the digestive system: Secondary | ICD-10-CM | POA: Diagnosis not present

## 2015-03-30 NOTE — Progress Notes (Deleted)
Name: [name]    DOB: 1925/05/03  Age: 82 y.o.  MR#: [id number]       PCP:  Purvis Kilts, MD      Insurance: Payor: [employer] MEDICARE / Plan: [facility] MEDICARE / Product Type: *No Product type* /   CC:   No chief complaint on file.   VS Filed Vitals:   03/30/15 1406  BP: 102/64  Pulse: 70  Height: 5\' 3"  (1.6 m)  Weight: 126 lb (57.153 kg)  SpO2: 98%    Weights Current Weight  03/30/15 126 lb (57.153 kg)  03/28/15 126 lb (57.153 kg)  03/15/15 129 lb 13.6 oz (58.9 kg)    Blood Pressure  BP Readings from Last 3 Encounters:  03/30/15 102/64  03/28/15 117/55  03/16/15 148/81     Admit date:  (Not on file) Last encounter with RMR:  Visit date not found   Allergy Cephalexin; Contrast media; Iohexol; Penicillins; Shellfish allergy; and Sulfa antibiotics  Current Outpatient Prescriptions  Medication Sig Dispense Refill  . acetaminophen (TYLENOL) 325 MG tablet Take 650 mg by mouth every 6 (six) hours as needed for mild pain. For pain    . aspirin EC 81 MG tablet Take 81 mg by mouth daily.    Marland Kitchen atorvastatin (LIPITOR) 10 MG tablet TAKE ONE TABLET ONCE DAILY 30 tablet 11  . calcium citrate (CALCITRATE - DOSED IN MG ELEMENTAL CALCIUM) 950 MG tablet Take 200 mg of elemental calcium by mouth at bedtime.    . Calcium-Vitamin D (CALTRATE 600 PLUS-VIT D PO) Take 1 tablet by mouth at bedtime.     . donepezil (ARICEPT) 10 MG tablet Take 10 mg by mouth daily.     . isosorbide mononitrate (IMDUR) 30 MG 24 hr tablet     . Multiple Vitamin (MULTIVITAMIN WITH MINERALS) TABS tablet Take 1 tablet by mouth daily.    . nitroGLYCERIN (NITROSTAT) 0.4 MG SL tablet Place 1 tablet (0.4 mg total) under the tongue every 5 (five) minutes as needed for chest pain. 25 tablet 3  . Omega-3 Fatty Acids (FISH OIL) 1200 MG CAPS Take 1,200 mg by mouth 2 (two) times daily.     Marland Kitchen omeprazole (PRILOSEC) 20 MG capsule Take 1 capsule (20 mg total) by mouth daily. 90 capsule 3  . vitamin B-12  (CYANOCOBALAMIN) 1000 MCG tablet Take 1,000 mcg by mouth daily.      No current facility-administered medications for this visit.    Discontinued Meds:    Medications Discontinued During This Encounter  Medication Reason  . Multiple Vitamins-Minerals (PRESERVISION AREDS 2 PO) Error  . dimenhyDRINATE (DRAMAMINE) 50 MG tablet Error  . alendronate (FOSAMAX) 70 MG tablet Error  . isosorbide mononitrate (IMDUR) 60 MG 24 hr tablet Error    Patient Active Problem List   Diagnosis Date Noted  . Pacemaker 05/27/2014  . Balance disorder 10/05/2013  . Dizziness of unknown cause 10/05/2013  . Difficulty in walking(719.7) 10/05/2013  . Muscle weakness (generalized) 10/05/2013  . Hypokalemia 09/21/2013  . Acute bronchitis 09/20/2013  . SIRS (systemic inflammatory response syndrome) ([place]) 09/20/2013  . Chest pain 09/20/2013  . UTI (lower urinary tract infection) 09/20/2013  . Dyspnea 09/20/2013  . Anemia 02/09/2013  . Sick sinus syndrome ([place]) 01/27/2013  . Premature ventricular contraction 01/27/2013  . CAD (coronary artery disease) 01/27/2013  . Essential hypertension 01/27/2013  . Hx of falling, presenting hazards to health 12/21/2012  . Laboratory test 05/09/2011  . Arteriosclerotic cardiovascular disease (ASCVD)   .  Cerebrovascular disease   . Syncope   . Hyperlipidemia 05/07/2009  . Tobacco abuse 05/07/2009  . Orthostatic hypotension 05/07/2009  . GERD (gastroesophageal reflux disease) 09/19/2008  . Osteopenia 09/19/2008    LABS    Component Value Date/Time   NA 137 03/15/2015 1936   NA 140 03/15/2015 1045   NA 144 10/14/2013 1116   K 5.3* 03/15/2015 1936   K 3.9 03/15/2015 1045   K 3.7 10/14/2013 1116   CL 104 03/15/2015 1936   CL 107 03/15/2015 1045   CL 103 10/14/2013 1116   CO2 27 03/15/2015 1936   CO2 29 03/15/2015 1045   CO2 31 10/14/2013 1116   GLUCOSE 95 03/15/2015 1936   GLUCOSE 83 03/15/2015 1045   GLUCOSE 91 10/14/2013 1116   BUN 16 03/15/2015 1936    BUN 15 03/15/2015 1045   BUN 11 10/14/2013 1116   CREATININE 0.86 03/15/2015 1936   CREATININE 0.75 03/15/2015 1045   CREATININE 0.76 10/14/2013 1116   CREATININE 0.71 01/18/2013 1542   CREATININE 0.82 06/15/2010 0845   CREATININE 0.85 05/10/2010 1213   CALCIUM 8.8* 03/15/2015 1936   CALCIUM 8.8* 03/15/2015 1045   CALCIUM 9.2 10/14/2013 1116   GFRNONAA 59* 03/15/2015 1936   GFRNONAA >60 03/15/2015 1045   GFRNONAA 75* 10/14/2013 1116   GFRAA >60 03/15/2015 1936   GFRAA >60 03/15/2015 1045   GFRAA 87* 10/14/2013 1116   CMP     Component Value Date/Time   NA 137 03/15/2015 1936   K 5.3* 03/15/2015 1936   CL 104 03/15/2015 1936   CO2 27 03/15/2015 1936   GLUCOSE 95 03/15/2015 1936   BUN 16 03/15/2015 1936   CREATININE 0.86 03/15/2015 1936   CREATININE 0.71 01/18/2013 1542   CALCIUM 8.8* 03/15/2015 1936   PROT 6.5 03/15/2015 1936   ALBUMIN 3.9 03/15/2015 1936   AST 25 03/15/2015 1936   ALT 19 03/15/2015 1936   ALKPHOS 54 03/15/2015 1936   BILITOT 0.7 03/15/2015 1936   GFRNONAA 59* 03/15/2015 1936   GFRAA >60 03/15/2015 1936       Component Value Date/Time   WBC 5.8 03/15/2015 1045   WBC 5.8 10/14/2013 1116   WBC 9.3 09/21/2013 0551   HGB 13.7 03/15/2015 1045   HGB 14.3 10/14/2013 1116   HGB 14.0 09/21/2013 0551   HCT 43.8 03/15/2015 1045   HCT 43.5 10/14/2013 1116   HCT 42.0 09/21/2013 0551   MCV 94.4 03/15/2015 1045   MCV 93.8 10/14/2013 1116   MCV 92.9 09/21/2013 0551    Lipid Panel     Component Value Date/Time   CHOL 144 09/20/2013 1042   TRIG 50 09/20/2013 1042   HDL 84 09/20/2013 1042   CHOLHDL 1.7 09/20/2013 1042   VLDL 10 09/20/2013 1042   LDLCALC 50 09/20/2013 1042    ABG    Component Value Date/Time   PHART 7.426* 08/31/2008 1340   PCO2ART 36.3 08/31/2008 1340   PO2ART 89.6 08/31/2008 1340   HCO3 23.4 08/31/2008 1340   TCO2 20.2 08/31/2008 1340   ACIDBASEDEF 0.4 08/31/2008 1340   O2SAT 96.7 08/31/2008 1340     Lab Results  Component  Value Date   TSH 2.680 09/20/2013   BNP (last 3 results) No results for input(s): BNP in the last 8760 hours.  ProBNP (last 3 results) No results for input(s): PROBNP in the last 8760 hours.  Cardiac Panel (last 3 results) No results for input(s): CKTOTAL, CKMB, TROPONINI, RELINDX in the last 72 hours.  Iron/TIBC/Ferritin/ %Sat    Component Value Date/Time   IRON <10* 02/09/2013 1340   TIBC Not calculated due to Iron <10. 02/09/2013 1340   FERRITIN 10 02/09/2013 1340   IRONPCTSAT Not calculated due to Iron <10. 02/09/2013 1340     EKG Orders placed or performed during the hospital encounter of 03/15/15  . EKG 12-Lead (at 6am)  . EKG 12-Lead (at 6am)  . EKG 12-Lead  . EKG 12-Lead  . EKG     Prior Assessment and Plan Problem List as of 03/30/2015      Cardiovascular and Mediastinum   Orthostatic hypotension   Last Assessment & Plan 05/27/2014 Office Visit Written 05/27/2014 11:34 AM by Evans Lance, MD    The patients symptoms get worse when she stands up. I have asked the patient to stop Imdur.       Arteriosclerotic cardiovascular disease (ASCVD)   Last Assessment & Plan 05/27/2014 Office Visit Written 05/27/2014 11:36 AM by Evans Lance, MD    She is s/p CABG. She has had no anginal symptoms. She will use slntg rather than imdur. She is encouraged to increase her physical activity.      Cerebrovascular disease   Last Assessment & Plan 09/02/2012 Office Visit Written 09/02/2012 12:10 PM by Yehuda Savannah, MD    Minimal carotid atherosclerosis when last assessed 8 months ago. Due to patient's advanced age, the benefit of serial testing is probably limited. I certainly would not consider repeat carotid imaging for at least 3 years.      Syncope   Last Assessment & Plan 03/06/2012 Office Visit Edited 03/07/2012 10:44 PM by Yehuda Savannah, MD    Patient continues to have dizziness, but no syncope. Symptoms have improved spontaneously. She did not fill the prescription  previously given to her for meclizine and is encouraged to do so.      Sick sinus syndrome (HCC)   Premature ventricular contraction   CAD (coronary artery disease)   Essential hypertension   Last Assessment & Plan 05/27/2014 Office Visit Written 05/27/2014 11:37 AM by Evans Lance, MD    Her blood pressure is well controlled. With her orthostasis, I would prefer that she run a little higher on the systolic pressure.        Respiratory   Acute bronchitis     Digestive   GERD (gastroesophageal reflux disease)   Last Assessment & Plan 02/05/2012 Office Visit Written 02/05/2012  7:39 PM by Yehuda Savannah, MD    Patient denies a history of acid reflux or hiatal hernia, but if chest discomfort persists, a trial of PPI could be considered.        Musculoskeletal and Integument   Osteopenia     Genitourinary   UTI (lower urinary tract infection)     Other   Hyperlipidemia   Last Assessment & Plan 01/18/2013 Office Visit Written 01/18/2013  4:01 PM by Lendon Colonel, NP    She is followed by PCP Dr. Hilma Favors for labs.      Tobacco abuse   Laboratory test   Hx of falling, presenting hazards to health   Anemia   SIRS (systemic inflammatory response syndrome) (HCC)   Chest pain   Dyspnea   Hypokalemia   Balance disorder   Dizziness of unknown cause   Difficulty in walking(719.7)   Muscle weakness (generalized)   Pacemaker   Last Assessment & Plan 05/27/2014 Office Visit Written 05/27/2014 11:35 AM by Evans Lance, MD  Her [employer] DDD PM is working normally. Will recheck in several months.           Imaging: Dg Chest Port 1 View  03/15/2015  CLINICAL DATA:  Chest pain last night with shortness of breath. Prior smoker. EXAM: PORTABLE CHEST 1 VIEW COMPARISON:  10/14/2013 FINDINGS: The lungs are hyperinflated likely secondary to COPD. There is no focal parenchymal opacity. There is no pleural effusion or pneumothorax. The heart size is mildly enlarged. There is evidence  of prior CABG. There is a dual lead AICD. The osseous structures are unremarkable. IMPRESSION: No active disease. Electronically Signed   By: Kathreen Devoid   On: 03/15/2015 10:35

## 2015-03-30 NOTE — Patient Instructions (Signed)
Medication Instructions:  Your physician recommends that you continue on your current medications as directed. Please refer to the Current Medication list given to you today.   Labwork: NONE  Testing/Procedures: NONE  Follow-Up: Your physician wants you to follow-up in: 6 MONTHS .  You will receive a reminder letter in the mail two months in advance. If you don't receive a letter, please call our office to schedule the follow-up appointment.   Any Other Special Instructions Will Be Listed Below (If Applicable).  WE HAVE SENT IN A REFERRAL TO DR. Laural Golden, SOMEONE FROM THEIR OFFICE WILL CONTACT YOU SOON .   If you need a refill on your cardiac medications before your next appointment, please call your pharmacy.

## 2015-03-30 NOTE — Progress Notes (Signed)
Cardiology Office Note   Date:  03/30/2015   ID:  SUPREET TIM, DOB 02/15/1925, MRN [id number]  PCP:  Purvis Kilts, MD  Cardiologist: Woodroe Chen, NP   Chief Complaint  Patient presents with  . Coronary Artery Disease      History of Present Illness: [name] is a 82 y.o. female who presents for ongoing assessment and management of coronary artery disease, status post coronary artery bypass grafting, chronic dizziness, who is admitted to the hospital after episodic chest pain.she was ruled out for acute coronary artery syndrome, with negative troponin and EKG.  Her dose of Imdur was increased to 60 mg daily.  She is here for followup.   She is without complaints of significant cardiology symptoms.  She remains deconditioned.  The patient continues to have some chest discomfort, especially, substernal, which is associated with a lot of burping and reflux.  He is still reproducible with palpation.  She follow up with her primary care physician, Dr. Hilma Favors, Imdur, was decreased to 30 mg daily, and Dramamine was discontinued due to dizziness.   Past Medical History  Diagnosis Date  . CAD (coronary artery disease)     CABG surgery in 06/2005 for left main disease; normal EF; negative stress nuclear 08/2008  . Cerebrovascular disease     1993-CVA; 08/2008-mild atherosclerosis without focal stenosis  . Syncope      Negative even recorder dated 3/08  . Tobacco abuse     60 pack years; continuing  . Orthostatic hypotension   . Diverticulosis   . Osteopenia   . GERD (gastroesophageal reflux disease)   . Hypertension   . Mental disorder     mild dementia  . Anemia   . Sinus bradycardia     s/p STJ Assurity dual chamber pacemaker by Dr Rayann Heman 02/2013  . Frequent PVCs   . Family history of anesthesia complication     "makes daughter sick" (02/09/2013)  . High cholesterol   . Asthma   . Chronic bronchitis ([place])     "haven't had it in last 2-3 years; used to  get it q yr" (02/09/2013)  . Pneumonia     "couple times; several years ago" (02/09/2013)  . [id number]     "sometimes weekly" (02/09/2013)  . Arthritis     "fingers; back" (02/09/2013)  . Chronic back pain greater than 3 months duration     "nerve damage down into left leg" (02/09/2013)  . Frequent UTI   . Dementia     "more than mild; having more short term memory loss recently; on Aricept" (02/09/2013)  . Dizziness     chronic  . COPD (chronic obstructive pulmonary disease) ([place])   . Stroke [facility])     20-30 yrs ago    Past Surgical History  Procedure Laterality Date  . Abdominal hysterectomy  1990  . Ventral hernia repair    . Appendectomy    . Tonsillectomy    . Colonoscopy  10/10/2011    Procedure: COLONOSCOPY;  Surgeon: Rogene Houston, MD;  [facility];  Service: Endoscopy;  Laterality: N/A;  1230  . Esophagogastroduodenoscopy  10/10/2011    Procedure: ESOPHAGOGASTRODUODENOSCOPY (EGD);  Surgeon: Rogene Houston, MD;  [facility];  Service: Endoscopy;  Laterality: N/A;  . Pacemaker insertion  02/09/2013    STJ Assurity pacemaker implanted by Dr Rayann Heman for symptomatic bradycardia  . Hernia repair    . Coronary artery bypass graft  2007  Left main disease  . Cardiac catheterization  2007  . Cataract extraction, bilateral    . Permanent pacemaker insertion N/A 02/09/2013    Procedure: PERMANENT PACEMAKER INSERTION;  Surgeon: Coralyn Mark, MD;  Location: [facility] CATH LAB;  Service: Cardiovascular;  Laterality: N/A;     Current Outpatient Prescriptions  Medication Sig Dispense Refill  . acetaminophen (TYLENOL) 325 MG tablet Take 650 mg by mouth every 6 (six) hours as needed for mild pain. For pain    . aspirin EC 81 MG tablet Take 81 mg by mouth daily.    Marland Kitchen atorvastatin (LIPITOR) 10 MG tablet TAKE ONE TABLET ONCE DAILY 30 tablet 11  . calcium citrate (CALCITRATE - DOSED IN MG ELEMENTAL CALCIUM) 950 MG tablet Take 200 mg of elemental calcium by mouth at bedtime.     . Calcium-Vitamin D (CALTRATE 600 PLUS-VIT D PO) Take 1 tablet by mouth at bedtime.     . donepezil (ARICEPT) 10 MG tablet Take 10 mg by mouth daily.     . isosorbide mononitrate (IMDUR) 30 MG 24 hr tablet     . Multiple Vitamin (MULTIVITAMIN WITH MINERALS) TABS tablet Take 1 tablet by mouth daily.    . nitroGLYCERIN (NITROSTAT) 0.4 MG SL tablet Place 1 tablet (0.4 mg total) under the tongue every 5 (five) minutes as needed for chest pain. 25 tablet 3  . Omega-3 Fatty Acids (FISH OIL) 1200 MG CAPS Take 1,200 mg by mouth 2 (two) times daily.     Marland Kitchen omeprazole (PRILOSEC) 20 MG capsule Take 1 capsule (20 mg total) by mouth daily. 90 capsule 3  . vitamin B-12 (CYANOCOBALAMIN) 1000 MCG tablet Take 1,000 mcg by mouth daily.      No current facility-administered medications for this visit.    Allergies:   Cephalexin; Contrast media; Iohexol; Penicillins; Shellfish allergy; and Sulfa antibiotics    Social History:  The patient  reports that she quit smoking about 8 years ago. Her smoking use included Cigarettes. She started smoking about 62 years ago. She has a 30 pack-year smoking history. She has never used smokeless tobacco. She reports that she does not drink alcohol or use illicit drugs.   Family History:  The patient's family history is not on file.    ROS: All other systems are reviewed and negative. Unless otherwise mentioned in H&P    PHYSICAL EXAM: VS:  BP 102/64 mmHg  Pulse 70  Ht 5\' 3"  (1.6 m)  Wt 126 lb (57.153 kg)  BMI 22.33 kg/m2  SpO2 98% , BMI Body mass index is 22.33 kg/(m^2). GEN: Well nourished, well developed, in no acute distress HEENT: normal Neck: no JVD, carotid bruits, or masses Cardiac: RRR; no murmurs, rubs, or gallops,no edema  Respiratory:  clear to auscultation bilaterally, normal work of breathing GI: soft, nontender, nondistended, + BS MS: no deformity or atrophy Skin: warm and dry, no rash Neuro:  Strength and sensation are intact Psych: euthymic  mood, full affect  Recent Labs: 03/15/2015: ALT 19; BUN 16; Creatinine, Ser 0.86; Hemoglobin 13.7; Platelets 155; Potassium 5.3*; Sodium 137    Lipid Panel    Component Value Date/Time   CHOL 144 09/20/2013 1042   TRIG 50 09/20/2013 1042   HDL 84 09/20/2013 1042   CHOLHDL 1.7 09/20/2013 1042   VLDL 10 09/20/2013 1042   LDLCALC 50 09/20/2013 1042      Wt Readings from Last 3 Encounters:  03/30/15 126 lb (57.153 kg)  03/28/15 126 lb (57.153 kg)  03/15/15  129 lb 13.6 oz (58.9 kg)     ASSESSMENT AND PLAN:  1. Coronary artery disease:recent evaluation in the emergency room ruled out for ACS.  No plans to repeat any stress testing or echocardiogram at this time.  Symptoms were found to be related to CAD.due to hypotension.  The patient's Imdur was decreased back to 30 mg daily. She should continue aspirin, statin.  2. Substernal chest discomfort, with associated burping, and pressure:she has been seen by GI in the past.  Dr. Laural Golden, and is being referred there for evaluation of esophagitis, dysmotility, and reflux.  She was placed on Prilosec, 20 mg by mouth daily, and Gas-X by primary care.  She may need further evaluation.  At their discretion.  3. Dizziness:chronic for her.  No improvement with decrease in Imdur, Dramamine has been discontinued by primary care.  Noncardiac etiology.  May consider discontinuing isosorbide altogether if she remains soft on her blood pressure.  Her daughter who is with her states that the dizziness is been chronic with her for years.  She is to be careful rising from a sitting to standing position.   Current medicines are reviewed at length with the patient today.    Labs/ tests ordered today include:   Orders Placed This Encounter  Procedures  . Ambulatory referral to Gastroenterology     Disposition:   FU with 6 months Signed, Jory Sims, NP  03/30/2015 4:01 PM    [place]. [address], [place], [place]  [address] Phone: [phone]; Fax: [phone]

## 2015-04-05 ENCOUNTER — Encounter (INDEPENDENT_AMBULATORY_CARE_PROVIDER_SITE_OTHER): Payer: Self-pay | Admitting: *Deleted

## 2015-04-06 ENCOUNTER — Emergency Department (HOSPITAL_COMMUNITY): Payer: Medicare Other

## 2015-04-06 ENCOUNTER — Other Ambulatory Visit: Payer: Self-pay | Admitting: Cardiology

## 2015-04-06 ENCOUNTER — Observation Stay (HOSPITAL_BASED_OUTPATIENT_CLINIC_OR_DEPARTMENT_OTHER)
Admission: EM | Admit: 2015-04-06 | Discharge: 2015-04-09 | Disposition: A | Discharge status: Home / Self Care | Payer: Medicare Other | Source: Home / Self Care | Attending: Internal Medicine | Admitting: Internal Medicine

## 2015-04-06 ENCOUNTER — Encounter (HOSPITAL_COMMUNITY): Payer: Self-pay

## 2015-04-06 DIAGNOSIS — Z95818 Presence of other cardiac implants and grafts: Secondary | ICD-10-CM | POA: Diagnosis not present

## 2015-04-06 DIAGNOSIS — K922 Gastrointestinal hemorrhage, unspecified: Secondary | ICD-10-CM | POA: Diagnosis present

## 2015-04-06 DIAGNOSIS — R0682 Tachypnea, not elsewhere classified: Secondary | ICD-10-CM | POA: Diagnosis not present

## 2015-04-06 DIAGNOSIS — G8929 Other chronic pain: Secondary | ICD-10-CM | POA: Diagnosis not present

## 2015-04-06 DIAGNOSIS — I2510 Atherosclerotic heart disease of native coronary artery without angina pectoris: Secondary | ICD-10-CM | POA: Diagnosis present

## 2015-04-06 DIAGNOSIS — D500 Iron deficiency anemia secondary to blood loss (chronic): Secondary | ICD-10-CM | POA: Diagnosis not present

## 2015-04-06 DIAGNOSIS — G9340 Encephalopathy, unspecified: Secondary | ICD-10-CM

## 2015-04-06 DIAGNOSIS — Z79899 Other long term (current) drug therapy: Secondary | ICD-10-CM

## 2015-04-06 DIAGNOSIS — M19042 Primary osteoarthritis, left hand: Secondary | ICD-10-CM | POA: Insufficient documentation

## 2015-04-06 DIAGNOSIS — J449 Chronic obstructive pulmonary disease, unspecified: Secondary | ICD-10-CM | POA: Insufficient documentation

## 2015-04-06 DIAGNOSIS — D649 Anemia, unspecified: Secondary | ICD-10-CM | POA: Diagnosis present

## 2015-04-06 DIAGNOSIS — J45909 Unspecified asthma, uncomplicated: Secondary | ICD-10-CM | POA: Diagnosis not present

## 2015-04-06 DIAGNOSIS — D62 Acute posthemorrhagic anemia: Secondary | ICD-10-CM

## 2015-04-06 DIAGNOSIS — K219 Gastro-esophageal reflux disease without esophagitis: Secondary | ICD-10-CM

## 2015-04-06 DIAGNOSIS — E785 Hyperlipidemia, unspecified: Secondary | ICD-10-CM | POA: Diagnosis not present

## 2015-04-06 DIAGNOSIS — K449 Diaphragmatic hernia without obstruction or gangrene: Secondary | ICD-10-CM

## 2015-04-06 DIAGNOSIS — E7800 Pure hypercholesterolemia, unspecified: Secondary | ICD-10-CM

## 2015-04-06 DIAGNOSIS — M19041 Primary osteoarthritis, right hand: Secondary | ICD-10-CM

## 2015-04-06 DIAGNOSIS — F0390 Unspecified dementia without behavioral disturbance: Secondary | ICD-10-CM | POA: Insufficient documentation

## 2015-04-06 DIAGNOSIS — E876 Hypokalemia: Secondary | ICD-10-CM | POA: Diagnosis not present

## 2015-04-06 DIAGNOSIS — I493 Ventricular premature depolarization: Secondary | ICD-10-CM

## 2015-04-06 DIAGNOSIS — K921 Melena: Secondary | ICD-10-CM | POA: Diagnosis not present

## 2015-04-06 DIAGNOSIS — Z882 Allergy status to sulfonamides status: Secondary | ICD-10-CM | POA: Diagnosis not present

## 2015-04-06 DIAGNOSIS — Z880 Allergy status to penicillin: Secondary | ICD-10-CM | POA: Diagnosis not present

## 2015-04-06 DIAGNOSIS — R0602 Shortness of breath: Secondary | ICD-10-CM | POA: Diagnosis not present

## 2015-04-06 DIAGNOSIS — I10 Essential (primary) hypertension: Secondary | ICD-10-CM

## 2015-04-06 DIAGNOSIS — Z7982 Long term (current) use of aspirin: Secondary | ICD-10-CM | POA: Insufficient documentation

## 2015-04-06 DIAGNOSIS — Z950 Presence of cardiac pacemaker: Secondary | ICD-10-CM

## 2015-04-06 DIAGNOSIS — R0789 Other chest pain: Secondary | ICD-10-CM | POA: Diagnosis not present

## 2015-04-06 DIAGNOSIS — I495 Sick sinus syndrome: Secondary | ICD-10-CM | POA: Diagnosis not present

## 2015-04-06 DIAGNOSIS — R079 Chest pain, unspecified: Secondary | ICD-10-CM

## 2015-04-06 DIAGNOSIS — Z7983 Long term (current) use of bisphosphonates: Secondary | ICD-10-CM

## 2015-04-06 DIAGNOSIS — Z87891 Personal history of nicotine dependence: Secondary | ICD-10-CM | POA: Insufficient documentation

## 2015-04-06 DIAGNOSIS — Z888 Allergy status to other drugs, medicaments and biological substances status: Secondary | ICD-10-CM | POA: Diagnosis not present

## 2015-04-06 DIAGNOSIS — M479 Spondylosis, unspecified: Secondary | ICD-10-CM

## 2015-04-06 DIAGNOSIS — K3189 Other diseases of stomach and duodenum: Secondary | ICD-10-CM | POA: Diagnosis not present

## 2015-04-06 DIAGNOSIS — Z951 Presence of aortocoronary bypass graft: Secondary | ICD-10-CM | POA: Insufficient documentation

## 2015-04-06 DIAGNOSIS — Z91041 Radiographic dye allergy status: Secondary | ICD-10-CM | POA: Diagnosis not present

## 2015-04-06 DIAGNOSIS — R42 Dizziness and giddiness: Secondary | ICD-10-CM

## 2015-04-06 DIAGNOSIS — K5730 Diverticulosis of large intestine without perforation or abscess without bleeding: Secondary | ICD-10-CM | POA: Diagnosis not present

## 2015-04-06 DIAGNOSIS — Z8673 Personal history of transient ischemic attack (TIA), and cerebral infarction without residual deficits: Secondary | ICD-10-CM

## 2015-04-06 DIAGNOSIS — R531 Weakness: Secondary | ICD-10-CM | POA: Diagnosis not present

## 2015-04-06 DIAGNOSIS — K5520 Angiodysplasia of colon without hemorrhage: Secondary | ICD-10-CM | POA: Diagnosis not present

## 2015-04-06 LAB — VITAMIN B12: Vitamin B-12: 982 pg/mL — ABNORMAL HIGH (ref 180–914)

## 2015-04-06 LAB — BASIC METABOLIC PANEL
ANION GAP: 7 (ref 5–15)
BUN: 20 mg/dL (ref 6–20)
CALCIUM: 8.5 mg/dL — AB (ref 8.9–10.3)
CHLORIDE: 111 mmol/L (ref 101–111)
CO2: 23 mmol/L (ref 22–32)
Creatinine, Ser: 0.79 mg/dL (ref 0.44–1.00)
GFR calc non Af Amer: >60 mL/min (ref >60)
Glucose, Bld: 103 mg/dL — ABNORMAL HIGH (ref 65–99)
POTASSIUM: 4 mmol/L (ref 3.5–5.1)
Sodium: 141 mmol/L (ref 135–145)

## 2015-04-06 LAB — CBC WITH DIFFERENTIAL/PLATELET
BASOS ABS: 0 10*3/uL (ref 0.0–0.1)
Basophils Relative: 1 %
EOS PCT: 1 %
Eosinophils Absolute: 0.1 10*3/uL (ref 0.0–0.7)
HEMATOCRIT: 22.4 % — AB (ref 36.0–46.0)
Hemoglobin: 7.5 g/dL — ABNORMAL LOW (ref 12.0–15.0)
LYMPHS ABS: 1 10*3/uL (ref 0.7–4.0)
LYMPHS PCT: 14 %
MCH: 30.7 pg (ref 26.0–34.0)
MCHC: 33.5 g/dL (ref 30.0–36.0)
MCV: 91.8 fL (ref 78.0–100.0)
MONO ABS: 0.6 10*3/uL (ref 0.1–1.0)
MONOS PCT: 8 %
NEUTROS ABS: 5.8 10*3/uL (ref 1.7–7.7)
Neutrophils Relative %: 78 %
Platelets: 261 10*3/uL (ref 150–400)
RBC: 2.44 MIL/uL — ABNORMAL LOW (ref 3.87–5.11)
RDW: 13.5 % (ref 11.5–15.5)
WBC: 7.5 10*3/uL (ref 4.0–10.5)

## 2015-04-06 LAB — IRON AND TIBC
Iron: 9 ug/dL — ABNORMAL LOW (ref 28–170)
Saturation Ratios: 2 % — ABNORMAL LOW (ref 10.4–31.8)
TIBC: 382 ug/dL (ref 250–450)
UIBC: 373 ug/dL

## 2015-04-06 LAB — URINALYSIS, ROUTINE W REFLEX MICROSCOPIC
Bilirubin Urine: NEGATIVE
Glucose, UA: NEGATIVE mg/dL
KETONES UR: 15 mg/dL — AB
LEUKOCYTES UA: NEGATIVE
NITRITE: NEGATIVE
PH: 5 (ref 5.0–8.0)
PROTEIN: 30 mg/dL — AB
Specific Gravity, Urine: 1.02 (ref 1.005–1.030)

## 2015-04-06 LAB — RETICULOCYTES
RBC.: 2.43 MIL/uL — AB (ref 3.87–5.11)
RETIC COUNT ABSOLUTE: 99.6 10*3/uL (ref 19.0–186.0)
Retic Ct Pct: 4.1 % — ABNORMAL HIGH (ref 0.4–3.1)

## 2015-04-06 LAB — HEPATIC FUNCTION PANEL
ALBUMIN: 3.5 g/dL (ref 3.5–5.0)
ALT: 15 U/L (ref 14–54)
AST: 22 U/L (ref 15–41)
Alkaline Phosphatase: 44 U/L (ref 38–126)
BILIRUBIN INDIRECT: 0.3 mg/dL (ref 0.3–0.9)
Bilirubin, Direct: 0.1 mg/dL (ref 0.1–0.5)
TOTAL PROTEIN: 5.7 g/dL — AB (ref 6.5–8.1)
Total Bilirubin: 0.4 mg/dL (ref 0.3–1.2)

## 2015-04-06 LAB — URINE MICROSCOPIC-ADD ON

## 2015-04-06 LAB — BRAIN NATRIURETIC PEPTIDE: B Natriuretic Peptide: 211 pg/mL — ABNORMAL HIGH (ref 0.0–100.0)

## 2015-04-06 LAB — FOLATE: Folate: 28.7 ng/mL (ref >5.9)

## 2015-04-06 LAB — FERRITIN: FERRITIN: 12 ng/mL (ref 11–307)

## 2015-04-06 LAB — TROPONIN I
Troponin I: <0.03 ng/mL (ref <0.031)
Troponin I: <0.03 ng/mL (ref <0.031)

## 2015-04-06 LAB — PREPARE RBC (CROSSMATCH)

## 2015-04-06 LAB — ABO/RH: ABO/RH(D): O NEG

## 2015-04-06 LAB — D-DIMER, QUANTITATIVE (NOT AT ARMC): D DIMER QUANT: 1.27 ug{FEU}/mL — AB (ref 0.00–0.50)

## 2015-04-06 MED ORDER — IOHEXOL 350 MG/ML SOLN: 100.0000 mL | Freq: Once | INTRAVENOUS | Status: DC | PRN

## 2015-04-06 MED ORDER — PANTOPRAZOLE SODIUM 40 MG IV SOLR
40.0000 mg | Freq: Two times a day (BID) | INTRAVENOUS | Status: DC
  Administered 2015-04-06 – 2015-04-07 (×3): 40 mg via INTRAVENOUS
  Filled 2015-04-06 (×3): qty 40

## 2015-04-06 MED ORDER — ATORVASTATIN CALCIUM 10 MG PO TABS
10.0000 mg | ORAL_TABLET | Freq: Every day | ORAL | Status: DC
  Administered 2015-04-06 – 2015-04-08 (×3): 10 mg via ORAL
  Filled 2015-04-06 (×3): qty 1

## 2015-04-06 MED ORDER — ISOSORBIDE MONONITRATE ER 60 MG PO TB24
30.0000 mg | ORAL_TABLET | Freq: Every day | ORAL | Status: DC
  Administered 2015-04-07 – 2015-04-09 (×3): 30 mg via ORAL
  Filled 2015-04-06 (×3): qty 1

## 2015-04-06 MED ORDER — DIPHENHYDRAMINE HCL 50 MG/ML IJ SOLN
50.0000 mg | Freq: Once | INTRAMUSCULAR | Status: AC
  Administered 2015-04-06: 50 mg via INTRAVENOUS
  Filled 2015-04-06: qty 1

## 2015-04-06 MED ORDER — SODIUM CHLORIDE 0.9 % IV SOLN
INTRAVENOUS | Status: DC
  Administered 2015-04-06: via INTRAVENOUS

## 2015-04-06 MED ORDER — SODIUM CHLORIDE 0.9 % IV SOLN
Freq: Once | INTRAVENOUS | Status: AC
Start: 2015-04-06 — End: 2015-04-06
  Administered 2015-04-06: 15:00:00 via INTRAVENOUS

## 2015-04-06 MED ORDER — IOPAMIDOL (ISOVUE-370) INJECTION 76%
100.0000 mL | Freq: Once | INTRAVENOUS | Status: AC | PRN
  Administered 2015-04-06: 100 mL via INTRAVENOUS

## 2015-04-06 MED ORDER — PANTOPRAZOLE SODIUM 40 MG IV SOLR
40.0000 mg | Freq: Once | INTRAVENOUS | Status: AC
  Administered 2015-04-06: 40 mg via INTRAVENOUS
  Filled 2015-04-06: qty 40

## 2015-04-06 MED ORDER — DONEPEZIL HCL 5 MG PO TABS
10.0000 mg | ORAL_TABLET | Freq: Every day | ORAL | Status: DC
  Administered 2015-04-06 – 2015-04-08 (×3): 10 mg via ORAL
  Filled 2015-04-06 (×3): qty 2

## 2015-04-06 MED ORDER — HYDROCODONE-ACETAMINOPHEN 5-325 MG PO TABS
1.0000 | ORAL_TABLET | ORAL | Status: DC | PRN
  Administered 2015-04-07 – 2015-04-08 (×3): 1 via ORAL
  Filled 2015-04-06 (×3): qty 1

## 2015-04-06 MED ORDER — HYDROCORTISONE SOD SUCCINATE 100 MG IJ SOLR
200.0000 mg | Freq: Once | INTRAMUSCULAR | Status: AC
  Administered 2015-04-06: 200 mg via INTRAVENOUS
  Filled 2015-04-06 (×2): qty 4

## 2015-04-06 MED ORDER — MORPHINE SULFATE (PF) 2 MG/ML IV SOLN: 2.0000 mg | INTRAVENOUS | Status: DC | PRN

## 2015-04-06 MED ORDER — FUROSEMIDE 40 MG PO TABS
20.0000 mg | ORAL_TABLET | Freq: Once | ORAL | Status: AC
  Administered 2015-04-06: 20 mg via ORAL
  Filled 2015-04-06: qty 1

## 2015-04-06 NOTE — ED Notes (Signed)
MD at bedside. 

## 2015-04-06 NOTE — ED Notes (Signed)
Pt requesting to use the restroom.  Assisted pt to chair and she had severe chest pain again and said she couldn't breathe.  02 sat wnl, vss.  EDP aware.  Pt back to bed and says is more comfortable at this time.  EKG was repeated.  Dr. Roderic Palau ordered CT angio.  EDP questioned family about reaction to contrast and daughter states she never reacted to contrast, she had a rash after eating shellfish.  Was informed to discuss protocol for premedication with ct staff.

## 2015-04-06 NOTE — H&P (Signed)
PCP:   Purvis Kilts, MD   Chief Complaint:  Chest pain  HPI:  82 year old female who  has a past medical history of CAD (coronary artery disease); Cerebrovascular disease; Syncope; Tobacco abuse; Orthostatic hypotension; Diverticulosis; Osteopenia; GERD (gastroesophageal reflux disease); Hypertension; Mental disorder; Anemia; Sinus bradycardia; Frequent PVCs; Family history of anesthesia complication; High cholesterol; Asthma; Chronic bronchitis ([place]); Pneumonia; Headache(784.0); Arthritis; Chronic back pain greater than 3 months duration; Frequent UTI; Dementia; Dizziness; COPD (chronic obstructive pulmonary disease) ([place]); and Stroke ([place]). Today came to the ED with complaints of chest pain. Patient also had shortness of breath. Patient has been feeling extremely weak with dizziness going on for past 1 month. She also noticed black colored stool. In the ED lab look revealed anemia with him in some 0.5. Her previous him again on 03/15/2015 was 13.7. She denies nausea vomiting or diarrhea. Denies fever, but has been comparing of dysuria. UA was negative in the ED.  Allergies:   Allergies  Allergen Reactions  . Cephalexin Hives  . Contrast Media [Iodinated Diagnostic Agents]     Unknown  . Iohexol     Urticaria, emesis, numbness during IVP performed at [facility] years ago  . Penicillins Hives    Has patient had a PCN reaction causing immediate rash, facial/tongue/throat swelling, SOB or lightheadedness with hypotension: Yes Has patient had a PCN reaction causing severe rash involving mucus membranes or skin necrosis: No Has patient had a PCN reaction that required hospitalization NoNo Has patient had a PCN reaction occurring within the last 10 years: No If all of the above answers are "NO", then may proceed with Cephalosporin use.   . Shellfish Allergy Other (See Comments)    Unknown  . Sulfa Antibiotics Hives      Past Medical History  Diagnosis Date  . CAD  (coronary artery disease)     CABG surgery in 06/2005 for left main disease; normal EF; negative stress nuclear 08/2008  . Cerebrovascular disease     1993-CVA; 08/2008-mild atherosclerosis without focal stenosis  . Syncope      Negative even recorder dated 3/08  . Tobacco abuse     60 pack years; continuing  . Orthostatic hypotension   . Diverticulosis   . Osteopenia   . GERD (gastroesophageal reflux disease)   . Hypertension   . Mental disorder     mild dementia  . Anemia   . Sinus bradycardia     s/p STJ Assurity dual chamber pacemaker by Dr Rayann Heman 02/2013  . Frequent PVCs   . Family history of anesthesia complication     "makes daughter sick" (02/09/2013)  . High cholesterol   . Asthma   . Chronic bronchitis ([place])     "haven't had it in last 2-3 years; used to get it q yr" (02/09/2013)  . Pneumonia     "couple times; several years ago" (02/09/2013)  . [id number])     "sometimes weekly" (02/09/2013)  . Arthritis     "fingers; back" (02/09/2013)  . Chronic back pain greater than 3 months duration     "nerve damage down into left leg" (02/09/2013)  . Frequent UTI   . Dementia     "more than mild; having more short term memory loss recently; on Aricept" (02/09/2013)  . Dizziness     chronic  . COPD (chronic obstructive pulmonary disease) ([place])   . Stroke [facility])     20-30 yrs ago    Past Surgical History  Procedure Laterality  Date  . Abdominal hysterectomy  1990  . Ventral hernia repair    . Appendectomy    . Tonsillectomy    . Colonoscopy  10/10/2011    Procedure: COLONOSCOPY;  Surgeon: Rogene Houston, MD;  [facility];  Service: Endoscopy;  Laterality: N/A;  1230  . Esophagogastroduodenoscopy  10/10/2011    Procedure: ESOPHAGOGASTRODUODENOSCOPY (EGD);  Surgeon: Rogene Houston, MD;  [facility];  Service: Endoscopy;  Laterality: N/A;  . Pacemaker insertion  02/09/2013    STJ Assurity pacemaker implanted by Dr Rayann Heman for symptomatic bradycardia  . Hernia  repair    . Coronary artery bypass graft  2007    Left main disease  . Cardiac catheterization  2007  . Cataract extraction, bilateral    . Permanent pacemaker insertion N/A 02/09/2013    Procedure: PERMANENT PACEMAKER INSERTION;  Surgeon: Coralyn Mark, MD;  Location: [facility] CATH LAB;  Service: Cardiovascular;  Laterality: N/A;    Prior to Admission medications   Medication Sig Start Date End Date Taking? Authorizing Provider  aspirin EC 81 MG tablet Take 81 mg by mouth daily.   Yes Historical Provider, MD  atorvastatin (LIPITOR) 10 MG tablet TAKE ONE TABLET ONCE DAILY Patient taking differently: TAKE ONE TABLET AT BEDTIME 10/21/14  Yes Herminio Commons, MD  Calcium-Vitamin D (CALTRATE 600 PLUS-VIT D PO) Take 1 tablet by mouth at bedtime.    Yes Historical Provider, MD  donepezil (ARICEPT) 10 MG tablet Take 10 mg by mouth daily.    Yes Historical Provider, MD  isosorbide mononitrate (IMDUR) 30 MG 24 hr tablet Take 30 mg by mouth daily.  02/07/15  Yes Historical Provider, MD  Multiple Vitamin (MULTIVITAMIN WITH MINERALS) TABS tablet Take 1 tablet by mouth daily.   Yes Historical Provider, MD  Multiple Vitamins-Minerals (PRESERVISION AREDS PO) Take 1 capsule by mouth 2 (two) times daily.   Yes Historical Provider, MD  Omega-3 Fatty Acids (FISH OIL) 1200 MG CAPS Take 1,200 mg by mouth at bedtime.    Yes Historical Provider, MD  omeprazole (PRILOSEC) 20 MG capsule Take 1 capsule (20 mg total) by mouth daily. 10/20/14  Yes Herminio Commons, MD  zoledronic acid (RECLAST) 5 MG/100ML SOLN injection Inject 5 mg into the vein once.   Yes Historical Provider, MD  acetaminophen (TYLENOL) 325 MG tablet Take 650 mg by mouth every 6 (six) hours as needed for mild pain. For pain    Historical Provider, MD  NITROSTAT 0.4 MG SL tablet PLACE 1 TABLET UNDER THE TONGUE EVERY 5 MINUTES AS NEEDED FOR CHEST PAIN DO NOT EXCEED 3 DOSES IN 24 HOURS Patient taking differently: PLACE 1 TABLET UNDER THE TONGUE EVERY 5  MINUTES AS NEEDED FOR CHEST PAIN (DO NOT EXCEED 3 DOSES IN 24 HOURS) 04/06/15   Arnoldo Lenis, MD    Social History:  reports that she quit smoking about 8 years ago. Her smoking use included Cigarettes. She started smoking about 62 years ago. She has a 30 pack-year smoking history. She has never used smokeless tobacco. She reports that she does not drink alcohol or use illicit drugs.  Family History  Problem Relation Age of Onset  . Heart disease    . Arthritis    . Lung disease    . Asthma      Filed Weights   04/06/15 1231  Weight: 54.749 kg (120 lb 11.2 oz)    All the positives are listed in BOLD  Review of Systems:  HEENT: Headache, blurred vision, runny nose, sore throat Neck: Hypothyroidism, hyperthyroidism,,lymphadenopathy Chest : Shortness of breath, history of COPD, Asthma Heart : Chest pain, history of coronary arterey disease GI:  Nausea, vomiting, diarrhea, constipation, GERD GU: Dysuria, urgency, frequency of urination, hematuria Neuro: Stroke, seizures, syncope Psych: Depression, anxiety, hallucinations   Physical Exam: Blood pressure 118/62, pulse 62, temperature 98 F (36.7 C), temperature source Oral, resp. rate 17, height 5\' 4"  (1.626 m), weight 54.749 kg (120 lb 11.2 oz), SpO2 99 %. Constitutional:   Patient is a well-developed and well-nourished female* in no acute distress and cooperative with exam. Head: Normocephalic and atraumatic Mouth: Mucus membranes moist Eyes: PERRL, EOMI, conjunctivae normal Neck: Supple, No Thyromegaly Cardiovascular: RRR, S1 normal, S2 normal Pulmonary/Chest: CTAB, no wheezes, rales, or rhonchi Abdominal: Soft. Non-tender, non-distended, bowel sounds are normal, no masses, organomegaly, or guarding present.  Neurological: A&O x3, Strength is normal and symmetric bilaterally, cranial nerve II-XII are grossly intact, no focal motor deficit, sensory intact to light touch bilaterally.  Extremities : No Cyanosis, Clubbing or  Edema  Labs on Admission:  Basic Metabolic Panel:  Recent Labs Lab 04/06/15 1247  NA 141  K 4.0  CL 111  CO2 23  GLUCOSE 103*  BUN 20  CREATININE 0.79  CALCIUM 8.5*   Liver Function Tests:  Recent Labs Lab 04/06/15 1250  AST 22  ALT 15  ALKPHOS 44  BILITOT 0.4  PROT 5.7*  ALBUMIN 3.5     Recent Labs Lab 04/06/15 1247  WBC 7.5  NEUTROABS 5.8  HGB 7.5*  HCT 22.4*  MCV 91.8  PLT 261   Cardiac Enzymes:  Recent Labs Lab 04/06/15 1247  TROPONINI <0.03    BNP (last 3 results)  Recent Labs  04/06/15 1250  BNP 211.0*     Radiological Exams on Admission: Ct Angio Chest Pe W/cm &/or Wo Cm  04/06/2015  CLINICAL DATA:  Sudden onset of chest pain, shortness of breath, cough EXAM: CT ANGIOGRAPHY CHEST WITH CONTRAST TECHNIQUE: Multidetector CT imaging of the chest was performed using the standard protocol during bolus administration of intravenous contrast. Multiplanar CT image reconstructions and MIPs were obtained to evaluate the vascular anatomy. CONTRAST:  100 cc Isovue COMPARISON:  Chest x-ray 04/06/2015 FINDINGS: Mediastinum/Lymph Nodes: 2 leads cardiac pacemaker again noted. The patient is status post median sternotomy. Heart size within normal limits. No pericardial effusion. The study is of excellent technical quality. No pulmonary embolus is noted. No aortic aneurysm. Atherosclerotic calcifications of thoracic aorta. No mediastinal hematoma or adenopathy. There is no hilar adenopathy. Lungs/Pleura: No pleural thickening or pleural effusion. Mild emphysematous changes especially in upper lobes. Small emphysematous bullae are noted in right lower lobe. No infiltrate or pulmonary edema. No focal consolidation. There is no pneumothorax. Bilateral apical scarring. Mild hyperinflation. Upper abdomen: The visualized upper abdomen shows a probable cyst in right hepatic dome measures 1.6 cm. No adrenal gland mass is noted. Musculoskeletal: No destructive rib lesions are  noted. Sagittal images of the spine shows no destructive bony lesions. Alignment, disc spaces and vertebral body heights are preserved. Review of the MIP images confirms the above findings. IMPRESSION: 1. No pulmonary embolus. 2. Dual lead cardiac pacemaker in place. 3. No acute infiltrate or pulmonary edema. Mild emphysematous changes. 4. No mediastinal hematoma or adenopathy. Electronically Signed   By: Lahoma Crocker M.D.   On: 04/06/2015 16:42   Dg Chest Portable 1 View  04/06/2015  CLINICAL DATA:  Anterior chest pain with shortness of breath,  off and on for a long time, worse this morning. EXAM: PORTABLE CHEST 1 VIEW COMPARISON:  Chest x-ray dated 03/15/2015. FINDINGS: Lungs are again hyperexpanded indicating COPD. Lungs appear stable. No evidence of pneumonia. No pleural effusion. No pneumothorax seen. Heart size is upper normal, perhaps mild cardiomegaly, stable. Overall cardiomediastinal silhouette is stable in size and configuration. Left chest wall pacemaker/ AICD stable in position. Median sternotomy wires appear intact and stable in alignment. No acute osseous abnormality seen. IMPRESSION: 1. Hyperexpanded lungs indicating COPD/emphysema. 2. No acute findings.  No evidence of pneumonia. Electronically Signed   By: Franki Cabot M.D.   On: 04/06/2015 12:46    EKG: Independently reviewed. Sinus rhythm   Assessment/Plan Active Problems:   Anemia   Lower GI bleed   Chest pain Likely exacerbated from anemia, we'll admit the patient in telemetry Cycle cardiac enzymes Start morphine when necessary for pain  Anemia Likely from GI bleed. Will transfuse 2 units PRBC Stool guaiac in the ED was positive Keep her nothing by mouth after midnight, consult GI for possible EGD in a.m.  CAD status post CABG Continue Imdur, hold aspirin at this time. Continue statin  Dizziness Patient has been complaining of dizziness for long time. At this time she has severe anemia which can exacerbate  dizziness. Will need elevation for dizziness after anemia is resolved.  DVT prophylaxis SCDs  Code status: Full code  Family discussion: Admission, patients condition and plan of care including tests being ordered have been discussed with the patient and her son, daughter, husband at bedside* who indicate understanding and agree with the plan and Code Status.   Time Spent on Admission: 60 min  [facility] Pager: [phone] 04/06/2015, 8:19 PM  If 7PM-7AM, please contact night-coverage  [email or web address]  Password [email or web address]

## 2015-04-06 NOTE — ED Notes (Signed)
Patient complaining of sharp chest pains at this time. RN made aware. Repeat EKG done .

## 2015-04-06 NOTE — ED Notes (Signed)
Pt reports was drinking her coffee this morning and had sudden onset of sharp chest pain.  Pt says pain felt like a knot and stayed in center of chest.  Pt took 4 baby aspirin and took 2 nitro.  Reports the nitro helped the pain but it didn't completely go away.  Denies n/v but reports sob.  Pt also tender to palpation.

## 2015-04-06 NOTE — ED Provider Notes (Signed)
CSN: [id number]     Arrival date & time 04/06/15  1225 History  By signing my name below, I, Stephania Fragmin, attest that this documentation has been prepared under the direction and in the presence of Milton Ferguson, MD. Electronically Signed: Stephania Fragmin, ED Scribe. 04/06/2015. 1:00 PM.    Chief Complaint  Patient presents with  . Chest Pain   Patient is a 82 y.o. female presenting with chest pain. The history is provided by the patient. No language interpreter was used.  Chest Pain Pain quality: sharp   Onset quality:  Sudden Duration:  3 hours Timing:  Constant Chronicity:  Recurrent Context: at rest   Relieved by:  Nitroglycerin Worsened by:  Nothing tried Ineffective treatments:  None tried Associated symptoms: cough, shortness of breath and weakness (generalized)   Associated symptoms: no abdominal pain, no back pain, no fatigue, no fever and no headache    HPI Comments: [name] is a 82 y.o. female with a history of CAD, CVA, frequent PVCs, CVA, COPD (not on home oxygen therapy normally), CABG, and pacemaker insertion, who presents to the Emergency Department complaining of sudden-onset, sharp, center chest pain that began when patient was drinking coffee this morning. She also notes associated SOB, cough, and dysuria that began this morning. She states she had taken 2 baby aspirin tablets and 2 NTG PTA. The NTG alleviated her pain somewhat. Her relative states patient frequently has chest pain, although today, she was more weak than usual and could not ambulate without assistance to the car. Patient states she is not normally on home oxygen therapy. She denies any fever.   PCP: Dr. Hilma Favors Cardiologist: Dr. Jacinta Shoe  Past Medical History  Diagnosis Date  . CAD (coronary artery disease)     CABG surgery in 06/2005 for left main disease; normal EF; negative stress nuclear 08/2008  . Cerebrovascular disease     1993-CVA; 08/2008-mild atherosclerosis without focal stenosis  .  Syncope      Negative even recorder dated 3/08  . Tobacco abuse     60 pack years; continuing  . Orthostatic hypotension   . Diverticulosis   . Osteopenia   . GERD (gastroesophageal reflux disease)   . Hypertension   . Mental disorder     mild dementia  . Anemia   . Sinus bradycardia     s/p STJ Assurity dual chamber pacemaker by Dr Rayann Heman 02/2013  . Frequent PVCs   . Family history of anesthesia complication     "makes daughter sick" (02/09/2013)  . High cholesterol   . Asthma   . Chronic bronchitis ([place])     "haven't had it in last 2-3 years; used to get it q yr" (02/09/2013)  . Pneumonia     "couple times; several years ago" (02/09/2013)  . [id number])     "sometimes weekly" (02/09/2013)  . Arthritis     "fingers; back" (02/09/2013)  . Chronic back pain greater than 3 months duration     "nerve damage down into left leg" (02/09/2013)  . Frequent UTI   . Dementia     "more than mild; having more short term memory loss recently; on Aricept" (02/09/2013)  . Dizziness     chronic  . COPD (chronic obstructive pulmonary disease) ([place])   . Stroke [facility])     20-30 yrs ago   Past Surgical History  Procedure Laterality Date  . Abdominal hysterectomy  1990  . Ventral hernia repair    . Appendectomy    .  Tonsillectomy    . Colonoscopy  10/10/2011    Procedure: COLONOSCOPY;  Surgeon: Rogene Houston, MD;  Location: [facility];  Service: Endoscopy;  Laterality: N/A;  1230  . Esophagogastroduodenoscopy  10/10/2011    Procedure: ESOPHAGOGASTRODUODENOSCOPY (EGD);  Surgeon: Rogene Houston, MD;  Location: [facility];  Service: Endoscopy;  Laterality: N/A;  . Pacemaker insertion  02/09/2013    STJ Assurity pacemaker implanted by Dr Rayann Heman for symptomatic bradycardia  . Hernia repair    . Coronary artery bypass graft  2007    Left main disease  . Cardiac catheterization  2007  . Cataract extraction, bilateral    . Permanent pacemaker insertion N/A 02/09/2013    Procedure: PERMANENT  PACEMAKER INSERTION;  Surgeon: Coralyn Mark, MD;  Location: [facility] CATH LAB;  Service: Cardiovascular;  Laterality: N/A;   Family History  Problem Relation Age of Onset  . Heart disease    . Arthritis    . Lung disease    . Asthma     Social History  Substance Use Topics  . Smoking status: Former Smoker -- 0.50 packs/day for 60 years    Types: Cigarettes    Start date: 12/12/1952    Quit date: 10/20/2006  . Smokeless tobacco: Never Used     Comment: 02/09/2013 "quit smoking in ~ 2009"  . Alcohol Use: No   OB History    Gravida Para Term Preterm AB TAB SAB Ectopic Multiple Living   3 3 3             Review of Systems  Constitutional: Negative for fever, appetite change and fatigue.  HENT: Negative for congestion, ear discharge and sinus pressure.   Eyes: Negative for discharge.  Respiratory: Positive for cough and shortness of breath.   Cardiovascular: Positive for chest pain.  Gastrointestinal: Negative for abdominal pain and diarrhea.  Genitourinary: Positive for dysuria. Negative for frequency and hematuria.  Musculoskeletal: Negative for back pain.  Skin: Negative for rash.  Neurological: Positive for weakness (generalized). Negative for seizures and headaches.  Psychiatric/Behavioral: Negative for hallucinations.    Allergies  Cephalexin; Contrast media; Iohexol; Penicillins; Shellfish allergy; and Sulfa antibiotics  Home Medications    There were no vitals taken for this visit. Physical Exam  Constitutional: She is oriented to person, place, and time. She appears well-developed.  Generalized weakness.  HENT:  Head: Normocephalic.  Eyes: Conjunctivae and EOM are normal. No scleral icterus.  Neck: Neck supple. No thyromegaly present.  Cardiovascular: Normal rate and regular rhythm.  Exam reveals no gallop and no friction rub.   No murmur heard. Pulmonary/Chest: No stridor. She has no wheezes. She has no rales. She exhibits no tenderness.  Abdominal: She exhibits  no distension. There is no tenderness. There is no rebound.  Genitourinary:  Dark red stool heme-positive  Musculoskeletal: Normal range of motion. She exhibits no edema.  Lymphadenopathy:    She has no cervical adenopathy.  Neurological: She is oriented to person, place, and time. She exhibits normal muscle tone. Coordination normal.  Skin: No rash noted. No erythema.  Psychiatric: She has a normal mood and affect. Her behavior is normal.  Nursing note and vitals reviewed.   ED Course  Procedures (including critical care time)  DIAGNOSTIC STUDIES: Oxygen Saturation is 98% on RA, normal by my interpretation.    COORDINATION OF CARE: 12:59 PM - Discussed treatment plan with pt and her family at bedside. Pt and her family verbalized understanding and agreed to plan.  Labs Review Labs Reviewed  CBC WITH DIFFERENTIAL/PLATELET - Abnormal; Notable for the following:    RBC 2.44 (*)    Hemoglobin 7.5 (*)    HCT 22.4 (*)    All other components within normal limits  BASIC METABOLIC PANEL  TROPONIN I  D-DIMER, QUANTITATIVE (NOT AT [facility])  HEPATIC FUNCTION PANEL  BRAIN NATRIURETIC PEPTIDE    Imaging Review Dg Chest Portable 1 View  04/06/2015  CLINICAL DATA:  Anterior chest pain with shortness of breath, off and on for a long time, worse this morning. EXAM: PORTABLE CHEST 1 VIEW COMPARISON:  Chest x-ray dated 03/15/2015. FINDINGS: Lungs are again hyperexpanded indicating COPD. Lungs appear stable. No evidence of pneumonia. No pleural effusion. No pneumothorax seen. Heart size is upper normal, perhaps mild cardiomegaly, stable. Overall cardiomediastinal silhouette is stable in size and configuration. Left chest wall pacemaker/ AICD stable in position. Median sternotomy wires appear intact and stable in alignment. No acute osseous abnormality seen. IMPRESSION: 1. Hyperexpanded lungs indicating COPD/emphysema. 2. No acute findings.  No evidence of pneumonia. Electronically Signed   By: Franki Cabot M.D.   On: 04/06/2015 12:46   I have personally reviewed and evaluated these images and lab results as part of my medical decision-making.   EKG Interpretation   Date/Time:  Thursday April 06 2015 12:32:26 EDT Ventricular Rate:  64 PR Interval:  138 QRS Duration: 104 QT Interval:  415 QTC Calculation: 428 R Axis:   79 Text Interpretation:  Sinus rhythm Repol abnrm suggests ischemia, diffuse  leads Baseline wander in lead(s) II III aVR aVL aVF Confirmed by Lonny Eisen   MD, Alexei Doswell ([id number]) on 04/06/2015 12:42:35 PM      MDM   Final diagnoses:  None     Anemia and lower GI bleed patient will be admitted given blood and see the GI doctor.   The chart was scribed for me under my direct supervision.  I personally performed the history, physical, and medical decision making and all procedures in the evaluation of this patient.Milton Ferguson, MD 04/06/15 [phone]

## 2015-04-07 DIAGNOSIS — K922 Gastrointestinal hemorrhage, unspecified: Secondary | ICD-10-CM

## 2015-04-07 DIAGNOSIS — R079 Chest pain, unspecified: Secondary | ICD-10-CM

## 2015-04-07 DIAGNOSIS — D649 Anemia, unspecified: Secondary | ICD-10-CM

## 2015-04-07 LAB — COMPREHENSIVE METABOLIC PANEL
ALK PHOS: 37 U/L — AB (ref 38–126)
ALT: 16 U/L (ref 14–54)
AST: 20 U/L (ref 15–41)
Albumin: 3.4 g/dL — ABNORMAL LOW (ref 3.5–5.0)
Anion gap: 9 (ref 5–15)
BUN: 20 mg/dL (ref 6–20)
CALCIUM: 8.1 mg/dL — AB (ref 8.9–10.3)
CO2: 24 mmol/L (ref 22–32)
CREATININE: 0.79 mg/dL (ref 0.44–1.00)
Chloride: 108 mmol/L (ref 101–111)
Glucose, Bld: 109 mg/dL — ABNORMAL HIGH (ref 65–99)
Potassium: 3.8 mmol/L (ref 3.5–5.1)
Sodium: 141 mmol/L (ref 135–145)
Total Bilirubin: 0.4 mg/dL (ref 0.3–1.2)
Total Protein: 5.6 g/dL — ABNORMAL LOW (ref 6.5–8.1)

## 2015-04-07 LAB — CBC
HCT: 25.5 % — ABNORMAL LOW (ref 36.0–46.0)
HCT: 31.4 % — ABNORMAL LOW (ref 36.0–46.0)
HEMOGLOBIN: 8.4 g/dL — AB (ref 12.0–15.0)
Hemoglobin: 10.6 g/dL — ABNORMAL LOW (ref 12.0–15.0)
MCH: 29.8 pg (ref 26.0–34.0)
MCH: 29.4 pg (ref 26.0–34.0)
MCHC: 32.9 g/dL (ref 30.0–36.0)
MCHC: 33.8 g/dL (ref 30.0–36.0)
MCV: 90.4 fL (ref 78.0–100.0)
MCV: 87 fL (ref 78.0–100.0)
PLATELETS: 222 10*3/uL (ref 150–400)
Platelets: 255 10*3/uL (ref 150–400)
RBC: 2.82 MIL/uL — AB (ref 3.87–5.11)
RBC: 3.61 MIL/uL — ABNORMAL LOW (ref 3.87–5.11)
RDW: 14.5 % (ref 11.5–15.5)
RDW: 14.7 % (ref 11.5–15.5)
WBC: 7 10*3/uL (ref 4.0–10.5)
WBC: 8.5 10*3/uL (ref 4.0–10.5)

## 2015-04-07 LAB — TROPONIN I
Troponin I: <0.03 ng/mL (ref <0.031)
Troponin I: <0.03 ng/mL (ref <0.031)

## 2015-04-07 LAB — OCCULT BLOOD, POC DEVICE: FECAL OCCULT BLD: POSITIVE — AB

## 2015-04-07 MED ORDER — LORAZEPAM 0.5 MG PO TABS
0.5000 mg | ORAL_TABLET | Freq: Once | ORAL | Status: AC
Start: 2015-04-07 — End: 2015-04-07
  Administered 2015-04-07: 0.5 mg via ORAL
  Filled 2015-04-07: qty 1

## 2015-04-07 MED ORDER — SIMETHICONE 80 MG PO CHEW
80.0000 mg | CHEWABLE_TABLET | Freq: Four times a day (QID) | ORAL | Status: DC | PRN
  Administered 2015-04-07 – 2015-04-09 (×2): 80 mg via ORAL
  Filled 2015-04-07 (×2): qty 1

## 2015-04-07 MED ORDER — HALOPERIDOL LACTATE 5 MG/ML IJ SOLN
2.0000 mg | Freq: Four times a day (QID) | INTRAMUSCULAR | Status: DC | PRN
Start: 2015-04-07 — End: 2015-04-09
  Administered 2015-04-07: 2 mg via INTRAVENOUS
  Filled 2015-04-07: qty 1

## 2015-04-07 MED ORDER — SODIUM CHLORIDE 0.9 % IV SOLN: INTRAVENOUS | Status: DC

## 2015-04-07 MED ORDER — ALBUTEROL SULFATE (2.5 MG/3ML) 0.083% IN NEBU: 2.5000 mg | INHALATION_SOLUTION | Freq: Four times a day (QID) | RESPIRATORY_TRACT | Status: DC | PRN

## 2015-04-07 NOTE — Progress Notes (Signed)
[facility] PROGRESS NOTE  ERINNE GLADISH [id number] DOB: 1925/05/05 DOA: 04/06/2015 PCP: Purvis Kilts, MD  Assessment/Plan: 82 year old female who  has a past medical history of CAD (coronary artery disease); Cerebrovascular disease; Syncope; Tobacco abuse; Orthostatic hypotension; Diverticulosis; Osteopenia, came to the ED with complaints of chest pain. Patient also had shortness of breath. Patient has been feeling extremely weak with dizziness going on for past 1 month. She also noticed black colored stool. Hb on admission at 7. 3 weeks ago at 13.   1-Chest pain; likely demand related to low hb.  Chest pain has resolved. Hb at 10. Troponin times 2 negative.   2-Acute blood loss anemia, GI bleed; Iron deficiency anemia;  Occult blood positive, report of black stool.  Received 2 units of PRBC. Continue with protonix.  Awaiting GI evaluation.   CAD status post CABG Continue Imdur, hold aspirin at this time. Continue statin  Dizziness; Chronic problem, that likely got worse in setting of anemia; patient is feeling better. Further work up by PCP./    Code Status: Full code.  Family Communication: son at bedside.  Disposition Plan: remain inpatient for evaluation of anemia.    Consultants:  GI  Procedures:  none  Antibiotics:  none  HPI/Subjective: She is feeling better, dizziness has improved.  Denies chest pain or dyspnea.   Objective: Filed Vitals:   04/07/15 0600 04/07/15 0843  BP: 120/63 127/57  Pulse: 60 67  Temp: 97.8 F (36.6 C) 97.6 F (36.4 C)  Resp: 20 20    Intake/Output Summary (Last 24 hours) at 04/07/15 1326 Last data filed at 04/07/15 1034  Gross per 24 hour  Intake   1000 ml  Output    525 ml  Net    475 ml   Filed Weights   04/06/15 1231 04/06/15 2135  Weight: 54.749 kg (120 lb 11.2 oz) 56 kg (123 lb 7.3 oz)    Exam:   General:  NAD  Cardiovascular: S 1, S 2 RRR  Respiratory: CTA  Abdomen: BS present, soft,  nt  Musculoskeletal: no edema  Data Reviewed: Basic Metabolic Panel:  Recent Labs Lab 04/06/15 1247 04/07/15 0529  NA 141 141  K 4.0 3.8  CL 111 108  CO2 23 24  GLUCOSE 103* 109*  BUN 20 20  CREATININE 0.79 0.79  CALCIUM 8.5* 8.1*   Liver Function Tests:  Recent Labs Lab 04/06/15 1250 04/07/15 0529  AST 22 20  ALT 15 16  ALKPHOS 44 37*  BILITOT 0.4 0.4  PROT 5.7* 5.6*  ALBUMIN 3.5 3.4*   No results for input(s): LIPASE, AMYLASE in the last 168 hours. No results for input(s): AMMONIA in the last 168 hours. CBC:  Recent Labs Lab 04/06/15 1247 04/07/15 0529 04/07/15 1048  WBC 7.5 7.0 8.5  NEUTROABS 5.8  --   --   HGB 7.5* 8.4* 10.6*  HCT 22.4* 25.5* 31.4*  MCV 91.8 90.4 87.0  PLT 261 255 222   Cardiac Enzymes:  Recent Labs Lab 04/06/15 1247 04/06/15 2022 04/07/15 1044  TROPONINI <0.03 <0.03 <0.03   BNP (last 3 results)  Recent Labs  04/06/15 1250  BNP 211.0*    ProBNP (last 3 results) No results for input(s): PROBNP in the last 8760 hours.  CBG: No results for input(s): GLUCAP in the last 168 hours.  No results found for this or any previous visit (from the past 240 hour(s)).   Studies: Ct Angio Chest Pe W/cm &/or Wo Cm  04/06/2015  CLINICAL DATA:  Sudden onset of chest pain, shortness of breath, cough EXAM: CT ANGIOGRAPHY CHEST WITH CONTRAST TECHNIQUE: Multidetector CT imaging of the chest was performed using the standard protocol during bolus administration of intravenous contrast. Multiplanar CT image reconstructions and MIPs were obtained to evaluate the vascular anatomy. CONTRAST:  100 cc Isovue COMPARISON:  Chest x-ray 04/06/2015 FINDINGS: Mediastinum/Lymph Nodes: 2 leads cardiac pacemaker again noted. The patient is status post median sternotomy. Heart size within normal limits. No pericardial effusion. The study is of excellent technical quality. No pulmonary embolus is noted. No aortic aneurysm. Atherosclerotic calcifications of  thoracic aorta. No mediastinal hematoma or adenopathy. There is no hilar adenopathy. Lungs/Pleura: No pleural thickening or pleural effusion. Mild emphysematous changes especially in upper lobes. Small emphysematous bullae are noted in right lower lobe. No infiltrate or pulmonary edema. No focal consolidation. There is no pneumothorax. Bilateral apical scarring. Mild hyperinflation. Upper abdomen: The visualized upper abdomen shows a probable cyst in right hepatic dome measures 1.6 cm. No adrenal gland mass is noted. Musculoskeletal: No destructive rib lesions are noted. Sagittal images of the spine shows no destructive bony lesions. Alignment, disc spaces and vertebral body heights are preserved. Review of the MIP images confirms the above findings. IMPRESSION: 1. No pulmonary embolus. 2. Dual lead cardiac pacemaker in place. 3. No acute infiltrate or pulmonary edema. Mild emphysematous changes. 4. No mediastinal hematoma or adenopathy. Electronically Signed   By: Lahoma Crocker M.D.   On: 04/06/2015 16:42   Dg Chest Portable 1 View  04/06/2015  CLINICAL DATA:  Anterior chest pain with shortness of breath, off and on for a long time, worse this morning. EXAM: PORTABLE CHEST 1 VIEW COMPARISON:  Chest x-ray dated 03/15/2015. FINDINGS: Lungs are again hyperexpanded indicating COPD. Lungs appear stable. No evidence of pneumonia. No pleural effusion. No pneumothorax seen. Heart size is upper normal, perhaps mild cardiomegaly, stable. Overall cardiomediastinal silhouette is stable in size and configuration. Left chest wall pacemaker/ AICD stable in position. Median sternotomy wires appear intact and stable in alignment. No acute osseous abnormality seen. IMPRESSION: 1. Hyperexpanded lungs indicating COPD/emphysema. 2. No acute findings.  No evidence of pneumonia. Electronically Signed   By: Franki Cabot M.D.   On: 04/06/2015 12:46    Scheduled Meds: . atorvastatin  10 mg Oral q1800  . donepezil  10 mg Oral QHS  .  isosorbide mononitrate  30 mg Oral Daily  . pantoprazole (PROTONIX) IV  40 mg Intravenous Q12H   Continuous Infusions: . sodium chloride 10 mL/hr at 04/06/15 2330    Active Problems:   CAD (coronary artery disease)   Anemia   Chest pain   Lower GI bleed    Time spent: 25 minutes.     Niel Hummer A  [facility] Pager [phone]. If 7PM-7AM, please contact night-coverage at [email or web address], password [facility] 04/07/2015, 1:26 PM  LOS: 1 day

## 2015-04-07 NOTE — Consult Note (Signed)
Referring Provider: Niel Hummer, MD Primary Care Physician:  Purvis Kilts, MD Primary Gastroenterologist:  Dr. Laural Golden  Reason for Consultation:    Anemia and GI bleed.  HPI:   History obtained from 2 children who were at bedside.  [name] with multiple medical problems including dementia who has chronic or daily chest pain was briefly hospitalized on 03/15/2015 and discharged a day later for chest pain. She was noted to have hemoglobin of 13.5 g. Yesterday morning patient's daughter [name] went to see her mother and noted her to be extremely weak. She could not even get up and walk. She was therefore brought to emergency room by EMS for evaluation. In emergency room she was noted to have hemoglobin of 7.5 g and rectal examination revealed dark red blood. Patient was hospitalized and she has received 2 units of PRBCs. Her hemoglobin is up to 10.6 now. There is no history of melena or rectal bleeding. He is also no history of vaginal bleeding or hematuria. Patient has had poor appetite. She has lost 6-7 pounds this month. She is been burping frequently but no nausea or vomiting reported. She is on omeprazole for GERD. She denies heartburn. She has chronic chest pain felt to be noncardiac. Her troponin levels 3 and normal this admission. She has CT NG abdomen on admission was negative for PE. Patient is on low-dose aspirin. She had been taking ibuprofen until about 2 months ago.  According to her daughter she has intermittent fecal and urinary incontinence.  Patient was evaluated for GI bleed and anemia in October 2013 and underwent EGD and colonoscopy. EGD was normal and colonoscopy revealed few small left-sided AV malformations in diverticulosis.  Patient lives at home with her husband.   Past Medical History  Diagnosis Date  . CAD (coronary artery disease)     CABG surgery in 06/2005 for left main disease; normal EF; negative stress nuclear 08/2008   . Cerebrovascular disease     1993-CVA; 08/2008-mild atherosclerosis without focal stenosis  . Syncope      Negative even recorder dated 3/08  . Tobacco abuse     60 pack years; continuing  . Orthostatic hypotension   . Diverticulosis   . Osteopenia   . GERD (gastroesophageal reflux disease)   . Hypertension   . Mental disorder     mild dementia  . Anemia   . Sinus bradycardia     s/p STJ Assurity dual chamber pacemaker by Dr Rayann Heman 02/2013  . Frequent PVCs   . Family history of anesthesia complication     "makes daughter sick" (02/09/2013)  . High cholesterol   . Asthma   . Chronic bronchitis ([place])     "haven't had it in last 2-3 years; used to get it q yr" (02/09/2013)  . Pneumonia     "couple times; several years ago" (02/09/2013)  . [id number])     "sometimes weekly" (02/09/2013)  . Arthritis     "fingers; back" (02/09/2013)  . Chronic back pain greater than 3 months duration     "nerve damage down into left leg" (02/09/2013)  . Frequent UTI   . Dementia     "more than mild; having more short term memory loss recently; on Aricept" (02/09/2013)  . Dizziness     chronic  . COPD (chronic obstructive pulmonary disease) ([place])   . Stroke [facility])     20-30 yrs ago    Past Surgical History  Procedure Laterality Date  . Abdominal  hysterectomy  1990  . Ventral hernia repair    . Appendectomy    . Tonsillectomy    . Colonoscopy  10/10/2011    Procedure: COLONOSCOPY;  Surgeon: Rogene Houston, MD;  Location: [facility];  Service: Endoscopy;  Laterality: N/A;  1230  . Esophagogastroduodenoscopy  10/10/2011    Procedure: ESOPHAGOGASTRODUODENOSCOPY (EGD);  Surgeon: Rogene Houston, MD;  Location: [facility];  Service: Endoscopy;  Laterality: N/A;  . Pacemaker insertion  02/09/2013    STJ Assurity pacemaker implanted by Dr Rayann Heman for symptomatic bradycardia  . Hernia repair    . Coronary artery bypass graft  2007    Left main disease  . Cardiac catheterization  2007  . Cataract  extraction, bilateral    . Permanent pacemaker insertion N/A 02/09/2013    Procedure: PERMANENT PACEMAKER INSERTION;  Surgeon: Coralyn Mark, MD;  Location: [facility]ATH [facility];  Service: Cardiovascular;  Laterality: N/A;    Prior to Admission medications   Medication Sig Start Date End Date Taking? Authorizing Provider  aspirin EC 81 MG tablet Take 81 mg by mouth daily.   Yes Historical Provider, MD  atorvastatin (LIPITOR) 10 MG tablet TAKE ONE TABLET ONCE DAILY Patient taking differently: TAKE ONE TABLET AT BEDTIME 10/21/14  Yes Herminio Commons, MD  Calcium-Vitamin D (CALTRATE 600 PLUS-VIT D PO) Take 1 tablet by mouth at bedtime.    Yes Historical Provider, MD  donepezil (ARICEPT) 10 MG tablet Take 10 mg by mouth daily.    Yes Historical Provider, MD  isosorbide mononitrate (IMDUR) 30 MG 24 hr tablet Take 30 mg by mouth daily.  02/07/15  Yes Historical Provider, MD  Multiple Vitamin (MULTIVITAMIN WITH MINERALS) TABS tablet Take 1 tablet by mouth daily.   Yes Historical Provider, MD  Multiple Vitamins-Minerals (PRESERVISION AREDS PO) Take 1 capsule by mouth 2 (two) times daily.   Yes Historical Provider, MD  Omega-3 Fatty Acids (FISH OIL) 1200 MG CAPS Take 1,200 mg by mouth at bedtime.    Yes Historical Provider, MD  omeprazole (PRILOSEC) 20 MG capsule Take 1 capsule (20 mg total) by mouth daily. 10/20/14  Yes Herminio Commons, MD  zoledronic acid (RECLAST) 5 MG/100ML SOLN injection Inject 5 mg into the vein once.   Yes Historical Provider, MD  acetaminophen (TYLENOL) 325 MG tablet Take 650 mg by mouth every 6 (six) hours as needed for mild pain. For pain    Historical Provider, MD  NITROSTAT 0.4 MG SL tablet PLACE 1 TABLET UNDER THE TONGUE EVERY 5 MINUTES AS NEEDED FOR CHEST PAIN DO NOT EXCEED 3 DOSES IN 24 HOURS Patient taking differently: PLACE 1 TABLET UNDER THE TONGUE EVERY 5 MINUTES AS NEEDED FOR CHEST PAIN (DO NOT EXCEED 3 DOSES IN 24 HOURS) 04/06/15   Arnoldo Lenis, MD    Current  Facility-Administered Medications  Medication Dose Route Frequency Provider Last Rate Last Dose  . 0.9 %  sodium chloride infusion   Intravenous Continuous Oswald Hillock, MD 10 mL/hr at 04/06/15 2330    . albuterol (PROVENTIL) (2.5 MG/3ML) 0.083% nebulizer solution 2.5 mg  2.5 mg Nebulization Q6H PRN [name] Regalado, MD      . atorvastatin (LIPITOR) tablet 10 mg  10 mg Oral q1800 Oswald Hillock, MD   10 mg at 04/06/15 2328  . donepezil (ARICEPT) tablet 10 mg  10 mg Oral QHS Oswald Hillock, MD   10 mg at 04/06/15 2328  . HYDROcodone-acetaminophen (NORCO/VICODIN) 5-325 MG per tablet  1-2 tablet  1-2 tablet Oral Q4H PRN Oswald Hillock, MD   1 tablet at 04/07/15 1422  . isosorbide mononitrate (IMDUR) 24 hr tablet 30 mg  30 mg Oral Daily Oswald Hillock, MD   30 mg at 04/07/15 1028  . morphine 2 MG/ML injection 2 mg  2 mg Intravenous Q4H PRN Oswald Hillock, MD      . pantoprazole (PROTONIX) injection 40 mg  40 mg Intravenous Q12H Oswald Hillock, MD   40 mg at 04/07/15 1027  . simethicone (MYLICON) chewable tablet 80 mg  80 mg Oral Q6H PRN Elmarie Shiley, MD        Allergies as of 04/06/2015 - Review Complete 04/06/2015  Allergen Reaction Noted  . Cephalexin Hives 09/19/2008  . Contrast media [iodinated diagnostic agents]  02/19/2011  . Iohexol  04/23/2003  . Penicillins Hives 09/19/2008  . Shellfish allergy Other (See Comments) 02/19/2011  . Sulfa antibiotics Hives 02/19/2011    Family History  Problem Relation Age of Onset  . Heart disease    . Arthritis    . Lung disease    . Asthma      Social History   Social History  . Marital Status: Married    Spouse Name: Percell Miller   . Number of Children: N/A  . Years of Education: 12   Occupational History  . retired    Social History Main Topics  . Smoking status: Former Smoker -- 0.50 packs/day for 60 years    Types: Cigarettes    Start date: 12/12/1952    Quit date: 10/20/2006  . Smokeless tobacco: Never Used     Comment: 02/09/2013 "quit  smoking in ~ 2009"  . Alcohol Use: No  . Drug Use: No  . Sexual Activity: No   Other Topics Concern  . Not on file   Social History Narrative   3 children    Right handed   HS    Drinks decaff    Review of Systems: See HPI, otherwise normal ROS  Physical Exam: Temp:  [97.5 F (36.4 C)-98.1 F (36.7 C)] 97.6 F (36.4 C) (03/31 0843) Pulse Rate:  [60-70] 67 (03/31 0843) Resp:  [13-22] 20 (03/31 0843) BP: (106-133)/(57-86) 127/57 mmHg (03/31 0843) SpO2:  [95 %-100 %] 97 % (03/31 1042) Weight:  [123 lb 7.3 oz (56 kg)] 123 lb 7.3 oz (56 kg) (03/30 2135) Last BM Date: 04/07/15   Patient is alert and in no acute distress. She responds appropriately to simple questions. Conjunctiva is pale. Sclerae nonicteric. No neck masses or thyromegaly noted. Oropharyngeal mucosa is normal. No neck masses or thyromegaly noted. Cardiac exam with regular rhythm normal S1 and S2. No murmur or gallop noted. Lungs are clear to auscultation. Abdomen is symmetrical. She has lower midline and right low quadrant abdominal scars. On palpation is soft and nontender without organomegaly or masses. No LE edema or clubbing noted.   Lab Results:  Recent Labs  04/06/15 1247 04/07/15 0529 04/07/15 1048  WBC 7.5 7.0 8.5  HGB 7.5* 8.4* 10.6*  HCT 22.4* 25.5* 31.4*  PLT 261 255 222   BMET  Recent Labs  04/06/15 1247 04/07/15 0529  NA 141 141  K 4.0 3.8  CL 111 108  CO2 23 24  GLUCOSE 103* 109*  BUN 20 20  CREATININE 0.79 0.79  CALCIUM 8.5* 8.1*   LFT  Recent Labs  04/06/15 1250 04/07/15 0529  PROT 5.7* 5.6*  ALBUMIN 3.5 3.4*  AST 22  20  ALT 15 16  ALKPHOS 44 37*  BILITOT 0.4 0.4  BILIDIR 0.1  --   IBILI 0.3  --    PT/INR No results for input(s): LABPROT, INR in the last 72 hours. Hepatitis Panel No results for input(s): HEPBSAG, HCVAB, HEPAIGM, HEPBIGM in the last 72 hours.  Studies/Results: Ct Angio Chest Pe W/cm &/or Wo Cm  04/06/2015  CLINICAL DATA:  Sudden onset  of chest pain, shortness of breath, cough EXAM: CT ANGIOGRAPHY CHEST WITH CONTRAST TECHNIQUE: Multidetector CT imaging of the chest was performed using the standard protocol during bolus administration of intravenous contrast. Multiplanar CT image reconstructions and MIPs were obtained to evaluate the vascular anatomy. CONTRAST:  100 cc Isovue COMPARISON:  Chest x-ray 04/06/2015 FINDINGS: Mediastinum/Lymph Nodes: 2 leads cardiac pacemaker again noted. The patient is status post median sternotomy. Heart size within normal limits. No pericardial effusion. The study is of excellent technical quality. No pulmonary embolus is noted. No aortic aneurysm. Atherosclerotic calcifications of thoracic aorta. No mediastinal hematoma or adenopathy. There is no hilar adenopathy. Lungs/Pleura: No pleural thickening or pleural effusion. Mild emphysematous changes especially in upper lobes. Small emphysematous bullae are noted in right lower lobe. No infiltrate or pulmonary edema. No focal consolidation. There is no pneumothorax. Bilateral apical scarring. Mild hyperinflation. Upper abdomen: The visualized upper abdomen shows a probable cyst in right hepatic dome measures 1.6 cm. No adrenal gland mass is noted. Musculoskeletal: No destructive rib lesions are noted. Sagittal images of the spine shows no destructive bony lesions. Alignment, disc spaces and vertebral body heights are preserved. Review of the MIP images confirms the above findings. IMPRESSION: 1. No pulmonary embolus. 2. Dual lead cardiac pacemaker in place. 3. No acute infiltrate or pulmonary edema. Mild emphysematous changes. 4. No mediastinal hematoma or adenopathy. Electronically Signed   By: Lahoma Crocker M.D.   On: 04/06/2015 16:42   Dg Chest Portable 1 View  04/06/2015  CLINICAL DATA:  Anterior chest pain with shortness of breath, off and on for a long time, worse this morning. EXAM: PORTABLE CHEST 1 VIEW COMPARISON:  Chest x-ray dated 03/15/2015. FINDINGS: Lungs  are again hyperexpanded indicating COPD. Lungs appear stable. No evidence of pneumonia. No pleural effusion. No pneumothorax seen. Heart size is upper normal, perhaps mild cardiomegaly, stable. Overall cardiomediastinal silhouette is stable in size and configuration. Left chest wall pacemaker/ AICD stable in position. Median sternotomy wires appear intact and stable in alignment. No acute osseous abnormality seen. IMPRESSION: 1. Hyperexpanded lungs indicating COPD/emphysema. 2. No acute findings.  No evidence of pneumonia. Electronically Signed   By: Franki Cabot M.D.   On: 04/06/2015 12:46    Assessment;  Patient is elderly female with multiple medical problems who presents with profound weakness and noted to have hemoglobin of 7.5 g. Hemoglobin 3 weeks ago was 13.5. No history of melena or rectal bleeding but accurate history is not possible. She is on low-dose aspirin. She complains of chest pain and frequent burping. He was also noted to have abdominal tenderness on initial presentation. She had normal EGD in October 2013 and colonoscopy revealed few left-sided AV malformation without stigmata of bleed. Anemia appears to be due to recent acute or subacute GI bleed possibly from upper source given her symptoms. She will benefit from diagnostic EGD.  Recommendations;  Diagnostic esophagogastroduodenoscopy in a.m. Patient as well as two of her children who are at bedside and her husband are all in agreement..   LOS: 1 day   REHMAN,NAJEEB U  04/07/2015, 5:14 PM

## 2015-04-08 ENCOUNTER — Encounter (HOSPITAL_COMMUNITY)
Admission: EM | Disposition: A | Discharge status: Home / Self Care | Payer: Self-pay | Source: Home / Self Care | Attending: Internal Medicine

## 2015-04-08 DIAGNOSIS — D649 Anemia, unspecified: Secondary | ICD-10-CM | POA: Diagnosis not present

## 2015-04-08 DIAGNOSIS — K449 Diaphragmatic hernia without obstruction or gangrene: Secondary | ICD-10-CM | POA: Diagnosis not present

## 2015-04-08 DIAGNOSIS — K3189 Other diseases of stomach and duodenum: Secondary | ICD-10-CM | POA: Diagnosis not present

## 2015-04-08 DIAGNOSIS — K922 Gastrointestinal hemorrhage, unspecified: Secondary | ICD-10-CM | POA: Diagnosis not present

## 2015-04-08 DIAGNOSIS — D500 Iron deficiency anemia secondary to blood loss (chronic): Secondary | ICD-10-CM | POA: Diagnosis not present

## 2015-04-08 HISTORY — PX: ESOPHAGOGASTRODUODENOSCOPY: SHX5428

## 2015-04-08 LAB — TYPE AND SCREEN
ABO/RH(D): O NEG
Antibody Screen: NEGATIVE
Unit division: 0
Unit division: 0

## 2015-04-08 LAB — BASIC METABOLIC PANEL
Anion gap: 7 (ref 5–15)
BUN: 16 mg/dL (ref 6–20)
CO2: 26 mmol/L (ref 22–32)
Calcium: 7.8 mg/dL — ABNORMAL LOW (ref 8.9–10.3)
Chloride: 107 mmol/L (ref 101–111)
Creatinine, Ser: 0.79 mg/dL (ref 0.44–1.00)
GFR calc Af Amer: >60 mL/min (ref >60)
GFR calc non Af Amer: >60 mL/min (ref >60)
Glucose, Bld: 102 mg/dL — ABNORMAL HIGH (ref 65–99)
Potassium: 3.6 mmol/L (ref 3.5–5.1)
Sodium: 140 mmol/L (ref 135–145)

## 2015-04-08 LAB — CBC
HCT: 28.3 % — ABNORMAL LOW (ref 36.0–46.0)
Hemoglobin: 9.6 g/dL — ABNORMAL LOW (ref 12.0–15.0)
MCH: 29.4 pg (ref 26.0–34.0)
MCHC: 33.9 g/dL (ref 30.0–36.0)
MCV: 86.8 fL (ref 78.0–100.0)
Platelets: 209 10*3/uL (ref 150–400)
RBC: 3.26 MIL/uL — ABNORMAL LOW (ref 3.87–5.11)
RDW: 14.7 % (ref 11.5–15.5)
WBC: 10.4 10*3/uL (ref 4.0–10.5)

## 2015-04-08 SURGERY — EGD (ESOPHAGOGASTRODUODENOSCOPY)
Anesthesia: Moderate Sedation

## 2015-04-08 MED ORDER — MEPERIDINE HCL 50 MG/ML IJ SOLN
INTRAMUSCULAR | Status: DC | PRN
Start: 2015-04-08 — End: 2015-04-08
  Administered 2015-04-08: 20 mg via INTRAVENOUS

## 2015-04-08 MED ORDER — BUTAMBEN-TETRACAINE-BENZOCAINE 2-2-14 % EX AERO
INHALATION_SPRAY | CUTANEOUS | Status: DC | PRN
  Administered 2015-04-08 (×2): 1 via TOPICAL

## 2015-04-08 MED ORDER — MEPERIDINE HCL 50 MG/ML IJ SOLN
INTRAMUSCULAR | Status: AC
  Filled 2015-04-08: qty 1

## 2015-04-08 MED ORDER — FLUMAZENIL 0.5 MG/5ML IV SOLN
INTRAVENOUS | Status: AC
  Filled 2015-04-08: qty 5

## 2015-04-08 MED ORDER — ATROPINE SULFATE 1 MG/ML IJ SOLN
INTRAMUSCULAR | Status: AC
  Filled 2015-04-08: qty 1

## 2015-04-08 MED ORDER — PANTOPRAZOLE SODIUM 40 MG PO TBEC
40.0000 mg | DELAYED_RELEASE_TABLET | Freq: Every day | ORAL | Status: DC
Start: 2015-04-08 — End: 2015-04-09
  Administered 2015-04-08 – 2015-04-09 (×2): 40 mg via ORAL
  Filled 2015-04-08 (×3): qty 1

## 2015-04-08 MED ORDER — EPINEPHRINE HCL 0.1 MG/ML IJ SOSY
PREFILLED_SYRINGE | INTRAMUSCULAR | Status: DC
Start: 2015-04-08 — End: 2015-04-08
  Filled 2015-04-08: qty 10

## 2015-04-08 MED ORDER — MIDAZOLAM HCL 5 MG/5ML IJ SOLN
INTRAMUSCULAR | Status: AC
  Filled 2015-04-08: qty 10

## 2015-04-08 MED ORDER — NALOXONE HCL 0.4 MG/ML IJ SOLN
INTRAMUSCULAR | Status: AC
  Filled 2015-04-08: qty 1

## 2015-04-08 MED ORDER — MIDAZOLAM HCL 5 MG/5ML IJ SOLN
INTRAMUSCULAR | Status: DC | PRN
  Administered 2015-04-08: 1 mg via INTRAVENOUS

## 2015-04-08 NOTE — Care Management Obs Status (Addendum)
[facility] NOTIFICATION   Patient Details  Name: [name] MRN: [id number] Date of Birth: Jul 20, 1925   Medicare Observation Status Notification Given:  Yes CM witnessed family at bedside her husband to sign form.  Patient not able to sign for self.  C44 given    Briant Sites, RN 04/08/2015, 3:59 PM

## 2015-04-08 NOTE — Progress Notes (Signed)
At approximately 2200 patient was noted in hallway arguing with her son.  He was trying to get the patient back in the room and in bed.  Patient was confused more than her baseline per the son. I attempted to reorient patient to her surroundings and she was not receptive.  I attempted to assist her back to bed and she attempted to hit me with her cane.  I was able to grab the cane and take it away from her with the assistance of her son.  Patient refused to go back in her room and began trying pulling her telemetry off and remove her IV. Security was called and she was placed back in bed by staff and restraints were initiated per MD order.  Dr. Marin Comment notified of all the actions above and he gave orders that were placed in [facility] and carried out.  Pt's son called his sister [name] to come to the hospital in hopes that she could calm the patient down.  Debbie arrived and was unable to reorient patient to the situation.  It was decided that it was best to leave the patient restrained for her safety and [name] went to the waiting room.  Patient was checked on frequently and assessed for removing the restraints.  At approximately 0145 patient was noted to be calm with Debbie at the bedside and the restraints were removed. Patient in bed asleep with even and unlabored respirations at this time. Nursing staff to continue to monitor

## 2015-04-08 NOTE — Op Note (Signed)
[facility] Patient Name: [name] Procedure Date: 04/08/2015 8:04 AM MRN: [id number] Date of Birth: [birth year] Attending MD: Hildred Laser , MD CSN: [id number] Age: 82 Admit Type: Inpatient Procedure:                Upper GI endoscopy Indications:              Iron deficiency anemia secondary to chronic blood                            loss, Recent gastrointestinal bleeding Providers:                Hildred Laser, MD, Nelda Severe, RN, Isabella Stalling, Technician Referring MD:             Niel Hummer. MD Medicines:                Cetacaine spray, Meperidine 20 mg IV, Midazolam 1                            mg IV Complications:            No immediate complications. Estimated Blood Loss:     Estimated blood loss: none. Procedure:                Pre-Anesthesia Assessment:                           - Prior to the procedure, a History and Physical                            was performed, and patient medications and                            allergies were reviewed. The patient's tolerance of                            previous anesthesia was also reviewed. The risks                            and benefits of the procedure and the sedation                            options and risks were discussed with the patient.                            All questions were answered, and informed consent                            was obtained. Prior Anticoagulants: The patient                            last took aspirin 3 days prior to the procedure.  ASA Grade Assessment: III - A patient with severe                            systemic disease. After reviewing the risks and                            benefits, the patient was deemed in satisfactory                            condition to undergo the procedure.                           After obtaining informed consent, the endoscope was                            passed under direct  vision. Throughout the                            procedure, the patient's blood pressure, pulse, and                            oxygen saturations were monitored continuously. The                            [id number]) was introduced through the                            mouth, and advanced to the second part of duodenum.                            The upper GI endoscopy was accomplished without                            difficulty. The patient tolerated the procedure                            well. Scope In: 8:19:15 AM Scope Out: 8:23:37 AM Total Procedure Duration: 0 hours 4 minutes 22 seconds  Findings:      The examined esophagus was normal.      The Z-line was regular and was found 39 cm from the incisors.      A 2 cm hiatal hernia was present.      Diffuse atrophic mucosa was found in the gastric body.      The cardia, gastric fundus, gastric antrum, prepyloric region of the       stomach and pylorus were normal.      The duodenal bulb and second portion of the duodenum were normal.      The hypopharynx was normal. Impression:               - Normal esophagus.                           - Z-line regular, 39 cm from the incisors.                           -  2 cm hiatal hernia.                           - Gastric mucosal atrophy.                           - Normal cardia, gastric fundus, antrum, prepyloric                            region of the stomach and pylorus.                           - Normal duodenal bulb and second portion of the                            duodenum.                           - Normal hypopharynx.                           - No specimens collected.                           Comment: No bleeding source identified in upper GI                            tract. She also bled from colon. No further workup                            unless bleeding continues. Moderate Sedation:      Moderate (conscious) sedation was administered by the endoscopy  nurse       and supervised by the endoscopist. The following parameters were       monitored: oxygen saturation, heart rate, blood pressure, CO2       capnography and response to care. Total physician intraservice time was       7 minutes. Recommendation:           - Return patient to hospital ward for ongoing care.                           - Cardiac diet today.                           - change pantoprazole to oral route at a dose of 40                            mg by mouth daily.                           - Continue to monitor H&H over the next 24 hours.                           - Continue present medications. Procedure Code(s):        --- Professional ---  [id number], Esophagogastroduodenoscopy, flexible,                            transoral; diagnostic, including collection of                            specimen(s) by brushing or washing, when performed                            (separate procedure) Diagnosis Code(s):        --- Professional ---                           K44.9, Diaphragmatic hernia without obstruction or                            gangrene                           K31.89, Other diseases of stomach and duodenum                           D50.0, Iron deficiency anemia secondary to blood                            loss (chronic)                           K92.2, Gastrointestinal hemorrhage, unspecified CPT copyright 2016 [employer]. All rights reserved. The codes documented in this report are preliminary and upon coder review may  be revised to meet current compliance requirements. Hildred Laser, MD Hildred Laser, MD 04/08/2015 8:44:59 AM This report has been signed electronically. Number of Addenda: 0

## 2015-04-08 NOTE — Progress Notes (Signed)
[facility] PROGRESS NOTE  [name] [id number] DOB: Jan 04, 1926 DOA: 04/06/2015 PCP: Purvis Kilts, MD  Assessment/Plan: 82 year old female who  has a past medical history of CAD (coronary artery disease); Cerebrovascular disease; Syncope; Tobacco abuse; Orthostatic hypotension; Diverticulosis; Osteopenia, came to the ED with complaints of chest pain. Patient also had shortness of breath. Patient has been feeling extremely weak with dizziness going on for past 1 month. She also noticed black colored stool. Hb on admission at 7. 3 weeks ago at 13.   1-Chest pain; likely demand related to low hb.  Chest pain has resolved. Hb at 10. Troponin times 2 negative.  Chest pain free this am.   2-Acute blood loss anemia, GI bleed; Iron deficiency anemia;  Occult blood positive, report of black stool.  Received 2 units of PRBC. Continue with protonix.  Underwent endoscopy 3-31 which was negative for source of bleeding.   CAD status post CABG Continue Imdur, hold aspirin at this time. Continue statin  Dizziness; Chronic problem, that likely got worse in setting of anemia; patient is feeling better. Further work up by PCP./   Encephalopathy; got confuse , agitated overnight. Suspect is related to medications, vs hospital delirium   Code Status: Full code.  Family Communication: son at bedside.  Disposition Plan: remain inpatient for evaluation of anemia.    Consultants:  GI  Procedures:  none  Antibiotics:  none  HPI/Subjective: Events from overnight notice. Patient became agitated.  This morning she is alert, not agitated, she recognized her husband.   Objective: Filed Vitals:   04/08/15 0840 04/08/15 0845  BP: 122/67 120/70  Pulse: 58 59  Temp:    Resp: 14 13   No intake or output data in the 24 hours ending 04/08/15 1421 Filed Weights   04/06/15 1231 04/06/15 2135  Weight: 54.749 kg (120 lb 11.2 oz) 56 kg (123 lb 7.3 oz)    Exam:   General:   NAD  Cardiovascular: S 1, S 2 RRR  Respiratory: CTA  Abdomen: BS present, soft, nt  Musculoskeletal: no edema  Data Reviewed: Basic Metabolic Panel:  Recent Labs Lab 04/06/15 1247 04/07/15 0529 04/08/15 0614  NA 141 141 140  K 4.0 3.8 3.6  CL 111 108 107  CO2 23 24 26   GLUCOSE 103* 109* 102*  BUN 20 20 16   CREATININE 0.79 0.79 0.79  CALCIUM 8.5* 8.1* 7.8*   Liver Function Tests:  Recent Labs Lab 04/06/15 1250 04/07/15 0529  AST 22 20  ALT 15 16  ALKPHOS 44 37*  BILITOT 0.4 0.4  PROT 5.7* 5.6*  ALBUMIN 3.5 3.4*   No results for input(s): LIPASE, AMYLASE in the last 168 hours. No results for input(s): AMMONIA in the last 168 hours. CBC:  Recent Labs Lab 04/06/15 1247 04/07/15 0529 04/07/15 1048 04/08/15 0614  WBC 7.5 7.0 8.5 10.4  NEUTROABS 5.8  --   --   --   HGB 7.5* 8.4* 10.6* 9.6*  HCT 22.4* 25.5* 31.4* 28.3*  MCV 91.8 90.4 87.0 86.8  PLT 261 255 222 209   Cardiac Enzymes:  Recent Labs Lab 04/06/15 1247 04/06/15 2022 04/07/15 1044 04/07/15 1620 04/07/15 2239  TROPONINI <0.03 <0.03 <0.03 <0.03 <0.03   BNP (last 3 results)  Recent Labs  04/06/15 1250  BNP 211.0*    ProBNP (last 3 results) No results for input(s): PROBNP in the last 8760 hours.  CBG: No results for input(s): GLUCAP in the last 168 hours.  No results found  for this or any previous visit (from the past 240 hour(s)).   Studies: Ct Angio Chest Pe W/cm &/or Wo Cm  04/06/2015  CLINICAL DATA:  Sudden onset of chest pain, shortness of breath, cough EXAM: CT ANGIOGRAPHY CHEST WITH CONTRAST TECHNIQUE: Multidetector CT imaging of the chest was performed using the standard protocol during bolus administration of intravenous contrast. Multiplanar CT image reconstructions and MIPs were obtained to evaluate the vascular anatomy. CONTRAST:  100 cc Isovue COMPARISON:  Chest x-ray 04/06/2015 FINDINGS: Mediastinum/Lymph Nodes: 2 leads cardiac pacemaker again noted. The patient is  status post median sternotomy. Heart size within normal limits. No pericardial effusion. The study is of excellent technical quality. No pulmonary embolus is noted. No aortic aneurysm. Atherosclerotic calcifications of thoracic aorta. No mediastinal hematoma or adenopathy. There is no hilar adenopathy. Lungs/Pleura: No pleural thickening or pleural effusion. Mild emphysematous changes especially in upper lobes. Small emphysematous bullae are noted in right lower lobe. No infiltrate or pulmonary edema. No focal consolidation. There is no pneumothorax. Bilateral apical scarring. Mild hyperinflation. Upper abdomen: The visualized upper abdomen shows a probable cyst in right hepatic dome measures 1.6 cm. No adrenal gland mass is noted. Musculoskeletal: No destructive rib lesions are noted. Sagittal images of the spine shows no destructive bony lesions. Alignment, disc spaces and vertebral body heights are preserved. Review of the MIP images confirms the above findings. IMPRESSION: 1. No pulmonary embolus. 2. Dual lead cardiac pacemaker in place. 3. No acute infiltrate or pulmonary edema. Mild emphysematous changes. 4. No mediastinal hematoma or adenopathy. Electronically Signed   By: Lahoma Crocker M.D.   On: 04/06/2015 16:42    Scheduled Meds: . atorvastatin  10 mg Oral q1800  . donepezil  10 mg Oral QHS  . flumazenil      . isosorbide mononitrate  30 mg Oral Daily  . meperidine      . midazolam      . pantoprazole  40 mg Oral Daily   Continuous Infusions: . sodium chloride 10 mL/hr at 04/06/15 2330    Active Problems:   CAD (coronary artery disease)   Anemia   Chest pain   Lower GI bleed    Time spent: 25 minutes.     Niel Hummer A  [facility] Pager [phone]. If 7PM-7AM, please contact night-coverage at [email or web address], password [facility] 04/08/2015, 2:21 PM  LOS: 2 days

## 2015-04-09 DIAGNOSIS — K922 Gastrointestinal hemorrhage, unspecified: Secondary | ICD-10-CM | POA: Diagnosis not present

## 2015-04-09 DIAGNOSIS — D649 Anemia, unspecified: Secondary | ICD-10-CM | POA: Diagnosis not present

## 2015-04-09 LAB — BASIC METABOLIC PANEL
Anion gap: 6 (ref 5–15)
BUN: 11 mg/dL (ref 6–20)
CALCIUM: 7.7 mg/dL — AB (ref 8.9–10.3)
CO2: 26 mmol/L (ref 22–32)
Chloride: 110 mmol/L (ref 101–111)
Creatinine, Ser: 0.7 mg/dL (ref 0.44–1.00)
GFR calc Af Amer: >60 mL/min (ref >60)
Glucose, Bld: 95 mg/dL (ref 65–99)
POTASSIUM: 3.5 mmol/L (ref 3.5–5.1)
SODIUM: 142 mmol/L (ref 135–145)

## 2015-04-09 LAB — CBC
HCT: 27.4 % — ABNORMAL LOW (ref 36.0–46.0)
Hemoglobin: 9.2 g/dL — ABNORMAL LOW (ref 12.0–15.0)
MCH: 29.3 pg (ref 26.0–34.0)
MCHC: 33.6 g/dL (ref 30.0–36.0)
MCV: 87.3 fL (ref 78.0–100.0)
PLATELETS: 215 10*3/uL (ref 150–400)
RBC: 3.14 MIL/uL — AB (ref 3.87–5.11)
RDW: 14.6 % (ref 11.5–15.5)
WBC: 6.4 10*3/uL (ref 4.0–10.5)

## 2015-04-09 MED ORDER — FERROUS SULFATE 325 (65 FE) MG PO TABS
325.0000 mg | ORAL_TABLET | Freq: Two times a day (BID) | ORAL | Status: DC
  Administered 2015-04-09: 325 mg via ORAL
  Filled 2015-04-09: qty 1

## 2015-04-09 MED ORDER — SIMETHICONE 80 MG PO CHEW: 80.0000 mg | CHEWABLE_TABLET | Freq: Four times a day (QID) | ORAL | Status: DC | PRN

## 2015-04-09 MED ORDER — POLYETHYLENE GLYCOL 3350 17 G PO PACK: 17.0000 g | PACK | Freq: Every day | ORAL | Status: DC

## 2015-04-09 MED ORDER — FERROUS SULFATE 325 (65 FE) MG PO TABS
325.0000 mg | ORAL_TABLET | Freq: Three times a day (TID) | ORAL | Status: DC
Start: 2015-04-09 — End: 2015-04-09

## 2015-04-09 MED ORDER — FERROUS SULFATE 325 (65 FE) MG PO TABS: 325.0000 mg | ORAL_TABLET | Freq: Two times a day (BID) | ORAL | Status: DC

## 2015-04-09 MED ORDER — OMEPRAZOLE 20 MG PO CPDR: 40.0000 mg | DELAYED_RELEASE_CAPSULE | Freq: Every day | ORAL | Status: AC

## 2015-04-09 NOTE — Discharge Summary (Signed)
Physician Discharge Summary  [name] [id number] DOB: [birth year] DOA: 04/06/2015  PCP: Purvis Kilts, MD  Admit date: 04/06/2015 Discharge date: 04/09/2015  Time spent: 35 minutes  Recommendations for Outpatient Follow-up:  Needs CBC to follow hb.  Follow up with PCP for chronic dizziness./   Discharge Diagnoses:    Anemia, iron deficiency , acute blood loss anemia   GI bleed.    Acute encephalopathy    CAD (coronary artery disease)   Chest pain   Lower GI bleed   Discharge Condition: stable  Diet recommendation: Heart healthy   Filed Weights   04/06/15 1231 04/06/15 2135  Weight: 54.749 kg (120 lb 11.2 oz) 56 kg (123 lb 7.3 oz)    History of present illness:  82 year old female who  has a past medical history of CAD (coronary artery disease); Cerebrovascular disease; Syncope; Tobacco abuse; Orthostatic hypotension; Diverticulosis; Osteopenia; GERD (gastroesophageal reflux disease); Hypertension; Mental disorder; Anemia; Sinus bradycardia; Frequent PVCs; Family history of anesthesia complication; High cholesterol; Asthma; Chronic bronchitis ([place]); Pneumonia; Headache(784.0); Arthritis; Chronic back pain greater than 3 months duration; Frequent UTI; Dementia; Dizziness; COPD (chronic obstructive pulmonary disease) ([place]); and Stroke ([place]). Today came to the ED with complaints of chest pain. Patient also had shortness of breath. Patient has been feeling extremely weak with dizziness going on for past 1 month. She also noticed black colored stool. In the ED lab look revealed anemia with him in some 0.5. Her previous him again on 03/15/2015 was 13.7. She denies nausea vomiting or diarrhea. Denies fever, but has been comparing of dysuria. UA was negative in the ED.  Hospital Course:   1-Chest pain; likely demand related to low hb.  Chest pain has resolved. Hb at 10. Troponin times 2 negative.  Chest pain free this am.   2-Acute blood loss anemia, GI bleed; Iron  deficiency anemia;  Occult blood positive, report of black stool.  Received 2 units of PRBC. Continue with protonix.  Underwent endoscopy 3-31 which was negative for source of bleeding.  Hb has remain stable at 9. Will prescribe ferrous sulfate.  No further evaluation, for colonoscopy unless active bleeding.   CAD status post CABG Continue Imdur, hold aspirin at this time. Continue statin  Dizziness; Chronic problem, that likely got worse in setting of anemia; patient is feeling better. Further work up by PCP./  PT consulted. HH PT arrange./   Encephalopathy; got confuse , agitated overnight while hospitalized.  Suspect is related to medications, vs hospital delirium  She is back to baseline. Plan to discharge home today   Procedures:  Endoscopy   Consultations:  Dr Dereck Leep   Discharge Exam: Filed Vitals:   04/08/15 2211 04/09/15 0707  BP: 129/80 135/78  Pulse: 73 98  Temp: 98.6 F (37 C) 98.5 F (36.9 C)  Resp: 16 16    General: Alert in no acute distress Cardiovascular: S 1, S 2 RRR Respiratory: CTA  Discharge Instructions   Discharge Instructions    Diet - low sodium heart healthy    Complete by:  As directed      Increase activity slowly    Complete by:  As directed           Discharge Medication List as of 04/09/2015  2:32 PM    START taking these medications   Details  ferrous sulfate 325 (65 FE) MG tablet Take 1 tablet (325 mg total) by mouth 2 (two) times daily with a meal., Starting 04/09/2015, Until Discontinued, Print  polyethylene glycol (MIRALAX / GLYCOLAX) packet Take 17 g by mouth daily., Starting 04/09/2015, Until Discontinued, Print    simethicone (MYLICON) 80 MG chewable tablet Chew 1 tablet (80 mg total) by mouth every 6 (six) hours as needed for flatulence., Starting 04/09/2015, Until Discontinued, Print      CONTINUE these medications which have CHANGED   Details  omeprazole (PRILOSEC) 20 MG capsule Take 2 capsules (40 mg total) by  mouth daily., Starting 04/09/2015, Until Discontinued, Print      CONTINUE these medications which have NOT CHANGED   Details  atorvastatin (LIPITOR) 10 MG tablet TAKE ONE TABLET ONCE DAILY, Normal    Calcium-Vitamin D (CALTRATE 600 PLUS-VIT D PO) Take 1 tablet by mouth at bedtime. , Until Discontinued, Historical Med    donepezil (ARICEPT) 10 MG tablet Take 10 mg by mouth daily. , Until Discontinued, Historical Med    isosorbide mononitrate (IMDUR) 30 MG 24 hr tablet Take 30 mg by mouth daily. , Starting 02/07/2015, Until Discontinued, Historical Med    !! Multiple Vitamin (MULTIVITAMIN WITH MINERALS) TABS tablet Take 1 tablet by mouth daily., Until Discontinued, Historical Med    !! Multiple Vitamins-Minerals (PRESERVISION AREDS PO) Take 1 capsule by mouth 2 (two) times daily., Until Discontinued, Historical Med    Omega-3 Fatty Acids (FISH OIL) 1200 MG CAPS Take 1,200 mg by mouth at bedtime. , Until Discontinued, Historical Med    zoledronic acid (RECLAST) 5 MG/100ML SOLN injection Inject 5 mg into the vein once., Historical Med    acetaminophen (TYLENOL) 325 MG tablet Take 650 mg by mouth every 6 (six) hours as needed for mild pain. For pain, Until Discontinued, Historical Med    NITROSTAT 0.4 MG SL tablet PLACE 1 TABLET UNDER THE TONGUE EVERY 5 MINUTES AS NEEDED FOR CHEST PAIN.DO NOT EXCEED 3 DOSES IN 24 HOURS, Normal     !! - Potential duplicate medications found. Please discuss with provider.    STOP taking these medications     aspirin EC 81 MG tablet        Allergies  Allergen Reactions  . Cephalexin Hives  . Contrast Media [Iodinated Diagnostic Agents]     Unknown  . Iohexol     Urticaria, emesis, numbness during IVP performed at [facility] years ago  . Penicillins Hives      . Shellfish Allergy Other (See Comments)    Unknown  . Sulfa Antibiotics Hives   Follow-up Information    Follow up with Purvis Kilts, MD In 3 days.   Specialty:  Family  Medicine   Contact information:   [address] [place] [id number] [phone]        The results of significant diagnostics from this hospitalization (including imaging, microbiology, ancillary and laboratory) are listed below for reference.    Significant Diagnostic Studies: Ct Angio Chest Pe W/cm &/or Wo Cm  04/06/2015  CLINICAL DATA:  Sudden onset of chest pain, shortness of breath, cough EXAM: CT ANGIOGRAPHY CHEST WITH CONTRAST TECHNIQUE: Multidetector CT imaging of the chest was performed using the standard protocol during bolus administration of intravenous contrast. Multiplanar CT image reconstructions and MIPs were obtained to evaluate the vascular anatomy. CONTRAST:  100 cc Isovue COMPARISON:  Chest x-ray 04/06/2015 FINDINGS: Mediastinum/Lymph Nodes: 2 leads cardiac pacemaker again noted. The patient is status post median sternotomy. Heart size within normal limits. No pericardial effusion. The study is of excellent technical quality. No pulmonary embolus is noted. No aortic aneurysm. Atherosclerotic calcifications of thoracic  aorta. No mediastinal hematoma or adenopathy. There is no hilar adenopathy. Lungs/Pleura: No pleural thickening or pleural effusion. Mild emphysematous changes especially in upper lobes. Small emphysematous bullae are noted in right lower lobe. No infiltrate or pulmonary edema. No focal consolidation. There is no pneumothorax. Bilateral apical scarring. Mild hyperinflation. Upper abdomen: The visualized upper abdomen shows a probable cyst in right hepatic dome measures 1.6 cm. No adrenal gland mass is noted. Musculoskeletal: No destructive rib lesions are noted. Sagittal images of the spine shows no destructive bony lesions. Alignment, disc spaces and vertebral body heights are preserved. Review of the MIP images confirms the above findings. IMPRESSION: 1. No pulmonary embolus. 2. Dual lead cardiac pacemaker in place. 3. No acute infiltrate or pulmonary edema.  Mild emphysematous changes. 4. No mediastinal hematoma or adenopathy. Electronically Signed   By: Lahoma Crocker M.D.   On: 04/06/2015 16:42   Dg Chest Portable 1 View  04/06/2015  CLINICAL DATA:  Anterior chest pain with shortness of breath, off and on for a long time, worse this morning. EXAM: PORTABLE CHEST 1 VIEW COMPARISON:  Chest x-ray dated 03/15/2015. FINDINGS: Lungs are again hyperexpanded indicating COPD. Lungs appear stable. No evidence of pneumonia. No pleural effusion. No pneumothorax seen. Heart size is upper normal, perhaps mild cardiomegaly, stable. Overall cardiomediastinal silhouette is stable in size and configuration. Left chest wall pacemaker/ AICD stable in position. Median sternotomy wires appear intact and stable in alignment. No acute osseous abnormality seen. IMPRESSION: 1. Hyperexpanded lungs indicating COPD/emphysema. 2. No acute findings.  No evidence of pneumonia. Electronically Signed   By: Franki Cabot M.D.   On: 04/06/2015 12:46   Dg Chest Port 1 View  03/15/2015  CLINICAL DATA:  Chest pain last night with shortness of breath. Prior smoker. EXAM: PORTABLE CHEST 1 VIEW COMPARISON:  10/14/2013 FINDINGS: The lungs are hyperinflated likely secondary to COPD. There is no focal parenchymal opacity. There is no pleural effusion or pneumothorax. The heart size is mildly enlarged. There is evidence of prior CABG. There is a dual lead AICD. The osseous structures are unremarkable. IMPRESSION: No active disease. Electronically Signed   By: Kathreen Devoid   On: 03/15/2015 10:35    Microbiology: No results found for this or any previous visit (from the past 240 hour(s)).   Labs: Basic Metabolic Panel:  Recent Labs Lab 04/06/15 1247 04/07/15 0529 04/08/15 0614 04/09/15 0607  NA 141 141 140 142  K 4.0 3.8 3.6 3.5  CL 111 108 107 110  CO2 23 24 26 26   GLUCOSE 103* 109* 102* 95  BUN 20 20 16 11   CREATININE 0.79 0.79 0.79 0.70  CALCIUM 8.5* 8.1* 7.8* 7.7*   Liver Function  Tests:  Recent Labs Lab 04/06/15 1250 04/07/15 0529  AST 22 20  ALT 15 16  ALKPHOS 44 37*  BILITOT 0.4 0.4  PROT 5.7* 5.6*  ALBUMIN 3.5 3.4*   No results for input(s): LIPASE, AMYLASE in the last 168 hours. No results for input(s): AMMONIA in the last 168 hours. CBC:  Recent Labs Lab 04/06/15 1247 04/07/15 0529 04/07/15 1048 04/08/15 0614 04/09/15 0607  WBC 7.5 7.0 8.5 10.4 6.4  NEUTROABS 5.8  --   --   --   --   HGB 7.5* 8.4* 10.6* 9.6* 9.2*  HCT 22.4* 25.5* 31.4* 28.3* 27.4*  MCV 91.8 90.4 87.0 86.8 87.3  PLT 261 255 222 209 215   Cardiac Enzymes:  Recent Labs Lab 04/06/15 1247 04/06/15 2022 04/07/15 1044 04/07/15  1620 04/07/15 2239  TROPONINI <0.03 <0.03 <0.03 <0.03 <0.03   BNP: BNP (last 3 results)  Recent Labs  04/06/15 1250  BNP 211.0*    ProBNP (last 3 results) No results for input(s): PROBNP in the last 8760 hours.  CBG: No results for input(s): GLUCAP in the last 168 hours.     Signed:  Elmarie Shiley MD.  [facility] 04/09/2015, 4:10 PM

## 2015-04-09 NOTE — Evaluation (Signed)
Physical Therapy Evaluation Patient Details Name: [name] MRN: [id number] DOB: 07/28/1925 Today's Date: 04/09/2015   History of Present Illness  82 year old female who  has a past medical history of CAD (coronary artery disease); Cerebrovascular disease; Syncope; Tobacco abuse; Orthostatic hypotension; Diverticulosis; Osteopenia; GERD (gastroesophageal reflux disease); Hypertension; Mental disorder; Anemia; Sinus bradycardia; Frequent PVCs; Family history of anesthesia complication; High cholesterol; Asthma; Chronic bronchitis ([place]); Pneumonia; Headache(784.0); Arthritis; Chronic back pain greater than 3 months duration; Frequent UTI; Dementia; Dizziness; COPD (chronic obstructive pulmonary disease) ([place]); and Stroke ([place]). Today came to the ED with complaints of chest pain. Patient also had shortness of breath. Patient has been feeling extremely weak with dizziness going on for past 1 month. She also noticed black colored stool. In the ED lab look revealed anemia with him in some 0.5. Her previous him again on 03/15/2015 was 13.7.  Clinical Impression  Patient found supine in bed, very pleasant and willing to work with skilled PT services today. Upon examination, patient is independent with bed mobility however does display significant general muscle weakness today; able to perform sit to stand transfer with Mod(I) and quad cane, and ambulated approximately 66f today however had narrow BOS and tended to drift left and right. Patient limited by fatigue today, reports that she is feeling about 50% compared to her usual self. Patient and her family confirm that her mobility as a whole is generally at baseline right now. O2 and HR  On room air remained WNL today. At this point recommend HHPT to assist patient in return to full function; patient has access to a walker at home and PT recommended that she use this for safety until she becomes a bit stronger working with [facility]. Patient left in bed  with all needs met and all questions/concerns from patient and family addressed.     Follow Up Recommendations Home health PT    Equipment Recommendations  None recommended by PT    Recommendations for Other Services       Precautions / Restrictions Precautions Precautions: Fall Restrictions Weight Bearing Restrictions: No      Mobility  Bed Mobility Overal bed mobility: Independent             General bed mobility comments: supine to sit and sit to supine with independence   Transfers Overall transfer level: Modified independent Equipment used: Quad cane                Ambulation/Gait Ambulation/Gait assistance: Min guard Ambulation Distance (Feet): 70 Feet Assistive device: Quad cane Gait Pattern/deviations: Decreased dorsiflexion - right;Decreased dorsiflexion - left;Scissoring;Drifts right/left;Trunk flexed;Narrow base of support        Stairs            Wheelchair Mobility    Modified Rankin (Stroke Patients Only)       Balance Overall balance assessment: Modified Independent                                           Pertinent Vitals/Pain Pain Assessment: No/denies pain    Home Living Family/patient expects to be discharged to:: Private residence Living Arrangements: Children;Spouse/significant other Available Help at Discharge: Available 24 hours/day Type of Home: House Home Access: Stairs to enter Entrance Stairs-Rails: Right Entrance Stairs-Number of Steps: "a few stairs with one railing" per patient  Home Layout: One level Home Equipment: Walker - 2 wheels;Shower seat;Cane -  quad      Prior Function Level of Independence: Independent with assistive device(s)               Hand Dominance        Extremity/Trunk Assessment   Upper Extremity Assessment: Defer to OT evaluation           Lower Extremity Assessment: Generalized weakness      Cervical / Trunk Assessment: Normal   Communication   Communication: No difficulties  Cognition Arousal/Alertness: Awake/alert Behavior During Therapy: WFL for tasks assessed/performed Overall Cognitive Status: Within Functional Limits for tasks assessed                      General Comments General comments (skin integrity, edema, etc.): some unsteadiness during ambulation however patient able to self-correct/maintain balance with quad cane, reports this is her norm     Exercises        Assessment/Plan    PT Assessment All further PT needs can be met in the next venue of care  PT Diagnosis Difficulty walking;Abnormality of gait;Generalized weakness   PT Problem List Decreased strength;Decreased coordination;Decreased activity tolerance;Decreased balance;Decreased mobility  PT Treatment Interventions     PT Goals (Current goals can be found in the Care Plan section) Acute Rehab PT Goals Patient Stated Goal: to go home  PT Goal Formulation: With patient Time For Goal Achievement: 04/23/15 Potential to Achieve Goals: Good    Frequency     Barriers to discharge        Co-evaluation               End of Session Equipment Utilized During Treatment: Gait belt Activity Tolerance: Patient tolerated treatment well;Patient limited by fatigue Patient left: in bed;with call bell/phone within reach;with bed alarm set;with family/visitor present      Functional Assessment Tool Used: Based on skilled clinical assessment of strength, functional mobility, balance  Functional Limitation: Mobility: Walking and moving around Mobility: Walking and Moving Around Current Status (T7017): At least 40 percent but less than 60 percent impaired, limited or restricted Mobility: Walking and Moving Around Goal Status [phone]): At least 20 percent but less than 40 percent impaired, limited or restricted    Time: 1040-1055 PT Time Calculation (min) (ACUTE ONLY): 15 min   Charges:   PT Evaluation $PT Eval Low  Complexity: 1 Procedure     PT G Codes:   PT G-Codes **NOT FOR INPATIENT CLASS** Functional Assessment Tool Used: Based on skilled clinical assessment of strength, functional mobility, balance  Functional Limitation: Mobility: Walking and moving around Mobility: Walking and Moving Around Current Status (Z0092): At least 40 percent but less than 60 percent impaired, limited or restricted Mobility: Walking and Moving Around Goal Status [phone]): At least 20 percent but less than 40 percent impaired, limited or restricted    Deniece Ree PT, DPT [phone]

## 2015-04-09 NOTE — Progress Notes (Signed)
CM spoke with family regarding [facility] services.  Patient has not received HH and [facility] was decided to be used.  CM faxed paperwork for processing.  Followed up and information was received for review per [facility] at [facility].  Family will be contacted and services to start on 4/3.

## 2015-04-09 NOTE — Progress Notes (Signed)
Pt IV removed, tolerated well.  Reviewed discharge instructions with pt, husband and son at  Bedside.  Answered questions at this time.

## 2015-04-10 ENCOUNTER — Encounter (HOSPITAL_COMMUNITY): Payer: Self-pay | Admitting: Internal Medicine

## 2015-04-10 DIAGNOSIS — Z8673 Personal history of transient ischemic attack (TIA), and cerebral infarction without residual deficits: Secondary | ICD-10-CM | POA: Diagnosis not present

## 2015-04-10 DIAGNOSIS — K219 Gastro-esophageal reflux disease without esophagitis: Secondary | ICD-10-CM | POA: Diagnosis not present

## 2015-04-10 DIAGNOSIS — E785 Hyperlipidemia, unspecified: Secondary | ICD-10-CM | POA: Diagnosis not present

## 2015-04-10 DIAGNOSIS — I2510 Atherosclerotic heart disease of native coronary artery without angina pectoris: Secondary | ICD-10-CM | POA: Diagnosis not present

## 2015-04-10 DIAGNOSIS — I10 Essential (primary) hypertension: Secondary | ICD-10-CM | POA: Diagnosis not present

## 2015-04-10 DIAGNOSIS — I495 Sick sinus syndrome: Secondary | ICD-10-CM | POA: Diagnosis not present

## 2015-04-10 DIAGNOSIS — J449 Chronic obstructive pulmonary disease, unspecified: Secondary | ICD-10-CM | POA: Diagnosis not present

## 2015-04-10 DIAGNOSIS — K922 Gastrointestinal hemorrhage, unspecified: Secondary | ICD-10-CM | POA: Diagnosis not present

## 2015-04-10 DIAGNOSIS — D649 Anemia, unspecified: Secondary | ICD-10-CM | POA: Diagnosis not present

## 2015-04-10 DIAGNOSIS — Z95818 Presence of other cardiac implants and grafts: Secondary | ICD-10-CM | POA: Diagnosis not present

## 2015-04-11 ENCOUNTER — Emergency Department (HOSPITAL_COMMUNITY): Payer: Medicare Other

## 2015-04-11 ENCOUNTER — Inpatient Hospital Stay (HOSPITAL_COMMUNITY)
Admission: EM | Admit: 2015-04-11 | Discharge: 2015-04-13 | DRG: 378 | Disposition: A | Discharge status: Home Health | Payer: Medicare Other | Attending: Internal Medicine | Admitting: Internal Medicine

## 2015-04-11 ENCOUNTER — Inpatient Hospital Stay (HOSPITAL_COMMUNITY): Payer: Medicare Other

## 2015-04-11 ENCOUNTER — Encounter (HOSPITAL_COMMUNITY): Payer: Self-pay | Admitting: Emergency Medicine

## 2015-04-11 DIAGNOSIS — F0390 Unspecified dementia without behavioral disturbance: Secondary | ICD-10-CM | POA: Diagnosis present

## 2015-04-11 DIAGNOSIS — Z888 Allergy status to other drugs, medicaments and biological substances status: Secondary | ICD-10-CM

## 2015-04-11 DIAGNOSIS — D62 Acute posthemorrhagic anemia: Secondary | ICD-10-CM | POA: Diagnosis present

## 2015-04-11 DIAGNOSIS — K922 Gastrointestinal hemorrhage, unspecified: Secondary | ICD-10-CM | POA: Diagnosis not present

## 2015-04-11 DIAGNOSIS — K219 Gastro-esophageal reflux disease without esophagitis: Secondary | ICD-10-CM | POA: Diagnosis not present

## 2015-04-11 DIAGNOSIS — Z951 Presence of aortocoronary bypass graft: Secondary | ICD-10-CM | POA: Diagnosis not present

## 2015-04-11 DIAGNOSIS — G8929 Other chronic pain: Secondary | ICD-10-CM | POA: Diagnosis not present

## 2015-04-11 DIAGNOSIS — J45909 Unspecified asthma, uncomplicated: Secondary | ICD-10-CM | POA: Diagnosis present

## 2015-04-11 DIAGNOSIS — I2510 Atherosclerotic heart disease of native coronary artery without angina pectoris: Secondary | ICD-10-CM | POA: Diagnosis present

## 2015-04-11 DIAGNOSIS — K5730 Diverticulosis of large intestine without perforation or abscess without bleeding: Secondary | ICD-10-CM | POA: Diagnosis not present

## 2015-04-11 DIAGNOSIS — K921 Melena: Secondary | ICD-10-CM | POA: Diagnosis not present

## 2015-04-11 DIAGNOSIS — R0789 Other chest pain: Secondary | ICD-10-CM | POA: Diagnosis not present

## 2015-04-11 DIAGNOSIS — R079 Chest pain, unspecified: Secondary | ICD-10-CM

## 2015-04-11 DIAGNOSIS — E876 Hypokalemia: Secondary | ICD-10-CM | POA: Diagnosis present

## 2015-04-11 DIAGNOSIS — K5520 Angiodysplasia of colon without hemorrhage: Secondary | ICD-10-CM | POA: Diagnosis not present

## 2015-04-11 DIAGNOSIS — Z950 Presence of cardiac pacemaker: Secondary | ICD-10-CM | POA: Diagnosis present

## 2015-04-11 DIAGNOSIS — I10 Essential (primary) hypertension: Secondary | ICD-10-CM | POA: Diagnosis present

## 2015-04-11 DIAGNOSIS — Z91041 Radiographic dye allergy status: Secondary | ICD-10-CM | POA: Diagnosis not present

## 2015-04-11 DIAGNOSIS — Z880 Allergy status to penicillin: Secondary | ICD-10-CM | POA: Diagnosis not present

## 2015-04-11 DIAGNOSIS — J449 Chronic obstructive pulmonary disease, unspecified: Secondary | ICD-10-CM | POA: Diagnosis not present

## 2015-04-11 DIAGNOSIS — Z8673 Personal history of transient ischemic attack (TIA), and cerebral infarction without residual deficits: Secondary | ICD-10-CM | POA: Diagnosis not present

## 2015-04-11 DIAGNOSIS — R0682 Tachypnea, not elsewhere classified: Secondary | ICD-10-CM | POA: Diagnosis not present

## 2015-04-11 DIAGNOSIS — Z87891 Personal history of nicotine dependence: Secondary | ICD-10-CM | POA: Diagnosis not present

## 2015-04-11 DIAGNOSIS — M79661 Pain in right lower leg: Secondary | ICD-10-CM | POA: Diagnosis not present

## 2015-04-11 DIAGNOSIS — R110 Nausea: Secondary | ICD-10-CM | POA: Diagnosis not present

## 2015-04-11 DIAGNOSIS — Z882 Allergy status to sulfonamides status: Secondary | ICD-10-CM | POA: Diagnosis not present

## 2015-04-11 DIAGNOSIS — K625 Hemorrhage of anus and rectum: Secondary | ICD-10-CM | POA: Diagnosis not present

## 2015-04-11 DIAGNOSIS — M79662 Pain in left lower leg: Secondary | ICD-10-CM | POA: Diagnosis not present

## 2015-04-11 LAB — CBC
HCT: 23.3 % — ABNORMAL LOW (ref 36.0–46.0)
HEMATOCRIT: 28.5 % — AB (ref 36.0–46.0)
HEMOGLOBIN: 9.3 g/dL — AB (ref 12.0–15.0)
HEMOGLOBIN: 7.8 g/dL — AB (ref 12.0–15.0)
MCH: 29.2 pg (ref 26.0–34.0)
MCH: 29.4 pg (ref 26.0–34.0)
MCHC: 32.6 g/dL (ref 30.0–36.0)
MCHC: 33.5 g/dL (ref 30.0–36.0)
MCV: 89.3 fL (ref 78.0–100.0)
MCV: 87.9 fL (ref 78.0–100.0)
PLATELETS: 187 10*3/uL (ref 150–400)
Platelets: 209 10*3/uL (ref 150–400)
RBC: 3.19 MIL/uL — ABNORMAL LOW (ref 3.87–5.11)
RBC: 2.65 MIL/uL — ABNORMAL LOW (ref 3.87–5.11)
RDW: 14.5 % (ref 11.5–15.5)
RDW: 14.5 % (ref 11.5–15.5)
WBC: 9.3 10*3/uL (ref 4.0–10.5)
WBC: 7.4 10*3/uL (ref 4.0–10.5)

## 2015-04-11 LAB — CBC WITH DIFFERENTIAL/PLATELET
BASOS ABS: 0 10*3/uL (ref 0.0–0.1)
Basophils Relative: 1 %
EOS ABS: 0.1 10*3/uL (ref 0.0–0.7)
EOS PCT: 1 %
HCT: 26.3 % — ABNORMAL LOW (ref 36.0–46.0)
HEMOGLOBIN: 8.7 g/dL — AB (ref 12.0–15.0)
Lymphocytes Relative: 13 %
Lymphs Abs: 1 10*3/uL (ref 0.7–4.0)
MCH: 29 pg (ref 26.0–34.0)
MCHC: 33.1 g/dL (ref 30.0–36.0)
MCV: 87.7 fL (ref 78.0–100.0)
Monocytes Absolute: 0.8 10*3/uL (ref 0.1–1.0)
Monocytes Relative: 10 %
NEUTROS PCT: 75 %
Neutro Abs: 6.3 10*3/uL (ref 1.7–7.7)
PLATELETS: 216 10*3/uL (ref 150–400)
RBC: 3 MIL/uL — AB (ref 3.87–5.11)
RDW: 14.4 % (ref 11.5–15.5)
WBC: 8.3 10*3/uL (ref 4.0–10.5)

## 2015-04-11 LAB — COMPREHENSIVE METABOLIC PANEL
ALT: 16 U/L (ref 14–54)
AST: 24 U/L (ref 15–41)
Albumin: 3.4 g/dL — ABNORMAL LOW (ref 3.5–5.0)
Alkaline Phosphatase: 38 U/L (ref 38–126)
Anion gap: 7 (ref 5–15)
BUN: 16 mg/dL (ref 6–20)
CHLORIDE: 108 mmol/L (ref 101–111)
CO2: 25 mmol/L (ref 22–32)
CREATININE: 0.81 mg/dL (ref 0.44–1.00)
Calcium: 8.1 mg/dL — ABNORMAL LOW (ref 8.9–10.3)
GFR calc Af Amer: >60 mL/min (ref >60)
GFR calc non Af Amer: >60 mL/min (ref >60)
GLUCOSE: 100 mg/dL — AB (ref 65–99)
Potassium: 2.9 mmol/L — ABNORMAL LOW (ref 3.5–5.1)
SODIUM: 140 mmol/L (ref 135–145)
Total Bilirubin: 0.4 mg/dL (ref 0.3–1.2)
Total Protein: 5.5 g/dL — ABNORMAL LOW (ref 6.5–8.1)

## 2015-04-11 LAB — TROPONIN I
Troponin I: <0.03 ng/mL (ref <0.031)
Troponin I: 0.03 ng/mL (ref <0.031)

## 2015-04-11 LAB — TSH: TSH: 3.297 u[IU]/mL (ref 0.350–4.500)

## 2015-04-11 LAB — BRAIN NATRIURETIC PEPTIDE: B NATRIURETIC PEPTIDE 5: 310 pg/mL — AB (ref 0.0–100.0)

## 2015-04-11 LAB — PROTIME-INR
INR: 1.09 (ref 0.00–1.49)
Prothrombin Time: 14.3 seconds (ref 11.6–15.2)

## 2015-04-11 LAB — POC OCCULT BLOOD, ED: FECAL OCCULT BLD: POSITIVE — AB

## 2015-04-11 LAB — D-DIMER, QUANTITATIVE (NOT AT ARMC): D DIMER QUANT: 1.19 ug{FEU}/mL — AB (ref 0.00–0.50)

## 2015-04-11 LAB — MAGNESIUM: Magnesium: 2.2 mg/dL (ref 1.7–2.4)

## 2015-04-11 MED ORDER — MORPHINE SULFATE (PF) 2 MG/ML IV SOLN
1.0000 mg | INTRAVENOUS | Status: DC | PRN
  Administered 2015-04-13: 1 mg via INTRAVENOUS
  Filled 2015-04-11: qty 1

## 2015-04-11 MED ORDER — FE FUMARATE-B12-VIT C-FA-IFC PO CAPS
1.0000 | ORAL_CAPSULE | Freq: Every day | ORAL | Status: DC
  Administered 2015-04-12: 1 via ORAL
  Filled 2015-04-11: qty 1

## 2015-04-11 MED ORDER — PANTOPRAZOLE SODIUM 40 MG IV SOLR
40.0000 mg | Freq: Once | INTRAVENOUS | Status: AC
  Administered 2015-04-11: 40 mg via INTRAVENOUS
  Filled 2015-04-11: qty 40

## 2015-04-11 MED ORDER — DONEPEZIL HCL 5 MG PO TABS
10.0000 mg | ORAL_TABLET | Freq: Every day | ORAL | Status: DC
  Administered 2015-04-11 – 2015-04-13 (×3): 10 mg via ORAL
  Filled 2015-04-11 (×3): qty 2

## 2015-04-11 MED ORDER — METOPROLOL TARTRATE 1 MG/ML IV SOLN: 5.0000 mg | Freq: Four times a day (QID) | INTRAVENOUS | Status: DC | PRN

## 2015-04-11 MED ORDER — PANTOPRAZOLE SODIUM 40 MG PO TBEC: 40.0000 mg | DELAYED_RELEASE_TABLET | Freq: Every day | ORAL | Status: DC

## 2015-04-11 MED ORDER — ONDANSETRON HCL 4 MG PO TABS: 4.0000 mg | ORAL_TABLET | Freq: Four times a day (QID) | ORAL | Status: DC | PRN

## 2015-04-11 MED ORDER — POLYETHYLENE GLYCOL 3350 17 G PO PACK
17.0000 g | PACK | Freq: Every day | ORAL | Status: DC
  Administered 2015-04-11: 17 g via ORAL
  Filled 2015-04-11: qty 1

## 2015-04-11 MED ORDER — SODIUM CHLORIDE 0.9 % IV SOLN: INTRAVENOUS | Status: DC

## 2015-04-11 MED ORDER — SODIUM CHLORIDE 0.9 % IV BOLUS (SEPSIS)
500.0000 mL | Freq: Once | INTRAVENOUS | Status: AC
  Administered 2015-04-11: 500 mL via INTRAVENOUS

## 2015-04-11 MED ORDER — ONDANSETRON HCL 4 MG/2ML IJ SOLN: 4.0000 mg | Freq: Four times a day (QID) | INTRAMUSCULAR | Status: DC | PRN

## 2015-04-11 MED ORDER — NITROGLYCERIN 0.4 MG SL SUBL: 0.4000 mg | SUBLINGUAL_TABLET | SUBLINGUAL | Status: DC | PRN

## 2015-04-11 MED ORDER — ISOSORBIDE MONONITRATE ER 60 MG PO TB24
30.0000 mg | ORAL_TABLET | Freq: Every day | ORAL | Status: DC
  Administered 2015-04-11 – 2015-04-13 (×3): 30 mg via ORAL
  Filled 2015-04-11 (×3): qty 1

## 2015-04-11 MED ORDER — ATORVASTATIN CALCIUM 10 MG PO TABS
10.0000 mg | ORAL_TABLET | Freq: Every day | ORAL | Status: DC
  Administered 2015-04-11 – 2015-04-12 (×2): 10 mg via ORAL
  Filled 2015-04-11 (×2): qty 1

## 2015-04-11 MED ORDER — ACETAMINOPHEN 650 MG RE SUPP: 650.0000 mg | Freq: Four times a day (QID) | RECTAL | Status: DC | PRN

## 2015-04-11 MED ORDER — POTASSIUM CHLORIDE IN NACL 40-0.9 MEQ/L-% IV SOLN
INTRAVENOUS | Status: DC
  Administered 2015-04-11 – 2015-04-13 (×3): 70 mL/h via INTRAVENOUS

## 2015-04-11 MED ORDER — POTASSIUM CHLORIDE CRYS ER 10 MEQ PO TBCR
10.0000 meq | EXTENDED_RELEASE_TABLET | Freq: Two times a day (BID) | ORAL | Status: DC
  Administered 2015-04-11 – 2015-04-13 (×4): 10 meq via ORAL
  Filled 2015-04-11 (×4): qty 1

## 2015-04-11 MED ORDER — POTASSIUM CHLORIDE 10 MEQ/100ML IV SOLN
10.0000 meq | INTRAVENOUS | Status: DC
  Administered 2015-04-11: 10 meq via INTRAVENOUS
  Filled 2015-04-11: qty 100

## 2015-04-11 MED ORDER — ACETAMINOPHEN 325 MG PO TABS: 650.0000 mg | ORAL_TABLET | Freq: Four times a day (QID) | ORAL | Status: DC | PRN

## 2015-04-11 MED ORDER — PANTOPRAZOLE SODIUM 40 MG IV SOLR
40.0000 mg | Freq: Two times a day (BID) | INTRAVENOUS | Status: DC
  Administered 2015-04-11 – 2015-04-13 (×4): 40 mg via INTRAVENOUS
  Filled 2015-04-11 (×4): qty 40

## 2015-04-11 MED ORDER — SIMETHICONE 80 MG PO CHEW: 80.0000 mg | CHEWABLE_TABLET | Freq: Four times a day (QID) | ORAL | Status: DC | PRN

## 2015-04-11 MED ORDER — OMEGA-3-ACID ETHYL ESTERS 1 G PO CAPS
1000.0000 mg | ORAL_CAPSULE | Freq: Every day | ORAL | Status: DC
  Administered 2015-04-11 – 2015-04-12 (×2): 1000 mg via ORAL
  Filled 2015-04-11 (×2): qty 1

## 2015-04-11 MED ORDER — HYDROCODONE-ACETAMINOPHEN 5-325 MG PO TABS: 1.0000 | ORAL_TABLET | ORAL | Status: DC | PRN

## 2015-04-11 NOTE — ED Provider Notes (Signed)
CSN: [id number]     Arrival date & time    History   First MD Initiated Contact with Patient 04/11/15 1244     Chief Complaint  Patient presents with  . Chest Pain     (Consider location/radiation/quality/duration/timing/severity/associated sxs/prior Treatment) HPI Comments: Patient presents by EMS, c/o chest pain, ongoing since this morning.  Family states n outpatient chest pain but never this severe. It has been sharp and stabbing all morning long associated with some abdominal pain. Patient with history of CAD status post bypass, discharged from the hospital 2 days ago after stay for chest pain and anemia. Had EGD that did not show source of bleeding. Family uncertain if patient is still having blood in her stools. No vomiting. Endorses shortness of breath or nausea but no diaphoresis. Chest pain is worse with movement and worse with ambulation. No fever. No blood thinner use. She is not taking her aspirin since she was discharged from the hospital.  The history is provided by the patient, a relative and the EMS personnel.    Past Medical History  Diagnosis Date  . CAD (coronary artery disease)     CABG surgery in 06/2005 for left main disease; normal EF; negative stress nuclear 08/2008  . Cerebrovascular disease     1993-CVA; 08/2008-mild atherosclerosis without focal stenosis  . Syncope      Negative even recorder dated 3/08  . Tobacco abuse     60 pack years; continuing  . Orthostatic hypotension   . Diverticulosis   . Osteopenia   . GERD (gastroesophageal reflux disease)   . Hypertension   . Mental disorder     mild dementia  . Anemia   . Sinus bradycardia     s/p STJ Assurity dual chamber pacemaker by Dr Rayann Heman 02/2013  . Frequent PVCs   . Family history of anesthesia complication     "makes daughter sick" (02/09/2013)  . High cholesterol   . Asthma   . Chronic bronchitis ([place])     "haven't had it in last 2-3 years; used to get it q yr" (02/09/2013)  . Pneumonia    "couple times; several years ago" (02/09/2013)  . [id number])     "sometimes weekly" (02/09/2013)  . Arthritis     "fingers; back" (02/09/2013)  . Chronic back pain greater than 3 months duration     "nerve damage down into left leg" (02/09/2013)  . Frequent UTI   . Dementia     "more than mild; having more short term memory loss recently; on Aricept" (02/09/2013)  . Dizziness     chronic  . COPD (chronic obstructive pulmonary disease) ([place])   . Stroke [facility])     20-30 yrs ago   Past Surgical History  Procedure Laterality Date  . Abdominal hysterectomy  1990  . Ventral hernia repair    . Appendectomy    . Tonsillectomy    . Colonoscopy  10/10/2011    Procedure: COLONOSCOPY;  Surgeon: Rogene Houston, MD;  [facility];  Service: Endoscopy;  Laterality: N/A;  1230  . Esophagogastroduodenoscopy  10/10/2011    Procedure: ESOPHAGOGASTRODUODENOSCOPY (EGD);  Surgeon: Rogene Houston, MD;  Location: [facility];  Service: Endoscopy;  Laterality: N/A;  . Pacemaker insertion  02/09/2013    STJ Assurity pacemaker implanted by Dr Rayann Heman for symptomatic bradycardia  . Hernia repair    . Coronary artery bypass graft  2007    Left main disease  . Cardiac catheterization  2007  .  Cataract extraction, bilateral    . Permanent pacemaker insertion N/A 02/09/2013    Procedure: PERMANENT PACEMAKER INSERTION;  Surgeon: Coralyn Mark, MD;  Location: [place] CATH LAB;  Service: Cardiovascular;  Laterality: N/A;  . Esophagogastroduodenoscopy N/A 04/08/2015    Procedure: ESOPHAGOGASTRODUODENOSCOPY (EGD);  Surgeon: Rogene Houston, MD;  [facility];  Service: Endoscopy;  Laterality: N/A;   Family History  Problem Relation Age of Onset  . Heart disease    . Arthritis    . Lung disease    . Asthma     Social History  Substance Use Topics  . Smoking status: Former Smoker -- 0.50 packs/day for 60 years    Types: Cigarettes    Start date: 12/12/1952    Quit date: 10/20/2006  . Smokeless  tobacco: Never Used     Comment: 02/09/2013 "quit smoking in ~ 2009"  . Alcohol Use: No   OB History    Gravida Para Term Preterm AB TAB SAB Ectopic Multiple Living   3 3 3             Review of Systems  Constitutional: Positive for diaphoresis, activity change, appetite change and fatigue. Negative for fever.  HENT: Negative for congestion.   Respiratory: Positive for chest tightness and shortness of breath. Negative for cough.   Cardiovascular: Positive for chest pain.  Gastrointestinal: Positive for nausea, abdominal pain, diarrhea and blood in stool. Negative for vomiting.  Genitourinary: Negative for dysuria, hematuria, vaginal bleeding and vaginal discharge.  Musculoskeletal: Positive for myalgias and arthralgias.  Neurological: Positive for dizziness, weakness and light-headedness. Negative for headaches.  A complete 10 system review of systems was obtained and all systems are negative except as noted in the HPI and PMH.      Allergies  Ativan; Cephalexin; Contrast media; Haldol; Iohexol; Penicillins; Shellfish allergy; and Sulfa antibiotics  Home Medications   Prior to Admission medications   Medication Sig Start Date End Date Taking? Authorizing Provider  acetaminophen (TYLENOL) 325 MG tablet Take 650 mg by mouth every 6 (six) hours as needed for mild pain. For pain   Yes Historical Provider, MD  atorvastatin (LIPITOR) 10 MG tablet TAKE ONE TABLET ONCE DAILY Patient taking differently: TAKE ONE TABLET AT BEDTIME 10/21/14  Yes Herminio Commons, MD  Calcium-Vitamin D (CALTRATE 600 PLUS-VIT D PO) Take 1 tablet by mouth at bedtime.    Yes Historical Provider, MD  Cyanocobalamin (B-12 COMPLIANCE INJECTION IJ) Inject 1 Dose as directed every 30 (thirty) days.   Yes Historical Provider, MD  donepezil (ARICEPT) 10 MG tablet Take 10 mg by mouth daily.    Yes Historical Provider, MD  ferrous [id number] C-folic acid (TRINSICON / FOLTRIN) capsule Take 1 capsule by mouth 3  (three) times daily after meals.   Yes Historical Provider, MD  ferrous sulfate 325 (65 FE) MG tablet Take 1 tablet (325 mg total) by mouth 2 (two) times daily with a meal. 04/09/15  Yes [name] Regalado, MD  isosorbide mononitrate (IMDUR) 30 MG 24 hr tablet Take 30 mg by mouth daily.  02/07/15  Yes Historical Provider, MD  Multiple Vitamin (MULTIVITAMIN WITH MINERALS) TABS tablet Take 1 tablet by mouth daily.   Yes Historical Provider, MD  Multiple Vitamins-Minerals (PRESERVISION AREDS PO) Take 1 capsule by mouth 2 (two) times daily.   Yes Historical Provider, MD  NITROSTAT 0.4 MG SL tablet PLACE 1 TABLET UNDER THE TONGUE EVERY 5 MINUTES AS NEEDED FOR CHEST PAIN **DO NOT EXCEED 3 DOSES IN  24 HOURS* Patient taking differently: PLACE 1 TABLET UNDER THE TONGUE EVERY 5 MINUTES AS NEEDED FOR CHEST PAIN (DO NOT EXCEED 3 DOSES IN 24 HOURS) 04/06/15  Yes Arnoldo Lenis, MD  Omega-3 Fatty Acids (FISH OIL) 1200 MG CAPS Take 1,200 mg by mouth at bedtime.    Yes Historical Provider, MD  omeprazole (PRILOSEC) 20 MG capsule Take 2 capsules (40 mg total) by mouth daily. Patient taking differently: Take 20 mg by mouth 2 (two) times daily before a meal.  04/09/15  Yes [name] Regalado, MD  polyethylene glycol (MIRALAX / GLYCOLAX) packet Take 17 g by mouth daily. 04/09/15  Yes [name] Regalado, MD  simethicone (MYLICON) 80 MG chewable tablet Chew 1 tablet (80 mg total) by mouth every 6 (six) hours as needed for flatulence. 04/09/15  Yes [name] Regalado, MD  zoledronic acid (RECLAST) 5 MG/100ML SOLN injection Inject 5 mg into the vein once.   Yes Historical Provider, MD   BP 153/43 mmHg  Pulse 120  Temp(Src) 97.5 F (36.4 C) (Oral)  Resp 16  Ht 5\' 4"  (1.626 m)  Wt 129 lb 6.4 oz (58.695 kg)  BMI 22.20 kg/m2  SpO2 96% Physical Exam  Constitutional: She is oriented to person, place, and time. She appears well-developed and well-nourished. She appears distressed.  Ill appearing, pale  HENT:  Head: Normocephalic  and atraumatic.  Mouth/Throat: Oropharynx is clear and moist. No oropharyngeal exudate.  Eyes: Conjunctivae and EOM are normal. Pupils are equal, round, and reactive to light.  Pale conjunctiva  Neck: Normal range of motion. Neck supple.  No meningismus.  Cardiovascular: Normal rate, regular rhythm, normal heart sounds and intact distal pulses.   No murmur heard. Pacemaker L chest  Pulmonary/Chest: Effort normal and breath sounds normal. No respiratory distress. She exhibits tenderness.  Abdominal: Soft. There is tenderness. There is no rebound and no guarding.  Intact femoral pulses  Genitourinary:  Chaperone present.  Maroon stool/melena  Musculoskeletal: Normal range of motion. She exhibits no edema or tenderness.  Neurological: She is alert and oriented to person, place, and time. No cranial nerve deficit. She exhibits normal muscle tone. Coordination normal.  No ataxia on finger to nose bilaterally. No pronator drift. 5/5 strength throughout. CN 2-12 intact.Equal grip strength. Sensation intact.   Skin: Skin is warm.  Psychiatric: She has a normal mood and affect. Her behavior is normal.  Nursing note and vitals reviewed.   ED Course  Procedures (including critical care time) Labs Review Labs Reviewed  CBC WITH DIFFERENTIAL/PLATELET - Abnormal; Notable for the following:    RBC 3.00 (*)    Hemoglobin 8.7 (*)    HCT 26.3 (*)    All other components within normal limits  COMPREHENSIVE METABOLIC PANEL - Abnormal; Notable for the following:    Potassium 2.9 (*)    Glucose, Bld 100 (*)    Calcium 8.1 (*)    Total Protein 5.5 (*)    Albumin 3.4 (*)    All other components within normal limits  BRAIN NATRIURETIC PEPTIDE - Abnormal; Notable for the following:    B Natriuretic Peptide 310.0 (*)    All other components within normal limits  CBC - Abnormal; Notable for the following:    RBC 3.19 (*)    Hemoglobin 9.3 (*)    HCT 28.5 (*)    All other components within normal  limits  D-DIMER, QUANTITATIVE (NOT AT [facility]) - Abnormal; Notable for the following:    D-Dimer, Quant 1.19 (*)  All other components within normal limits  POC OCCULT BLOOD, ED - Abnormal; Notable for the following:    Fecal Occult Bld POSITIVE (*)    All other components within normal limits  PROTIME-INR  TROPONIN I  TSH  TROPONIN I  MAGNESIUM  CBC  CBC  TROPONIN I  TROPONIN I  COMPREHENSIVE METABOLIC PANEL  TYPE AND SCREEN    Imaging Review Dg Chest Portable 1 View  04/11/2015  CLINICAL DATA:  Chest pain, history of pneumonia, COPD EXAM: PORTABLE CHEST 1 VIEW COMPARISON:  04/06/2015 FINDINGS: Cardiomediastinal silhouette is stable. Status post CABG. Dual lead cardiac pacemaker is unchanged in position. No infiltrate or pulmonary edema. Hyperinflation again noted. IMPRESSION: No active disease. Status post CABG. Hyperinflation again noted. Dual lead cardiac pacemaker is unchanged in position. Electronically Signed   By: Lahoma Crocker M.D.   On: 04/11/2015 13:15   Dg Abd Portable 1v  04/11/2015  CLINICAL DATA:  Nausea and anemia EXAM: PORTABLE ABDOMEN - 1 VIEW COMPARISON:  CT abdomen and pelvis July 02, 2005 FINDINGS: There is moderate stool throughout the colon. There is no bowel dilatation or air-fluid level suggesting obstruction. No free air is seen on this supine examination. There are apparent phleboliths in the pelvis. Lung bases are clear. There are foci of atherosclerotic calcification present. IMPRESSION: No bowel obstruction or free air.  Lung bases clear. Electronically Signed   By: Lowella Grip III M.D.   On: 04/11/2015 14:56   I have personally reviewed and evaluated these images and lab results as part of my medical decision-making.   EKG Interpretation   Date/Time:  Tuesday April 11 2015 12:56:21 EDT Ventricular Rate:  70 PR Interval:  138 QRS Duration: 107 QT Interval:  402 QTC Calculation: 434 R Axis:   80 Text Interpretation:  Atrial-paced complexes Repol  abnrm suggests  ischemia, anterolateral Nonspecific ST and T wave abnormality lateral ST  depression Confirmed by Wyvonnia Dusky  MD, Caprice Wasko [phone]) on 04/11/2015 1:06:07  PM      MDM   Final diagnoses:  Chest pain, unspecified chest pain type  Gastrointestinal hemorrhage, unspecified gastritis, unspecified gastrointestinal hemorrhage type   Acute and chronic chest pain worse since this morning. Recent admission for chest pain as well as rectal bleeding. EKG shows slight ST depression laterally similar previous.  Patient still with melena and maroon stools on exam. She appears pale. Hemoglobin 8.7. Was 9.2 2 days ago.  D/w Dr. Laural Golden.  Her family was not interested in colonoscopy. He recommends monitoring the hemoglobin. He will talk to the family about potentially doing other studies.  Patient's hemoglobin is essentially unchanged from discharge 2 days ago. Will not transfuse at this time. She is given a small fluid bolus and IV PPI.  Her chest pain is likely a combination of musculoskeletal and possibly ischemic due to her anemia. First troponin is negative. Daughter reports patient always has chest pain but became worse today.  With ongoing rectal bleeding and worsening chest pain.  Will admit back to hospital.  D/w Dr. Caryn Section.      Ezequiel Essex, MD 04/11/15 [phone]

## 2015-04-11 NOTE — ED Notes (Signed)
MD at bedside. 

## 2015-04-11 NOTE — ED Notes (Signed)
Pt reports intermittent sharp left side cp today with nausea and sob. Pt recently admitted to for cp/anemia.

## 2015-04-11 NOTE — H&P (Addendum)
[facility] History and Physical  [name] [id number] DOB: [birth year] DOA: 04/11/2015  Referring physician: ED physician, Dr. Wyvonnia Dusky PCP: Purvis Kilts, MD   Chief Complaint: Chest pain  HPI: [name] is a 82 y.o. female with a history of CAD, status post CABG 1, pacemaker in situ, chronic chest pain, GERD, and dementia. She was recently hospitalized from 04/06/2015 through 04/09/2015 for a GI bleed and acute blood loss anemia. The EGD at that time by Dr. Laural Golden, did not reveal any source of bleeding. Aspirin was stopped. The results were essentially negative. She was transfused 2 units of packed red blood cells. She also had chest pain during the previous hospitalization, but her troponin I was negative 2. The history has been provided primarily by her daughter Mrs. Manley. Accordingly, the patient has chronic chest pain, but today, the she reported worsening chest pain. The pain was sharp in nature and intermittent. It was associated with some nausea and shortness of breath, but no radiation or diaphoresis. She was given 2 sublingual nitroglycerin which did not relieve her pain. She was given 2 Tylenol which did relieve the pain a little. She did not take her PPI this morning. There has been no reported cough or pleurisy or flulike symptoms. No subjective fever or chills. No abdominal pain vomiting, diarrhea, or pain with urination. No unilateral pain or swelling in either leg.  In the ED, the patient was afebrile and hemodynamically stable. On the floor, she is mildly tachycardic with a modestly elevated blood pressure. Her EKG is nearly unchanged revealing atrial paced complexes, and lateral ST and T-wave abnormalities. Her chest and abdominal x-rays reveal no active disease. Her lab data are significant for a serum potassium of 2.9, BNP of 310, troponin I of less than 0.03, and hemoglobin of 8.7. She is being admitted for further evaluation and management.    Review  of Systems:  She has chronic confusion from her dementia; as above in history present illness; otherwise review of systems is negative.  Past Medical History  Diagnosis Date  . CAD (coronary artery disease)     CABG surgery in 06/2005 for left main disease; normal EF; negative stress nuclear 08/2008  . Cerebrovascular disease     1993-CVA; 08/2008-mild atherosclerosis without focal stenosis  . Syncope      Negative even recorder dated 3/08  . Tobacco abuse     60 pack years; continuing  . Orthostatic hypotension   . Diverticulosis   . Osteopenia   . GERD (gastroesophageal reflux disease)   . Hypertension   . Mental disorder     mild dementia  . Anemia   . Sinus bradycardia     s/p STJ Assurity dual chamber pacemaker by Dr Rayann Heman 02/2013  . Frequent PVCs   . Family history of anesthesia complication     "makes daughter sick" (02/09/2013)  . High cholesterol   . Asthma   . Chronic bronchitis ([place])     "haven't had it in last 2-3 years; used to get it q yr" (02/09/2013)  . Pneumonia     "couple times; several years ago" (02/09/2013)  . [id number])     "sometimes weekly" (02/09/2013)  . Arthritis     "fingers; back" (02/09/2013)  . Chronic back pain greater than 3 months duration     "nerve damage down into left leg" (02/09/2013)  . Frequent UTI   . Dementia     "more than mild; having more short term memory  loss recently; on Aricept" (02/09/2013)  . Dizziness     chronic  . COPD (chronic obstructive pulmonary disease) ([place])   . Stroke [facility])     20-30 yrs ago   Past Surgical History  Procedure Laterality Date  . Abdominal hysterectomy  1990  . Ventral hernia repair    . Appendectomy    . Tonsillectomy    . Colonoscopy  10/10/2011    Procedure: COLONOSCOPY;  Surgeon: Rogene Houston, MD;  Location: [facility];  Service: Endoscopy;  Laterality: N/A;  1230  . Esophagogastroduodenoscopy  10/10/2011    Procedure: ESOPHAGOGASTRODUODENOSCOPY (EGD);  Surgeon: Rogene Houston, MD;   Location: [facility];  Service: Endoscopy;  Laterality: N/A;  . Pacemaker insertion  02/09/2013    STJ Assurity pacemaker implanted by Dr Rayann Heman for symptomatic bradycardia  . Hernia repair    . Coronary artery bypass graft  2007    Left main disease  . Cardiac catheterization  2007  . Cataract extraction, bilateral    . Permanent pacemaker insertion N/A 02/09/2013    Procedure: PERMANENT PACEMAKER INSERTION;  Surgeon: Coralyn Mark, MD;  Location: [facility];  Service: Cardiovascular;  Laterality: N/A;  . Esophagogastroduodenoscopy N/A 04/08/2015    Procedure: ESOPHAGOGASTRODUODENOSCOPY (EGD);  Surgeon: Rogene Houston, MD;  Location: [facility];  Service: Endoscopy;  Laterality: N/A;   Social History: Patient is married. She lives with her husband. 2 sons live with them. She quit smoking about 8 years ago. Her smoking use included Cigarettes. She started smoking about 62 years ago. She has a 30 pack-year smoking history. She has never used smokeless tobacco. She reports that she does not drink alcohol or use illicit drugs.  Allergies  Allergen Reactions  . Ativan [Lorazepam]     Aggressive, hallucination  . Cephalexin Hives  . Contrast Media [Iodinated Diagnostic Agents]     Unknown  . Haldol [Haloperidol Lactate]     Aggressive, hallucination  . Iohexol     Urticaria, emesis, numbness during IVP performed at [facility] years ago  . Penicillins Hives    Has patient had a PCN reaction causing immediate rash, facial/tongue/throat swelling, SOB or lightheadedness with hypotension: Yes Has patient had a PCN reaction causing severe rash involving mucus membranes or skin necrosis: No Has patient had a PCN reaction that required hospitalization NoNo Has patient had a PCN reaction occurring within the last 10 years: No If all of the above answers are "NO", then may proceed with Cephalosporin use.   . Shellfish Allergy Other (See Comments)    Unknown  . Sulfa Antibiotics Hives      Family History  Problem Relation Age of Onset  . Heart disease    . Arthritis    . Lung disease    . Asthma       Prior to Admission medications   Medication Sig Start Date End Date Taking? Authorizing Provider  acetaminophen (TYLENOL) 325 MG tablet Take 650 mg by mouth every 6 (six) hours as needed for mild pain. For pain   Yes Historical Provider, MD  atorvastatin (LIPITOR) 10 MG tablet TAKE ONE TABLET ONCE DAILY Patient taking differently: TAKE ONE TABLET AT BEDTIME 10/21/14  Yes Herminio Commons, MD  Calcium-Vitamin D (CALTRATE 600 PLUS-VIT D PO) Take 1 tablet by mouth at bedtime.    Yes Historical Provider, MD  Cyanocobalamin (B-12 COMPLIANCE INJECTION IJ) Inject 1 Dose as directed every 30 (thirty) days.   Yes Historical Provider,  MD  donepezil (ARICEPT) 10 MG tablet Take 10 mg by mouth daily.    Yes Historical Provider, MD  ferrous [id number] C-folic acid (TRINSICON / FOLTRIN) capsule Take 1 capsule by mouth 3 (three) times daily after meals.   Yes Historical Provider, MD  ferrous sulfate 325 (65 FE) MG tablet Take 1 tablet (325 mg total) by mouth 2 (two) times daily with a meal. 04/09/15  Yes [name] Regalado, MD  isosorbide mononitrate (IMDUR) 30 MG 24 hr tablet Take 30 mg by mouth daily.  02/07/15  Yes Historical Provider, MD  Multiple Vitamin (MULTIVITAMIN WITH MINERALS) TABS tablet Take 1 tablet by mouth daily.   Yes Historical Provider, MD  Multiple Vitamins-Minerals (PRESERVISION AREDS PO) Take 1 capsule by mouth 2 (two) times daily.   Yes Historical Provider, MD  NITROSTAT 0.4 MG SL tablet PLACE 1 TABLET UNDER THE TONGUE EVERY 5 MINUTES AS NEEDED FOR CHEST PAIN DO NOT EXCEED 3 DOSES IN 24 HOURS Patient taking differently: PLACE 1 TABLET UNDER THE TONGUE EVERY 5 MINUTES AS NEEDED FOR CHEST PAIN (DO NOT EXCEED 3 DOSES IN 24 HOURS) 04/06/15  Yes Arnoldo Lenis, MD  Omega-3 Fatty Acids (FISH OIL) 1200 MG CAPS Take 1,200 mg by mouth at bedtime.    Yes Historical  Provider, MD  omeprazole (PRILOSEC) 20 MG capsule Take 2 capsules (40 mg total) by mouth daily. Patient taking differently: Take 20 mg by mouth 2 (two) times daily before a meal.  04/09/15  Yes [name] Regalado, MD  polyethylene glycol (MIRALAX / GLYCOLAX) packet Take 17 g by mouth daily. 04/09/15  Yes [name] Regalado, MD  simethicone (MYLICON) 80 MG chewable tablet Chew 1 tablet (80 mg total) by mouth every 6 (six) hours as needed for flatulence. 04/09/15  Yes [name] Regalado, MD  zoledronic acid (RECLAST) 5 MG/100ML SOLN injection Inject 5 mg into the vein once.   Yes Historical Provider, MD   Physical Exam: Filed Vitals:   04/11/15 1400 04/11/15 1430 04/11/15 1500 04/11/15 1526  BP: 137/68 131/77 145/75 153/43  Pulse:  77  120  Temp:   97.8 F (36.6 C) 97.5 F (36.4 C)  TempSrc:   Oral Oral  Resp: 15 16 18 16   Height:    5\' 4"  (1.626 m)  Weight:    58.695 kg (129 lb 6.4 oz)  SpO2:  96% 96%     Wt Readings from Last 3 Encounters:  04/11/15 58.695 kg (129 lb 6.4 oz)  04/06/15 56 kg (123 lb 7.3 oz)  03/30/15 57.153 kg (126 lb)    General:  Appears calm and comfortable; 82 year old Caucasian woman in no acute distress. Eyes: PERRL, normal lids, irises & conjunctiva; conjunctivae are clear and sclerae are white. ENT: grossly normal hearing; oropharynx reveals poor dentition and mildly dry mucous membranes. Neck: no LAD, masses or thyromegaly Cardiovascular: S1, S2, with ectopy; no pedal edema, but right greater than left leg enlargement. Telemetry: Atrial paced complexes.  Respiratory: CTA bilaterally, no w/r/r. Normal respiratory effort. Abdomen: soft, positive bowel sounds, soft, nontender, nondistended. Rectal: Per EDP, her stool was maroon colored and guaiac positive. Skin: no rash or induration seen on limited exam Musculoskeletal: grossly normal tone BUE/BLE; right greater than left leg enlargement without appreciable edema or calf pain. Psychiatric: She has a flat affect.  She is alert and oriented to her daughter and husband, but not to the name of the hospital, month, or year. She is forgetful. Neurologic: grossly non-focal; cranial nerves II  through XII are intact.           Labs on Admission:  Basic Metabolic Panel:  Recent Labs Lab 04/06/15 1247 04/07/15 0529 04/08/15 0614 04/09/15 0607 04/11/15 1304  NA 141 141 140 142 140  K 4.0 3.8 3.6 3.5 2.9*  CL 111 108 107 110 108  CO2 23 24 26 26 25   GLUCOSE 103* 109* 102* 95 100*  BUN 20 20 16 11 16   CREATININE 0.79 0.79 0.79 0.70 0.81  CALCIUM 8.5* 8.1* 7.8* 7.7* 8.1*   Liver Function Tests:  Recent Labs Lab 04/06/15 1250 04/07/15 0529 04/11/15 1304  AST 22 20 24   ALT 15 16 16   ALKPHOS 44 37* 38  BILITOT 0.4 0.4 0.4  PROT 5.7* 5.6* 5.5*  ALBUMIN 3.5 3.4* 3.4*   No results for input(s): LIPASE, AMYLASE in the last 168 hours. No results for input(s): AMMONIA in the last 168 hours. CBC:  Recent Labs Lab 04/06/15 1247 04/07/15 0529 04/07/15 1048 04/08/15 0614 04/09/15 0607 04/11/15 1304  WBC 7.5 7.0 8.5 10.4 6.4 8.3  NEUTROABS 5.8  --   --   --   --  6.3  HGB 7.5* 8.4* 10.6* 9.6* 9.2* 8.7*  HCT 22.4* 25.5* 31.4* 28.3* 27.4* 26.3*  MCV 91.8 90.4 87.0 86.8 87.3 87.7  PLT 261 255 222 209 215 216   Cardiac Enzymes:  Recent Labs Lab 04/06/15 2022 04/07/15 1044 04/07/15 1620 04/07/15 2239 04/11/15 1304  TROPONINI <0.03 <0.03 <0.03 <0.03 <0.03    BNP (last 3 results)  Recent Labs  04/06/15 1250 04/11/15 1304  BNP 211.0* 310.0*    ProBNP (last 3 results) No results for input(s): PROBNP in the last 8760 hours.  CBG: No results for input(s): GLUCAP in the last 168 hours.  Radiological Exams on Admission: Dg Chest Portable 1 View  04/11/2015  CLINICAL DATA:  Chest pain, history of pneumonia, COPD EXAM: PORTABLE CHEST 1 VIEW COMPARISON:  04/06/2015 FINDINGS: Cardiomediastinal silhouette is stable. Status post CABG. Dual lead cardiac pacemaker is unchanged in position.  No infiltrate or pulmonary edema. Hyperinflation again noted. IMPRESSION: No active disease. Status post CABG. Hyperinflation again noted. Dual lead cardiac pacemaker is unchanged in position. Electronically Signed   By: Lahoma Crocker M.D.   On: 04/11/2015 13:15   Dg Abd Portable 1v  04/11/2015  CLINICAL DATA:  Nausea and anemia EXAM: PORTABLE ABDOMEN - 1 VIEW COMPARISON:  CT abdomen and pelvis July 02, 2005 FINDINGS: There is moderate stool throughout the colon. There is no bowel dilatation or air-fluid level suggesting obstruction. No free air is seen on this supine examination. There are apparent phleboliths in the pelvis. Lung bases are clear. There are foci of atherosclerotic calcification present. IMPRESSION: No bowel obstruction or free air.  Lung bases clear. Electronically Signed   By: Lowella Grip III M.D.   On: 04/11/2015 14:56    EKG: Independently reviewed.   Assessment/Plan Principal Problem:   Atypical chest pain Active Problems:   Chronic chest pain   Essential hypertension   Lower GI bleed   Acute blood loss anemia   GERD (gastroesophageal reflux disease)   CAD (coronary artery disease)   Hypokalemia   Pacemaker   Dementia  1. Atypical chest pain in a patient with CAD/status CABG 1 and pacemaker in situ. Troponin I was negative, but she does have some chronic EKG changes. Aspirin was discontinued due to the GI bleeding. She is treated chronically with Imdur and statin. 2. Chronic chest pain,  per family. 3. GERD. Patient missed her dose of Protonix this morning. 4. Essential hypertension. Patient does not appear to be treated with antihypertensive medication. 5. Lower GI bleed. Patient had both melena and maroon color stools on exam per EDP. Patient was recently hospitalized for acute blood loss anemia, but the EGD revealed no source of bleeding. 6. Acute blood loss anemia. Patient was transfused 2 units of packed red blood cells during the previous hospitalization. Her  hemoglobin was 9.204/2/17 and 8.7 today. 7. Hypokalemia. The patient's potassium is 2.9, down from 3.5 a couple days ago. Etiology unknown. 8. Dementia. Currently stable. She is on Aricept chronically.   Plan: 1. Patient was given IV Protonix in the ED. This will be continued. We'll continue sublingual nitroglycerin as needed and Imdur. Will order as needed Vicodin or morphine for pain. 2. Will start gentle IV fluids. Will start clear liquid diet. As needed Zofran for nausea will be ordered. 3. Will order when necessary IV metoprolol for hypertension or sustained tachycardia. 4. Patient received 2 runs of IV potassium in the ED. Will add 40 mEq of potassium chloride to her IV fluids. We'll gently give 10 mEq of potassium chloride twice a day until she is taking solid foods. We'll check a magnesium level. 5. Will type and cross 2 units of packed red blood cells. Will transfuse if her hemoglobin decreases to 8 g or less. CBC will be ordered every 6 hours 24 hours. 6. Will order d-dimer, 2-D echocardiogram and bilateral lower extremity venous ultrasound for evaluation of chest pain. 7. Will consult gastroenterology due to maroon-colored stools.     Code Status: Full code DVT Prophylaxis: SCDs Family Communication: Discussed with husband and daughter Disposition Plan: Discharge when clinically appropriate, likely in a few days.  Time spent: One hour  [facility] Pager [phone]

## 2015-04-12 ENCOUNTER — Inpatient Hospital Stay (HOSPITAL_COMMUNITY): Payer: Medicare Other

## 2015-04-12 ENCOUNTER — Encounter (HOSPITAL_COMMUNITY): Payer: Self-pay | Admitting: *Deleted

## 2015-04-12 ENCOUNTER — Encounter (HOSPITAL_COMMUNITY)
Admission: EM | Disposition: A | Discharge status: Home Health | Payer: Self-pay | Source: Home / Self Care | Attending: Internal Medicine

## 2015-04-12 DIAGNOSIS — D62 Acute posthemorrhagic anemia: Secondary | ICD-10-CM

## 2015-04-12 DIAGNOSIS — K5730 Diverticulosis of large intestine without perforation or abscess without bleeding: Secondary | ICD-10-CM

## 2015-04-12 DIAGNOSIS — R0789 Other chest pain: Secondary | ICD-10-CM

## 2015-04-12 DIAGNOSIS — K625 Hemorrhage of anus and rectum: Secondary | ICD-10-CM

## 2015-04-12 DIAGNOSIS — K5520 Angiodysplasia of colon without hemorrhage: Secondary | ICD-10-CM

## 2015-04-12 HISTORY — PX: COLONOSCOPY: SHX5424

## 2015-04-12 LAB — COMPREHENSIVE METABOLIC PANEL
ALT: 14 U/L (ref 14–54)
AST: 21 U/L (ref 15–41)
Albumin: 2.9 g/dL — ABNORMAL LOW (ref 3.5–5.0)
Alkaline Phosphatase: 30 U/L — ABNORMAL LOW (ref 38–126)
Anion gap: 7 (ref 5–15)
BILIRUBIN TOTAL: 0.4 mg/dL (ref 0.3–1.2)
BUN: 11 mg/dL (ref 6–20)
CALCIUM: 7.7 mg/dL — AB (ref 8.9–10.3)
CHLORIDE: 115 mmol/L — AB (ref 101–111)
CO2: 22 mmol/L (ref 22–32)
CREATININE: 0.67 mg/dL (ref 0.44–1.00)
Glucose, Bld: 83 mg/dL (ref 65–99)
Potassium: 4.1 mmol/L (ref 3.5–5.1)
Sodium: 144 mmol/L (ref 135–145)
TOTAL PROTEIN: 4.7 g/dL — AB (ref 6.5–8.1)

## 2015-04-12 LAB — CBC
HEMATOCRIT: 24.7 % — AB (ref 36.0–46.0)
HEMATOCRIT: 32.9 % — AB (ref 36.0–46.0)
HEMOGLOBIN: 8.1 g/dL — AB (ref 12.0–15.0)
Hemoglobin: 10.8 g/dL — ABNORMAL LOW (ref 12.0–15.0)
MCH: 29.2 pg (ref 26.0–34.0)
MCH: 29.4 pg (ref 26.0–34.0)
MCHC: 32.8 g/dL (ref 30.0–36.0)
MCHC: 32.8 g/dL (ref 30.0–36.0)
MCV: 89.2 fL (ref 78.0–100.0)
MCV: 89.6 fL (ref 78.0–100.0)
PLATELETS: 173 10*3/uL (ref 150–400)
Platelets: 160 10*3/uL (ref 150–400)
RBC: 2.77 MIL/uL — AB (ref 3.87–5.11)
RBC: 3.67 MIL/uL — ABNORMAL LOW (ref 3.87–5.11)
RDW: 14.5 % (ref 11.5–15.5)
RDW: 14.7 % (ref 11.5–15.5)
WBC: 6.6 10*3/uL (ref 4.0–10.5)
WBC: 10.8 10*3/uL — AB (ref 4.0–10.5)

## 2015-04-12 LAB — TROPONIN I: TROPONIN I: 0.03 ng/mL (ref <0.031)

## 2015-04-12 LAB — PREPARE RBC (CROSSMATCH)

## 2015-04-12 SURGERY — COLONOSCOPY
Anesthesia: Moderate Sedation

## 2015-04-12 MED ORDER — PEG 3350-KCL-NA BICARB-NACL 420 G PO SOLR
4000.0000 mL | Freq: Once | ORAL | Status: AC
  Administered 2015-04-12: 4000 mL via ORAL
  Filled 2015-04-12: qty 4000

## 2015-04-12 MED ORDER — MIDAZOLAM HCL 5 MG/5ML IJ SOLN
INTRAMUSCULAR | Status: AC
  Filled 2015-04-12: qty 10

## 2015-04-12 MED ORDER — SODIUM CHLORIDE 0.9 % IV SOLN: Freq: Once | INTRAVENOUS | Status: DC

## 2015-04-12 MED ORDER — SIMETHICONE 40 MG/0.6ML PO SUSP
ORAL | Status: DC | PRN
  Administered 2015-04-12: 2.5 mL

## 2015-04-12 MED ORDER — MEPERIDINE HCL 50 MG/ML IJ SOLN
INTRAMUSCULAR | Status: DC | PRN
  Administered 2015-04-12: 15 mg via INTRAVENOUS
  Administered 2015-04-12: 10 mg via INTRAVENOUS

## 2015-04-12 MED ORDER — MEPERIDINE HCL 50 MG/ML IJ SOLN
INTRAMUSCULAR | Status: AC
  Filled 2015-04-12: qty 1

## 2015-04-12 MED ORDER — MIDAZOLAM HCL 5 MG/5ML IJ SOLN
INTRAMUSCULAR | Status: DC | PRN
  Administered 2015-04-12 (×2): 1 mg via INTRAVENOUS

## 2015-04-12 NOTE — Progress Notes (Signed)
Patient ID: [name], female   DOB: 01-12-1925, 82 y.o.   MRN: [id number] Patient admitted thru the ED yesterday. She had c/o chest pain. She had chest pain with walking with SOB.  While in the ED, stool was marooned/black in color and guaiac positive. She did have nausea yesterday.  [facility] was discharged from [facility] on Sunday. She underwent an EGD by Dr. Laural Golden on 04/08/2015 for chronic blood loss, recent GI bleed.  Normal esophagus. Gastric mucosal atrophy. No bleeding source identified in upper GI tract.  She received 2 units of PRBCs last admission. Hemoglobin in the ED 8.7 this admission.   Patient was evaluated for GI bleed and anemia in October 2013 and underwent EGD and colonoscopy. EGD was normal and colonoscopy revealed few small left-sided AV malformations in diverticulosis. Hx of chronic chest pain. Her troponin's x 3 are negative this admission. Daughter states patient does not have Power of Attorney.   04/11/2015 D. Dimer 1.19 BNP: 310  10/10/2011: Colonoscopy followed by EGD.  Indications: [name] who was recently found to have iron deficiency anemia and heme-positive stools. She has few months history of nonbloody diarrhea. She denies melena rectal bleeding abdominal pain chronic heartburn nausea vomiting or dysphagia. She takes low-dose aspirin. Patient's last colonoscopy was several years ago.  Impression:  Normal terminal ileum. 3 small nonbleeding AV malformations at ascending colon. Multiple diverticula at sigmoid colon. Random biopsies taken from sigmoid colon because of history of diarrhea. Normal esophagogastroduodenoscopy. It is possible she's been losing blood intermittently from AV malformations.  Blood pressure 122/54, pulse 61, temperature 98.1 F (36.7 C), temperature source Oral, resp. rate 20, height 5\' 4"  (1.626 m), weight 129  lb 6.4 oz (58.695 kg), SpO2 99    Alert  Skin warm and dry. Oral mucosa is moist.   . Sclera anicteric, conjunctivae is pink. Thyroid not enlarged. No cervical lymphadenopathy. Lungs clear. Heart regular rate and rhythm.  Abdomen is soft. Bowel sounds are positive. No hepatomegaly. No abdominal masses felt. Tenderness left lower quadrant. No edema to lower extremities.   Assessment: GI bleed. Recent EGD did not reveal source of bleeding. Patient is agreeable for colonoscopy. She does not have a POA.

## 2015-04-12 NOTE — Progress Notes (Signed)
Received report from Vista Deck, RN. Assumed care of patient. Rosary Lively, SN ROCC

## 2015-04-12 NOTE — Op Note (Signed)
[facility] Patient Name: [name] Procedure Date: 04/12/2015 1:34 PM MRN: [id number] Date of Birth: 1925-02-23 Attending MD: Hildred Laser , MD CSN: [id number] Age: 82 Admit Type: Inpatient Procedure:                Colonoscopy Indications:              Rectal bleeding, Acute post hemorrhagic anemia Providers:                Hildred Laser, MD, Lurline Del, RN, Georgeann Oppenheim,                            Technician Referring MD:             Rexene Alberts, MD Medicines:                Meperidine 30 mg IV, Midazolam 2 mg IV Complications:            No immediate complications. Estimated Blood Loss:     Estimated blood loss: none. Procedure:                Pre-Anesthesia Assessment:                           - Prior to the procedure, a History and Physical                            was performed, and patient medications and                            allergies were reviewed. The patient's tolerance of                            previous anesthesia was also reviewed. The risks                            and benefits of the procedure and the sedation                            options and risks were discussed with the patient.                            All questions were answered, and informed consent                            was obtained. Prior Anticoagulants: The patient has                            taken aspirin, last dose was 5 days prior to                            procedure. ASA Grade Assessment: III - A patient                            with severe systemic disease. After reviewing the  risks and benefits, the patient was deemed in                            satisfactory condition to undergo the procedure.                           After obtaining informed consent, the colonoscope                            was passed under direct vision. Throughout the                            procedure, the patient's blood pressure, pulse, and                  oxygen saturations were monitored continuously. The                            [id number]) scope was introduced through                            the anus and advanced to the the cecum, identified                            by appendiceal orifice and ileocecal valve. The                            colonoscopy was performed without difficulty. The                            patient tolerated the procedure well. The quality                            of the bowel preparation was adequate. The                            ileocecal valve, appendiceal orifice, and rectum                            were photographed. Scope In: 3:06:12 PM Scope Out: 3:31:11 PM Scope Withdrawal Time: 0 hours 10 minutes 43 seconds  Total Procedure Duration: 0 hours 24 minutes 59 seconds  Findings:      Two small localized angiodysplastic lesions without bleeding were found       in the ascending colon.      Multiple small and large-mouthed diverticula were found in the sigmoid       colon.      The retroflexed view of the distal rectum and anal verge was normal and       showed no anal or rectal abnormalities. Impression:               - Two non-bleeding colonic angiodysplastic lesions                            at ascending colon.                           -  Diverticulosis in the sigmoid colon without                            stigmata of active bleed but felt to be source of                            GI bleed.                           - No specimens collected. Moderate Sedation:      Moderate (conscious) sedation was administered by the endoscopy nurse       and supervised by the endoscopist. The following parameters were       monitored: oxygen saturation, heart rate, blood pressure, CO2       capnography and response to care. Total physician intraservice time was       30 minutes. Recommendation:           - Return patient to hospital ward for ongoing care.                            - Cardiac diet today.                           - Continue present medications.                           - No aspirin, ibuprofen, naproxen, or other                            non-steroidal anti-inflammatory drugs for 2 weeks.                           - No repeat colonoscopy due to age. Procedure Code(s):        --- Professional ---                           [id number], Colonoscopy, flexible; diagnostic, including                            collection of specimen(s) by brushing or washing,                            when performed (separate procedure)                           99152, Moderate sedation services provided by the                            same physician or other qualified health care                            professional performing the diagnostic or                            therapeutic service that the sedation supports,  requiring the presence of an independent trained                            observer to assist in the monitoring of the                            patient's level of consciousness and physiological                            status; initial 15 minutes of intraservice time,                            patient age 47 years or older                           [phone], Moderate sedation services; each additional                            15 minutes intraservice time Diagnosis Code(s):        --- Professional ---                           K55.20, Angiodysplasia of colon without hemorrhage                           K62.5, Hemorrhage of anus and rectum                           D62, Acute posthemorrhagic anemia                           K57.30, Diverticulosis of large intestine without                            perforation or abscess without bleeding CPT copyright 2016 [employer]. All rights reserved. The codes documented in this report are preliminary and upon coder review may  be revised to meet current compliance  requirements. Hildred Laser, MD Hildred Laser, MD 04/12/2015 3:50:13 PM This report has been signed electronically. Number of Addenda: 0

## 2015-04-12 NOTE — Progress Notes (Signed)
hgb 7.8 Dr. Ree Kida.

## 2015-04-12 NOTE — Progress Notes (Signed)
[facility]  Patient Status: Active (receiving services up to time of hospitalization)  [facility] is providing the following services: RN, PT and HHA  If patient discharges after hours, please call [phone].   [name] 04/12/2015, 10:23 AM

## 2015-04-12 NOTE — Care Management Note (Addendum)
Case Management Note  Patient Details  Name: [name] MRN: [id number] Date of Birth: [birth year]  Subjective/Objective:                  Pt admitted with GIB. Pt is from home, lives with husband and is ind with ADL's. Pt has 2 children who provide strong support but are unable to provide 24/7 supervision. Pt is active with [facility] for nursing, PT and HHA services. Pt's daughter plans for pt to return home with resumption of [facility] services through [facility] but is also interested in more long term options. Daughter is asking about applying for special assistance medicaid.   Action/Plan: Long term options discussed with daughter including PD aids, spending down money and ALF placement before and after medicaid requirements are met. Pt's daughter provided with list of PD agencies.  Referral made to the financial counselor to further discuss medicaid requirements. Will cont to follow. Pt will need order to resume [place] services at [facility]. Romualdo Bolk, of [facility], made aware pt is in hospital and will obtain pt info from chart.   Expected Discharge Date:    04/13/2015              Expected Discharge Plan:  [name]  In-House Referral:  Financial Counselor  Discharge planning Services  CM Consult  Post Acute Care Choice:  Home Health, Resumption of Svcs/PTA Provider Choice offered to:  Patient, Adult Children  DME Arranged:    DME Agency:     HH Arranged:  RN, PT, Nurse's Aide [place] Agency:  [facility]  Status of Service:  In process, will continue to follow  Medicare Important Message Given:    Date Medicare IM Given:    Medicare IM give by:    Date Additional Medicare IM Given:    Additional Medicare Important Message give by:     If discussed at [place] of Stay Meetings, dates discussed:    Additional Comments:  Sherald Barge, RN 04/12/2015, 1:45 PM

## 2015-04-12 NOTE — Progress Notes (Signed)
[facility] PROGRESS NOTE  [name] [id number] DOB: 1925-12-01 DOA: 04/11/2015 PCP: Purvis Kilts, MD  Assessment/Plan: Atypical chest pain -Has ruled out for ACS with negative troponins and EKG without acute ischemic abnormality. -Wonder if anemia may have contributed and as such has been transfused 1 unit of PRBCs today. -do not anticipate any further cardiac workup at this point. -Please note that aspirin has been discontinued due to ongoing GI bleeding.  Acute blood loss anemia -Hemoglobin has dropped to 8.7 today, given her history of coronary artery disease and chest pain will be transfused 1 unit of PRBCs today, with goal to keep hemoglobin above 9.  Lower GI bleed -Status post colonoscopy today with findings of 2 nonbleeding colonic angiodysplastic lesions at the before meals ascending colon, also diverticulosis in the sigmoid colon without stigmata of active bleed but felt by Dr. Laural Golden to be the source of GI bleed.  Hypokalemia -Repleted  Code Status: Full code Family Communication: Patient only  Disposition Plan: Anticipate discharge home in 24 hours   Consultants:  GI   Antibiotics:  None   Subjective: Feels improved, still with some blood in stools today  Objective: Filed Vitals:   04/12/15 1530 04/12/15 1535 04/12/15 1540 04/12/15 1700  BP: 123/63 128/60 135/62 134/50  Pulse: 63 60 59 63  Temp:    97.5 F (36.4 C)  TempSrc:    Oral  Resp: 15 14 14 16   Height:      Weight:      SpO2: 97% 97% 98% 94%    Intake/Output Summary (Last 24 hours) at 04/12/15 1731 Last data filed at 04/12/15 1648  Gross per 24 hour  Intake 1058.83 ml  Output    300 ml  Net 758.83 ml   Filed Weights   04/11/15 1247 04/11/15 1526  Weight: 55.792 kg (123 lb) 58.695 kg (129 lb 6.4 oz)    Exam:   General:  Alert, awake, oriented 3  Cardiovascular: Regular rate and rhythm  Respiratory: Clear to auscultation bilaterally  Abdomen: Soft,  nontender, nondistended, positive bowel sounds  Extremities: No clubbing, cyanosis or edema, positive pulses   Neurologic:  Grossly intact and nonfocal  Data Reviewed: Basic Metabolic Panel:  Recent Labs Lab 04/07/15 0529 04/08/15 0614 04/09/15 0607 04/11/15 1304 04/11/15 1616 04/12/15 0507  NA 141 140 142 140  --  144  K 3.8 3.6 3.5 2.9*  --  4.1  CL 108 107 110 108  --  115*  CO2 24 26 26 25   --  22  GLUCOSE 109* 102* 95 100*  --  83  BUN 20 16 11 16   --  11  CREATININE 0.79 0.79 0.70 0.81  --  0.67  CALCIUM 8.1* 7.8* 7.7* 8.1*  --  7.7*  MG  --   --   --   --  2.2  --    Liver Function Tests:  Recent Labs Lab 04/06/15 1250 04/07/15 0529 04/11/15 1304 04/12/15 0507  AST 22 20 24 21   ALT 15 16 16 14   ALKPHOS 44 37* 38 30*  BILITOT 0.4 0.4 0.4 0.4  PROT 5.7* 5.6* 5.5* 4.7*  ALBUMIN 3.5 3.4* 3.4* 2.9*   No results for input(s): LIPASE, AMYLASE in the last 168 hours. No results for input(s): AMMONIA in the last 168 hours. CBC:  Recent Labs Lab 04/06/15 1247  04/11/15 1304 04/11/15 1616 04/11/15 2205 04/12/15 0507 04/12/15 1612  WBC 7.5  < > 8.3 9.3 7.4 6.6  10.8*  NEUTROABS 5.8  --  6.3  --   --   --   --   HGB 7.5*  < > 8.7* 9.3* 7.8* 8.1* 10.8*  HCT 22.4*  < > 26.3* 28.5* 23.3* 24.7* 32.9*  MCV 91.8  < > 87.7 89.3 87.9 89.2 89.6  PLT 261  < > 216 209 187 160 173  < > = values in this interval not displayed. Cardiac Enzymes:  Recent Labs Lab 04/07/15 2239 04/11/15 1304 04/11/15 1616 04/11/15 2205 04/12/15 0507  TROPONINI <0.03 <0.03 <0.03 0.03 0.03   BNP (last 3 results)  Recent Labs  04/06/15 1250 04/11/15 1304  BNP 211.0* 310.0*    ProBNP (last 3 results) No results for input(s): PROBNP in the last 8760 hours.  CBG: No results for input(s): GLUCAP in the last 168 hours.  No results found for this or any previous visit (from the past 240 hour(s)).   Studies: Dg Chest Portable 1 View  04/11/2015  CLINICAL DATA:  Chest pain,  history of pneumonia, COPD EXAM: PORTABLE CHEST 1 VIEW COMPARISON:  04/06/2015 FINDINGS: Cardiomediastinal silhouette is stable. Status post CABG. Dual lead cardiac pacemaker is unchanged in position. No infiltrate or pulmonary edema. Hyperinflation again noted. IMPRESSION: No active disease. Status post CABG. Hyperinflation again noted. Dual lead cardiac pacemaker is unchanged in position. Electronically Signed   By: Lahoma Crocker M.D.   On: 04/11/2015 13:15   Dg Abd Portable 1v  04/11/2015  CLINICAL DATA:  Nausea and anemia EXAM: PORTABLE ABDOMEN - 1 VIEW COMPARISON:  CT abdomen and pelvis July 02, 2005 FINDINGS: There is moderate stool throughout the colon. There is no bowel dilatation or air-fluid level suggesting obstruction. No free air is seen on this supine examination. There are apparent phleboliths in the pelvis. Lung bases are clear. There are foci of atherosclerotic calcification present. IMPRESSION: No bowel obstruction or free air.  Lung bases clear. Electronically Signed   By: Lowella Grip III M.D.   On: 04/11/2015 14:56    Scheduled Meds: . sodium chloride   Intravenous Once  . atorvastatin  10 mg Oral QHS  . donepezil  10 mg Oral Daily  . isosorbide mononitrate  30 mg Oral Daily  . omega-3 acid ethyl esters  1,000 mg Oral QHS  . pantoprazole (PROTONIX) IV  40 mg Intravenous Q12H  . potassium chloride  10 mEq Oral BID   Continuous Infusions: . 0.9 % NaCl with KCl 40 mEq / L 70 mL/hr (04/12/15 0836)    Principal Problem:   Atypical chest pain Active Problems:   GERD (gastroesophageal reflux disease)   CAD (coronary artery disease)   Essential hypertension   Hypokalemia   Pacemaker   Lower GI bleed   Acute blood loss anemia   Chronic chest pain   Dementia    Time spent: 25 minutes. Greater than 50% of this time was spent in direct contact with the patient coordinating care.    Lelon Frohlich  [facility] Pager [phone]  If 7PM-7AM, please  contact night-coverage at [email or web address], password [facility] 04/12/2015, 5:31 PM  LOS: 1 day

## 2015-04-13 ENCOUNTER — Inpatient Hospital Stay (HOSPITAL_COMMUNITY): Payer: Medicare Other

## 2015-04-13 NOTE — Discharge Summary (Signed)
Physician Discharge Summary  [name] [id number] DOB: 01-Jan-1926 DOA: 04/11/2015  PCP: Purvis Kilts, MD  Admit date: 04/11/2015 Discharge date: 04/13/2015  Time spent: 45 minutes  Recommendations for Outpatient Follow-up:  -Will be discharged home today. -Advised to follow-up with primary care provider in 2 weeks.   Discharge Diagnoses:  Principal Problem:   Atypical chest pain Active Problems:   GERD (gastroesophageal reflux disease)   CAD (coronary artery disease)   Essential hypertension   Hypokalemia   Pacemaker   Lower GI bleed   Acute blood loss anemia   Chronic chest pain   Dementia   Discharge Condition: Stable and improved  Filed Weights   04/11/15 1247 04/11/15 1526  Weight: 55.792 kg (123 lb) 58.695 kg (129 lb 6.4 oz)    History of present illness:  As per Dr. Caryn Section on 4/4: [name] is a 82 y.o. female with a history of CAD, status post CABG 1, pacemaker in situ, chronic chest pain, GERD, and dementia. She was recently hospitalized from 04/06/2015 through 04/09/2015 for a GI bleed and acute blood loss anemia. The EGD at that time by Dr. Laural Golden, did not reveal any source of bleeding. Aspirin was stopped. The results were essentially negative. She was transfused 2 units of packed red blood cells. She also had chest pain during the previous hospitalization, but her troponin I was negative 2. The history has been provided primarily by her daughter Mrs. Manley. Accordingly, the patient has chronic chest pain, but today, the she reported worsening chest pain. The pain was sharp in nature and intermittent. It was associated with some nausea and shortness of breath, but no radiation or diaphoresis. She was given 2 sublingual nitroglycerin which did not relieve her pain. She was given 2 Tylenol which did relieve the pain a little. She did not take her PPI this morning. There has been no reported cough or pleurisy or flulike symptoms. No subjective fever  or chills. No abdominal pain vomiting, diarrhea, or pain with urination. No unilateral pain or swelling in either leg.  In the ED, the patient was afebrile and hemodynamically stable. On the floor, she is mildly tachycardic with a modestly elevated blood pressure. Her EKG is nearly unchanged revealing atrial paced complexes, and lateral ST and T-wave abnormalities. Her chest and abdominal x-rays reveal no active disease. Her lab data are significant for a serum potassium of 2.9, BNP of 310, troponin I of less than 0.03, and hemoglobin of 8.7. She is being admitted for further evaluation and management.   Hospital Course:   Atypical chest pain -Has ruled out for ACS with negative troponins and EKG without acute ischemic abnormality. -Wonder if anemia may have contributed and as such has been transfused 1 unit of PRBCs on 4/5.  -do not anticipate any further cardiac workup at this point. -Please note that aspirin has been discontinued due to ongoing GI bleeding.  Acute blood loss anemia -Due to ongoing GI bleed, hemoglobin has increased to 10.2 following transfusion of 1 unit of PRBCs.  Lower GI bleed -Status post colonoscopy on 4/5 with findings of 2 nonbleeding colonic angiodysplastic lesions at the ascending colon, also diverticulosis in the sigmoid colon without stigmata of active bleed but felt by Dr. Laural Golden to be the source of GI bleed.  Hypokalemia -Repleted  Procedures:  Colonoscopy as above   Consultations:  GI  Discharge Instructions  Discharge Instructions    Diet - low sodium heart healthy  Complete by:  As directed      Increase activity slowly    Complete by:  As directed             Medication List    TAKE these medications        acetaminophen 325 MG tablet  Commonly known as:  TYLENOL  Take 650 mg by mouth every 6 (six) hours as needed for mild pain. For pain     atorvastatin 10 MG tablet  Commonly known as:  LIPITOR  TAKE ONE TABLET ONCE DAILY       B-12 COMPLIANCE INJECTION IJ  Inject 1 Dose as directed every 30 (thirty) days.     CALTRATE 600 PLUS-VIT D PO  Take 1 tablet by mouth at bedtime.     donepezil 10 MG tablet  Commonly known as:  ARICEPT  Take 10 mg by mouth daily.     ferrous [id number] C-folic acid capsule  Commonly known as:  TRINSICON / FOLTRIN  Take 1 capsule by mouth 3 (three) times daily after meals.     ferrous sulfate 325 (65 FE) MG tablet  Take 1 tablet (325 mg total) by mouth 2 (two) times daily with a meal.     Fish Oil 1200 MG Caps  Take 1,200 mg by mouth at bedtime.     isosorbide mononitrate 30 MG 24 hr tablet  Commonly known as:  IMDUR  Take 30 mg by mouth daily.     multivitamin with minerals Tabs tablet  Take 1 tablet by mouth daily.     NITROSTAT 0.4 MG SL tablet  Generic drug:  nitroGLYCERIN  PLACE 1 TABLET UNDER THE TONGUE EVERY 5 MINUTES AS NEEDED FOR CHEST PAIN DO NOT EXCEED 3 DOSES IN 24 HOURS     omeprazole 20 MG capsule  Commonly known as:  PRILOSEC  Take 2 capsules (40 mg total) by mouth daily.     polyethylene glycol packet  Commonly known as:  MIRALAX / GLYCOLAX  Take 17 g by mouth daily.     PRESERVISION AREDS PO  Take 1 capsule by mouth 2 (two) times daily.     RECLAST 5 MG/100ML Soln injection  Generic drug:  zoledronic acid  Inject 5 mg into the vein once.     simethicone 80 MG chewable tablet  Commonly known as:  MYLICON  Chew 1 tablet (80 mg total) by mouth every 6 (six) hours as needed for flatulence.       Allergies  Allergen Reactions  . Ativan [Lorazepam]     Aggressive, hallucination  . Cephalexin Hives  . Contrast Media [Iodinated Diagnostic Agents]     Unknown  . Haldol [Haloperidol Lactate]     Aggressive, hallucination  . Iohexol     Urticaria, emesis, numbness during IVP performed at [facility] years ago  . Penicillins Hives    Has patient had a PCN reaction causing immediate rash, facial/tongue/throat swelling, SOB or  lightheadedness with hypotension: Yes Has patient had a PCN reaction causing severe rash involving mucus membranes or skin necrosis: No Has patient had a PCN reaction that required hospitalization NoNo Has patient had a PCN reaction occurring within the last 10 years: No If all of the above answers are "NO", then may proceed with Cephalosporin use.   . Shellfish Allergy Other (See Comments)    Unknown  . Sulfa Antibiotics Hives       Follow-up Information    Follow up with Purvis Kilts, MD.  Schedule an appointment as soon as possible for a visit in 2 weeks.   Specialty:  Family Medicine   Contact information:   [address] [place] [id number] [phone]        The results of significant diagnostics from this hospitalization (including imaging, microbiology, ancillary and laboratory) are listed below for reference.    Significant Diagnostic Studies: Ct Angio Chest Pe W/cm &/or Wo Cm  04/06/2015  CLINICAL DATA:  Sudden onset of chest pain, shortness of breath, cough EXAM: CT ANGIOGRAPHY CHEST WITH CONTRAST TECHNIQUE: Multidetector CT imaging of the chest was performed using the standard protocol during bolus administration of intravenous contrast. Multiplanar CT image reconstructions and MIPs were obtained to evaluate the vascular anatomy. CONTRAST:  100 cc Isovue COMPARISON:  Chest x-ray 04/06/2015 FINDINGS: Mediastinum/Lymph Nodes: 2 leads cardiac pacemaker again noted. The patient is status post median sternotomy. Heart size within normal limits. No pericardial effusion. The study is of excellent technical quality. No pulmonary embolus is noted. No aortic aneurysm. Atherosclerotic calcifications of thoracic aorta. No mediastinal hematoma or adenopathy. There is no hilar adenopathy. Lungs/Pleura: No pleural thickening or pleural effusion. Mild emphysematous changes especially in upper lobes. Small emphysematous bullae are noted in right lower lobe. No infiltrate or  pulmonary edema. No focal consolidation. There is no pneumothorax. Bilateral apical scarring. Mild hyperinflation. Upper abdomen: The visualized upper abdomen shows a probable cyst in right hepatic dome measures 1.6 cm. No adrenal gland mass is noted. Musculoskeletal: No destructive rib lesions are noted. Sagittal images of the spine shows no destructive bony lesions. Alignment, disc spaces and vertebral body heights are preserved. Review of the MIP images confirms the above findings. IMPRESSION: 1. No pulmonary embolus. 2. Dual lead cardiac pacemaker in place. 3. No acute infiltrate or pulmonary edema. Mild emphysematous changes. 4. No mediastinal hematoma or adenopathy. Electronically Signed   By: Lahoma Crocker M.D.   On: 04/06/2015 16:42   US Venous Img Lower Bilateral  04/13/2015  CLINICAL DATA:  82 year old female with a history of atypical chest pain and lower extremity pain. EXAM: BILATERAL LOWER EXTREMITY VENOUS DOPPLER ULTRASOUND TECHNIQUE: Gray-scale sonography with graded compression, as well as color Doppler and duplex ultrasound were performed to evaluate the lower extremity deep venous systems from the level of the common femoral vein and including the common femoral, femoral, profunda femoral, popliteal and calf veins including the posterior tibial, peroneal and gastrocnemius veins when visible. The superficial great saphenous vein was also interrogated. Spectral Doppler was utilized to evaluate flow at rest and with distal augmentation maneuvers in the common femoral, femoral and popliteal veins. COMPARISON:  01/31/2011 FINDINGS: RIGHT LOWER EXTREMITY Common Femoral Vein: No evidence of thrombus. Normal compressibility, respiratory phasicity and response to augmentation. Saphenofemoral Junction: No evidence of thrombus. Normal compressibility and flow on color Doppler imaging. Profunda Femoral Vein: No evidence of thrombus. Normal compressibility and flow on color Doppler imaging. Femoral Vein: No  evidence of thrombus. Normal compressibility, respiratory phasicity and response to augmentation. Popliteal Vein: No evidence of thrombus. Normal compressibility, respiratory phasicity and response to augmentation. Calf Veins: No evidence of thrombus. Normal compressibility and flow on color Doppler imaging. Superficial Great Saphenous Vein: No evidence of thrombus. Normal compressibility and flow on color Doppler imaging. Other Findings:  None. LEFT LOWER EXTREMITY Common Femoral Vein: No evidence of thrombus. Normal compressibility, respiratory phasicity and response to augmentation. Saphenofemoral Junction: No evidence of thrombus. Normal compressibility and flow on color Doppler imaging. Profunda Femoral Vein: No evidence of thrombus.  Normal compressibility and flow on color Doppler imaging. Femoral Vein: No evidence of thrombus. Normal compressibility, respiratory phasicity and response to augmentation. Popliteal Vein: No evidence of thrombus. Normal compressibility, respiratory phasicity and response to augmentation. Calf Veins: No evidence of thrombus. Normal compressibility and flow on color Doppler imaging. Superficial Great Saphenous Vein: No evidence of thrombus. Normal compressibility and flow on color Doppler imaging. Other Findings:  None. IMPRESSION: Sonographic survey of the bilateral lower extremities negative for DVT. Signed, Dulcy Fanny. Earleen Newport, DO [facility] [address] [facility] Electronically Signed   By: Corrie Mckusick D.O.   On: 04/13/2015 10:04   Dg Chest Portable 1 View  04/11/2015  CLINICAL DATA:  Chest pain, history of pneumonia, COPD EXAM: PORTABLE CHEST 1 VIEW COMPARISON:  04/06/2015 FINDINGS: Cardiomediastinal silhouette is stable. Status post CABG. Dual lead cardiac pacemaker is unchanged in position. No infiltrate or pulmonary edema. Hyperinflation again noted. IMPRESSION: No active disease. Status post CABG. Hyperinflation again noted. Dual lead  cardiac pacemaker is unchanged in position. Electronically Signed   By: Lahoma Crocker M.D.   On: 04/11/2015 13:15   Dg Chest Portable 1 View  04/06/2015  CLINICAL DATA:  Anterior chest pain with shortness of breath, off and on for a long time, worse this morning. EXAM: PORTABLE CHEST 1 VIEW COMPARISON:  Chest x-ray dated 03/15/2015. FINDINGS: Lungs are again hyperexpanded indicating COPD. Lungs appear stable. No evidence of pneumonia. No pleural effusion. No pneumothorax seen. Heart size is upper normal, perhaps mild cardiomegaly, stable. Overall cardiomediastinal silhouette is stable in size and configuration. Left chest wall pacemaker/ AICD stable in position. Median sternotomy wires appear intact and stable in alignment. No acute osseous abnormality seen. IMPRESSION: 1. Hyperexpanded lungs indicating COPD/emphysema. 2. No acute findings.  No evidence of pneumonia. Electronically Signed   By: Franki Cabot M.D.   On: 04/06/2015 12:46   Dg Chest Port 1 View  03/15/2015  CLINICAL DATA:  Chest pain last night with shortness of breath. Prior smoker. EXAM: PORTABLE CHEST 1 VIEW COMPARISON:  10/14/2013 FINDINGS: The lungs are hyperinflated likely secondary to COPD. There is no focal parenchymal opacity. There is no pleural effusion or pneumothorax. The heart size is mildly enlarged. There is evidence of prior CABG. There is a dual lead AICD. The osseous structures are unremarkable. IMPRESSION: No active disease. Electronically Signed   By: Kathreen Devoid   On: 03/15/2015 10:35   Dg Abd Portable 1v  04/11/2015  CLINICAL DATA:  Nausea and anemia EXAM: PORTABLE ABDOMEN - 1 VIEW COMPARISON:  CT abdomen and pelvis July 02, 2005 FINDINGS: There is moderate stool throughout the colon. There is no bowel dilatation or air-fluid level suggesting obstruction. No free air is seen on this supine examination. There are apparent phleboliths in the pelvis. Lung bases are clear. There are foci of atherosclerotic calcification present.  IMPRESSION: No bowel obstruction or free air.  Lung bases clear. Electronically Signed   By: Lowella Grip III M.D.   On: 04/11/2015 14:56    Microbiology: No results found for this or any previous visit (from the past 240 hour(s)).   Labs: Basic Metabolic Panel:  Recent Labs Lab 04/07/15 0529 04/08/15 0614 04/09/15 0607 04/11/15 1304 04/11/15 1616 04/12/15 0507  NA 141 140 142 140  --  144  K 3.8 3.6 3.5 2.9*  --  4.1  CL 108 107 110 108  --  115*  CO2 24 26 26 25   --  22  GLUCOSE 109* 102*  95 100*  --  83  BUN 20 16 11 16   --  11  CREATININE 0.79 0.79 0.70 0.81  --  0.67  CALCIUM 8.1* 7.8* 7.7* 8.1*  --  7.7*  MG  --   --   --   --  2.2  --    Liver Function Tests:  Recent Labs Lab 04/07/15 0529 04/11/15 1304 04/12/15 0507  AST 20 24 21   ALT 16 16 14   ALKPHOS 37* 38 30*  BILITOT 0.4 0.4 0.4  PROT 5.6* 5.5* 4.7*  ALBUMIN 3.4* 3.4* 2.9*   No results for input(s): LIPASE, AMYLASE in the last 168 hours. No results for input(s): AMMONIA in the last 168 hours. CBC:  Recent Labs Lab 04/11/15 1304 04/11/15 1616 04/11/15 2205 04/12/15 0507 04/12/15 1612  WBC 8.3 9.3 7.4 6.6 10.8*  NEUTROABS 6.3  --   --   --   --   HGB 8.7* 9.3* 7.8* 8.1* 10.8*  HCT 26.3* 28.5* 23.3* 24.7* 32.9*  MCV 87.7 89.3 87.9 89.2 89.6  PLT 216 209 187 160 173   Cardiac Enzymes:  Recent Labs Lab 04/07/15 2239 04/11/15 1304 04/11/15 1616 04/11/15 2205 04/12/15 0507  TROPONINI <0.03 <0.03 <0.03 0.03 0.03   BNP: BNP (last 3 results)  Recent Labs  04/06/15 1250 04/11/15 1304  BNP 211.0* 310.0*    ProBNP (last 3 results) No results for input(s): PROBNP in the last 8760 hours.  CBG: No results for input(s): GLUCAP in the last 168 hours.     SignedLelon Frohlich  [facility] Pager: [phone] 04/13/2015, 2:27 PM

## 2015-04-13 NOTE — Consult Note (Signed)
   [facility] CM Inpatient Consult   04/13/2015  MARIAM SCHMEISER Dec 18, 1925 [id number]   Spoke with patient, spouse and daughter at bedside today regarding [facility] services. Patient and family do not want to participate with [facility] at this time, but may elect it in the future. Patient given [facility] brochure and contact information for future reference.  Of note, [facility] Care Management services would not replace or interfere with any services that are arranged by inpatient case management or social work. For additional questions or referrals please contact:  Royetta Crochet. Laymond Purser, RN, BSN, [facility] Liaison [phone]

## 2015-04-13 NOTE — Progress Notes (Signed)
Pt's IV catheter removed and intact. Pt's IV site clean dry and intact. Discharge instructions, medications and follow up appointments reviewed and discussed with patient's daughter. Pt's daughter verbalized understanding of discharge instructions including medications and follow up appointments. All questions were answered and no further questions at this time. Pt in stable condition and in no acute distress at time of discharge.Pt escorted by RN

## 2015-04-13 NOTE — Care Management (Signed)
Contacted Romualdo Bolk at [facility] to notify of patient discharge today. Resume Home Health.

## 2015-04-14 DIAGNOSIS — Z95818 Presence of other cardiac implants and grafts: Secondary | ICD-10-CM | POA: Diagnosis not present

## 2015-04-14 DIAGNOSIS — Z1389 Encounter for screening for other disorder: Secondary | ICD-10-CM | POA: Diagnosis not present

## 2015-04-14 DIAGNOSIS — Z8673 Personal history of transient ischemic attack (TIA), and cerebral infarction without residual deficits: Secondary | ICD-10-CM | POA: Diagnosis not present

## 2015-04-14 DIAGNOSIS — I2510 Atherosclerotic heart disease of native coronary artery without angina pectoris: Secondary | ICD-10-CM | POA: Diagnosis not present

## 2015-04-14 DIAGNOSIS — K219 Gastro-esophageal reflux disease without esophagitis: Secondary | ICD-10-CM | POA: Diagnosis not present

## 2015-04-14 DIAGNOSIS — E785 Hyperlipidemia, unspecified: Secondary | ICD-10-CM | POA: Diagnosis not present

## 2015-04-14 DIAGNOSIS — J449 Chronic obstructive pulmonary disease, unspecified: Secondary | ICD-10-CM | POA: Diagnosis not present

## 2015-04-14 DIAGNOSIS — D649 Anemia, unspecified: Secondary | ICD-10-CM | POA: Diagnosis not present

## 2015-04-14 DIAGNOSIS — I495 Sick sinus syndrome: Secondary | ICD-10-CM | POA: Diagnosis not present

## 2015-04-14 DIAGNOSIS — I10 Essential (primary) hypertension: Secondary | ICD-10-CM | POA: Diagnosis not present

## 2015-04-14 DIAGNOSIS — F0390 Unspecified dementia without behavioral disturbance: Secondary | ICD-10-CM | POA: Diagnosis not present

## 2015-04-14 DIAGNOSIS — Z6821 Body mass index (BMI) 21.0-21.9, adult: Secondary | ICD-10-CM | POA: Diagnosis not present

## 2015-04-14 DIAGNOSIS — K9289 Other specified diseases of the digestive system: Secondary | ICD-10-CM | POA: Diagnosis not present

## 2015-04-14 DIAGNOSIS — R079 Chest pain, unspecified: Secondary | ICD-10-CM | POA: Diagnosis not present

## 2015-04-14 DIAGNOSIS — K922 Gastrointestinal hemorrhage, unspecified: Secondary | ICD-10-CM | POA: Diagnosis not present

## 2015-04-14 LAB — TYPE AND SCREEN
ABO/RH(D): O NEG
Antibody Screen: NEGATIVE
UNIT DIVISION: 0
Unit division: 0

## 2015-04-17 DIAGNOSIS — I2510 Atherosclerotic heart disease of native coronary artery without angina pectoris: Secondary | ICD-10-CM | POA: Diagnosis not present

## 2015-04-17 DIAGNOSIS — D649 Anemia, unspecified: Secondary | ICD-10-CM | POA: Diagnosis not present

## 2015-04-17 DIAGNOSIS — K922 Gastrointestinal hemorrhage, unspecified: Secondary | ICD-10-CM | POA: Diagnosis not present

## 2015-04-17 DIAGNOSIS — Z95818 Presence of other cardiac implants and grafts: Secondary | ICD-10-CM | POA: Diagnosis not present

## 2015-04-17 DIAGNOSIS — I495 Sick sinus syndrome: Secondary | ICD-10-CM | POA: Diagnosis not present

## 2015-04-17 DIAGNOSIS — E785 Hyperlipidemia, unspecified: Secondary | ICD-10-CM | POA: Diagnosis not present

## 2015-04-17 DIAGNOSIS — J449 Chronic obstructive pulmonary disease, unspecified: Secondary | ICD-10-CM | POA: Diagnosis not present

## 2015-04-17 DIAGNOSIS — I10 Essential (primary) hypertension: Secondary | ICD-10-CM | POA: Diagnosis not present

## 2015-04-17 DIAGNOSIS — K219 Gastro-esophageal reflux disease without esophagitis: Secondary | ICD-10-CM | POA: Diagnosis not present

## 2015-04-17 DIAGNOSIS — Z8673 Personal history of transient ischemic attack (TIA), and cerebral infarction without residual deficits: Secondary | ICD-10-CM | POA: Diagnosis not present

## 2015-04-18 ENCOUNTER — Encounter (HOSPITAL_COMMUNITY): Payer: Self-pay | Admitting: Internal Medicine

## 2015-04-18 DIAGNOSIS — Z95818 Presence of other cardiac implants and grafts: Secondary | ICD-10-CM | POA: Diagnosis not present

## 2015-04-18 DIAGNOSIS — D649 Anemia, unspecified: Secondary | ICD-10-CM | POA: Diagnosis not present

## 2015-04-18 DIAGNOSIS — I2510 Atherosclerotic heart disease of native coronary artery without angina pectoris: Secondary | ICD-10-CM | POA: Diagnosis not present

## 2015-04-18 DIAGNOSIS — I10 Essential (primary) hypertension: Secondary | ICD-10-CM | POA: Diagnosis not present

## 2015-04-18 DIAGNOSIS — I495 Sick sinus syndrome: Secondary | ICD-10-CM | POA: Diagnosis not present

## 2015-04-18 DIAGNOSIS — K219 Gastro-esophageal reflux disease without esophagitis: Secondary | ICD-10-CM | POA: Diagnosis not present

## 2015-04-18 DIAGNOSIS — J449 Chronic obstructive pulmonary disease, unspecified: Secondary | ICD-10-CM | POA: Diagnosis not present

## 2015-04-18 DIAGNOSIS — E785 Hyperlipidemia, unspecified: Secondary | ICD-10-CM | POA: Diagnosis not present

## 2015-04-18 DIAGNOSIS — Z8673 Personal history of transient ischemic attack (TIA), and cerebral infarction without residual deficits: Secondary | ICD-10-CM | POA: Diagnosis not present

## 2015-04-18 DIAGNOSIS — K922 Gastrointestinal hemorrhage, unspecified: Secondary | ICD-10-CM | POA: Diagnosis not present

## 2015-04-19 DIAGNOSIS — K922 Gastrointestinal hemorrhage, unspecified: Secondary | ICD-10-CM | POA: Diagnosis not present

## 2015-04-19 DIAGNOSIS — D649 Anemia, unspecified: Secondary | ICD-10-CM | POA: Diagnosis not present

## 2015-04-19 DIAGNOSIS — J449 Chronic obstructive pulmonary disease, unspecified: Secondary | ICD-10-CM | POA: Diagnosis not present

## 2015-04-19 DIAGNOSIS — Z8673 Personal history of transient ischemic attack (TIA), and cerebral infarction without residual deficits: Secondary | ICD-10-CM | POA: Diagnosis not present

## 2015-04-19 DIAGNOSIS — K219 Gastro-esophageal reflux disease without esophagitis: Secondary | ICD-10-CM | POA: Diagnosis not present

## 2015-04-19 DIAGNOSIS — I2510 Atherosclerotic heart disease of native coronary artery without angina pectoris: Secondary | ICD-10-CM | POA: Diagnosis not present

## 2015-04-19 DIAGNOSIS — I10 Essential (primary) hypertension: Secondary | ICD-10-CM | POA: Diagnosis not present

## 2015-04-19 DIAGNOSIS — I495 Sick sinus syndrome: Secondary | ICD-10-CM | POA: Diagnosis not present

## 2015-04-19 DIAGNOSIS — E785 Hyperlipidemia, unspecified: Secondary | ICD-10-CM | POA: Diagnosis not present

## 2015-04-19 DIAGNOSIS — Z95818 Presence of other cardiac implants and grafts: Secondary | ICD-10-CM | POA: Diagnosis not present

## 2015-04-20 DIAGNOSIS — I2510 Atherosclerotic heart disease of native coronary artery without angina pectoris: Secondary | ICD-10-CM | POA: Diagnosis not present

## 2015-04-20 DIAGNOSIS — Z8673 Personal history of transient ischemic attack (TIA), and cerebral infarction without residual deficits: Secondary | ICD-10-CM | POA: Diagnosis not present

## 2015-04-20 DIAGNOSIS — K922 Gastrointestinal hemorrhage, unspecified: Secondary | ICD-10-CM | POA: Diagnosis not present

## 2015-04-20 DIAGNOSIS — I10 Essential (primary) hypertension: Secondary | ICD-10-CM | POA: Diagnosis not present

## 2015-04-20 DIAGNOSIS — J449 Chronic obstructive pulmonary disease, unspecified: Secondary | ICD-10-CM | POA: Diagnosis not present

## 2015-04-20 DIAGNOSIS — Z95818 Presence of other cardiac implants and grafts: Secondary | ICD-10-CM | POA: Diagnosis not present

## 2015-04-20 DIAGNOSIS — E785 Hyperlipidemia, unspecified: Secondary | ICD-10-CM | POA: Diagnosis not present

## 2015-04-20 DIAGNOSIS — I495 Sick sinus syndrome: Secondary | ICD-10-CM | POA: Diagnosis not present

## 2015-04-20 DIAGNOSIS — D649 Anemia, unspecified: Secondary | ICD-10-CM | POA: Diagnosis not present

## 2015-04-20 DIAGNOSIS — K219 Gastro-esophageal reflux disease without esophagitis: Secondary | ICD-10-CM | POA: Diagnosis not present

## 2015-04-24 ENCOUNTER — Ambulatory Visit (INDEPENDENT_AMBULATORY_CARE_PROVIDER_SITE_OTHER): Payer: Medicare Other | Admitting: Internal Medicine

## 2015-04-24 ENCOUNTER — Encounter (INDEPENDENT_AMBULATORY_CARE_PROVIDER_SITE_OTHER): Payer: Self-pay | Admitting: Internal Medicine

## 2015-04-24 VITALS — BP 138/50 | HR 60 | Temp 97.5°F | Ht 67.0 in | Wt 124.5 lb

## 2015-04-24 DIAGNOSIS — K922 Gastrointestinal hemorrhage, unspecified: Secondary | ICD-10-CM | POA: Diagnosis not present

## 2015-04-24 LAB — CBC WITH DIFFERENTIAL/PLATELET
Basophils Absolute: 88 {cells}/uL (ref 0–200)
Basophils Relative: 2 %
Eosinophils Absolute: 132 {cells}/uL (ref 15–500)
Eosinophils Relative: 3 %
HCT: 35.2 % (ref 35.0–45.0)
Hemoglobin: 11 g/dL — ABNORMAL LOW (ref 11.7–15.5)
Lymphocytes Relative: 17 %
Lymphs Abs: 748 {cells}/uL — ABNORMAL LOW (ref 850–3900)
MCH: 28.9 pg (ref 27.0–33.0)
MCHC: 31.3 g/dL — ABNORMAL LOW (ref 32.0–36.0)
MCV: 92.6 fL (ref 80.0–100.0)
MPV: 9 fL (ref 7.5–12.5)
Monocytes Absolute: 528 {cells}/uL (ref 200–950)
Monocytes Relative: 12 %
Neutro Abs: 2904 {cells}/uL (ref 1500–7800)
Neutrophils Relative %: 66 %
Platelets: 311 K/uL (ref 140–400)
RBC: 3.8 MIL/uL (ref 3.80–5.10)
RDW: 18.1 % — ABNORMAL HIGH (ref 11.0–15.0)
WBC: 4.4 K/uL (ref 3.8–10.8)

## 2015-04-24 NOTE — Patient Instructions (Signed)
OV in 4 months 

## 2015-04-24 NOTE — Progress Notes (Signed)
Subjective:    Patient ID: [name], female    DOB: 1925/04/25, 82 y.o.   MRN: [id number]  HPI Here today for f/u. Recently admitted to [facility] 04/12/2015 for chest pain while walking with SOB. While in [facility] ED, stool noted to be marroon/black in color and guaiac positive.  Patient admitted thru the ED yesterday. She had c/o chest pain. She had c . She was discharged from [facility] on Sunday. She underwent an EGD by Dr. Rehman on 04/08/2015 for chronic blood loss, recent GI bleed. Normal esophagus. Gastric mucosal atrophy. No bleeding source identified in upper GI tract. She received 2 units of PRBCs last admission. Hemoglobin in the ED 8.7 this admission. On  04/12/2015 she underwent a colonscopy which revealed 2 non bleeding colonic angiodysplastic lesion at ascending colon. Diverticulosis in athe sigmoid colon without stigmata of active bleed, but felt to be source of GI bleed.  Patient was evaluated for GI bleed and anemia in October 2013 and underwent EGD and colonoscopy. EGD was normal and colonoscopy revealed few small left-sided AV malformations in diverticulosis. Hx of chronic chest pain. Her troponin\'s x 3 are negative this admission. Daughter states patient does not have Power of Attorney. She tells me she is doing all right. Daughter present in room. She tells me she feels a little bit weak.  She denies seeing any blood stools. Appetite is good. She has lost about 5 pounds since admission. No abdominal pain.   Review of Systems    Past Medical History  Diagnosis Date  . CAD (coronary artery disease)     CABG surgery in 06/2005 for left main disease; normal EF; negative stress nuclear 08/2008  . Cerebrovascular disease     1993-CVA; 08/2008-mild atherosclerosis without focal stenosis  . Syncope      Negative even recorder dated 3/08  . Tobacco abuse     60 pack years; continuing  . Orthostatic hypotension   . Diverticulosis   . Osteopenia   . GERD (gastroesophageal reflux disease)   .  Hypertension   . Mental disorder     mild dementia  . Anemia   . Sinus bradycardia     s/p STJ Assurity dual chamber pacemaker by Dr Allred 02/2013  . Frequent PVCs   . Family history of anesthesia complication     "makes daughter sick" (02/09/2013)  . High cholesterol   . Asthma   . Chronic bronchitis (HCC)     "haven\'t had it in last 2-3 years; used to get it q yr" (02/09/2013)  . Pneumonia     "couple times; several years ago" (02/09/2013)  . Headache(784.0)     "sometimes weekly" (02/09/2013)  . Arthritis     "fingers; back" (02/09/2013)  . Chronic back pain greater than 3 months duration     "nerve damage down into left leg" (02/09/2013)  . Frequent UTI   . Dementia     "more than mild; having more short term memory loss recently; on Aricept" (02/09/2013)  . Dizziness     chronic  . COPD (chronic obstructive pulmonary disease) (HCC)   . Stroke (HCC)     20 -30 yrs ago    Past Surgical History  Procedure Laterality Date  . Abdominal hysterectomy  1990  . Ventral hernia repair    . Appendectomy    . Tonsillectomy    . Colonoscopy  10/10/2011    Procedure: COLONOSCOPY;  Surgeon: Rogene Houston, MD;  Location: [facility];  Service:  Endoscopy;  Laterality: N/A;  1230  . Esophagogastroduodenoscopy  10/10/2011    Procedure: ESOPHAGOGASTRODUODENOSCOPY (EGD);  Surgeon: Rogene Houston, MD;  [facility];  Service: Endoscopy;  Laterality: N/A;  . Pacemaker insertion  02/09/2013    STJ Assurity pacemaker implanted by Dr Rayann Heman for symptomatic bradycardia  . Hernia repair    . Coronary artery bypass graft  2007    Left main disease  . Cardiac catheterization  2007  . Cataract extraction, bilateral    . Permanent pacemaker insertion N/A 02/09/2013    Procedure: PERMANENT PACEMAKER INSERTION;  Surgeon: Coralyn Mark, MD;  Location: [facility];  Service: Cardiovascular;  Laterality: N/A;  . Esophagogastroduodenoscopy N/A 04/08/2015    Procedure: ESOPHAGOGASTRODUODENOSCOPY (EGD);   Surgeon: Rogene Houston, MD;  [facility];  Service: Endoscopy;  Laterality: N/A;  . Colonoscopy N/A 04/12/2015    Procedure: COLONOSCOPY;  Surgeon: Rogene Houston, MD;  [facility];  Service: Endoscopy;  Laterality: N/A;    Allergies  Allergen Reactions  . Ativan [Lorazepam]     Aggressive, hallucination  . Cephalexin Hives  . Contrast Media [Iodinated Diagnostic Agents]     Unknown  . Haldol [Haloperidol Lactate]     Aggressive, hallucination  . Iohexol     Urticaria, emesis, numbness during IVP performed at [facility] years ago  . Penicillins Hives    Has patient had a PCN reaction causing immediate rash, facial/tongue/throat swelling, SOB or lightheadedness with hypotension: Yes Has patient had a PCN reaction causing severe rash involving mucus membranes or skin necrosis: No Has patient had a PCN reaction that required hospitalization NoNo Has patient had a PCN reaction occurring within the last 10 years: No If all of the above answers are "NO", then may proceed with Cephalosporin use.   . Shellfish Allergy Other (See Comments)    Unknown  . Sulfa Antibiotics Hives          Objective:   Physical ExamBlood pressure 138/50, pulse 60, temperature 97.5 F (36.4 C), height 5\' 7"  (1.702 m), weight 124 lb 8 oz (56.473 kg). Alert and oriented. Skin warm and dry. Oral mucosa is moist.   . Sclera anicteric, conjunctivae is pink. Thyroid not enlarged. No cervical lymphadenopathy. Lungs clear. Heart regular rate and rhythm.  Abdomen is soft. Bowel sounds are positive. No hepatomegaly. No abdominal masses felt. No tenderness.  No edema to lower extremities.          Assessment & Plan:  Lower GI bleed felt to be diverticular in nature. No bleeding since. CBC. Today.  OV in 4 months.

## 2015-04-25 ENCOUNTER — Ambulatory Visit (INDEPENDENT_AMBULATORY_CARE_PROVIDER_SITE_OTHER): Payer: Medicare Other | Admitting: Adult Health

## 2015-04-25 ENCOUNTER — Encounter: Payer: Self-pay | Admitting: Adult Health

## 2015-04-25 ENCOUNTER — Telehealth: Payer: Self-pay

## 2015-04-25 VITALS — BP 124/68 | HR 78 | Ht 66.0 in | Wt 125.0 lb

## 2015-04-25 DIAGNOSIS — I495 Sick sinus syndrome: Secondary | ICD-10-CM | POA: Diagnosis not present

## 2015-04-25 DIAGNOSIS — I10 Essential (primary) hypertension: Secondary | ICD-10-CM | POA: Diagnosis not present

## 2015-04-25 DIAGNOSIS — K922 Gastrointestinal hemorrhage, unspecified: Secondary | ICD-10-CM | POA: Diagnosis not present

## 2015-04-25 DIAGNOSIS — I2510 Atherosclerotic heart disease of native coronary artery without angina pectoris: Secondary | ICD-10-CM | POA: Diagnosis not present

## 2015-04-25 DIAGNOSIS — D500 Iron deficiency anemia secondary to blood loss (chronic): Secondary | ICD-10-CM

## 2015-04-25 DIAGNOSIS — R079 Chest pain, unspecified: Secondary | ICD-10-CM | POA: Diagnosis not present

## 2015-04-25 DIAGNOSIS — J449 Chronic obstructive pulmonary disease, unspecified: Secondary | ICD-10-CM | POA: Diagnosis not present

## 2015-04-25 DIAGNOSIS — Z95818 Presence of other cardiac implants and grafts: Secondary | ICD-10-CM | POA: Diagnosis not present

## 2015-04-25 DIAGNOSIS — E785 Hyperlipidemia, unspecified: Secondary | ICD-10-CM | POA: Diagnosis not present

## 2015-04-25 DIAGNOSIS — K219 Gastro-esophageal reflux disease without esophagitis: Secondary | ICD-10-CM | POA: Diagnosis not present

## 2015-04-25 DIAGNOSIS — D649 Anemia, unspecified: Secondary | ICD-10-CM | POA: Diagnosis not present

## 2015-04-25 DIAGNOSIS — Z8673 Personal history of transient ischemic attack (TIA), and cerebral infarction without residual deficits: Secondary | ICD-10-CM | POA: Diagnosis not present

## 2015-04-25 NOTE — Progress Notes (Signed)
Cardiology Office Note   Date:  04/25/2015   ID:  [name], DOB 05/04/1925, MRN [id number]  PCP:  Purvis Kilts, MD  Cardiologist: Woodroe Chen, NP   Chief Complaint  Patient presents with  . Chest Pain      History of Present Illness: [name] is a 82 y.o. female who presents for assessment and management of CAD, status post coronary bypass grafting, chronic dizziness, who presents to the office today with complaints of recurrent chest pain.  Since being seen last she was hospitalized again for diverticular bleeding and had to be hospitalized. Her aspirin was placed on hold. She is being followed by GI, very closely.  She is undergoing physical therapy at home, and is complaining of chest discomfort during physical therapy, when she becomes anxious, with associated increased breathing. Her daughter who is with her states that her dementia has become worse, and she is often complaining of chest pain, especially when she becomes anxious. Today while having physical therapy, she was pressing down on a counter to assist herself to stand, and began to have pectoral pain across her chest. As a result of this she asked to be seen.  EKG was completed today in the office which was unchanged, she remained in sinus rhythm heart rate 89 beats per minute, with nonspecific T-wave abnormalities. She also had some PACs.   Past Medical History  Diagnosis Date  . CAD (coronary artery disease)     CABG surgery in 06/2005 for left main disease; normal EF; negative stress nuclear 08/2008  . Cerebrovascular disease     1993-CVA; 08/2008-mild atherosclerosis without focal stenosis  . Syncope      Negative even recorder dated 3/08  . Tobacco abuse     60 pack years; continuing  . Orthostatic hypotension   . Diverticulosis   . Osteopenia   . GERD (gastroesophageal reflux disease)   . Hypertension   . Mental disorder     mild dementia  . Anemia   . Sinus bradycardia      s/p STJ Assurity dual chamber pacemaker by Dr Rayann Heman 02/2013  . Frequent PVCs   . Family history of anesthesia complication     "makes daughter sick" (02/09/2013)  . High cholesterol   . Asthma   . Chronic bronchitis ([place])     "haven't had it in last 2-3 years; used to get it q yr" (02/09/2013)  . Pneumonia     "couple times; several years ago" (02/09/2013)  . [id number])     "sometimes weekly" (02/09/2013)  . Arthritis     "fingers; back" (02/09/2013)  . Chronic back pain greater than 3 months duration     "nerve damage down into left leg" (02/09/2013)  . Frequent UTI   . Dementia     "more than mild; having more short term memory loss recently; on Aricept" (02/09/2013)  . Dizziness     chronic  . COPD (chronic obstructive pulmonary disease) ([place])   . Stroke [facility])     20-30 yrs ago    Past Surgical History  Procedure Laterality Date  . Abdominal hysterectomy  1990  . Ventral hernia repair    . Appendectomy    . Tonsillectomy    . Colonoscopy  10/10/2011    Procedure: COLONOSCOPY;  Surgeon: Rogene Houston, MD;  [facility];  Service: Endoscopy;  Laterality: N/A;  1230  . Esophagogastroduodenoscopy  10/10/2011    Procedure: ESOPHAGOGASTRODUODENOSCOPY (EGD);  Surgeon:  Rogene Houston, MD;  [facility];  Service: Endoscopy;  Laterality: N/A;  . Pacemaker insertion  02/09/2013    STJ Assurity pacemaker implanted by Dr Rayann Heman for symptomatic bradycardia  . Hernia repair    . Coronary artery bypass graft  2007    Left main disease  . Cardiac catheterization  2007  . Cataract extraction, bilateral    . Permanent pacemaker insertion N/A 02/09/2013    Procedure: PERMANENT PACEMAKER INSERTION;  Surgeon: Coralyn Mark, MD;  Location: [facility];  Service: Cardiovascular;  Laterality: N/A;  . Esophagogastroduodenoscopy N/A 04/08/2015    Procedure: ESOPHAGOGASTRODUODENOSCOPY (EGD);  Surgeon: Rogene Houston, MD;  [facility];  Service: Endoscopy;  Laterality: N/A;   . Colonoscopy N/A 04/12/2015    Procedure: COLONOSCOPY;  Surgeon: Rogene Houston, MD;  [facility];  Service: Endoscopy;  Laterality: N/A;       Allergies:   Ativan; Cephalexin; Contrast media; Haldol; Iohexol; Penicillins; Shellfish allergy; and Sulfa antibiotics    Social History:  The patient  reports that she quit smoking about 8 years ago. Her smoking use included Cigarettes. She started smoking about 62 years ago. She has a 30 pack-year smoking history. She has never used smokeless tobacco. She reports that she does not drink alcohol or use illicit drugs.   Family History:  The patient's family history is not on file.    ROS: All other systems are reviewed and negative. Unless otherwise mentioned in H&P    PHYSICAL EXAM: VS:  BP 124/68 mmHg  Pulse 78  Ht 5\' 6"  (1.676 m)  Wt 125 lb (56.7 kg)  BMI 20.19 kg/m2  SpO2 98% , BMI Body mass index is 20.19 kg/(m^2). GEN: Well nourished, well developed, in no acute distress HEENT: normal Neck: no JVD, carotid bruits, or masses Cardiac: RRR; no murmurs, rubs, or gallops,no edema  Respiratory: Clear to auscultation bilaterally, normal work of breathing GI: soft, nontender, nondistended, + BS MS: no deformity or atrophyPain is reproducible with deep breaths, palpation of her left and right chest. Skin: warm and dry, no rash Neuro:  Strength and sensation are intact Psych: euthymic mood, full affect   EKG:  Sinus rhythm, PACs.heart rate of 89 beats per minute.  Recent Labs: 04/11/2015: B Natriuretic Peptide 310.0*; Magnesium 2.2; TSH 3.297 04/12/2015: ALT 14; BUN 11; Creatinine, Ser 0.67; Potassium 4.1; Sodium 144 04/24/2015: Hemoglobin 11.0*; Platelets 311    Lipid Panel    Component Value Date/Time   CHOL 144 09/20/2013 1042   TRIG 50 09/20/2013 1042   HDL 84 09/20/2013 1042   CHOLHDL 1.7 09/20/2013 1042   VLDL 10 09/20/2013 1042   LDLCALC 50 09/20/2013 1042      Wt Readings from Last 3 Encounters:   04/25/15 125 lb (56.7 kg)  04/24/15 124 lb 8 oz (56.473 kg)  04/11/15 129 lb 6.4 oz (58.695 kg)       ASSESSMENT AND PLAN:  1.  Chronic chest pain: will not make any changes in her medical regimen. I've advised her to take some Tylenol before she undergoes physical therapy. I believe this is musculoskeletal, and is reproducible with palpation of her chest. Also, the patient has had recurrent chest pain with anxiety, and worsening dementia. Heart rate and blood pressure are currently well-controlled. No changes in her regimen.  2. Anemia:being followed by GI with iron replacement. She had recent hospitalization for diverticular bleed, did receive blood transfusions. Repeat hemoglobin of 11.0 on 04/24/2015.  White blood cells 4.4. Hematocrit 35.2. We'll defer to GI for ongoing management. Agree with holding aspirin at this time.  3. Coronary artery disease: history of CABG.continue current medication regimen with statin therapy, isosorbide. Will not increase the dose at this time.  4. Worsening dementia Tthis is reported by her daughter who comes with her today. I've advised her to talk with her primary care physician to make a plan concerning skilled nursing facility placement should that become necessary in the near future for patient safety.   Current medicines are reviewed at length with the patient today.    Labs/ tests ordered today include: none  Orders Placed This Encounter  Procedures  . EKG 12-Lead     Disposition:   FU with cardiology in 6 months, and with Dr. Lovena Le tomorrow for usual pacemaker interrogation per protocol.  Signed, Jory Sims, NP  04/25/2015 3:54 PM    [facility] [address], [place], [place] [address] Phone: [phone]; Fax: [phone]

## 2015-04-25 NOTE — Patient Instructions (Signed)
Your physician wants you to follow-up in: 6 Months with Dr. Koneswaran. You will receive a reminder letter in the mail two months in advance. If you don't receive a letter, please call our office to schedule the follow-up appointment.  Your physician recommends that you continue on your current medications as directed. Please refer to the Current Medication list given to you today.  If you need a refill on your cardiac medications before your next appointment, please call your pharmacy.  Thank you for choosing [facility]!   

## 2015-04-25 NOTE — Progress Notes (Deleted)
Name: [name]    DOB: 11-14-1925  Age: 82 y.o.  MR#: [id number]       PCP:  Purvis Kilts, MD      Insurance: Payor: Onnie Boer MEDICARE / Plan: [facility] MEDICARE / Product Type: *No Product type* /   CC:   No chief complaint on file.   VS Filed Vitals:   04/25/15 1427  BP: 124/68  Pulse: 78  Height: 5\' 6"  (1.676 m)  Weight: 125 lb (56.7 kg)  SpO2: 98%    Weights Current Weight  04/25/15 125 lb (56.7 kg)  04/24/15 124 lb 8 oz (56.473 kg)  04/11/15 129 lb 6.4 oz (58.695 kg)    Blood Pressure  BP Readings from Last 3 Encounters:  04/25/15 124/68  04/24/15 138/50  04/12/15 127/48     Admit date:  (Not on file) Last encounter with RMR:  03/30/2015   Allergy Ativan; Cephalexin; Contrast media; Haldol; Iohexol; Penicillins; Shellfish allergy; and Sulfa antibiotics  Current Outpatient Prescriptions  Medication Sig Dispense Refill  . acetaminophen (TYLENOL) 325 MG tablet Take 650 mg by mouth every 6 (six) hours as needed for mild pain. For pain    . atorvastatin (LIPITOR) 10 MG tablet TAKE ONE TABLET ONCE DAILY (Patient taking differently: TAKE ONE TABLET AT BEDTIME) 30 tablet 11  . busPIRone (BUSPAR) 5 MG tablet     . Calcium-Vitamin D (CALTRATE 600 PLUS-VIT D PO) Take 1 tablet by mouth at bedtime.     . Cyanocobalamin (B-12 COMPLIANCE INJECTION IJ) Inject 1 Dose as directed every 30 (thirty) days.    Marland Kitchen donepezil (ARICEPT) 10 MG tablet Take 10 mg by mouth daily.     . ferrous [id number] C-folic acid (TRINSICON / FOLTRIN) capsule Take 1 capsule by mouth 3 (three) times daily after meals.    . ferrous sulfate 325 (65 FE) MG tablet Take 1 tablet (325 mg total) by mouth 2 (two) times daily with a meal. 60 tablet 3  . isosorbide mononitrate (IMDUR) 30 MG 24 hr tablet Take 30 mg by mouth daily.     . Multiple Vitamin (MULTIVITAMIN WITH MINERALS) TABS tablet Take 1 tablet by mouth daily.    . Multiple Vitamins-Minerals (PRESERVISION AREDS PO) Take 1 capsule by  mouth 2 (two) times daily.    Marland Kitchen NITROSTAT 0.4 MG SL tablet PLACE 1 TABLET UNDER THE TONGUE EVERY 5 MINUTES AS NEEDED FOR CHEST PAIN ***DO NOT EXCEED 3 DOSES IN 24 HOURS*** (Patient taking differently: PLACE 1 TABLET UNDER THE TONGUE EVERY 5 MINUTES AS NEEDED FOR CHEST PAIN (DO NOT EXCEED 3 DOSES IN 24 HOURS)) 25 tablet 3  . Omega-3 Fatty Acids (FISH OIL) 1200 MG CAPS Take 1,200 mg by mouth at bedtime.     Marland Kitchen omeprazole (PRILOSEC) 20 MG capsule Take 2 capsules (40 mg total) by mouth daily. (Patient taking differently: Take 20 mg by mouth 2 (two) times daily before a meal. ) 90 capsule 3  . polyethylene glycol (MIRALAX / GLYCOLAX) packet Take 17 g by mouth daily. 14 each 0  . simethicone (MYLICON) 80 MG chewable tablet Chew 1 tablet (80 mg total) by mouth every 6 (six) hours as needed for flatulence. 30 tablet 0  . zoledronic acid (RECLAST) 5 MG/100ML SOLN injection Inject 5 mg into the vein once.     No current facility-administered medications for this visit.    Discontinued Meds:   There are no discontinued medications.  Patient Active Problem List   Diagnosis Date Noted  .  Acute blood loss anemia 04/11/2015  . Chronic chest pain 04/11/2015  . Dementia 04/11/2015  . Lower GI bleed 04/06/2015  . Pacemaker 05/27/2014  . Balance disorder 10/05/2013  . Dizziness of unknown cause 10/05/2013  . Difficulty in walking(719.7) 10/05/2013  . Muscle weakness (generalized) 10/05/2013  . Hypokalemia 09/21/2013  . Acute bronchitis 09/20/2013  . SIRS (systemic inflammatory response syndrome) ([place]) 09/20/2013  . Atypical chest pain 09/20/2013  . UTI (lower urinary tract infection) 09/20/2013  . Dyspnea 09/20/2013  . Anemia 02/09/2013  . Sick sinus syndrome ([place]) 01/27/2013  . Premature ventricular contraction 01/27/2013  . CAD (coronary artery disease) 01/27/2013  . Essential hypertension 01/27/2013  . Hx of falling, presenting hazards to health 12/21/2012  . Laboratory test 05/09/2011  .  Arteriosclerotic cardiovascular disease (ASCVD)   . Cerebrovascular disease   . Syncope   . Hyperlipidemia 05/07/2009  . Tobacco abuse 05/07/2009  . Orthostatic hypotension 05/07/2009  . GERD (gastroesophageal reflux disease) 09/19/2008  . Osteopenia 09/19/2008    LABS    Component Value Date/Time   NA 144 04/12/2015 0507   NA 140 04/11/2015 1304   NA 142 04/09/2015 0607   K 4.1 04/12/2015 0507   K 2.9* 04/11/2015 1304   K 3.5 04/09/2015 0607   CL 115* 04/12/2015 0507   CL 108 04/11/2015 1304   CL 110 04/09/2015 0607   CO2 22 04/12/2015 0507   CO2 25 04/11/2015 1304   CO2 26 04/09/2015 0607   GLUCOSE 83 04/12/2015 0507   GLUCOSE 100* 04/11/2015 1304   GLUCOSE 95 04/09/2015 0607   BUN 11 04/12/2015 0507   BUN 16 04/11/2015 1304   BUN 11 04/09/2015 0607   CREATININE 0.67 04/12/2015 0507   CREATININE 0.81 04/11/2015 1304   CREATININE 0.70 04/09/2015 0607   CREATININE 0.71 01/18/2013 1542   CREATININE 0.82 06/15/2010 0845   CREATININE 0.85 05/10/2010 1213   CALCIUM 7.7* 04/12/2015 0507   CALCIUM 8.1* 04/11/2015 1304   CALCIUM 7.7* 04/09/2015 0607   GFRNONAA >60 04/12/2015 0507   GFRNONAA >60 04/11/2015 1304   GFRNONAA >60 04/09/2015 0607   GFRAA >60 04/12/2015 0507   GFRAA >60 04/11/2015 1304   GFRAA >60 04/09/2015 0607   CMP     Component Value Date/Time   NA 144 04/12/2015 0507   K 4.1 04/12/2015 0507   CL 115* 04/12/2015 0507   CO2 22 04/12/2015 0507   GLUCOSE 83 04/12/2015 0507   BUN 11 04/12/2015 0507   CREATININE 0.67 04/12/2015 0507   CREATININE 0.71 01/18/2013 1542   CALCIUM 7.7* 04/12/2015 0507   PROT 4.7* 04/12/2015 0507   ALBUMIN 2.9* 04/12/2015 0507   AST 21 04/12/2015 0507   ALT 14 04/12/2015 0507   ALKPHOS 30* 04/12/2015 0507   BILITOT 0.4 04/12/2015 0507   GFRNONAA >60 04/12/2015 0507   GFRAA >60 04/12/2015 0507       Component Value Date/Time   WBC 4.4 04/24/2015 1043   WBC 10.8* 04/12/2015 1612   WBC 6.6 04/12/2015 0507   HGB  11.0* 04/24/2015 1043   HGB 10.8* 04/12/2015 1612   HGB 8.1* 04/12/2015 0507   HCT 35.2 04/24/2015 1043   HCT 32.9* 04/12/2015 1612   HCT 24.7* 04/12/2015 0507   MCV 92.6 04/24/2015 1043   MCV 89.6 04/12/2015 1612   MCV 89.2 04/12/2015 0507    Lipid Panel     Component Value Date/Time   CHOL 144 09/20/2013 1042   TRIG 50 09/20/2013 1042   HDL  84 09/20/2013 1042   CHOLHDL 1.7 09/20/2013 1042   VLDL 10 09/20/2013 1042   LDLCALC 50 09/20/2013 1042    ABG    Component Value Date/Time   PHART 7.426* 08/31/2008 1340   PCO2ART 36.3 08/31/2008 1340   PO2ART 89.6 08/31/2008 1340   HCO3 23.4 08/31/2008 1340   TCO2 20.2 08/31/2008 1340   ACIDBASEDEF 0.4 08/31/2008 1340   O2SAT 96.7 08/31/2008 1340     Lab Results  Component Value Date   TSH 3.297 04/11/2015   BNP (last 3 results)  Recent Labs  04/06/15 1250 04/11/15 1304  BNP 211.0* 310.0*    ProBNP (last 3 results) No results for input(s): PROBNP in the last 8760 hours.  Cardiac Panel (last 3 results) No results for input(s): CKTOTAL, CKMB, TROPONINI, RELINDX in the last 72 hours.  Iron/TIBC/Ferritin/ %Sat    Component Value Date/Time   IRON 9* 04/06/2015 1510   TIBC 382 04/06/2015 1510   FERRITIN 12 04/06/2015 1510   IRONPCTSAT 2* 04/06/2015 1510     EKG Orders placed or performed in visit on 04/25/15  . EKG 12-Lead     Prior Assessment and Plan Problem List as of 04/25/2015      Cardiovascular and Mediastinum   Orthostatic hypotension   Last Assessment & Plan 05/27/2014 Office Visit Written 05/27/2014 11:34 AM by Evans Lance, MD    The patients symptoms get worse when she stands up. I have asked the patient to stop Imdur.       Arteriosclerotic cardiovascular disease (ASCVD)   Last Assessment & Plan 05/27/2014 Office Visit Written 05/27/2014 11:36 AM by Evans Lance, MD    She is s/p CABG. She has had no anginal symptoms. She will use slntg rather than imdur. She is encouraged to increase her  physical activity.      Cerebrovascular disease   Last Assessment & Plan 09/02/2012 Office Visit Written 09/02/2012 12:10 PM by Yehuda Savannah, MD    Minimal carotid atherosclerosis when last assessed 8 months ago. Due to patient's advanced age, the benefit of serial testing is probably limited. I certainly would not consider repeat carotid imaging for at least 3 years.      Syncope   Last Assessment & Plan 03/06/2012 Office Visit Edited 03/07/2012 10:44 PM by Yehuda Savannah, MD    Patient continues to have dizziness, but no syncope. Symptoms have improved spontaneously. She did not fill the prescription previously given to her for meclizine and is encouraged to do so.      Sick sinus syndrome (HCC)   Premature ventricular contraction   CAD (coronary artery disease)   Essential hypertension   Last Assessment & Plan 05/27/2014 Office Visit Written 05/27/2014 11:37 AM by Evans Lance, MD    Her blood pressure is well controlled. With her orthostasis, I would prefer that she run a little higher on the systolic pressure.        Respiratory   Acute bronchitis     Digestive   GERD (gastroesophageal reflux disease)   Last Assessment & Plan 02/05/2012 Office Visit Written 02/05/2012  7:39 PM by Yehuda Savannah, MD    Patient denies a history of acid reflux or hiatal hernia, but if chest discomfort persists, a trial of PPI could be considered.      Lower GI bleed     Nervous and Auditory   Dementia     Musculoskeletal and Integument   Osteopenia     Genitourinary  UTI (lower urinary tract infection)     Other   Hyperlipidemia   Last Assessment & Plan 01/18/2013 Office Visit Written 01/18/2013  4:01 PM by Lendon Colonel, NP    She is followed by PCP Dr. Hilma Favors for labs.      Tobacco abuse   Laboratory test   Hx of falling, presenting hazards to health   Anemia   SIRS (systemic inflammatory response syndrome) (HCC)   Atypical chest pain   Dyspnea   Hypokalemia    Balance disorder   Dizziness of unknown cause   Difficulty in walking(719.7)   Muscle weakness (generalized)   Pacemaker   Last Assessment & Plan 05/27/2014 Office Visit Written 05/27/2014 11:35 AM by Evans Lance, MD    Her St. Jude DDD PM is working normally. Will recheck in several months.       Acute blood loss anemia   Chronic chest pain       Imaging: Ct Angio Chest Pe W/cm &/or Wo Cm  04/06/2015  CLINICAL DATA:  Sudden onset of chest pain, shortness of breath, cough EXAM: CT ANGIOGRAPHY CHEST WITH CONTRAST TECHNIQUE: Multidetector CT imaging of the chest was performed using the standard protocol during bolus administration of intravenous contrast. Multiplanar CT image reconstructions and MIPs were obtained to evaluate the vascular anatomy. CONTRAST:  100 cc Isovue COMPARISON:  Chest x-ray 04/06/2015 FINDINGS: Mediastinum/Lymph Nodes: 2 leads cardiac pacemaker again noted. The patient is status post median sternotomy. Heart size within normal limits. No pericardial effusion. The study is of excellent technical quality. No pulmonary embolus is noted. No aortic aneurysm. Atherosclerotic calcifications of thoracic aorta. No mediastinal hematoma or adenopathy. There is no hilar adenopathy. Lungs/Pleura: No pleural thickening or pleural effusion. Mild emphysematous changes especially in upper lobes. Small emphysematous bullae are noted in right lower lobe. No infiltrate or pulmonary edema. No focal consolidation. There is no pneumothorax. Bilateral apical scarring. Mild hyperinflation. Upper abdomen: The visualized upper abdomen shows a probable cyst in right hepatic dome measures 1.6 cm. No adrenal gland mass is noted. Musculoskeletal: No destructive rib lesions are noted. Sagittal images of the spine shows no destructive bony lesions. Alignment, disc spaces and vertebral body heights are preserved. Review of the MIP images confirms the above findings. IMPRESSION: 1. No pulmonary embolus. 2. Dual  lead cardiac pacemaker in place. 3. No acute infiltrate or pulmonary edema. Mild emphysematous changes. 4. No mediastinal hematoma or adenopathy. Electronically Signed   By: Lahoma Crocker M.D.   On: 04/06/2015 16:42   US Venous Img Lower Bilateral  04/13/2015  CLINICAL DATA:  82 year old female with a history of atypical chest pain and lower extremity pain. EXAM: BILATERAL LOWER EXTREMITY VENOUS DOPPLER ULTRASOUND TECHNIQUE: Gray-scale sonography with graded compression, as well as color Doppler and duplex ultrasound were performed to evaluate the lower extremity deep venous systems from the level of the common femoral vein and including the common femoral, femoral, profunda femoral, popliteal and calf veins including the posterior tibial, peroneal and gastrocnemius veins when visible. The superficial great saphenous vein was also interrogated. Spectral Doppler was utilized to evaluate flow at rest and with distal augmentation maneuvers in the common femoral, femoral and popliteal veins. COMPARISON:  01/31/2011 FINDINGS: RIGHT LOWER EXTREMITY Common Femoral Vein: No evidence of thrombus. Normal compressibility, respiratory phasicity and response to augmentation. Saphenofemoral Junction: No evidence of thrombus. Normal compressibility and flow on color Doppler imaging. Profunda Femoral Vein: No evidence of thrombus. Normal compressibility and flow on color Doppler imaging.  Femoral Vein: No evidence of thrombus. Normal compressibility, respiratory phasicity and response to augmentation. Popliteal Vein: No evidence of thrombus. Normal compressibility, respiratory phasicity and response to augmentation. Calf Veins: No evidence of thrombus. Normal compressibility and flow on color Doppler imaging. Superficial Great Saphenous Vein: No evidence of thrombus. Normal compressibility and flow on color Doppler imaging. Other Findings:  None. LEFT LOWER EXTREMITY Common Femoral Vein: No evidence of thrombus. Normal  compressibility, respiratory phasicity and response to augmentation. Saphenofemoral Junction: No evidence of thrombus. Normal compressibility and flow on color Doppler imaging. Profunda Femoral Vein: No evidence of thrombus. Normal compressibility and flow on color Doppler imaging. Femoral Vein: No evidence of thrombus. Normal compressibility, respiratory phasicity and response to augmentation. Popliteal Vein: No evidence of thrombus. Normal compressibility, respiratory phasicity and response to augmentation. Calf Veins: No evidence of thrombus. Normal compressibility and flow on color Doppler imaging. Superficial Great Saphenous Vein: No evidence of thrombus. Normal compressibility and flow on color Doppler imaging. Other Findings:  None. IMPRESSION: Sonographic survey of the bilateral lower extremities negative for DVT. Signed, Dulcy Fanny. Earleen Newport, DO [facility] Electronically Signed   By: Corrie Mckusick D.O.   On: 04/13/2015 10:04   Dg Chest Portable 1 View  04/11/2015  CLINICAL DATA:  Chest pain, history of pneumonia, COPD EXAM: PORTABLE CHEST 1 VIEW COMPARISON:  04/06/2015 FINDINGS: Cardiomediastinal silhouette is stable. Status post CABG. Dual lead cardiac pacemaker is unchanged in position. No infiltrate or pulmonary edema. Hyperinflation again noted. IMPRESSION: No active disease. Status post CABG. Hyperinflation again noted. Dual lead cardiac pacemaker is unchanged in position. Electronically Signed   By: Lahoma Crocker M.D.   On: 04/11/2015 13:15   Dg Chest Portable 1 View  04/06/2015  CLINICAL DATA:  Anterior chest pain with shortness of breath, off and on for a long time, worse this morning. EXAM: PORTABLE CHEST 1 VIEW COMPARISON:  Chest x-ray dated 03/15/2015. FINDINGS: Lungs are again hyperexpanded indicating COPD. Lungs appear stable. No evidence of pneumonia. No pleural effusion. No pneumothorax seen. Heart size is upper normal, perhaps mild  cardiomegaly, stable. Overall cardiomediastinal silhouette is stable in size and configuration. Left chest wall pacemaker/ AICD stable in position. Median sternotomy wires appear intact and stable in alignment. No acute osseous abnormality seen. IMPRESSION: 1. Hyperexpanded lungs indicating COPD/emphysema. 2. No acute findings.  No evidence of pneumonia. Electronically Signed   By: Franki Cabot M.D.   On: 04/06/2015 12:46   Dg Abd Portable 1v  04/11/2015  CLINICAL DATA:  Nausea and anemia EXAM: PORTABLE ABDOMEN - 1 VIEW COMPARISON:  CT abdomen and pelvis July 02, 2005 FINDINGS: There is moderate stool throughout the colon. There is no bowel dilatation or air-fluid level suggesting obstruction. No free air is seen on this supine examination. There are apparent phleboliths in the pelvis. Lung bases are clear. There are foci of atherosclerotic calcification present. IMPRESSION: No bowel obstruction or free air.  Lung bases clear. Electronically Signed   By: Lowella Grip III M.D.   On: 04/11/2015 14:56

## 2015-04-25 NOTE — Telephone Encounter (Signed)
Home health nurse called stating that the pt had complained of chest pain and tightness today, and had to take 2 nitro. She seemed to be feeling better, but I made her an appointment with Jory Sims for this afternoon. I did advise the HHN to make sure her family knew if her pain came back to take her to ED to be evaluated. They agreed.

## 2015-04-26 ENCOUNTER — Encounter: Payer: Self-pay | Admitting: Internal Medicine

## 2015-04-26 ENCOUNTER — Ambulatory Visit (INDEPENDENT_AMBULATORY_CARE_PROVIDER_SITE_OTHER): Payer: Medicare Other | Admitting: Internal Medicine

## 2015-04-26 VITALS — BP 120/74 | HR 78 | Ht 66.0 in | Wt 124.0 lb

## 2015-04-26 DIAGNOSIS — I495 Sick sinus syndrome: Secondary | ICD-10-CM

## 2015-04-26 DIAGNOSIS — Z950 Presence of cardiac pacemaker: Secondary | ICD-10-CM | POA: Diagnosis not present

## 2015-04-26 NOTE — Patient Instructions (Signed)
Your physician wants you to follow-up in: 1 Year with Dr. Taylor. You will receive a reminder letter in the mail two months in advance. If you don't receive a letter, please call our office to schedule the follow-up appointment.  Remote monitoring is used to monitor your Pacemaker of ICD from home. This monitoring reduces the number of office visits required to check your device to one time per year. It allows us to keep an eye on the functioning of your device to ensure it is working properly. You are scheduled for a device check from home on 07/25/15. You may send your transmission at any time that day. If you have a wireless device, the transmission will be sent automatically. After your physician reviews your transmission, you will receive a postcard with your next transmission date.  If you need a refill on your cardiac medications before your next appointment, please call your pharmacy.  Thank you for choosing [facility]!   

## 2015-04-26 NOTE — Progress Notes (Signed)
HPI [name] is referred today for ongoing evaluation of her PPM. She is a pleasant 82 yo woman with symptomatic sinus node dysfunction, s/p PPM insertion, along with CAD, s/p CABG. Her main complaint today is that she gets dizzy, especially when she stands up. The patient has tried multiple medications. She is on Imdur. No frank syncope. She was in the hospital with GI bleeding and chest pain several weeks ago. She has also had musculoskeletal chest pain.       Past Medical History  Diagnosis Date  . CAD (coronary artery disease)     CABG surgery in 06/2005 for left main disease; normal EF; negative stress nuclear 08/2008  . Cerebrovascular disease     1993-CVA; 08/2008-mild atherosclerosis without focal stenosis  . Syncope      Negative even recorder dated 3/08  . Tobacco abuse     60 pack years; continuing  . Orthostatic hypotension   . Diverticulosis   . Osteopenia   . GERD (gastroesophageal reflux disease)   . Hypertension   . Mental disorder     mild dementia  . Anemia   . Sinus bradycardia     s/p STJ Assurity dual chamber pacemaker by Dr Rayann Heman 02/2013  . Frequent PVCs   . Family history of anesthesia complication     "makes daughter sick" (02/09/2013)  . High cholesterol   . Asthma   . Chronic bronchitis ([place])     "haven't had it in last 2-3 years; used to get it q yr" (02/09/2013)  . Pneumonia     "couple times; several years ago" (02/09/2013)  . [id number])     "sometimes weekly" (02/09/2013)  . Arthritis     "fingers; back" (02/09/2013)  . Chronic back pain greater than 3 months duration     "nerve damage down into left leg" (02/09/2013)  . Frequent UTI   . Dementia     "more than mild; having more short term memory loss recently; on Aricept" (02/09/2013)  . Dizziness     chronic  . COPD (chronic obstructive pulmonary disease) ([place])   . Stroke ([place])     20-30 yrs ago    ROS:   All systems reviewed and negative except as noted in the HPI.   Past  Surgical History  Procedure Laterality Date  . Abdominal hysterectomy  1990  . Ventral hernia repair    . Appendectomy    . Tonsillectomy    . Colonoscopy  10/10/2011    Procedure: COLONOSCOPY;  Surgeon: Rogene Houston, MD;  [facility];  Service: Endoscopy;  Laterality: N/A;  1230  . Esophagogastroduodenoscopy  10/10/2011    Procedure: ESOPHAGOGASTRODUODENOSCOPY (EGD);  Surgeon: Rogene Houston, MD;  Location: [facility];  Service: Endoscopy;  Laterality: N/A;  . Pacemaker insertion  02/09/2013    STJ Assurity pacemaker implanted by Dr Rayann Heman for symptomatic bradycardia  . Hernia repair    . Coronary artery bypass graft  2007    Left main disease  . Cardiac catheterization  2007  . Cataract extraction, bilateral    . Permanent pacemaker insertion N/A 02/09/2013    Procedure: PERMANENT PACEMAKER INSERTION;  Surgeon: Coralyn Mark, MD;  Location: [facility];  Service: Cardiovascular;  Laterality: N/A;  . Esophagogastroduodenoscopy N/A 04/08/2015    Procedure: ESOPHAGOGASTRODUODENOSCOPY (EGD);  Surgeon: Rogene Houston, MD;  [facility];  Service: Endoscopy;  Laterality: N/A;  . Colonoscopy N/A 04/12/2015  Procedure: COLONOSCOPY;  Surgeon: Rogene Houston, MD;  [facility]: [facility];  Service: Endoscopy;  Laterality: N/A;     Family History  Problem Relation Age of Onset  . Heart disease    . Arthritis    . Lung disease    . Asthma       Social History   Social History  . Marital Status: Married    Spouse Name: Percell Miller   . Number of Children: N/A  . Years of Education: 12   Occupational History  . retired    Social History Main Topics  . Smoking status: Former Smoker -- 0.50 packs/day for 60 years    Types: Cigarettes    Start date: 12/12/1952    Quit date: 10/20/2006  . Smokeless tobacco: Never Used     Comment: 02/09/2013 "quit smoking in ~ 2009"  . Alcohol Use: No  . Drug Use: No  . Sexual Activity: No   Other Topics Concern  . Not on  file   Social History Narrative   3 children    Right handed   HS    Drinks decaff     BP 120/74 mmHg  Pulse 78  Ht 5\' 6"  (1.676 m)  Wt 124 lb (56.246 kg)  BMI 20.02 kg/m2  SpO2 98%  Physical Exam:  Well appearing 82 yo woman, NAD HEENT: Unremarkable Neck:  6 cm JVD, no thyromegally Back:  No CVA tenderness Lungs:  Clear with no wheezes HEART:  Regular rate rhythm, no murmurs, no rubs, no clicks Abd:  soft, positive bowel sounds, no organomegally, no rebound, no guarding Ext:  2 plus pulses, no edema, no cyanosis, no clubbing Skin:  No rashes no nodules Neuro:  CN II through XII intact, motor grossly intact   DEVICE  Normal device function.  See PaceArt for details.   Assess/Plan: 1. Chest pain - her symptoms are not likely cardiac in nature. She will go into watchful waiting 2. PM - her St. Jude DDD PM is working normally.  3. HTN - her blood pressure is well controlled. Will follow.  Mikle Bosworth.D.

## 2015-04-27 DIAGNOSIS — D649 Anemia, unspecified: Secondary | ICD-10-CM | POA: Diagnosis not present

## 2015-04-27 DIAGNOSIS — I495 Sick sinus syndrome: Secondary | ICD-10-CM | POA: Diagnosis not present

## 2015-04-27 DIAGNOSIS — I10 Essential (primary) hypertension: Secondary | ICD-10-CM | POA: Diagnosis not present

## 2015-04-27 DIAGNOSIS — K922 Gastrointestinal hemorrhage, unspecified: Secondary | ICD-10-CM | POA: Diagnosis not present

## 2015-04-27 DIAGNOSIS — K219 Gastro-esophageal reflux disease without esophagitis: Secondary | ICD-10-CM | POA: Diagnosis not present

## 2015-04-27 DIAGNOSIS — I2510 Atherosclerotic heart disease of native coronary artery without angina pectoris: Secondary | ICD-10-CM | POA: Diagnosis not present

## 2015-04-27 DIAGNOSIS — Z95818 Presence of other cardiac implants and grafts: Secondary | ICD-10-CM | POA: Diagnosis not present

## 2015-04-27 DIAGNOSIS — Z8673 Personal history of transient ischemic attack (TIA), and cerebral infarction without residual deficits: Secondary | ICD-10-CM | POA: Diagnosis not present

## 2015-04-27 DIAGNOSIS — J449 Chronic obstructive pulmonary disease, unspecified: Secondary | ICD-10-CM | POA: Diagnosis not present

## 2015-04-27 DIAGNOSIS — E785 Hyperlipidemia, unspecified: Secondary | ICD-10-CM | POA: Diagnosis not present

## 2015-05-01 DIAGNOSIS — D649 Anemia, unspecified: Secondary | ICD-10-CM | POA: Diagnosis not present

## 2015-05-01 DIAGNOSIS — E785 Hyperlipidemia, unspecified: Secondary | ICD-10-CM | POA: Diagnosis not present

## 2015-05-01 DIAGNOSIS — K922 Gastrointestinal hemorrhage, unspecified: Secondary | ICD-10-CM | POA: Diagnosis not present

## 2015-05-01 DIAGNOSIS — Z8673 Personal history of transient ischemic attack (TIA), and cerebral infarction without residual deficits: Secondary | ICD-10-CM | POA: Diagnosis not present

## 2015-05-01 DIAGNOSIS — K219 Gastro-esophageal reflux disease without esophagitis: Secondary | ICD-10-CM | POA: Diagnosis not present

## 2015-05-01 DIAGNOSIS — I10 Essential (primary) hypertension: Secondary | ICD-10-CM | POA: Diagnosis not present

## 2015-05-01 DIAGNOSIS — I2510 Atherosclerotic heart disease of native coronary artery without angina pectoris: Secondary | ICD-10-CM | POA: Diagnosis not present

## 2015-05-01 DIAGNOSIS — I495 Sick sinus syndrome: Secondary | ICD-10-CM | POA: Diagnosis not present

## 2015-05-01 DIAGNOSIS — Z95818 Presence of other cardiac implants and grafts: Secondary | ICD-10-CM | POA: Diagnosis not present

## 2015-05-01 DIAGNOSIS — J449 Chronic obstructive pulmonary disease, unspecified: Secondary | ICD-10-CM | POA: Diagnosis not present

## 2015-05-02 DIAGNOSIS — I10 Essential (primary) hypertension: Secondary | ICD-10-CM | POA: Diagnosis not present

## 2015-05-02 DIAGNOSIS — I2510 Atherosclerotic heart disease of native coronary artery without angina pectoris: Secondary | ICD-10-CM | POA: Diagnosis not present

## 2015-05-02 DIAGNOSIS — K219 Gastro-esophageal reflux disease without esophagitis: Secondary | ICD-10-CM | POA: Diagnosis not present

## 2015-05-02 DIAGNOSIS — Z8673 Personal history of transient ischemic attack (TIA), and cerebral infarction without residual deficits: Secondary | ICD-10-CM | POA: Diagnosis not present

## 2015-05-02 DIAGNOSIS — Z95818 Presence of other cardiac implants and grafts: Secondary | ICD-10-CM | POA: Diagnosis not present

## 2015-05-02 DIAGNOSIS — K922 Gastrointestinal hemorrhage, unspecified: Secondary | ICD-10-CM | POA: Diagnosis not present

## 2015-05-02 DIAGNOSIS — I495 Sick sinus syndrome: Secondary | ICD-10-CM | POA: Diagnosis not present

## 2015-05-02 DIAGNOSIS — D649 Anemia, unspecified: Secondary | ICD-10-CM | POA: Diagnosis not present

## 2015-05-02 DIAGNOSIS — E785 Hyperlipidemia, unspecified: Secondary | ICD-10-CM | POA: Diagnosis not present

## 2015-05-02 DIAGNOSIS — J449 Chronic obstructive pulmonary disease, unspecified: Secondary | ICD-10-CM | POA: Diagnosis not present

## 2015-05-04 DIAGNOSIS — I2510 Atherosclerotic heart disease of native coronary artery without angina pectoris: Secondary | ICD-10-CM | POA: Diagnosis not present

## 2015-05-04 DIAGNOSIS — J449 Chronic obstructive pulmonary disease, unspecified: Secondary | ICD-10-CM | POA: Diagnosis not present

## 2015-05-04 DIAGNOSIS — I495 Sick sinus syndrome: Secondary | ICD-10-CM | POA: Diagnosis not present

## 2015-05-04 DIAGNOSIS — K219 Gastro-esophageal reflux disease without esophagitis: Secondary | ICD-10-CM | POA: Diagnosis not present

## 2015-05-04 DIAGNOSIS — Z8673 Personal history of transient ischemic attack (TIA), and cerebral infarction without residual deficits: Secondary | ICD-10-CM | POA: Diagnosis not present

## 2015-05-04 DIAGNOSIS — K922 Gastrointestinal hemorrhage, unspecified: Secondary | ICD-10-CM | POA: Diagnosis not present

## 2015-05-04 DIAGNOSIS — I10 Essential (primary) hypertension: Secondary | ICD-10-CM | POA: Diagnosis not present

## 2015-05-04 DIAGNOSIS — Z95818 Presence of other cardiac implants and grafts: Secondary | ICD-10-CM | POA: Diagnosis not present

## 2015-05-04 DIAGNOSIS — D649 Anemia, unspecified: Secondary | ICD-10-CM | POA: Diagnosis not present

## 2015-05-04 DIAGNOSIS — E785 Hyperlipidemia, unspecified: Secondary | ICD-10-CM | POA: Diagnosis not present

## 2015-05-05 LAB — CUP PACEART INCLINIC DEVICE CHECK
Battery Remaining Longevity: 116 mo
Battery Remaining Percentage: 95 %
Date Time Interrogation Session: 20170428093717
Implantable Lead Implant Date: 20150203
Implantable Lead Location: 753859
Lead Channel Pacing Threshold Amplitude: 0.75 V
Lead Channel Pacing Threshold Amplitude: 1 V
Lead Channel Pacing Threshold Pulse Width: 0.4 ms
Lead Channel Setting Pacing Amplitude: 2 V
Lead Channel Setting Pacing Amplitude: 2.5 V
Lead Channel Setting Pacing Pulse Width: 0.4 ms
MDC IDC LEAD IMPLANT DT: 20150203
MDC IDC LEAD LOCATION: 753860
MDC IDC MSMT BATTERY VOLTAGE: 3.01 V
MDC IDC MSMT LEADCHNL RA IMPEDANCE VALUE: 480 Ohm
MDC IDC MSMT LEADCHNL RA PACING THRESHOLD PULSEWIDTH: 0.4 ms
MDC IDC MSMT LEADCHNL RA SENSING INTR AMPL: 2.1 mV
MDC IDC MSMT LEADCHNL RV IMPEDANCE VALUE: 610 Ohm
MDC IDC MSMT LEADCHNL RV SENSING INTR AMPL: 12 mV
MDC IDC PG SERIAL: 7586568
MDC IDC SET LEADCHNL RV SENSING SENSITIVITY: 2 mV
MDC IDC STAT BRADY RA PERCENT PACED: 38 %
MDC IDC STAT BRADY RV PERCENT PACED: 1 % — AB
Pulse Gen Model: 2240

## 2015-05-08 DIAGNOSIS — I495 Sick sinus syndrome: Secondary | ICD-10-CM | POA: Diagnosis not present

## 2015-05-08 DIAGNOSIS — J449 Chronic obstructive pulmonary disease, unspecified: Secondary | ICD-10-CM | POA: Diagnosis not present

## 2015-05-08 DIAGNOSIS — I10 Essential (primary) hypertension: Secondary | ICD-10-CM | POA: Diagnosis not present

## 2015-05-08 DIAGNOSIS — E785 Hyperlipidemia, unspecified: Secondary | ICD-10-CM | POA: Diagnosis not present

## 2015-05-08 DIAGNOSIS — Z95818 Presence of other cardiac implants and grafts: Secondary | ICD-10-CM | POA: Diagnosis not present

## 2015-05-08 DIAGNOSIS — K922 Gastrointestinal hemorrhage, unspecified: Secondary | ICD-10-CM | POA: Diagnosis not present

## 2015-05-08 DIAGNOSIS — I2510 Atherosclerotic heart disease of native coronary artery without angina pectoris: Secondary | ICD-10-CM | POA: Diagnosis not present

## 2015-05-08 DIAGNOSIS — K219 Gastro-esophageal reflux disease without esophagitis: Secondary | ICD-10-CM | POA: Diagnosis not present

## 2015-05-08 DIAGNOSIS — D649 Anemia, unspecified: Secondary | ICD-10-CM | POA: Diagnosis not present

## 2015-05-08 DIAGNOSIS — Z8673 Personal history of transient ischemic attack (TIA), and cerebral infarction without residual deficits: Secondary | ICD-10-CM | POA: Diagnosis not present

## 2015-05-11 DIAGNOSIS — J449 Chronic obstructive pulmonary disease, unspecified: Secondary | ICD-10-CM | POA: Diagnosis not present

## 2015-05-11 DIAGNOSIS — K922 Gastrointestinal hemorrhage, unspecified: Secondary | ICD-10-CM | POA: Diagnosis not present

## 2015-05-11 DIAGNOSIS — I10 Essential (primary) hypertension: Secondary | ICD-10-CM | POA: Diagnosis not present

## 2015-05-11 DIAGNOSIS — D649 Anemia, unspecified: Secondary | ICD-10-CM | POA: Diagnosis not present

## 2015-05-11 DIAGNOSIS — E785 Hyperlipidemia, unspecified: Secondary | ICD-10-CM | POA: Diagnosis not present

## 2015-05-11 DIAGNOSIS — K219 Gastro-esophageal reflux disease without esophagitis: Secondary | ICD-10-CM | POA: Diagnosis not present

## 2015-05-11 DIAGNOSIS — Z95818 Presence of other cardiac implants and grafts: Secondary | ICD-10-CM | POA: Diagnosis not present

## 2015-05-11 DIAGNOSIS — Z8673 Personal history of transient ischemic attack (TIA), and cerebral infarction without residual deficits: Secondary | ICD-10-CM | POA: Diagnosis not present

## 2015-05-11 DIAGNOSIS — I495 Sick sinus syndrome: Secondary | ICD-10-CM | POA: Diagnosis not present

## 2015-05-11 DIAGNOSIS — I2510 Atherosclerotic heart disease of native coronary artery without angina pectoris: Secondary | ICD-10-CM | POA: Diagnosis not present

## 2015-06-15 ENCOUNTER — Other Ambulatory Visit: Payer: Self-pay

## 2015-06-15 MED ORDER — ATORVASTATIN CALCIUM 10 MG PO TABS: 10.0000 mg | ORAL_TABLET | Freq: Every day | ORAL | Status: DC

## 2015-06-28 ENCOUNTER — Telehealth (INDEPENDENT_AMBULATORY_CARE_PROVIDER_SITE_OTHER): Payer: Self-pay | Admitting: Internal Medicine

## 2015-06-28 DIAGNOSIS — D500 Iron deficiency anemia secondary to blood loss (chronic): Secondary | ICD-10-CM

## 2015-06-28 NOTE — Telephone Encounter (Signed)
Jadie's daughter came by today. Stated her mother felt weak. She was concerned her mother may be bleeding again. I am ordering a CBC and 3 stool cards sent home with patient.

## 2015-06-29 LAB — CBC WITH DIFFERENTIAL/PLATELET
BASOS ABS: 66 {cells}/uL (ref 0–200)
Basophils Relative: 1 %
Eosinophils Absolute: 66 cells/uL (ref 15–500)
Eosinophils Relative: 1 %
HCT: 43.1 % (ref 35.0–45.0)
Hemoglobin: 14.2 g/dL (ref 11.7–15.5)
LYMPHS ABS: 858 {cells}/uL (ref 850–3900)
Lymphocytes Relative: 13 %
MCH: 30.9 pg (ref 27.0–33.0)
MCHC: 32.9 g/dL (ref 32.0–36.0)
MCV: 93.7 fL (ref 80.0–100.0)
MONO ABS: 660 {cells}/uL (ref 200–950)
MPV: 9.6 fL (ref 7.5–12.5)
Monocytes Relative: 10 %
NEUTROS PCT: 75 %
Neutro Abs: 4950 cells/uL (ref 1500–7800)
PLATELETS: 165 10*3/uL (ref 140–400)
RBC: 4.6 MIL/uL (ref 3.80–5.10)
RDW: 14.7 % (ref 11.0–15.0)
WBC: 6.6 10*3/uL (ref 3.8–10.8)

## 2015-07-05 ENCOUNTER — Telehealth (INDEPENDENT_AMBULATORY_CARE_PROVIDER_SITE_OTHER): Payer: Self-pay | Admitting: *Deleted

## 2015-07-05 NOTE — Telephone Encounter (Signed)
   Diagnosis:    Result(s)   Card 1:Negative:     Card 2:Negative:   Card 3: Negative:    Completed by: Baker Moronta <LPN   HEMOCCULT SENSA DEVELOPER: LOT#:  [id number] EXPIRATION DATE: 9-17   HEMOCCULT SENSA CARD:  LOT#:  [id number] EXPIRATION DATE: 07/18   CARD CONTROL RESULTS:  POSITIVE: Positive NEGATIVE: Negative    ADDITIONAL COMMENTS: Patient' daughter was called with results. Forwarded to Terri as review.

## 2015-07-11 ENCOUNTER — Other Ambulatory Visit (HOSPITAL_COMMUNITY): Payer: Self-pay | Admitting: Family Medicine

## 2015-07-11 DIAGNOSIS — Z1231 Encounter for screening mammogram for malignant neoplasm of breast: Secondary | ICD-10-CM

## 2015-07-12 NOTE — Telephone Encounter (Signed)
Results given to patient. No blood in her stool

## 2015-07-19 ENCOUNTER — Ambulatory Visit (HOSPITAL_COMMUNITY): Payer: Medicare Other

## 2015-07-25 ENCOUNTER — Ambulatory Visit (INDEPENDENT_AMBULATORY_CARE_PROVIDER_SITE_OTHER): Payer: Medicare Other | Admitting: *Deleted

## 2015-07-25 DIAGNOSIS — I495 Sick sinus syndrome: Secondary | ICD-10-CM

## 2015-07-25 NOTE — Progress Notes (Signed)
Remote pacemaker transmission.   

## 2015-07-27 ENCOUNTER — Encounter: Payer: Self-pay | Admitting: Cardiology

## 2015-07-28 LAB — CUP PACEART REMOTE DEVICE CHECK
Battery Remaining Longevity: 124 mo
Brady Statistic AP VS Percent: 52 %
Brady Statistic AS VP Percent: 1 %
Brady Statistic AS VS Percent: 42 %
Date Time Interrogation Session: 20170718060015
Implantable Lead Location: 753859
Lead Channel Impedance Value: 640 Ohm
Lead Channel Sensing Intrinsic Amplitude: 12 mV
Lead Channel Setting Pacing Amplitude: 2 V
Lead Channel Setting Sensing Sensitivity: 2 mV
MDC IDC LEAD IMPLANT DT: 20150203
MDC IDC LEAD IMPLANT DT: 20150203
MDC IDC LEAD LOCATION: 753860
MDC IDC MSMT BATTERY REMAINING PERCENTAGE: 95.5 %
MDC IDC MSMT BATTERY VOLTAGE: 3.01 V
MDC IDC MSMT LEADCHNL RA IMPEDANCE VALUE: 490 Ohm
MDC IDC MSMT LEADCHNL RA SENSING INTR AMPL: 3.3 mV
MDC IDC SET LEADCHNL RV PACING AMPLITUDE: 2.5 V
MDC IDC SET LEADCHNL RV PACING PULSEWIDTH: 0.4 ms
MDC IDC STAT BRADY AP VP PERCENT: 1 %
MDC IDC STAT BRADY RA PERCENT PACED: 42 %
MDC IDC STAT BRADY RV PERCENT PACED: 1 %
Pulse Gen Model: 2240
Pulse Gen Serial Number: 7586568

## 2015-08-24 ENCOUNTER — Encounter (INDEPENDENT_AMBULATORY_CARE_PROVIDER_SITE_OTHER): Payer: Self-pay | Admitting: Internal Medicine

## 2015-08-24 ENCOUNTER — Ambulatory Visit (INDEPENDENT_AMBULATORY_CARE_PROVIDER_SITE_OTHER): Payer: Medicare Other | Admitting: Internal Medicine

## 2015-08-24 VITALS — BP 140/90 | HR 56 | Temp 98.0°F | Ht 67.0 in | Wt 124.9 lb

## 2015-08-24 DIAGNOSIS — K922 Gastrointestinal hemorrhage, unspecified: Secondary | ICD-10-CM

## 2015-08-24 LAB — CBC WITH DIFFERENTIAL/PLATELET
BASOS ABS: 80 {cells}/uL (ref 0–200)
Basophils Relative: 1 %
EOS ABS: 160 {cells}/uL (ref 15–500)
EOS PCT: 2 %
HCT: 44.4 % (ref 35.0–45.0)
HEMOGLOBIN: 14.6 g/dL (ref 11.7–15.5)
LYMPHS ABS: 1040 {cells}/uL (ref 850–3900)
Lymphocytes Relative: 13 %
MCH: 30.9 pg (ref 27.0–33.0)
MCHC: 32.9 g/dL (ref 32.0–36.0)
MCV: 94.1 fL (ref 80.0–100.0)
MONOS PCT: 8 %
MPV: 9.6 fL (ref 7.5–12.5)
Monocytes Absolute: 640 cells/uL (ref 200–950)
NEUTROS ABS: 6080 {cells}/uL (ref 1500–7800)
NEUTROS PCT: 76 %
PLATELETS: 193 10*3/uL (ref 140–400)
RBC: 4.72 MIL/uL (ref 3.80–5.10)
RDW: 13.3 % (ref 11.0–15.0)
WBC: 8 10*3/uL (ref 3.8–10.8)

## 2015-08-24 NOTE — Patient Instructions (Signed)
CBC. OV in 6 months.  

## 2015-08-24 NOTE — Progress Notes (Signed)
Subjective:    Patient ID: [name], female    DOB: Jan 21, 1925, 82 y.o.   MRN: [id number]  HPI Here today for f/u. Hx of GIB. Admitted to [facility]/12/2015 for chest pain and SOB. She was noted to have a maroon/black stool and was guiaiac positive.  She had undergone an EGD by Dr. Laural Golden 04/08/2015 for chronic blood loss. Normal esophagus. Gastric mucosal atrophy. No bleeding source identified in upper GI tract. On 04/12/2015 she underwent a colonoscopy which revealed 2 non bleeding colonic angiodysplastic lesions at ascending colon. Diverticulosis in the sigmoid colon without stigmata of active bleeding, but felt to be source of GI bleed.  Patient was evaluated for GI bleed and anemia in October 2013 and underwent EGD and colonoscopy. EGD was normal and colonoscopy revealed few small left-sided AV malformations in diverticulosis. She tells me she doing good. She has brown BMs. No melena.  Her appetite is good. No weight loss. She does c/o dizziness when standing. She is not exercising.  She lives with her husband and her son lives with her.  No NSAIDS. CBC    Component Value Date/Time   WBC 6.6 06/28/2015 1206   RBC 4.60 06/28/2015 1206   HGB 14.2 06/28/2015 1206   HCT 43.1 06/28/2015 1206   PLT 165 06/28/2015 1206   MCV 93.7 06/28/2015 1206   MCH 30.9 06/28/2015 1206   MCHC 32.9 06/28/2015 1206   RDW 14.7 06/28/2015 1206   LYMPHSABS 858 06/28/2015 1206   MONOABS 660 06/28/2015 1206   EOSABS 66 06/28/2015 1206   BASOSABS 66 06/28/2015 1206     Review of Systems Past Medical History:  Diagnosis Date  . Anemia   . Arthritis    "fingers; back" (02/09/2013)  . Asthma   . CAD (coronary artery disease)    CABG surgery in 06/2005 for left main disease; normal EF; negative stress nuclear 08/2008  . Cerebrovascular disease    1993-CVA; 08/2008-mild atherosclerosis without focal stenosis  . Chronic back pain greater than 3 months duration    "nerve damage down into left leg" (02/09/2013)  .  Chronic bronchitis ([place])    "haven't had it in last 2-3 years; used to get it q yr" (02/09/2013)  . COPD (chronic obstructive pulmonary disease) ([place])   . Dementia    "more than mild; having more short term memory loss recently; on Aricept" (02/09/2013)  . Diverticulosis   . Dizziness    chronic  . Family history of anesthesia complication    "makes daughter sick" (02/09/2013)  . Frequent PVCs   . Frequent UTI   . GERD (gastroesophageal reflux disease)   . [id number]    "sometimes weekly" (02/09/2013)  . High cholesterol   . Hypertension   . Mental disorder    mild dementia  . Orthostatic hypotension   . Osteopenia   . Pneumonia    "couple times; several years ago" (02/09/2013)  . Sinus bradycardia    s/p STJ Assurity dual chamber pacemaker by Dr Rayann Heman 02/2013  . Stroke ([place])    20-30 yrs ago  . Syncope     Negative even recorder dated 3/08  . Tobacco abuse    60 pack years; continuing    Past Surgical History:  Procedure Laterality Date  . ABDOMINAL HYSTERECTOMY  1990  . APPENDECTOMY    . CARDIAC CATHETERIZATION  2007  . CATARACT EXTRACTION, BILATERAL    . COLONOSCOPY  10/10/2011   Procedure: COLONOSCOPY;  Surgeon: Rogene Houston, MD;  [facility];  Service: Endoscopy;  Laterality: N/A;  1230  . COLONOSCOPY N/A 04/12/2015   Procedure: COLONOSCOPY;  Surgeon: Rogene Houston, MD;  [facility];  Service: Endoscopy;  Laterality: N/A;  . CORONARY ARTERY BYPASS GRAFT  2007   Left main disease  . ESOPHAGOGASTRODUODENOSCOPY  10/10/2011   Procedure: ESOPHAGOGASTRODUODENOSCOPY (EGD);  Surgeon: Rogene Houston, MD;  [facility];  Service: Endoscopy;  Laterality: N/A;  . ESOPHAGOGASTRODUODENOSCOPY N/A 04/08/2015   Procedure: ESOPHAGOGASTRODUODENOSCOPY (EGD);  Surgeon: Rogene Houston, MD;  [facility];  Service: Endoscopy;  Laterality: N/A;  . HERNIA REPAIR    . PACEMAKER INSERTION  02/09/2013   STJ Assurity pacemaker implanted by Dr Rayann Heman  for symptomatic bradycardia  . PERMANENT PACEMAKER INSERTION N/A 02/09/2013   Procedure: PERMANENT PACEMAKER INSERTION;  Surgeon: Coralyn Mark, MD;  Location: [place] CATH [facility];  Service: Cardiovascular;  Laterality: N/A;  . TONSILLECTOMY    . VENTRAL HERNIA REPAIR      Allergies  Allergen Reactions  . Ativan [Lorazepam]     Aggressive, hallucination  . Cephalexin Hives  . Contrast Media [Iodinated Diagnostic Agents]     Unknown  . Haldol [Haloperidol Lactate]     Aggressive, hallucination  . Iohexol     Urticaria, emesis, numbness during IVP performed at [facility] years ago  . Penicillins Hives    Has patient had a PCN reaction causing immediate rash, facial/tongue/throat swelling, SOB or lightheadedness with hypotension: Yes Has patient had a PCN reaction causing severe rash involving mucus membranes or skin necrosis: No Has patient had a PCN reaction that required hospitalization NoNo Has patient had a PCN reaction occurring within the last 10 years: No If all of the above answers are "NO", then may proceed with Cephalosporin use.   . Shellfish Allergy Other (See Comments)    Unknown  . Sulfa Antibiotics Hives         Objective:   Physical Exam Alert and oriented. Skin warm and dry. Oral mucosa is moist.   . Sclera anicteric, conjunctivae is pink. Thyroid not enlarged. No cervical lymphadenopathy. Lungs clear. Heart regular rate and rhythm.  Abdomen is soft. Bowel sounds are positive. No hepatomegaly. No abdominal masses felt. No tenderness.  No edema to lower extremities.          Assessment & Plan:  Lower GI bleed. CBC normal in June. She denies any rectal bleeding or melena.  She seems to be doing well.  Will repeat CBC today;. OV in 6 months.

## 2015-09-05 DIAGNOSIS — H35371 Puckering of macula, right eye: Secondary | ICD-10-CM | POA: Diagnosis not present

## 2015-09-05 DIAGNOSIS — H353132 Nonexudative age-related macular degeneration, bilateral, intermediate dry stage: Secondary | ICD-10-CM | POA: Diagnosis not present

## 2015-09-05 DIAGNOSIS — H35363 Drusen (degenerative) of macula, bilateral: Secondary | ICD-10-CM | POA: Diagnosis not present

## 2015-09-05 DIAGNOSIS — H353222 Exudative age-related macular degeneration, left eye, with inactive choroidal neovascularization: Secondary | ICD-10-CM | POA: Diagnosis not present

## 2015-09-07 DIAGNOSIS — M79672 Pain in left foot: Secondary | ICD-10-CM | POA: Diagnosis not present

## 2015-09-07 DIAGNOSIS — B351 Tinea unguium: Secondary | ICD-10-CM | POA: Diagnosis not present

## 2015-09-07 DIAGNOSIS — M79671 Pain in right foot: Secondary | ICD-10-CM | POA: Diagnosis not present

## 2015-09-21 DIAGNOSIS — D225 Melanocytic nevi of trunk: Secondary | ICD-10-CM | POA: Diagnosis not present

## 2015-09-21 DIAGNOSIS — L658 Other specified nonscarring hair loss: Secondary | ICD-10-CM | POA: Diagnosis not present

## 2015-09-21 DIAGNOSIS — Z1283 Encounter for screening for malignant neoplasm of skin: Secondary | ICD-10-CM | POA: Diagnosis not present

## 2015-09-25 DIAGNOSIS — Z1389 Encounter for screening for other disorder: Secondary | ICD-10-CM | POA: Diagnosis not present

## 2015-09-25 DIAGNOSIS — I10 Essential (primary) hypertension: Secondary | ICD-10-CM | POA: Diagnosis not present

## 2015-09-25 DIAGNOSIS — N342 Other urethritis: Secondary | ICD-10-CM | POA: Diagnosis not present

## 2015-09-25 DIAGNOSIS — R350 Frequency of micturition: Secondary | ICD-10-CM | POA: Diagnosis not present

## 2015-09-25 DIAGNOSIS — Z6821 Body mass index (BMI) 21.0-21.9, adult: Secondary | ICD-10-CM | POA: Diagnosis not present

## 2015-09-25 DIAGNOSIS — R7309 Other abnormal glucose: Secondary | ICD-10-CM | POA: Diagnosis not present

## 2015-09-25 DIAGNOSIS — I2510 Atherosclerotic heart disease of native coronary artery without angina pectoris: Secondary | ICD-10-CM | POA: Diagnosis not present

## 2015-09-25 DIAGNOSIS — E538 Deficiency of other specified B group vitamins: Secondary | ICD-10-CM | POA: Diagnosis not present

## 2015-09-26 ENCOUNTER — Ambulatory Visit: Payer: Medicare Other | Admitting: Cardiovascular Disease

## 2015-09-27 DIAGNOSIS — N342 Other urethritis: Secondary | ICD-10-CM | POA: Diagnosis not present

## 2015-09-27 DIAGNOSIS — R350 Frequency of micturition: Secondary | ICD-10-CM | POA: Diagnosis not present

## 2015-10-06 ENCOUNTER — Ambulatory Visit (INDEPENDENT_AMBULATORY_CARE_PROVIDER_SITE_OTHER): Payer: Medicare Other | Admitting: Cardiovascular Disease

## 2015-10-06 ENCOUNTER — Encounter: Payer: Self-pay | Admitting: Cardiovascular Disease

## 2015-10-06 VITALS — BP 124/60 | HR 62 | Ht 64.0 in | Wt 125.0 lb

## 2015-10-06 DIAGNOSIS — R42 Dizziness and giddiness: Secondary | ICD-10-CM

## 2015-10-06 DIAGNOSIS — I10 Essential (primary) hypertension: Secondary | ICD-10-CM

## 2015-10-06 DIAGNOSIS — I495 Sick sinus syndrome: Secondary | ICD-10-CM | POA: Diagnosis not present

## 2015-10-06 DIAGNOSIS — I25118 Atherosclerotic heart disease of native coronary artery with other forms of angina pectoris: Secondary | ICD-10-CM

## 2015-10-06 DIAGNOSIS — Z950 Presence of cardiac pacemaker: Secondary | ICD-10-CM

## 2015-10-06 DIAGNOSIS — Z8719 Personal history of other diseases of the digestive system: Secondary | ICD-10-CM

## 2015-10-06 DIAGNOSIS — Z23 Encounter for immunization: Secondary | ICD-10-CM | POA: Diagnosis not present

## 2015-10-06 DIAGNOSIS — R0789 Other chest pain: Secondary | ICD-10-CM | POA: Diagnosis not present

## 2015-10-06 DIAGNOSIS — R002 Palpitations: Secondary | ICD-10-CM

## 2015-10-06 DIAGNOSIS — I2510 Atherosclerotic heart disease of native coronary artery without angina pectoris: Secondary | ICD-10-CM

## 2015-10-06 NOTE — Progress Notes (Signed)
SUBJECTIVE: The patient presents for routine follow up. She has a history of CAD and CABG. She has chronic dizziness. She has problems with short term memory as well. Her most recent nuclear stress test was in November 2013 which showed no evidence of ischemia.  She had a pacemaker placed for tachycardia-bradycardia syndrome by Dr. Rayann Heman. She also has outflow tract PVCs for which she was started on metoprolol, but this was discontinued by her PCP as it didn't make her feel well.   Echo in 01/2013 demonstrated the following:  Mild LVH with LVEF 0000000, grade 2 diastolic dysfunction. Moderate left atrial enlargement. MIldly thickened mtiral leaflets with mild, posteriorly directed mtiral regurgitation. Mildly sclerotic aortic valve with trivial aortic regurgitation. Mild RV dilatation. Mild to moderate tricuspid regurgitation with moderate pulmonary hypertension, PASP 52 mmHg.  Continues to have chest pain. Also has GERD. Not relieved with nitroglycerin.    Review of Systems: As per "subjective", otherwise negative.  Allergies  Allergen Reactions  . Ativan [Lorazepam]     Aggressive, hallucination  . Cephalexin Hives  . Contrast Media [Iodinated Diagnostic Agents]     Unknown  . Haldol [Haloperidol Lactate]     Aggressive, hallucination  . Iohexol     Urticaria, emesis, numbness during IVP performed at [facility] years ago  . Penicillins Hives    Has patient had a PCN reaction causing immediate rash, facial/tongue/throat swelling, SOB or lightheadedness with hypotension: Yes Has patient had a PCN reaction causing severe rash involving mucus membranes or skin necrosis: No Has patient had a PCN reaction that required hospitalization NoNo Has patient had a PCN reaction occurring within the last 10 years: No If all of the above answers are "NO", then may proceed with Cephalosporin use.   . Shellfish Allergy Other (See Comments)    Unknown  . Sulfa Antibiotics  Hives    Current Outpatient Prescriptions  Medication Sig Dispense Refill  . acetaminophen (TYLENOL) 325 MG tablet Take 650 mg by mouth every 6 (six) hours as needed for mild pain. For pain    . atorvastatin (LIPITOR) 10 MG tablet Take 1 tablet (10 mg total) by mouth daily. 90 tablet 3  . busPIRone (BUSPAR) 5 MG tablet     . Calcium-Vitamin D (CALTRATE 600 PLUS-VIT D PO) Take 1 tablet by mouth at bedtime.     . ciprofloxacin (CIPRO) 500 MG tablet Take 500 mg by mouth 2 (two) times daily.     . Cyanocobalamin (B-12 COMPLIANCE INJECTION IJ) Inject 1 Dose as directed every 30 (thirty) days.    Marland Kitchen donepezil (ARICEPT) 10 MG tablet Take 10 mg by mouth daily.     . ferrous [id number] C-folic acid (TRINSICON / FOLTRIN) capsule Take 1 capsule by mouth 3 (three) times daily after meals.    . ferrous sulfate 325 (65 FE) MG tablet Take 1 tablet (325 mg total) by mouth 2 (two) times daily with a meal. 60 tablet 3  . isosorbide mononitrate (IMDUR) 30 MG 24 hr tablet Take 30 mg by mouth daily.     . Multiple Vitamins-Minerals (PRESERVISION AREDS PO) Take 1 capsule by mouth 2 (two) times daily.    . Omega-3 Fatty Acids (FISH OIL) 1200 MG CAPS Take 1,200 mg by mouth at bedtime.     Marland Kitchen omeprazole (PRILOSEC) 20 MG capsule Take 2 capsules (40 mg total) by mouth daily. (Patient taking differently: Take 20 mg by mouth 2 (two) times daily before a meal. ) 90  capsule 3  . polyethylene glycol (MIRALAX / GLYCOLAX) packet Take 17 g by mouth daily. 14 each 0  . simethicone (MYLICON) 80 MG chewable tablet Chew 1 tablet (80 mg total) by mouth every 6 (six) hours as needed for flatulence. 30 tablet 0  . zoledronic acid (RECLAST) 5 MG/100ML SOLN injection Inject 5 mg into the vein once.     No current facility-administered medications for this visit.     Past Medical History:  Diagnosis Date  . Anemia   . Arthritis    "fingers; back" (02/09/2013)  . Asthma   . CAD (coronary artery disease)    CABG surgery in  06/2005 for left main disease; normal EF; negative stress nuclear 08/2008  . Cerebrovascular disease    1993-CVA; 08/2008-mild atherosclerosis without focal stenosis  . Chronic back pain greater than 3 months duration    "nerve damage down into left leg" (02/09/2013)  . Chronic bronchitis ([place])    "haven't had it in last 2-3 years; used to get it q yr" (02/09/2013)  . COPD (chronic obstructive pulmonary disease) ([place])   . Dementia    "more than mild; having more short term memory loss recently; on Aricept" (02/09/2013)  . Diverticulosis   . Dizziness    chronic  . Family history of anesthesia complication    "makes daughter sick" (02/09/2013)  . Frequent PVCs   . Frequent UTI   . GERD (gastroesophageal reflux disease)   . [id number])    "sometimes weekly" (02/09/2013)  . High cholesterol   . Hypertension   . Mental disorder    mild dementia  . Orthostatic hypotension   . Osteopenia   . Pneumonia    "couple times; several years ago" (02/09/2013)  . Sinus bradycardia    s/p STJ Assurity dual chamber pacemaker by Dr Rayann Heman 02/2013  . Stroke ([place])    20-30 yrs ago  . Syncope     Negative even recorder dated 3/08  . Tobacco abuse    60 pack years; continuing    Past Surgical History:  Procedure Laterality Date  . ABDOMINAL HYSTERECTOMY  1990  . APPENDECTOMY    . CARDIAC CATHETERIZATION  2007  . CATARACT EXTRACTION, BILATERAL    . COLONOSCOPY  10/10/2011   Procedure: COLONOSCOPY;  Surgeon: Rogene Houston, MD;  [facility];  Service: Endoscopy;  Laterality: N/A;  1230  . COLONOSCOPY N/A 04/12/2015   Procedure: COLONOSCOPY;  Surgeon: Rogene Houston, MD;  [facility];  Service: Endoscopy;  Laterality: N/A;  . CORONARY ARTERY BYPASS GRAFT  2007   Left main disease  . ESOPHAGOGASTRODUODENOSCOPY  10/10/2011   Procedure: ESOPHAGOGASTRODUODENOSCOPY (EGD);  Surgeon: Rogene Houston, MD;  [facility];  Service: Endoscopy;  Laterality: N/A;  .  ESOPHAGOGASTRODUODENOSCOPY N/A 04/08/2015   Procedure: ESOPHAGOGASTRODUODENOSCOPY (EGD);  Surgeon: Rogene Houston, MD;  [facility];  Service: Endoscopy;  Laterality: N/A;  . HERNIA REPAIR    . PACEMAKER INSERTION  02/09/2013   STJ Assurity pacemaker implanted by Dr Rayann Heman for symptomatic bradycardia  . PERMANENT PACEMAKER INSERTION N/A 02/09/2013   Procedure: PERMANENT PACEMAKER INSERTION;  Surgeon: Coralyn Mark, MD;  Location: [place] CATH LAB;  Service: Cardiovascular;  Laterality: N/A;  . TONSILLECTOMY    . VENTRAL HERNIA REPAIR      Social History   Social History  . Marital status: Married    Spouse name: Percell Miller   . Number of children: N/A  . Years of  education: 12   Occupational History  . retired    Social History Main Topics  . Smoking status: Former Smoker    Packs/day: 0.50    Years: 60.00    Types: Cigarettes    Start date: 12/12/1952    Quit date: 10/20/2006  . Smokeless tobacco: Never Used     Comment: 02/09/2013 "quit smoking in ~ 2009"  . Alcohol use No  . Drug use: No  . Sexual activity: No   Other Topics Concern  . Not on file   Social History Narrative   3 children    Right handed   HS    Drinks decaff     Vitals:   10/06/15 1512  BP: 124/60  Pulse: 62  SpO2: 99%  Weight: 125 lb (56.7 kg)  Height: 5\' 4"  (1.626 m)    PHYSICAL EXAM General: NAD HEENT: Normal. Neck: No JVD, no thyromegaly. Lungs: Clear to auscultation bilaterally with normal respiratory effort. CV: Nondisplaced PMI.  Regular rate and rhythm, normal S1/S2, no S3/S4, no murmur. No pretibial or periankle edema.  Abdomen: Soft, nontender, no distention.  Neurologic: Alert and oriented.  Psych: Normal affect. Skin: Normal. Musculoskeletal: No gross deformities.    ECG: Most recent ECG reviewed.      ASSESSMENT AND PLAN: 1. Sick sinus syndrome s/p pacemaker: Normal device function. Follows with Dr. Lovena Le.   2. CAD s/p CABG with chest pain: I will proceed with  a nuclear myocardial perfusion imaging study to evaluate for ischemia Carlton Adam). No changes to meds for now. Would consider switching Imdur to Ranexa in the future.  3. Essential HTN: Controlled. No changes.  4. Hyperlipidemia: On Lipitor and fish oil.   5. PVC's: Unable to tolerate metoprolol.   Dispo: fu 6 months.   Kate Sable, M.D., F.A.C.C.

## 2015-10-06 NOTE — Patient Instructions (Signed)
Your physician wants you to follow-up in: 6 months Dr Virgina Jock will receive a reminder letter in the mail two months in advance. If you don't receive a letter, please call our office to schedule the follow-up appointment.     Your physician has requested that you have a lexiscan myoview. For further information please visit [email or web address]. Please follow instruction sheet, as given.     Your physician recommends that you continue on your current medications as directed. Please refer to the Current Medication list given to you today.    Thank you for choosing [facility] !

## 2015-10-11 ENCOUNTER — Encounter (HOSPITAL_COMMUNITY)
Admission: RE | Admit: 2015-10-11 | Discharge: 2015-10-11 | Disposition: A | Discharge status: Home / Self Care | Payer: Medicare Other | Source: Ambulatory Visit | Attending: Cardiovascular Disease | Admitting: Cardiovascular Disease

## 2015-10-11 ENCOUNTER — Encounter (HOSPITAL_COMMUNITY): Payer: Self-pay

## 2015-10-11 ENCOUNTER — Inpatient Hospital Stay (HOSPITAL_COMMUNITY): Admission: RE | Admit: 2015-10-11 | Payer: Medicare Other | Source: Ambulatory Visit

## 2015-10-11 DIAGNOSIS — R0789 Other chest pain: Secondary | ICD-10-CM | POA: Diagnosis not present

## 2015-10-11 LAB — NM MYOCAR MULTI W/SPECT W/WALL MOTION / EF
CHL CUP NUCLEAR SRS: 0
CHL CUP NUCLEAR SSS: 0
CSEPPHR: 96 {beats}/min
LV dias vol: 51 mL (ref 46–106)
LV sys vol: 17 mL
NUC STRESS TID: 0.93
RATE: 0.93
Rest HR: 63 {beats}/min
SDS: 0

## 2015-10-11 IMAGING — CR DG CHEST 2V
2 series · 2 of 2 positions shown · non-contrast
Comparison: DG CHEST 1V PORT dated 08/14/2012

CLINICAL DATA: Pacemaker placement.

EXAM:
CHEST  2 VIEW

[w chest pa]
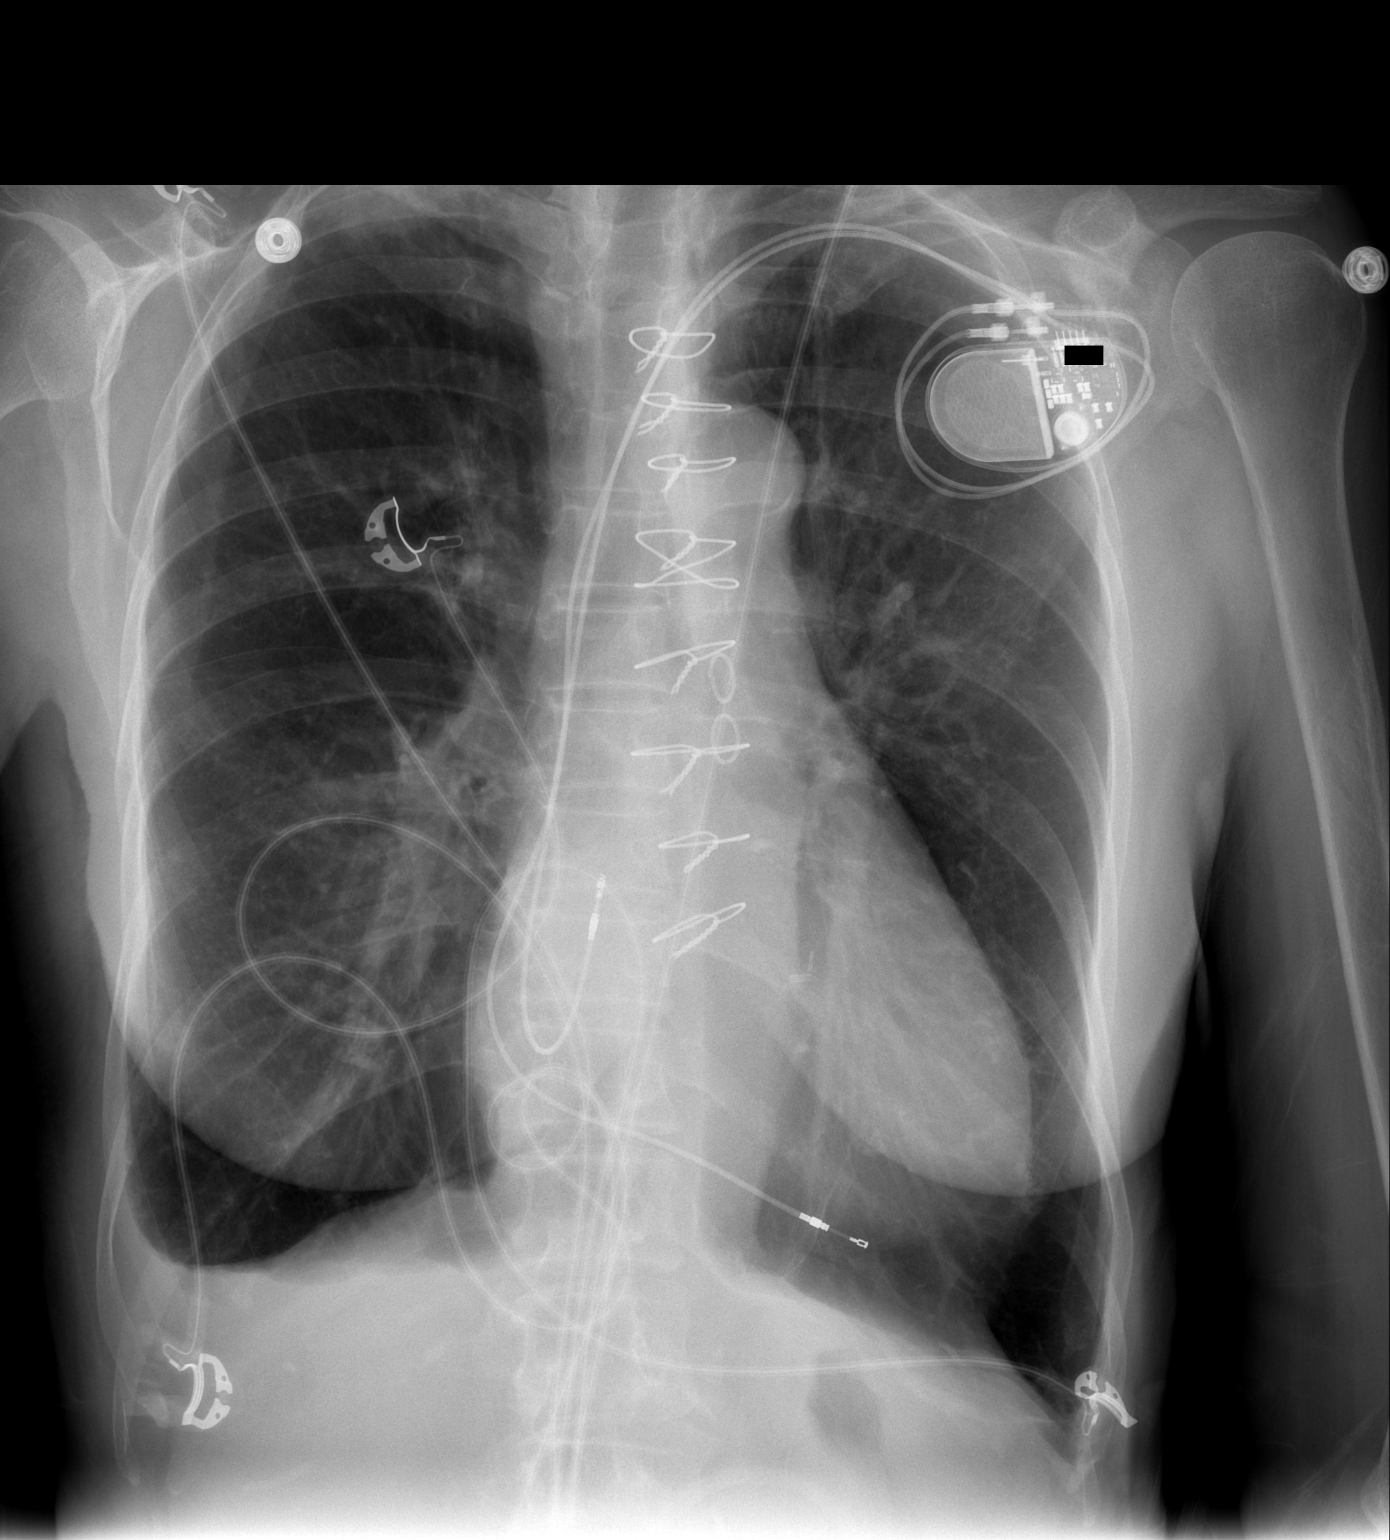

[w chest lat]
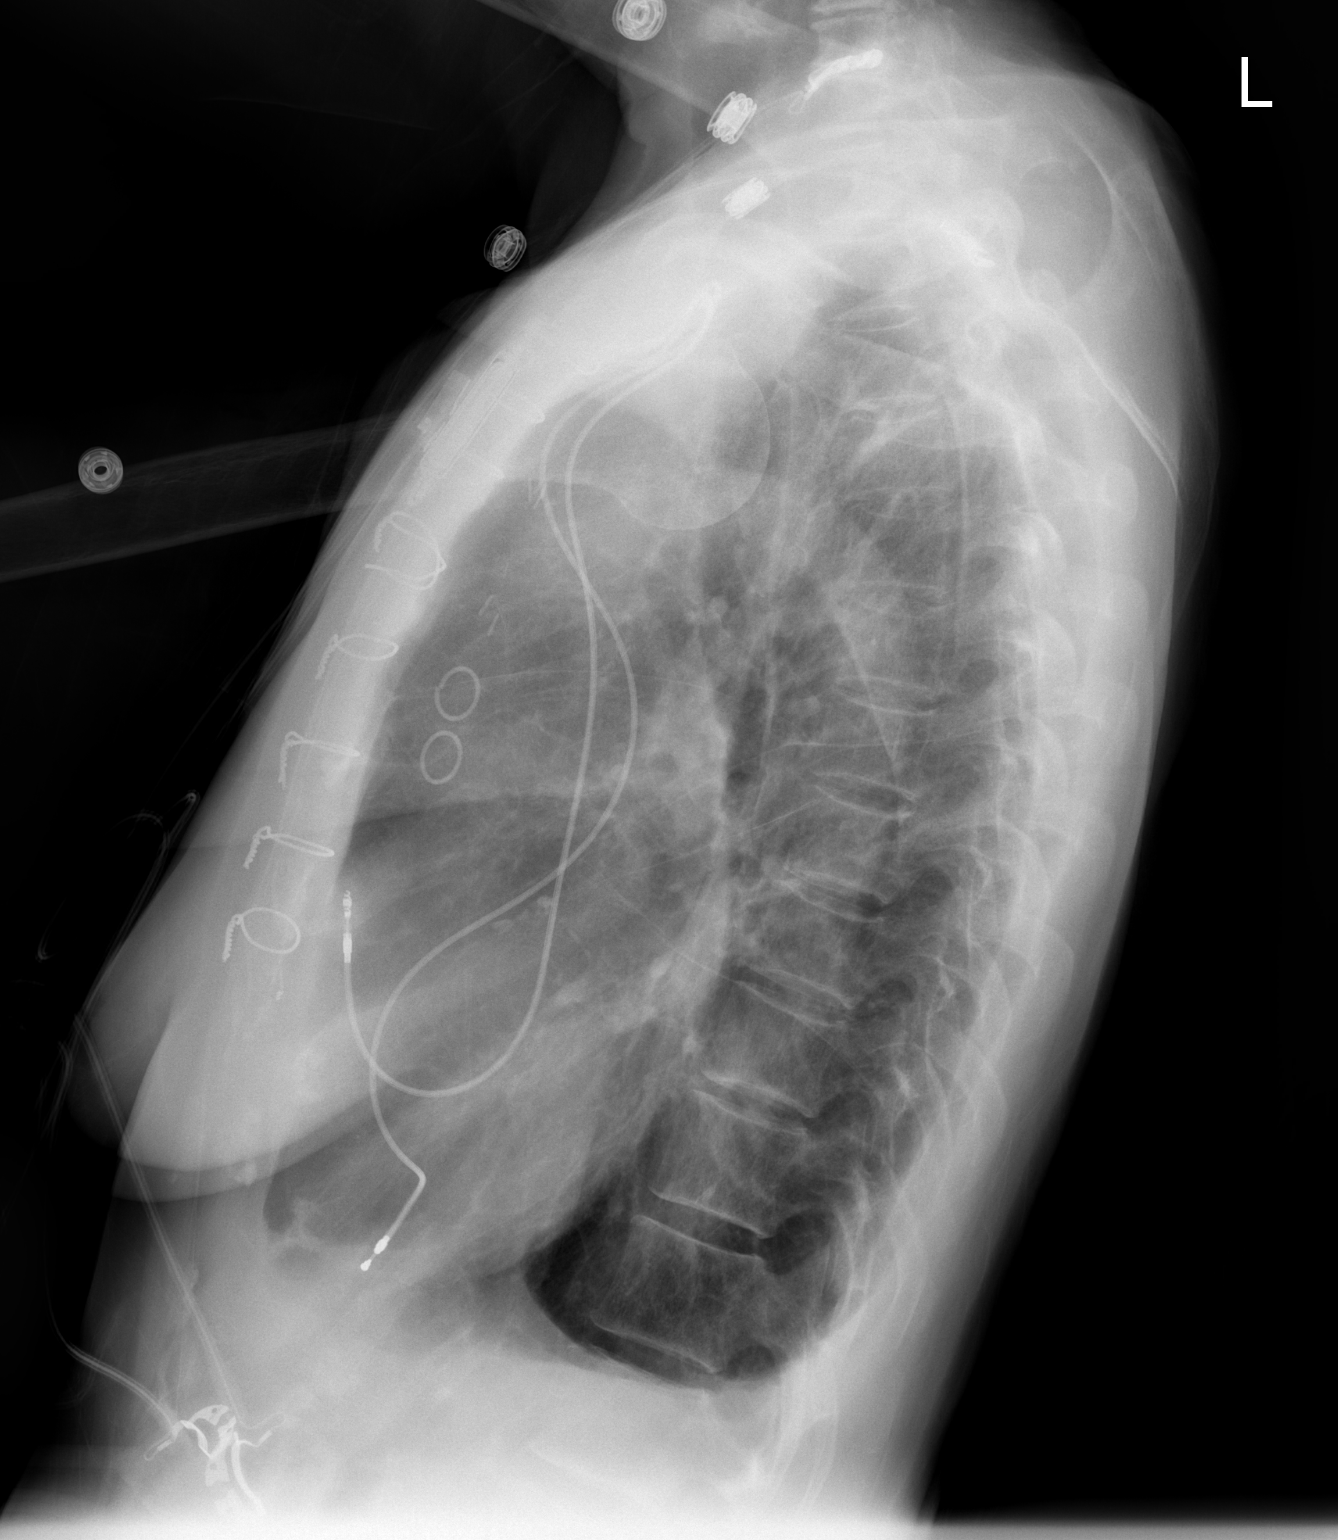

[2 of 2 positions shown; findings below may reference images not displayed]

FINDINGS: Interim placement of pacemaker, lead tips are present in the right
atrium and right ventricle ;no complicating features. Prior CABG.
Stable cardiomegaly. No pulmonary venous congestion. New right
pleural effusion. No pneumothorax. Biapical pleural thickening
consistent with scarring. Hyperexpansion of both lung fields
consistent with COPD. No focal infiltrate. No acute osseous
abnormality.
IMPRESSION: 1. Interval placement of pacemaker, lead tips are in right atrium
and right ventricle.
2. Stable cardiomegaly. No pulmonary venous congestion. Prior CABG.
3. New small right pleural effusion.
4. COPD.

## 2015-10-11 MED ORDER — REGADENOSON 0.4 MG/5ML IV SOLN
INTRAVENOUS | Status: AC
  Administered 2015-10-11: 0.4 mg via INTRAVENOUS
  Filled 2015-10-11: qty 5

## 2015-10-11 MED ORDER — NITROGLYCERIN 0.4 MG SL SUBL
SUBLINGUAL_TABLET | SUBLINGUAL | Status: AC
  Administered 2015-10-11: 0.4 mg via SUBLINGUAL
  Filled 2015-10-11: qty 1

## 2015-10-11 MED ORDER — TECHNETIUM TC 99M TETROFOSMIN IV KIT
10.0000 | PACK | Freq: Once | INTRAVENOUS | Status: AC | PRN
  Administered 2015-10-11: 11.2 via INTRAVENOUS

## 2015-10-11 MED ORDER — TECHNETIUM TC 99M TETROFOSMIN IV KIT
30.0000 | PACK | Freq: Once | INTRAVENOUS | Status: AC | PRN
  Administered 2015-10-11: 32 via INTRAVENOUS

## 2015-10-11 MED ORDER — SODIUM CHLORIDE 0.9% FLUSH
INTRAVENOUS | Status: AC
  Administered 2015-10-11: 10 mL via INTRAVENOUS
  Filled 2015-10-11: qty 10

## 2015-10-11 MED ORDER — NITROGLYCERIN 0.4 MG SL SUBL
0.4000 mg | SUBLINGUAL_TABLET | SUBLINGUAL | Status: DC | PRN
  Filled 2015-10-11: qty 25

## 2015-10-24 ENCOUNTER — Ambulatory Visit (INDEPENDENT_AMBULATORY_CARE_PROVIDER_SITE_OTHER): Payer: Medicare Other | Admitting: *Deleted

## 2015-10-24 DIAGNOSIS — I495 Sick sinus syndrome: Secondary | ICD-10-CM | POA: Diagnosis not present

## 2015-10-24 NOTE — Progress Notes (Signed)
Remote pacemaker transmission.   

## 2015-10-25 ENCOUNTER — Encounter: Payer: Self-pay | Admitting: Cardiology

## 2015-11-14 DIAGNOSIS — E876 Hypokalemia: Secondary | ICD-10-CM | POA: Diagnosis not present

## 2015-11-14 DIAGNOSIS — D510 Vitamin B12 deficiency anemia due to intrinsic factor deficiency: Secondary | ICD-10-CM | POA: Diagnosis not present

## 2015-11-22 LAB — CUP PACEART REMOTE DEVICE CHECK
Battery Voltage: 3.01 V
Brady Statistic AP VP Percent: 1 %
Brady Statistic AS VP Percent: 1 %
Brady Statistic RA Percent Paced: 46 %
Brady Statistic RV Percent Paced: 1 %
Implantable Lead Implant Date: 20150203
Implantable Lead Location: 753859
Implantable Lead Model: 1948
Implantable Pulse Generator Implant Date: 20150203
Lead Channel Impedance Value: 630 Ohm
Lead Channel Pacing Threshold Amplitude: 0.75 V
Lead Channel Pacing Threshold Pulse Width: 0.4 ms
Lead Channel Sensing Intrinsic Amplitude: 12 mV
Lead Channel Setting Pacing Amplitude: 2 V
Lead Channel Setting Sensing Sensitivity: 2 mV
MDC IDC LEAD IMPLANT DT: 20150203
MDC IDC LEAD LOCATION: 753860
MDC IDC MSMT BATTERY REMAINING LONGEVITY: 125 mo
MDC IDC MSMT BATTERY REMAINING PERCENTAGE: 95.5 %
MDC IDC MSMT LEADCHNL RA IMPEDANCE VALUE: 560 Ohm
MDC IDC MSMT LEADCHNL RA SENSING INTR AMPL: 2.1 mV
MDC IDC MSMT LEADCHNL RV PACING THRESHOLD AMPLITUDE: 1 V
MDC IDC MSMT LEADCHNL RV PACING THRESHOLD PULSEWIDTH: 0.4 ms
MDC IDC SESS DTM: 20171017060016
MDC IDC SET LEADCHNL RV PACING AMPLITUDE: 2.5 V
MDC IDC SET LEADCHNL RV PACING PULSEWIDTH: 0.4 ms
MDC IDC STAT BRADY AP VS PERCENT: 54 %
MDC IDC STAT BRADY AS VS PERCENT: 41 %
Pulse Gen Serial Number: 7586568

## 2015-12-13 DIAGNOSIS — R350 Frequency of micturition: Secondary | ICD-10-CM | POA: Diagnosis not present

## 2015-12-13 DIAGNOSIS — Z6821 Body mass index (BMI) 21.0-21.9, adult: Secondary | ICD-10-CM | POA: Diagnosis not present

## 2016-01-23 ENCOUNTER — Ambulatory Visit (INDEPENDENT_AMBULATORY_CARE_PROVIDER_SITE_OTHER): Payer: Medicare Other | Admitting: *Deleted

## 2016-01-23 DIAGNOSIS — I495 Sick sinus syndrome: Secondary | ICD-10-CM

## 2016-01-23 NOTE — Progress Notes (Signed)
Remote pacemaker transmission.   

## 2016-01-27 LAB — CUP PACEART REMOTE DEVICE CHECK
Battery Remaining Longevity: 125 mo
Battery Remaining Percentage: 95.5 %
Brady Statistic AP VS Percent: 55 %
Brady Statistic AS VP Percent: 1 %
Brady Statistic AS VS Percent: 41 %
Date Time Interrogation Session: 20180116070015
Implantable Lead Implant Date: 20150203
Implantable Lead Location: 753860
Lead Channel Impedance Value: 510 Ohm
Lead Channel Pacing Threshold Amplitude: 1 V
Lead Channel Pacing Threshold Pulse Width: 0.4 ms
Lead Channel Sensing Intrinsic Amplitude: 2 mV
Lead Channel Setting Pacing Pulse Width: 0.4 ms
MDC IDC LEAD IMPLANT DT: 20150203
MDC IDC LEAD LOCATION: 753859
MDC IDC MSMT BATTERY VOLTAGE: 3.01 V
MDC IDC MSMT LEADCHNL RA PACING THRESHOLD AMPLITUDE: 0.75 V
MDC IDC MSMT LEADCHNL RA PACING THRESHOLD PULSEWIDTH: 0.4 ms
MDC IDC MSMT LEADCHNL RV IMPEDANCE VALUE: 610 Ohm
MDC IDC MSMT LEADCHNL RV SENSING INTR AMPL: 12 mV
MDC IDC PG IMPLANT DT: 20150203
MDC IDC SET LEADCHNL RA PACING AMPLITUDE: 2 V
MDC IDC SET LEADCHNL RV PACING AMPLITUDE: 2.5 V
MDC IDC SET LEADCHNL RV SENSING SENSITIVITY: 2 mV
MDC IDC STAT BRADY AP VP PERCENT: 1 %
MDC IDC STAT BRADY RA PERCENT PACED: 47 %
MDC IDC STAT BRADY RV PERCENT PACED: 1 %
Pulse Gen Model: 2240
Pulse Gen Serial Number: 7586568

## 2016-01-31 ENCOUNTER — Encounter: Payer: Self-pay | Admitting: Cardiology

## 2016-02-19 DIAGNOSIS — R001 Bradycardia, unspecified: Secondary | ICD-10-CM | POA: Diagnosis not present

## 2016-02-19 DIAGNOSIS — E538 Deficiency of other specified B group vitamins: Secondary | ICD-10-CM | POA: Diagnosis not present

## 2016-02-19 DIAGNOSIS — M169 Osteoarthritis of hip, unspecified: Secondary | ICD-10-CM | POA: Diagnosis not present

## 2016-02-19 DIAGNOSIS — R350 Frequency of micturition: Secondary | ICD-10-CM | POA: Diagnosis not present

## 2016-02-19 DIAGNOSIS — Z6820 Body mass index (BMI) 20.0-20.9, adult: Secondary | ICD-10-CM | POA: Diagnosis not present

## 2016-02-26 ENCOUNTER — Ambulatory Visit (INDEPENDENT_AMBULATORY_CARE_PROVIDER_SITE_OTHER): Payer: Medicare Other | Admitting: Internal Medicine

## 2016-02-27 ENCOUNTER — Encounter (INDEPENDENT_AMBULATORY_CARE_PROVIDER_SITE_OTHER): Payer: Self-pay | Admitting: Internal Medicine

## 2016-02-27 ENCOUNTER — Encounter (INDEPENDENT_AMBULATORY_CARE_PROVIDER_SITE_OTHER): Payer: Self-pay

## 2016-02-27 ENCOUNTER — Ambulatory Visit (INDEPENDENT_AMBULATORY_CARE_PROVIDER_SITE_OTHER): Payer: Medicare Other | Admitting: Internal Medicine

## 2016-02-27 VITALS — BP 160/100 | HR 60 | Temp 97.4°F | Ht 64.0 in | Wt 121.0 lb

## 2016-02-27 DIAGNOSIS — K5791 Diverticulosis of intestine, part unspecified, without perforation or abscess with bleeding: Secondary | ICD-10-CM

## 2016-02-27 LAB — CBC WITH DIFFERENTIAL/PLATELET
BASOS ABS: 0 {cells}/uL (ref 0–200)
Basophils Relative: 0 %
EOS ABS: 0 {cells}/uL — AB (ref 15–500)
EOS PCT: 0 %
HEMATOCRIT: 46.7 % — AB (ref 35.0–45.0)
Hemoglobin: 15.6 g/dL — ABNORMAL HIGH (ref 11.7–15.5)
LYMPHS ABS: 1014 {cells}/uL (ref 850–3900)
LYMPHS PCT: 13 %
MCH: 31.1 pg (ref 27.0–33.0)
MCHC: 33.4 g/dL (ref 32.0–36.0)
MCV: 93.2 fL (ref 80.0–100.0)
MPV: 10.1 fL (ref 7.5–12.5)
Monocytes Absolute: 624 cells/uL (ref 200–950)
Monocytes Relative: 8 %
NEUTROS ABS: 6162 {cells}/uL (ref 1500–7800)
Neutrophils Relative %: 79 %
Platelets: 157 10*3/uL (ref 140–400)
RBC: 5.01 MIL/uL (ref 3.80–5.10)
RDW: 13.1 % (ref 11.0–15.0)
WBC: 7.8 10*3/uL (ref 3.8–10.8)

## 2016-02-27 NOTE — Patient Instructions (Signed)
CBC today. OV in one year. 

## 2016-02-27 NOTE — Progress Notes (Signed)
Subjective:    Patient ID: [name], female    DOB: [birth year], 83 y.o.   MRN: [id number]  HPI Here today for f/u. Hx of GIB.She was last seen in August of 2017. [facility] for chest pain and SOB. She was noted to have a maroon/black stool and was guiaiac positive.  She had undergone an EGD by Dr. Laural Golden 04/08/2015 for chronic blood loss. Normal esophagus. Gastric mucosal atrophy. No bleeding source identified in upper GI tract. On 04/12/2015 she underwent a colonoscopy which revealed 2 non bleeding colonic angiodysplastic lesions at ascending colon. Diverticulosis in the sigmoid colon without stigmata of active bleeding, but felt to be source of GI bleed.  Patient was evaluated for GI bleed and anemia in October 2013 and underwent EGD and colonoscopy. EGD was normal and colonoscopy revealed few small left-sided AV malformations in diverticulosis. Daughter states mother is doing good except for diarrhea. Has diarrhea about 3 times a week She has dementia. She is shuffling her feet.  She has diarrhea daily x 1, but daughter states it is better.Her appetite is not good. She has lost 3 pounds since her last visit.     CBC    Component Value Date/Time   WBC 8.0 08/24/2015 1035   RBC 4.72 08/24/2015 1035   HGB 14.6 08/24/2015 1035   HCT 44.4 08/24/2015 1035   PLT 193 08/24/2015 1035   MCV 94.1 08/24/2015 1035   MCH 30.9 08/24/2015 1035   MCHC 32.9 08/24/2015 1035   RDW 13.3 08/24/2015 1035   LYMPHSABS 1,040 08/24/2015 1035   MONOABS 640 08/24/2015 1035   EOSABS 160 08/24/2015 1035   BASOSABS 80 08/24/2015 1035    Review of Systems Past Medical History:  Diagnosis Date  . Anemia   . Arthritis    "fingers; back" (02/09/2013)  . Asthma   . CAD (coronary artery disease)    CABG surgery in 06/2005 for left main disease; normal EF; negative stress nuclear 08/2008  . Cerebrovascular disease    1993-CVA; 08/2008-mild atherosclerosis without focal stenosis  . Chronic back pain  greater than 3 months duration    "nerve damage down into left leg" (02/09/2013)  . Chronic bronchitis ([place])    "haven't had it in last 2-3 years; used to get it q yr" (02/09/2013)  . COPD (chronic obstructive pulmonary disease) ([place])   . Dementia    "more than mild; having more short term memory loss recently; on Aricept" (02/09/2013)  . Diverticulosis   . Dizziness    chronic  . Family history of anesthesia complication    "makes daughter sick" (02/09/2013)  . Frequent PVCs   . Frequent UTI   . GERD (gastroesophageal reflux disease)   . [id number])    "sometimes weekly" (02/09/2013)  . High cholesterol   . Hypertension   . Mental disorder    mild dementia  . Orthostatic hypotension   . Osteopenia   . Pneumonia    "couple times; several years ago" (02/09/2013)  . Sinus bradycardia    s/p STJ Assurity dual chamber pacemaker by Dr Rayann Heman 02/2013  . Stroke ([place])    20-30 yrs ago  . Syncope     Negative even recorder dated 3/08  . Tobacco abuse    60 pack years; continuing    Past Surgical History:  Procedure Laterality Date  . ABDOMINAL HYSTERECTOMY  1990  . APPENDECTOMY    . CARDIAC CATHETERIZATION  2007  . CATARACT EXTRACTION, BILATERAL    .  COLONOSCOPY  10/10/2011   Procedure: COLONOSCOPY;  Surgeon: Rogene Houston, MD;  [facility];  Service: Endoscopy;  Laterality: N/A;  1230  . COLONOSCOPY N/A 04/12/2015   Procedure: COLONOSCOPY;  Surgeon: Rogene Houston, MD;  [facility];  Service: Endoscopy;  Laterality: N/A;  . CORONARY ARTERY BYPASS GRAFT  2007   Left main disease  . ESOPHAGOGASTRODUODENOSCOPY  10/10/2011   Procedure: ESOPHAGOGASTRODUODENOSCOPY (EGD);  Surgeon: Rogene Houston, MD;  [facility];  Service: Endoscopy;  Laterality: N/A;  . ESOPHAGOGASTRODUODENOSCOPY N/A 04/08/2015   Procedure: ESOPHAGOGASTRODUODENOSCOPY (EGD);  Surgeon: Rogene Houston, MD;  [facility];  Service: Endoscopy;  Laterality: N/A;  . HERNIA REPAIR     . PACEMAKER INSERTION  02/09/2013   STJ Assurity pacemaker implanted by Dr Rayann Heman for symptomatic bradycardia  . PERMANENT PACEMAKER INSERTION N/A 02/09/2013   Procedure: PERMANENT PACEMAKER INSERTION;  Surgeon: Coralyn Mark, MD;  Location: [place] CATH LAB;  Service: Cardiovascular;  Laterality: N/A;  . TONSILLECTOMY    . VENTRAL HERNIA REPAIR      Allergies  Allergen Reactions  . Ativan [Lorazepam]     Aggressive, hallucination  . Cephalexin Hives  . Contrast Media [Iodinated Diagnostic Agents]     Unknown  . Haldol [Haloperidol Lactate]     Aggressive, hallucination  . Iohexol     Urticaria, emesis, numbness during IVP performed at [facility] years ago  . Penicillins Hives    Has patient had a PCN reaction causing immediate rash, facial/tongue/throat swelling, SOB or lightheadedness with hypotension: Yes Has patient had a PCN reaction causing severe rash involving mucus membranes or skin necrosis: No Has patient had a PCN reaction that required hospitalization NoNo Has patient had a PCN reaction occurring within the last 10 years: No If all of the above answers are "NO", then may proceed with Cephalosporin use.   . Shellfish Allergy Other (See Comments)    Unknown  . Sulfa Antibiotics Hives    Current Outpatient Prescriptions on File Prior to Visit  Medication Sig Dispense Refill  . acetaminophen (TYLENOL) 325 MG tablet Take 650 mg by mouth every 6 (six) hours as needed for mild pain. For pain    . atorvastatin (LIPITOR) 10 MG tablet Take 1 tablet (10 mg total) by mouth daily. 90 tablet 3  . busPIRone (BUSPAR) 5 MG tablet     . Calcium-Vitamin D (CALTRATE 600 PLUS-VIT D PO) Take 1 tablet by mouth at bedtime.     . Cyanocobalamin (B-12 COMPLIANCE INJECTION IJ) Inject 1 Dose as directed every 30 (thirty) days.    Marland Kitchen donepezil (ARICEPT) 10 MG tablet Take 10 mg by mouth daily.     . ferrous [id number] C-folic acid (TRINSICON / FOLTRIN) capsule Take 1 capsule by  mouth 3 (three) times daily after meals.    . ferrous sulfate 325 (65 FE) MG tablet Take 1 tablet (325 mg total) by mouth 2 (two) times daily with a meal. 60 tablet 3  . isosorbide mononitrate (IMDUR) 30 MG 24 hr tablet Take 30 mg by mouth daily.     . Multiple Vitamins-Minerals (PRESERVISION AREDS PO) Take 1 capsule by mouth 2 (two) times daily.    . Omega-3 Fatty Acids (FISH OIL) 1200 MG CAPS Take 1,200 mg by mouth at bedtime.     Marland Kitchen omeprazole (PRILOSEC) 20 MG capsule Take 2 capsules (40 mg total) by mouth daily. (Patient taking differently: Take 20 mg by mouth 2 (two)  times daily before a meal. ) 90 capsule 3  . simethicone (MYLICON) 80 MG chewable tablet Chew 1 tablet (80 mg total) by mouth every 6 (six) hours as needed for flatulence. 30 tablet 0  . zoledronic acid (RECLAST) 5 MG/100ML SOLN injection Inject 5 mg into the vein once.    . ciprofloxacin (CIPRO) 500 MG tablet Take 500 mg by mouth 2 (two) times daily.     . polyethylene glycol (MIRALAX / GLYCOLAX) packet Take 17 g by mouth daily. (Patient not taking: Reported on 02/27/2016) 14 each 0   No current facility-administered medications on file prior to visit.        Objective:   Physical Exam Blood pressure (!) 160/100, pulse 60, temperature 97.4 F (36.3 C), height 5\' 4"  (1.626 m), weight 121 lb (54.9 kg).  Alert and oriented. Skin warm and dry. Oral mucosa is moist.   . Sclera anicteric, conjunctivae is pink. Thyroid not enlarged. No cervical lymphadenopathy. Lungs clear. Heart regular rate and rhythm.  Abdomen is soft. Bowel sounds are positive. No hepatomegaly. No abdominal masses felt. No tenderness.  No edema to lower extremities.  .       Assessment & Plan:  Hx of GIB. Am going to get a CBC, Thyroid panel. Last hemoglobin stable.  OV in one year.  CBC today. T3 at daugher's request.

## 2016-02-28 LAB — T3: T3, Total: 108 ng/dL (ref 76–181)

## 2016-03-02 ENCOUNTER — Other Ambulatory Visit: Payer: Self-pay | Admitting: Cardiovascular Disease

## 2016-03-28 ENCOUNTER — Ambulatory Visit (INDEPENDENT_AMBULATORY_CARE_PROVIDER_SITE_OTHER): Payer: Medicare Other | Admitting: Cardiovascular Disease

## 2016-03-28 ENCOUNTER — Encounter: Payer: Self-pay | Admitting: Cardiovascular Disease

## 2016-03-28 VITALS — Ht 64.0 in | Wt 120.0 lb

## 2016-03-28 DIAGNOSIS — I25708 Atherosclerosis of coronary artery bypass graft(s), unspecified, with other forms of angina pectoris: Secondary | ICD-10-CM | POA: Diagnosis not present

## 2016-03-28 DIAGNOSIS — R002 Palpitations: Secondary | ICD-10-CM | POA: Diagnosis not present

## 2016-03-28 DIAGNOSIS — Z950 Presence of cardiac pacemaker: Secondary | ICD-10-CM | POA: Diagnosis not present

## 2016-03-28 DIAGNOSIS — I10 Essential (primary) hypertension: Secondary | ICD-10-CM | POA: Diagnosis not present

## 2016-03-28 DIAGNOSIS — Z8719 Personal history of other diseases of the digestive system: Secondary | ICD-10-CM | POA: Diagnosis not present

## 2016-03-28 DIAGNOSIS — R42 Dizziness and giddiness: Secondary | ICD-10-CM

## 2016-03-28 DIAGNOSIS — E7800 Pure hypercholesterolemia, unspecified: Secondary | ICD-10-CM

## 2016-03-28 NOTE — Progress Notes (Signed)
SUBJECTIVE: The patient presents for routine follow up. She has a history of CAD and CABG. She has chronic dizziness and chest pain. She has problems with short term memory as well. Her most recent nuclear stress test was on 10/11/15 which showed no evidence of ischemia.  She had a pacemaker placed for tachycardia-bradycardia syndrome by Dr. Rayann Heman. She also has outflow tract PVCs for which she was started on metoprolol, but this was discontinued by her PCP as it didn't make her feel well.   Echo in 01/2013 demonstrated the following:  Mild LVH with LVEF 16-38%, grade 2 diastolic dysfunction. Moderate left atrial enlargement. MIldly thickened mtiral leaflets with mild, posteriorly directed mtiral regurgitation. Mildly sclerotic aortic valve with trivial aortic regurgitation. Mild RV dilatation. Mild to moderate tricuspid regurgitation with moderate pulmonary hypertension, PASP 52 mmHg.  She has had some leg weakness. She has a history of CVA 25 years ago as per her daughter. She had a negative MRI of her brain in 2008. She recent saw a neurologist who did not feel testing was necessary.   Review of Systems: As per "subjective", otherwise negative.  Allergies  Allergen Reactions  . Ativan [Lorazepam]     Aggressive, hallucination  . Cephalexin Hives  . Contrast Media [Iodinated Diagnostic Agents]     Unknown  . Haldol [Haloperidol Lactate]     Aggressive, hallucination  . Iohexol     Urticaria, emesis, numbness during IVP performed at [facility] years ago  . Penicillins Hives    Has patient had a PCN reaction causing immediate rash, facial/tongue/throat swelling, SOB or lightheadedness with hypotension: Yes Has patient had a PCN reaction causing severe rash involving mucus membranes or skin necrosis: No Has patient had a PCN reaction that required hospitalization NoNo Has patient had a PCN reaction occurring within the last 10 years: No If all of the above  answers are "NO", then may proceed with Cephalosporin use.   . Shellfish Allergy Other (See Comments)    Unknown  . Sulfa Antibiotics Hives    Current Outpatient Prescriptions  Medication Sig Dispense Refill  . acetaminophen (TYLENOL) 325 MG tablet Take 650 mg by mouth every 6 (six) hours as needed for mild pain. For pain    . atorvastatin (LIPITOR) 10 MG tablet Take 1 tablet (10 mg total) by mouth daily. 90 tablet 3  . busPIRone (BUSPAR) 5 MG tablet     . Calcium-Vitamin D (CALTRATE 600 PLUS-VIT D PO) Take 1 tablet by mouth at bedtime.     . Cyanocobalamin (B-12 COMPLIANCE INJECTION IJ) Inject 1 Dose as directed every 30 (thirty) days.    Marland Kitchen donepezil (ARICEPT) 10 MG tablet Take 10 mg by mouth daily.     . ferrous sulfate 325 (65 FE) MG tablet Take 1 tablet (325 mg total) by mouth 2 (two) times daily with a meal. 60 tablet 3  . isosorbide mononitrate (IMDUR) 30 MG 24 hr tablet Take 30 mg by mouth daily.     . Multiple Vitamins-Minerals (PRESERVISION AREDS PO) Take 1 capsule by mouth 2 (two) times daily.    . Omega-3 Fatty Acids (FISH OIL) 1200 MG CAPS Take 1,200 mg by mouth at bedtime.     Marland Kitchen omeprazole (PRILOSEC) 20 MG capsule Take 2 capsules (40 mg total) by mouth daily. (Patient taking differently: Take 20 mg by mouth 2 (two) times daily before a meal. ) 90 capsule 3  . simethicone (MYLICON) 80 MG chewable tablet Chew 1  tablet (80 mg total) by mouth every 6 (six) hours as needed for flatulence. 30 tablet 0  . zoledronic acid (RECLAST) 5 MG/100ML SOLN injection Inject 5 mg into the vein once.     No current facility-administered medications for this visit.     Past Medical History:  Diagnosis Date  . Anemia   . Arthritis    "fingers; back" (02/09/2013)  . Asthma   . CAD (coronary artery disease)    CABG surgery in 06/2005 for left main disease; normal EF; negative stress nuclear 08/2008  . Cerebrovascular disease    1993-CVA; 08/2008-mild atherosclerosis without focal stenosis  .  Chronic back pain greater than 3 months duration    "nerve damage down into left leg" (02/09/2013)  . Chronic bronchitis ([place])    "haven't had it in last 2-3 years; used to get it q yr" (02/09/2013)  . COPD (chronic obstructive pulmonary disease) ([place])   . Dementia    "more than mild; having more short term memory loss recently; on Aricept" (02/09/2013)  . Diverticulosis   . Dizziness    chronic  . Family history of anesthesia complication    "makes daughter sick" (02/09/2013)  . Frequent PVCs   . Frequent UTI   . GERD (gastroesophageal reflux disease)   . YWVPXTGG(269.4)    "sometimes weekly" (02/09/2013)  . High cholesterol   . Hypertension   . Mental disorder    mild dementia  . Orthostatic hypotension   . Osteopenia   . Pneumonia    "couple times; several years ago" (02/09/2013)  . Sinus bradycardia    s/p STJ Assurity dual chamber pacemaker by Dr Rayann Heman 02/2013  . Stroke ([place])    20-30 yrs ago  . Syncope     Negative even recorder dated 3/08  . Tobacco abuse    60 pack years; continuing    Past Surgical History:  Procedure Laterality Date  . ABDOMINAL HYSTERECTOMY  1990  . APPENDECTOMY    . CARDIAC CATHETERIZATION  2007  . CATARACT EXTRACTION, BILATERAL    . COLONOSCOPY  10/10/2011   Procedure: COLONOSCOPY;  Surgeon: Rogene Houston, MD;  [facility];  Service: Endoscopy;  Laterality: N/A;  1230  . COLONOSCOPY N/A 04/12/2015   Procedure: COLONOSCOPY;  Surgeon: Rogene Houston, MD;  [facility];  Service: Endoscopy;  Laterality: N/A;  . CORONARY ARTERY BYPASS GRAFT  2007   Left main disease  . ESOPHAGOGASTRODUODENOSCOPY  10/10/2011   Procedure: ESOPHAGOGASTRODUODENOSCOPY (EGD);  Surgeon: Rogene Houston, MD;  [facility];  Service: Endoscopy;  Laterality: N/A;  . ESOPHAGOGASTRODUODENOSCOPY N/A 04/08/2015   Procedure: ESOPHAGOGASTRODUODENOSCOPY (EGD);  Surgeon: Rogene Houston, MD;  [facility];  Service: Endoscopy;  Laterality: N/A;    . HERNIA REPAIR    . PACEMAKER INSERTION  02/09/2013   STJ Assurity pacemaker implanted by Dr Rayann Heman for symptomatic bradycardia  . PERMANENT PACEMAKER INSERTION N/A 02/09/2013   Procedure: PERMANENT PACEMAKER INSERTION;  Surgeon: Coralyn Mark, MD;  Location: [place] [facility];  Service: Cardiovascular;  Laterality: N/A;  . TONSILLECTOMY    . VENTRAL HERNIA REPAIR      Social History   Social History  . Marital status: Married    Spouse name: Percell Miller   . Number of children: N/A  . Years of education: 36   Occupational History  . retired    Social History Main Topics  . Smoking status: Former Smoker    Packs/day: 0.50  Years: 60.00    Types: Cigarettes    Start date: 12/12/1952    Quit date: 10/20/2006  . Smokeless tobacco: Never Used     Comment: 02/09/2013 "quit smoking in ~ 2009"  . Alcohol use No  . Drug use: No  . Sexual activity: No   Other Topics Concern  . Not on file   Social History Narrative   3 children    Right handed   HS    Drinks decaff     Vitals:   03/28/16 1114  Weight: 120 lb (54.4 kg)  Height: 5\' 4"  (1.626 m)    PHYSICAL EXAM General: NAD HEENT: Normal. Neck: No JVD, no thyromegaly. Lungs: Clear to auscultation bilaterally with normal respiratory effort. CV: Nondisplaced PMI.  Regular rate and mostly regular rhythm with premature contractions, normal S1/S2, no S3/S4, no murmur. No pretibial or periankle edema.   Abdomen: Soft, nontender, no distention.  Neurologic: Alert.  Psych: Normal affect. Skin: Normal. Musculoskeletal: No gross deformities.    ECG: Most recent ECG reviewed.      ASSESSMENT AND PLAN: 1. Sick sinus syndrome s/p pacemaker: Normal device function. Follows with Dr. Lovena Le.   2. CAD s/p CABG: Has chronic chest pain with normal stress test in 10/2015. Due to dizziness, I will try stopping Imdur to see if this helps.   3. Essential HTN: Controlled. No changes.  4. Hyperlipidemia: On Lipitor and fish oil.    5. PVC's: Unable to tolerate metoprolol.    Dispo: fu 6 months.   Kate Sable, M.D., F.A.C.C.

## 2016-03-28 NOTE — Patient Instructions (Signed)
Your physician wants you to follow-up in: 6 months Dr Virgina Jock will receive a reminder letter in the mail two months in advance. If you don't receive a letter, please call our office to schedule the follow-up appointment.     STOP Imdur     If you need a refill on your cardiac medications before your next appointment, please call your pharmacy.     Thank you for choosing [facility] !

## 2016-04-16 DIAGNOSIS — Z6820 Body mass index (BMI) 20.0-20.9, adult: Secondary | ICD-10-CM | POA: Diagnosis not present

## 2016-04-16 DIAGNOSIS — M169 Osteoarthritis of hip, unspecified: Secondary | ICD-10-CM | POA: Diagnosis not present

## 2016-04-16 DIAGNOSIS — I2510 Atherosclerotic heart disease of native coronary artery without angina pectoris: Secondary | ICD-10-CM | POA: Diagnosis not present

## 2016-04-23 DIAGNOSIS — H353222 Exudative age-related macular degeneration, left eye, with inactive choroidal neovascularization: Secondary | ICD-10-CM | POA: Diagnosis not present

## 2016-04-23 DIAGNOSIS — G453 Amaurosis fugax: Secondary | ICD-10-CM | POA: Diagnosis not present

## 2016-04-23 DIAGNOSIS — H35371 Puckering of macula, right eye: Secondary | ICD-10-CM | POA: Diagnosis not present

## 2016-04-23 DIAGNOSIS — H353132 Nonexudative age-related macular degeneration, bilateral, intermediate dry stage: Secondary | ICD-10-CM | POA: Diagnosis not present

## 2016-05-06 ENCOUNTER — Ambulatory Visit (INDEPENDENT_AMBULATORY_CARE_PROVIDER_SITE_OTHER): Payer: Medicare Other | Admitting: Internal Medicine

## 2016-05-06 ENCOUNTER — Encounter: Payer: Self-pay | Admitting: Internal Medicine

## 2016-05-06 DIAGNOSIS — I495 Sick sinus syndrome: Secondary | ICD-10-CM | POA: Diagnosis not present

## 2016-05-06 LAB — CUP PACEART INCLINIC DEVICE CHECK
Implantable Lead Location: 753860
Implantable Pulse Generator Implant Date: 20150203
Lead Channel Impedance Value: 487.5 Ohm
Lead Channel Pacing Threshold Amplitude: 0.75 V
Lead Channel Pacing Threshold Amplitude: 1 V
Lead Channel Pacing Threshold Amplitude: 1 V
Lead Channel Pacing Threshold Pulse Width: 0.4 ms
Lead Channel Setting Pacing Amplitude: 2.5 V
Lead Channel Setting Pacing Pulse Width: 0.4 ms
MDC IDC LEAD IMPLANT DT: 20150203
MDC IDC LEAD IMPLANT DT: 20150203
MDC IDC LEAD LOCATION: 753859
MDC IDC MSMT BATTERY VOLTAGE: 3.01 V
MDC IDC MSMT LEADCHNL RA PACING THRESHOLD AMPLITUDE: 0.75 V
MDC IDC MSMT LEADCHNL RA PACING THRESHOLD PULSEWIDTH: 0.4 ms
MDC IDC MSMT LEADCHNL RA PACING THRESHOLD PULSEWIDTH: 0.4 ms
MDC IDC MSMT LEADCHNL RA SENSING INTR AMPL: 2.4 mV
MDC IDC MSMT LEADCHNL RV IMPEDANCE VALUE: 562.5 Ohm
MDC IDC MSMT LEADCHNL RV PACING THRESHOLD PULSEWIDTH: 0.4 ms
MDC IDC MSMT LEADCHNL RV SENSING INTR AMPL: 12 mV
MDC IDC SESS DTM: 20180430112217
MDC IDC SET LEADCHNL RA PACING AMPLITUDE: 2 V
MDC IDC SET LEADCHNL RV SENSING SENSITIVITY: 2 mV
MDC IDC STAT BRADY RA PERCENT PACED: 45 %
MDC IDC STAT BRADY RV PERCENT PACED: 0.26 %
Pulse Gen Model: 2240
Pulse Gen Serial Number: 7586568

## 2016-05-06 NOTE — Progress Notes (Signed)
HPI [name] is returns today for ongoing evaluation of her PPM. She is a pleasant 83 yo woman with symptomatic sinus node dysfunction, s/p PPM insertion, along with CAD, s/p CABG. Her main complaint today is that she gets dizzy, especially when she stands up. Her daughter tells me that her memory is getting worse due to dementia.  The patient has tried multiple medications. She was on Imdur but stopping this did not improve her propensity to fall. No frank syncope.  She has musculoskeletal chest pain in her chest.        Past Medical History:  Diagnosis Date  . Anemia   . Arthritis    "fingers; back" (02/09/2013)  . Asthma   . CAD (coronary artery disease)    CABG surgery in 06/2005 for left main disease; normal EF; negative stress nuclear 08/2008  . Cerebrovascular disease    1993-CVA; 08/2008-mild atherosclerosis without focal stenosis  . Chronic back pain greater than 3 months duration    "nerve damage down into left leg" (02/09/2013)  . Chronic bronchitis ([place])    "haven't had it in last 2-3 years; used to get it q yr" (02/09/2013)  . COPD (chronic obstructive pulmonary disease) ([place])   . Dementia    "more than mild; having more short term memory loss recently; on Aricept" (02/09/2013)  . Diverticulosis   . Dizziness    chronic  . Family history of anesthesia complication    "makes daughter sick" (02/09/2013)  . Frequent PVCs   . Frequent UTI   . GERD (gastroesophageal reflux disease)   . ZOXWRUEA(540.9)    "sometimes weekly" (02/09/2013)  . High cholesterol   . Hypertension   . Mental disorder    mild dementia  . Orthostatic hypotension   . Osteopenia   . Pneumonia    "couple times; several years ago" (02/09/2013)  . Sinus bradycardia    s/p STJ Assurity dual chamber pacemaker by Dr Rayann Heman 02/2013  . Stroke ([place])    20-30 yrs ago  . Syncope     Negative even recorder dated 3/08  . Tobacco abuse    60 pack years; continuing    ROS:   All systems reviewed and  negative except as noted in the HPI.   Past Surgical History:  Procedure Laterality Date  . ABDOMINAL HYSTERECTOMY  1990  . APPENDECTOMY    . CARDIAC CATHETERIZATION  2007  . CATARACT EXTRACTION, BILATERAL    . COLONOSCOPY  10/10/2011   Procedure: COLONOSCOPY;  Surgeon: Rogene Houston, MD;  [facility];  Service: Endoscopy;  Laterality: N/A;  1230  . COLONOSCOPY N/A 04/12/2015   Procedure: COLONOSCOPY;  Surgeon: Rogene Houston, MD;  [facility];  Service: Endoscopy;  Laterality: N/A;  . CORONARY ARTERY BYPASS GRAFT  2007   Left main disease  . ESOPHAGOGASTRODUODENOSCOPY  10/10/2011   Procedure: ESOPHAGOGASTRODUODENOSCOPY (EGD);  Surgeon: Rogene Houston, MD;  [facility];  Service: Endoscopy;  Laterality: N/A;  . ESOPHAGOGASTRODUODENOSCOPY N/A 04/08/2015   Procedure: ESOPHAGOGASTRODUODENOSCOPY (EGD);  Surgeon: Rogene Houston, MD;  [facility];  Service: Endoscopy;  Laterality: N/A;  . HERNIA REPAIR    . PACEMAKER INSERTION  02/09/2013   STJ Assurity pacemaker implanted by Dr Rayann Heman for symptomatic bradycardia  . PERMANENT PACEMAKER INSERTION N/A 02/09/2013   Procedure: PERMANENT PACEMAKER INSERTION;  Surgeon: Coralyn Mark, MD;  Location: [facility] C[facility];  Service: Cardiovascular;  Laterality: N/A;  .  TONSILLECTOMY    . VENTRAL HERNIA REPAIR       Family History  Problem Relation Age of Onset  . Angina Mother   . Heart attack Mother   . Heart disease    . Arthritis    . Lung disease    . Asthma       Social History   Social History  . Marital status: Married    Spouse name: Percell Miller   . Number of children: N/A  . Years of education: 94   Occupational History  . retired    Social History Main Topics  . Smoking status: Former Smoker    Packs/day: 0.50    Years: 60.00    Types: Cigarettes    Start date: 12/12/1952    Quit date: 10/20/2006  . Smokeless tobacco: Never Used     Comment: 02/09/2013 "quit smoking in ~ 2009"  .  Alcohol use No  . Drug use: No  . Sexual activity: No   Other Topics Concern  . Not on file   Social History Narrative   3 children    Right handed   HS    Drinks decaff     BP (!) 160/80   Pulse 71   Ht 5\' 4"  (1.626 m)   Wt 122 lb (55.3 kg)   SpO2 98%   BMI 20.94 kg/m   Physical Exam:  Well appearing 83 yo woman, NAD HEENT: Unremarkable Neck:  6 cm JVD, no thyromegally Back:  No CVA tenderness Lungs:  Clear with no wheezes HEART:  Regular rate rhythm, no murmurs, no rubs, no clicks Abd:  soft, positive bowel sounds, no organomegally, no rebound, no guarding Ext:  2 plus pulses, no edema, no cyanosis, no clubbing Skin:  No rashes no nodules Neuro:  CN II through XII intact, motor grossly intact   DEVICE  Normal device function.  See PaceArt for details.   Assess/Plan: 1. Chest pain - her symptoms are not likely cardiac in nature. She will continue watchful waiting 2. PM - her [employer] DDD PM is working normally. She is pacing in the atrium about half the time. 3. HTN - her blood pressure is elevated today but is better at home. She has a propensity to orthostasis. Will follow.  Mikle Bosworth.D.

## 2016-05-06 NOTE — Patient Instructions (Signed)

## 2016-06-24 ENCOUNTER — Other Ambulatory Visit: Payer: Self-pay | Admitting: Cardiovascular Disease

## 2016-07-23 DIAGNOSIS — H43813 Vitreous degeneration, bilateral: Secondary | ICD-10-CM | POA: Diagnosis not present

## 2016-07-23 DIAGNOSIS — H353132 Nonexudative age-related macular degeneration, bilateral, intermediate dry stage: Secondary | ICD-10-CM | POA: Diagnosis not present

## 2016-07-23 DIAGNOSIS — H35371 Puckering of macula, right eye: Secondary | ICD-10-CM | POA: Diagnosis not present

## 2016-07-23 DIAGNOSIS — H353222 Exudative age-related macular degeneration, left eye, with inactive choroidal neovascularization: Secondary | ICD-10-CM | POA: Diagnosis not present

## 2016-08-05 ENCOUNTER — Ambulatory Visit (INDEPENDENT_AMBULATORY_CARE_PROVIDER_SITE_OTHER): Payer: Medicare Other | Admitting: *Deleted

## 2016-08-05 ENCOUNTER — Telehealth: Payer: Self-pay | Admitting: Cardiology

## 2016-08-05 DIAGNOSIS — I495 Sick sinus syndrome: Secondary | ICD-10-CM

## 2016-08-05 NOTE — Telephone Encounter (Signed)
Spoke w/ pt daughter and instructed her to call tech support. Pt daughter verbalized understanding.

## 2016-08-05 NOTE — Telephone Encounter (Signed)
Confirmed remote transmission w/ pt daughter.   

## 2016-08-05 NOTE — Telephone Encounter (Signed)
New Message:    Please call,problem transmitting.

## 2016-08-06 NOTE — Progress Notes (Signed)
Remote pacemaker transmission.   

## 2016-08-09 ENCOUNTER — Encounter: Payer: Self-pay | Admitting: Cardiology

## 2016-09-02 LAB — CUP PACEART REMOTE DEVICE CHECK
Brady Statistic AP VP Percent: 1 %
Brady Statistic AP VS Percent: 53 %
Brady Statistic AS VP Percent: 1 %
Brady Statistic RA Percent Paced: 41 %
Brady Statistic RV Percent Paced: 1 %
Date Time Interrogation Session: 20180730060014
Implantable Lead Implant Date: 20150203
Implantable Lead Location: 753859
Implantable Lead Location: 753860
Implantable Lead Model: 1948
Lead Channel Impedance Value: 590 Ohm
Lead Channel Pacing Threshold Amplitude: 1 V
Lead Channel Pacing Threshold Pulse Width: 0.4 ms
Lead Channel Sensing Intrinsic Amplitude: 11.5 mV
Lead Channel Setting Pacing Amplitude: 2 V
Lead Channel Setting Pacing Pulse Width: 0.4 ms
MDC IDC LEAD IMPLANT DT: 20150203
MDC IDC MSMT BATTERY REMAINING LONGEVITY: 123 mo
MDC IDC MSMT BATTERY REMAINING PERCENTAGE: 95.5 %
MDC IDC MSMT BATTERY VOLTAGE: 3.01 V
MDC IDC MSMT LEADCHNL RA IMPEDANCE VALUE: 490 Ohm
MDC IDC MSMT LEADCHNL RA PACING THRESHOLD AMPLITUDE: 0.75 V
MDC IDC MSMT LEADCHNL RA SENSING INTR AMPL: 2.8 mV
MDC IDC MSMT LEADCHNL RV PACING THRESHOLD PULSEWIDTH: 0.4 ms
MDC IDC PG IMPLANT DT: 20150203
MDC IDC SET LEADCHNL RV PACING AMPLITUDE: 2.5 V
MDC IDC SET LEADCHNL RV SENSING SENSITIVITY: 2 mV
MDC IDC STAT BRADY AS VS PERCENT: 39 %
Pulse Gen Model: 2240
Pulse Gen Serial Number: 7586568

## 2016-09-03 DIAGNOSIS — Z6820 Body mass index (BMI) 20.0-20.9, adult: Secondary | ICD-10-CM | POA: Diagnosis not present

## 2016-09-03 DIAGNOSIS — M79645 Pain in left finger(s): Secondary | ICD-10-CM | POA: Diagnosis not present

## 2016-09-03 DIAGNOSIS — N342 Other urethritis: Secondary | ICD-10-CM | POA: Diagnosis not present

## 2016-09-16 ENCOUNTER — Encounter (HOSPITAL_COMMUNITY): Payer: Self-pay | Admitting: Emergency Medicine

## 2016-09-16 ENCOUNTER — Emergency Department (HOSPITAL_COMMUNITY)
Admission: EM | Admit: 2016-09-16 | Discharge: 2016-09-16 | Disposition: A | Discharge status: Home / Self Care | Payer: Medicare Other | Attending: Emergency Medicine | Admitting: Emergency Medicine

## 2016-09-16 ENCOUNTER — Emergency Department (HOSPITAL_COMMUNITY): Payer: Medicare Other

## 2016-09-16 DIAGNOSIS — I2510 Atherosclerotic heart disease of native coronary artery without angina pectoris: Secondary | ICD-10-CM | POA: Insufficient documentation

## 2016-09-16 DIAGNOSIS — I10 Essential (primary) hypertension: Secondary | ICD-10-CM | POA: Insufficient documentation

## 2016-09-16 DIAGNOSIS — L03113 Cellulitis of right upper limb: Secondary | ICD-10-CM | POA: Diagnosis not present

## 2016-09-16 DIAGNOSIS — M25531 Pain in right wrist: Secondary | ICD-10-CM | POA: Diagnosis not present

## 2016-09-16 DIAGNOSIS — X58XXXA Exposure to other specified factors, initial encounter: Secondary | ICD-10-CM | POA: Diagnosis not present

## 2016-09-16 DIAGNOSIS — Z79899 Other long term (current) drug therapy: Secondary | ICD-10-CM | POA: Insufficient documentation

## 2016-09-16 DIAGNOSIS — Y999 Unspecified external cause status: Secondary | ICD-10-CM | POA: Diagnosis not present

## 2016-09-16 DIAGNOSIS — Y939 Activity, unspecified: Secondary | ICD-10-CM | POA: Insufficient documentation

## 2016-09-16 DIAGNOSIS — F0390 Unspecified dementia without behavioral disturbance: Secondary | ICD-10-CM | POA: Diagnosis not present

## 2016-09-16 DIAGNOSIS — S6991XA Unspecified injury of right wrist, hand and finger(s), initial encounter: Secondary | ICD-10-CM | POA: Diagnosis not present

## 2016-09-16 DIAGNOSIS — S60211A Contusion of right wrist, initial encounter: Secondary | ICD-10-CM

## 2016-09-16 DIAGNOSIS — L03119 Cellulitis of unspecified part of limb: Secondary | ICD-10-CM

## 2016-09-16 DIAGNOSIS — R296 Repeated falls: Secondary | ICD-10-CM

## 2016-09-16 DIAGNOSIS — Z9181 History of falling: Secondary | ICD-10-CM | POA: Diagnosis not present

## 2016-09-16 DIAGNOSIS — M7989 Other specified soft tissue disorders: Secondary | ICD-10-CM | POA: Diagnosis not present

## 2016-09-16 DIAGNOSIS — J45909 Unspecified asthma, uncomplicated: Secondary | ICD-10-CM | POA: Insufficient documentation

## 2016-09-16 DIAGNOSIS — J449 Chronic obstructive pulmonary disease, unspecified: Secondary | ICD-10-CM | POA: Diagnosis not present

## 2016-09-16 DIAGNOSIS — Z87891 Personal history of nicotine dependence: Secondary | ICD-10-CM | POA: Insufficient documentation

## 2016-09-16 DIAGNOSIS — W19XXXA Unspecified fall, initial encounter: Secondary | ICD-10-CM

## 2016-09-16 DIAGNOSIS — Y929 Unspecified place or not applicable: Secondary | ICD-10-CM | POA: Diagnosis not present

## 2016-09-16 DIAGNOSIS — Y92009 Unspecified place in unspecified non-institutional (private) residence as the place of occurrence of the external cause: Secondary | ICD-10-CM

## 2016-09-16 LAB — CBC WITH DIFFERENTIAL/PLATELET
BASOS ABS: 0 10*3/uL (ref 0.0–0.1)
Basophils Relative: 0 %
EOS ABS: 0 10*3/uL (ref 0.0–0.7)
Eosinophils Relative: 0 %
HCT: 44.9 % (ref 36.0–46.0)
HEMOGLOBIN: 14.8 g/dL (ref 12.0–15.0)
LYMPHS PCT: 7 %
Lymphs Abs: 1.3 10*3/uL (ref 0.7–4.0)
MCH: 31.1 pg (ref 26.0–34.0)
MCHC: 33 g/dL (ref 30.0–36.0)
MCV: 94.3 fL (ref 78.0–100.0)
Monocytes Absolute: 0.5 10*3/uL (ref 0.1–1.0)
Monocytes Relative: 3 %
NEUTROS ABS: 16.3 10*3/uL — AB (ref 1.7–7.7)
NEUTROS PCT: 90 %
Platelets: 182 10*3/uL (ref 150–400)
RBC: 4.76 MIL/uL (ref 3.87–5.11)
RDW: 12.8 % (ref 11.5–15.5)
WBC: 18.1 10*3/uL — ABNORMAL HIGH (ref 4.0–10.5)

## 2016-09-16 LAB — URINALYSIS, ROUTINE W REFLEX MICROSCOPIC
Bacteria, UA: NONE SEEN
Bilirubin Urine: NEGATIVE
GLUCOSE, UA: NEGATIVE mg/dL
Hgb urine dipstick: NEGATIVE
Ketones, ur: 5 mg/dL — AB
LEUKOCYTES UA: NEGATIVE
Nitrite: NEGATIVE
PH: 5 (ref 5.0–8.0)
Protein, ur: 100 mg/dL — AB
Specific Gravity, Urine: 1.023 (ref 1.005–1.030)

## 2016-09-16 LAB — HEPATIC FUNCTION PANEL
ALT: 15 U/L (ref 14–54)
AST: 22 U/L (ref 15–41)
Albumin: 3.7 g/dL (ref 3.5–5.0)
Alkaline Phosphatase: 62 U/L (ref 38–126)
BILIRUBIN DIRECT: 0.2 mg/dL (ref 0.1–0.5)
BILIRUBIN INDIRECT: 0.7 mg/dL (ref 0.3–0.9)
BILIRUBIN TOTAL: 0.9 mg/dL (ref 0.3–1.2)
Total Protein: 7.1 g/dL (ref 6.5–8.1)

## 2016-09-16 LAB — BASIC METABOLIC PANEL
Anion gap: 11 (ref 5–15)
BUN: 12 mg/dL (ref 6–20)
CALCIUM: 9 mg/dL (ref 8.9–10.3)
CHLORIDE: 98 mmol/L — AB (ref 101–111)
CO2: 27 mmol/L (ref 22–32)
Creatinine, Ser: 0.73 mg/dL (ref 0.44–1.00)
GFR calc non Af Amer: >60 mL/min (ref >60)
Glucose, Bld: 167 mg/dL — ABNORMAL HIGH (ref 65–99)
Potassium: 4.3 mmol/L (ref 3.5–5.1)
SODIUM: 136 mmol/L (ref 135–145)

## 2016-09-16 LAB — TROPONIN I

## 2016-09-16 LAB — LACTIC ACID, PLASMA
LACTIC ACID, VENOUS: 2.1 mmol/L — AB (ref 0.5–1.9)
Lactic Acid, Venous: 1.5 mmol/L (ref 0.5–1.9)

## 2016-09-16 MED ORDER — DOXYCYCLINE HYCLATE 100 MG PO CAPS: 100.0000 mg | ORAL_CAPSULE | Freq: Two times a day (BID) | ORAL | 0 refills | Status: DC

## 2016-09-16 NOTE — ED Provider Notes (Signed)
[name] Provider Note   CSN: [id number] Arrival date & time: 09/16/16  1130     History   Chief Complaint Chief Complaint  Patient presents with  . Fall    HPI [name] is a 83 y.o. female.  The history is provided by a caregiver and the patient. The history is limited by the condition of the patient (Hx dementia).  Fall   Pt was seen at 1220. Per pt and her family: Pt's daughter states she went to the house to give pt a bath when she noticed pt's right hand "bruised" and "swollen."  Pt's family states pt has hx of frequent falling, especially over the past 2 weeks, with last fall last night. Pt's daughter states pt was recently tx for UTI (has completed the course of abx).  Pt has hx of dementia and does not recall events. Pt denies any complaints.    Past Medical History:  Diagnosis Date  . Anemia   . Arthritis    "fingers; back" (02/09/2013)  . Asthma   . CAD (coronary artery disease)    CABG surgery in 06/2005 for left main disease; normal EF; negative stress nuclear 08/2008  . Cerebrovascular disease    1993-CVA; 08/2008-mild atherosclerosis without focal stenosis  . Chronic back pain greater than 3 months duration    "nerve damage down into left leg" (02/09/2013)  . Chronic bronchitis ([place])    "haven't had it in last 2-3 years; used to get it q yr" (02/09/2013)  . COPD (chronic obstructive pulmonary disease) ([place])   . Dementia    "more than mild; having more short term memory loss recently; on Aricept" (02/09/2013)  . Diverticulosis   . Dizziness    chronic  . Family history of anesthesia complication    "makes daughter sick" (02/09/2013)  . Frequent PVCs   . Frequent UTI   . GERD (gastroesophageal reflux disease)   . LGXQJJHE(174.0)    "sometimes weekly" (02/09/2013)  . High cholesterol   . Hypertension   . Mental disorder    mild dementia  . Orthostatic hypotension   . Osteopenia   . Pneumonia    "couple times; several years ago" (02/09/2013)  .  Sinus bradycardia    s/p STJ Assurity dual chamber pacemaker by Dr Rayann Heman 02/2013  . Stroke ([place])    20-30 yrs ago  . Syncope     Negative even recorder dated 3/08  . Tobacco abuse    60 pack years; continuing    Patient Active Problem List   Diagnosis Date Noted  . Acute blood loss anemia 04/11/2015  . Chronic chest pain 04/11/2015  . Dementia 04/11/2015  . Lower GI bleed 04/06/2015  . Pacemaker 05/27/2014  . Balance disorder 10/05/2013  . Dizziness of unknown cause 10/05/2013  . Difficulty in walking(719.7) 10/05/2013  . Muscle weakness (generalized) 10/05/2013  . Hypokalemia 09/21/2013  . Acute bronchitis 09/20/2013  . SIRS (systemic inflammatory response syndrome) ([place]) 09/20/2013  . Atypical chest pain 09/20/2013  . UTI (lower urinary tract infection) 09/20/2013  . Dyspnea 09/20/2013  . Anemia 02/09/2013  . Sick sinus syndrome ([place]) 01/27/2013  . Premature ventricular contraction 01/27/2013  . CAD (coronary artery disease) 01/27/2013  . Essential hypertension 01/27/2013  . Hx of falling, presenting hazards to health 12/21/2012  . Laboratory test 05/09/2011  . Arteriosclerotic cardiovascular disease (ASCVD)   . Cerebrovascular disease   . Syncope   . Hyperlipidemia 05/07/2009  . Tobacco abuse 05/07/2009  . Orthostatic hypotension  05/07/2009  . GERD (gastroesophageal reflux disease) 09/19/2008  . Osteopenia 09/19/2008    Past Surgical History:  Procedure Laterality Date  . ABDOMINAL HYSTERECTOMY  1990  . APPENDECTOMY    . CARDIAC CATHETERIZATION  2007  . CATARACT EXTRACTION, BILATERAL    . COLONOSCOPY  10/10/2011   Procedure: COLONOSCOPY;  Surgeon: Rogene Houston, MD;  [facility];  Service: Endoscopy;  Laterality: N/A;  1230  . COLONOSCOPY N/A 04/12/2015   Procedure: COLONOSCOPY;  Surgeon: Rogene Houston, MD;  [facility];  Service: Endoscopy;  Laterality: N/A;  . CORONARY ARTERY BYPASS GRAFT  2007   Left main disease  .  ESOPHAGOGASTRODUODENOSCOPY  10/10/2011   Procedure: ESOPHAGOGASTRODUODENOSCOPY (EGD);  Surgeon: Rogene Houston, MD;  [facility];  Service: Endoscopy;  Laterality: N/A;  . ESOPHAGOGASTRODUODENOSCOPY N/A 04/08/2015   Procedure: ESOPHAGOGASTRODUODENOSCOPY (EGD);  Surgeon: Rogene Houston, MD;  [facility];  Service: Endoscopy;  Laterality: N/A;  . HERNIA REPAIR    . PACEMAKER INSERTION  02/09/2013   STJ Assurity pacemaker implanted by Dr Rayann Heman for symptomatic bradycardia  . PERMANENT PACEMAKER INSERTION N/A 02/09/2013   Procedure: PERMANENT PACEMAKER INSERTION;  Surgeon: Coralyn Mark, MD;  Location: [facility];  Service: Cardiovascular;  Laterality: N/A;  . TONSILLECTOMY    . VENTRAL HERNIA REPAIR      OB History    Gravida Para Term Preterm AB Living   3 3 3          SAB TAB Ectopic Multiple Live Births                   Home Medications    Prior to Admission medications   Medication Sig Start Date End Date Taking? Authorizing Provider  acetaminophen (TYLENOL) 325 MG tablet Take 650 mg by mouth every 6 (six) hours as needed for mild pain. For pain   Yes [provider]  atorvastatin (LIPITOR) 10 MG tablet TAKE ONE (1) TABLET BY MOUTH EVERY DAY 06/24/16  Yes Evans Lance, MD  busPIRone (BUSPAR) 5 MG tablet  04/14/15  Yes [provider]  Calcium-Vitamin D (CALTRATE 600 PLUS-VIT D PO) Take 1 tablet by mouth at bedtime.    Yes [provider]  Cyanocobalamin (B-12 COMPLIANCE INJECTION IJ) Inject 1 Dose as directed every 30 (thirty) days.   Yes [provider]  Cyanocobalamin (VITAMIN B-12 CR PO) Take 1 tablet by mouth 2 (two) times daily.   Yes [provider]  donepezil (ARICEPT) 10 MG tablet Take 10 mg by mouth daily.    Yes [provider]  ferrous sulfate 325 (65 FE) MG tablet Take 1 tablet (325 mg total) by mouth 2 (two) times daily with a meal. 04/09/15  Yes Regalado, [name], MD  Multiple Vitamins-Minerals  (PRESERVISION AREDS PO) Take 1 capsule by mouth 2 (two) times daily.   Yes [provider]  Omega-3 Fatty Acids (FISH OIL) 1200 MG CAPS Take 1,200 mg by mouth at bedtime.    Yes [provider]  omeprazole (PRILOSEC) 20 MG capsule Take 2 capsules (40 mg total) by mouth daily. Patient taking differently: Take 20 mg by mouth 2 (two) times daily before a meal.  04/09/15  Yes Regalado, [name], MD  simethicone (MYLICON) 80 MG chewable tablet Chew 1 tablet (80 mg total) by mouth every 6 (six) hours as needed for flatulence. 04/09/15  Yes Regalado, [name], MD  zoledronic acid (RECLAST) 5 MG/100ML SOLN  injection Inject 5 mg into the vein once.    [provider]    Family History Family History  Problem Relation Age of Onset  . Angina Mother   . Heart attack Mother   . Heart disease Unknown   . Arthritis Unknown   . Lung disease Unknown   . Asthma Unknown     Social History Social History  Substance Use Topics  . Smoking status: Former Smoker    Packs/day: 0.50    Years: 60.00    Types: Cigarettes    Start date: 12/12/1952    Quit date: 10/20/2006  . Smokeless tobacco: Never Used     Comment: 02/09/2013 "quit smoking in ~ 2009"  . Alcohol use No     Allergies   Ativan [lorazepam]; Cephalexin; Contrast media [iodinated diagnostic agents]; Haldol [haloperidol lactate]; Iohexol; Penicillins; Shellfish allergy; and Sulfa antibiotics   Review of Systems Review of Systems  Unable to perform ROS: Dementia     Physical Exam Updated Vital Signs BP (!) 180/91   Pulse 61   Temp 98.5 F (36.9 C) (Oral)   Resp (!) 24   Ht 5\' 2"  (1.575 m)   Wt 56.2 kg (124 lb)   SpO2 95%   BMI 22.68 kg/m    BP (!) 162/83   Pulse 61   Temp 98.5 F (36.9 C) (Oral)   Resp (!) 23   Ht 5\' 2"  (1.575 m)   Wt 56.2 kg (124 lb)   SpO2 95%   BMI 22.68 kg/m    12:56:06 Orthostatic Vital Signs MW  Orthostatic Lying   BP- Lying: 158/79  Pulse- Lying: 79      Orthostatic  Sitting  BP- Sitting: 175/85  Pulse- Sitting: 81      Orthostatic Standing at 0 minutes  BP- Standing at 0 minutes: 155/87  Pulse- Standing at 0 minutes: 71     Physical Exam 1225: Physical examination:  Nursing notes reviewed; Vital signs and O2 SAT reviewed;  Constitutional: Well developed, Well nourished, Well hydrated, In no acute distress; Head:  Normocephalic, atraumatic; Eyes: EOMI, PERRL, No scleral icterus; ENMT: Mouth and pharynx normal, Mucous membranes moist; Neck: Supple, Full range of motion, No lymphadenopathy; Cardiovascular: Regular rate and rhythm, No gallop; Respiratory: Breath sounds clear & equal bilaterally, No wheezes.  Speaking full sentences with ease, Normal respiratory effort/excursion; Chest: Nontender, Movement normal; Abdomen: Soft, Nontender, Nondistended, Normal bowel sounds; Genitourinary: No CVA tenderness; Spine:  No midline CS, TS, LS tenderness.;; Extremities: Pulses normal, +TTP right dorsal hand and wrist with localized ecchymosis, edema and mild erythema without streaking, open wounds, palp fluctuance or deformity. No calf edema or asymmetry.; Neuro: Awake, alert, confused per hx dementia. Major CN grossly intact. No facial droop. Speech clear. Grips equal. Strength 5/5 equal bilat UE's and LE's. Moves all extremities spontaneously and to command without apparent gross focal motor deficits.; Skin: Color normal, Warm, Dry.   ED Treatments / Results  Labs (all labs ordered are listed, but only abnormal results are displayed)   EKG  EKG Interpretation  Date/Time:  Monday September 16 2016 12:06:33 EDT Ventricular Rate:  62 PR Interval:    QRS Duration: 100 QT Interval:  311 QTC Calculation: 316 R Axis:   90 Text Interpretation:  Atrial-paced complexes Ventricular trigeminy Borderline right axis deviation Probable anteroseptal infarct, old Repol abnrm, severe global ischemia (LM/MVD) Artifact When compared with ECG of 04/11/2015 Premature ventricular  complexes are now Present Nonspecific ST and T wave abnormality is  more pronounced Confirmed by Francine Graven [phone]) on 09/16/2016 12:28:46 PM       Radiology   Procedures Procedures (including critical care time)  Medications Ordered in ED Medications - No data to display   Initial Impression / Assessment and Plan / ED Course  I have reviewed the triage vital signs and the nursing notes.  Pertinent labs & imaging results that were available during my care of the patient were reviewed by me and considered in my medical decision making (see chart for details).  MDM Reviewed: previous chart, nursing note and vitals Reviewed previous: labs and ECG Interpretation: labs, ECG, x-ray and CT scan   Results for orders placed or performed during the hospital encounter of 09/16/16  Urinalysis, Routine w reflex microscopic  Result Value Ref Range   Color, Urine YELLOW YELLOW   APPearance CLEAR CLEAR   Specific Gravity, Urine 1.023 1.005 - 1.030   pH 5.0 5.0 - 8.0   Glucose, UA NEGATIVE NEGATIVE mg/dL   Hgb urine dipstick NEGATIVE NEGATIVE   Bilirubin Urine NEGATIVE NEGATIVE   Ketones, ur 5 (A) NEGATIVE mg/dL   Protein, ur 100 (A) NEGATIVE mg/dL   Nitrite NEGATIVE NEGATIVE   Leukocytes, UA NEGATIVE NEGATIVE   RBC / HPF 0-5 0 - 5 RBC/hpf   WBC, UA 0-5 0 - 5 WBC/hpf   Bacteria, UA NONE SEEN NONE SEEN   Squamous Epithelial / LPF 0-5 (A) NONE SEEN   Mucus PRESENT   CBC with Differential  Result Value Ref Range   WBC 18.1 (H) 4.0 - 10.5 K/uL   RBC 4.76 3.87 - 5.11 MIL/uL   Hemoglobin 14.8 12.0 - 15.0 g/dL   HCT 44.9 36.0 - 46.0 %   MCV 94.3 78.0 - 100.0 fL   MCH 31.1 26.0 - 34.0 pg   MCHC 33.0 30.0 - 36.0 g/dL   RDW 12.8 11.5 - 15.5 %   Platelets 182 150 - 400 K/uL   Neutrophils Relative % 90 %   Lymphocytes Relative 7 %   Monocytes Relative 3 %   Eosinophils Relative 0 %   Basophils Relative 0 %   Neutro Abs 16.3 (H) 1.7 - 7.7 K/uL   Lymphs Abs 1.3 0.7 - 4.0 K/uL    Monocytes Absolute 0.5 0.1 - 1.0 K/uL   Eosinophils Absolute 0.0 0.0 - 0.7 K/uL   Basophils Absolute 0.0 0.0 - 0.1 K/uL   Smear Review MORPHOLOGY UNREMARKABLE   Basic metabolic panel  Result Value Ref Range   Sodium 136 135 - 145 mmol/L   Potassium 4.3 3.5 - 5.1 mmol/L   Chloride 98 (L) 101 - 111 mmol/L   CO2 27 22 - 32 mmol/L   Glucose, Bld 167 (H) 65 - 99 mg/dL   BUN 12 6 - 20 mg/dL   Creatinine, Ser 0.73 0.44 - 1.00 mg/dL   Calcium 9.0 8.9 - 10.3 mg/dL   GFR calc non Af Amer >60 >60 mL/min   GFR calc Af Amer >60 >60 mL/min   Anion gap 11 5 - 15  Troponin I  Result Value Ref Range   Troponin I <0.03 <0.03 ng/mL  Lactic acid, plasma  Result Value Ref Range   Lactic Acid, Venous 2.1 (HH) 0.5 - 1.9 mmol/L  Hepatic function panel  Result Value Ref Range   Total Protein 7.1 6.5 - 8.1 g/dL   Albumin 3.7 3.5 - 5.0 g/dL   AST 22 15 - 41 U/L   ALT 15 14 - 54 U/L  Alkaline Phosphatase 62 38 - 126 U/L   Total Bilirubin 0.9 0.3 - 1.2 mg/dL   Bilirubin, Direct 0.2 0.1 - 0.5 mg/dL   Indirect Bilirubin 0.7 0.3 - 0.9 mg/dL   Dg Chest 2 View Result Date: 09/16/2016 CLINICAL DATA:  Altered mental status EXAM: CHEST  2 VIEW COMPARISON:  04/11/2015 FINDINGS: Chronic mild cardiomegaly. Status post CABG and dual-chamber pacer implant. Large lung volumes correlating with history of COPD. There is no edema, consolidation, effusion, or pneumothorax. IMPRESSION: 1. No acute finding when compared to prior. 2. Cardiomegaly and COPD. Electronically Signed   By: Monte Fantasia M.D.   On: 09/16/2016 13:38   Dg Wrist Complete Right Result Date: 09/16/2016 CLINICAL DATA:  Fall with pain and swelling. EXAM: RIGHT WRIST - COMPLETE 3+ VIEW COMPARISON:  None. FINDINGS: No fracture. No evidence for subluxation or dislocation. Degenerative changes are noted in the radial aspect of the carpus and in the first carpometacarpal joint. Bones are diffusely demineralized. IMPRESSION: Degenerative changes without acute  bony findings. Electronically Signed   By: Misty Stanley M.D.   On: 09/16/2016 13:38   Ct Head Wo Contrast Result Date: 09/16/2016 CLINICAL DATA:  Altered mental status with syncope EXAM: CT HEAD WITHOUT CONTRAST TECHNIQUE: Contiguous axial images were obtained from the base of the skull through the vertex without intravenous contrast. COMPARISON:  Brain MRI January 15, 2007 FINDINGS: Brain: There is moderate diffuse atrophy. There is no appreciable intracranial mass, hemorrhage, extra-axial fluid collection, or midline shift. There is small vessel disease throughout the centra semiovale bilaterally. There is evidence of a prior infarct at the right frontal- parietal junction, present previously. There is small vessel disease throughout the anterior limbs of the internal and external capsules bilaterally. There is small vessel disease in the right thalamus. There is a prior lacunar infarct in the lateral right midbrain in the inferior colliculus region. No acute infarct is demonstrable. Vascular: There is no appreciable hyperdense vessel. There are foci of vascular calcification in each carotid siphon region. Skull: The bony calvarium appears intact. Sinuses/Orbits: There is mucosal thickening in multiple ethmoid air cells. Visualized paranasal sinuses elsewhere clear. Orbits which are visualized appear symmetric bilaterally. Other: There is slight mastoid air cell disease bilaterally. Most mastoids are clear bilaterally. IMPRESSION: Moderate diffuse atrophy with extensive supratentorial small vessel disease. Prior infarct right frontal- parietal junction, stable. Foci of decreased attenuation in both basal ganglia and right thalamus regions are stable. Prior small infarct in the right inferior colliculus noted. No intracranial mass, hemorrhage, or extra-axial fluid collection. No evident acute infarct. Foci of arteriovascular calcification noted. Ethmoid sinus disease noted. Slight mastoid air cell disease noted  bilaterally. Electronically Signed   By: Lowella Grip III M.D.   On: 09/16/2016 13:50   Dg Hand Complete Right Result Date: 09/16/2016 CLINICAL DATA:  Wrist pain and swelling. EXAM: RIGHT HAND - COMPLETE 3+ VIEW COMPARISON:  02/13/2015 FINDINGS: No fracture. No subluxation or dislocation. Degenerative changes are seen in the radial aspect of the carpus and at the first carpometacarpal joint as well as scattered IP joints. Bones are diffusely demineralized. Soft tissue swelling is evident at the wrist. IMPRESSION: Soft tissue swelling without acute fracture evident. Electronically Signed   By: Misty Stanley M.D.   On: 09/16/2016 13:38    1600:  Right dorsal wrist with ecchymosis and mild erythema. +known fall last night, also known hx of cellulitis right wrist. No effusion to suggest septic joint at this time. Pt afebrile. Pt has tol  PO well while in the ED without N/V. Pt has ambulated with her baseline gait in her exam room (per family at bedside). Pt will need antibiotic to tx cellulitis, admit vs discharge based on 2nd lactic acid trend. Family is agreeable with this plan. Sign out to Dr. Roderic Palau.       Final Clinical Impressions(s) / ED Diagnoses   Final diagnoses:  Fall    New Prescriptions New Prescriptions   No medications on file     Francine Graven, DO 09/16/16 1610

## 2016-09-16 NOTE — Discharge Instructions (Signed)
Follow up with your md as planned tomorrow °

## 2016-09-16 NOTE — ED Notes (Signed)
CRITICAL VALUE ALERT  Critical Value:  Lactic Acid 2.1  Date & Time Notified:  09/16/16 1316  Provider Notified: Dr. Thurnell Garbe  Orders Received/Actions taken: EDP made aware, no further orders given

## 2016-09-16 NOTE — ED Triage Notes (Addendum)
Pt lives with elderly husband and son. Daughter came over to give her a bath today and noted right hand swollen and thinks she may have fallen today. Daughter states was not like that yesterday. N/v x 1 pta. Pt pale. Hx of alzheimers. Daughter states pt has went from walking and understanding commands to falling multiple times and not understanding commands within last 2 weeks. Right wrist swelling noted, radial pulse present. Cap refill immediate. Warmer to touch than left wrist.

## 2016-09-16 NOTE — ED Notes (Signed)
Pt ambulated in room with 2 person assist. Pt had to be encouraged at times to move right leg, other times pt moved right leg normally. EDP at bedside and made aware.

## 2016-09-16 NOTE — ED Notes (Signed)
Pt tolerated water with no difficulty for fluid challenge.

## 2016-09-17 DIAGNOSIS — R296 Repeated falls: Secondary | ICD-10-CM | POA: Diagnosis not present

## 2016-09-17 DIAGNOSIS — Z1389 Encounter for screening for other disorder: Secondary | ICD-10-CM | POA: Diagnosis not present

## 2016-09-17 DIAGNOSIS — M169 Osteoarthritis of hip, unspecified: Secondary | ICD-10-CM | POA: Diagnosis not present

## 2016-09-17 DIAGNOSIS — I2510 Atherosclerotic heart disease of native coronary artery without angina pectoris: Secondary | ICD-10-CM | POA: Diagnosis not present

## 2016-09-17 DIAGNOSIS — I209 Angina pectoris, unspecified: Secondary | ICD-10-CM | POA: Diagnosis not present

## 2016-09-17 DIAGNOSIS — Z6820 Body mass index (BMI) 20.0-20.9, adult: Secondary | ICD-10-CM | POA: Diagnosis not present

## 2016-09-17 LAB — URINE CULTURE: CULTURE: NO GROWTH

## 2016-10-03 ENCOUNTER — Encounter: Payer: Self-pay | Admitting: Cardiovascular Disease

## 2016-10-03 ENCOUNTER — Ambulatory Visit (INDEPENDENT_AMBULATORY_CARE_PROVIDER_SITE_OTHER): Payer: Medicare Other | Admitting: Cardiovascular Disease

## 2016-10-03 VITALS — BP 122/72 | HR 74 | Ht 67.0 in | Wt 124.0 lb

## 2016-10-03 DIAGNOSIS — Z8673 Personal history of transient ischemic attack (TIA), and cerebral infarction without residual deficits: Secondary | ICD-10-CM

## 2016-10-03 DIAGNOSIS — R296 Repeated falls: Secondary | ICD-10-CM

## 2016-10-03 DIAGNOSIS — M169 Osteoarthritis of hip, unspecified: Secondary | ICD-10-CM | POA: Diagnosis not present

## 2016-10-03 DIAGNOSIS — I10 Essential (primary) hypertension: Secondary | ICD-10-CM

## 2016-10-03 DIAGNOSIS — I25708 Atherosclerosis of coronary artery bypass graft(s), unspecified, with other forms of angina pectoris: Secondary | ICD-10-CM | POA: Diagnosis not present

## 2016-10-03 DIAGNOSIS — Z950 Presence of cardiac pacemaker: Secondary | ICD-10-CM

## 2016-10-03 DIAGNOSIS — Z9289 Personal history of other medical treatment: Secondary | ICD-10-CM

## 2016-10-03 DIAGNOSIS — Z23 Encounter for immunization: Secondary | ICD-10-CM

## 2016-10-03 DIAGNOSIS — E7800 Pure hypercholesterolemia, unspecified: Secondary | ICD-10-CM

## 2016-10-03 DIAGNOSIS — Z8719 Personal history of other diseases of the digestive system: Secondary | ICD-10-CM

## 2016-10-03 NOTE — Patient Instructions (Signed)
Medication Instructions:No change  Labwork: None  Procedures/Testing: None  Follow-Up: 6 months with Dr Bronson Ing       If you need a refill on your cardiac medications before your next appointment, please call your pharmacy.       Thank you for choosing [facility] !

## 2016-10-03 NOTE — Progress Notes (Signed)
SUBJECTIVE: The patient presents for routine follow up. She has a history of CAD and CABG as well as CVA. She has a history of chronic dizziness and chest pain. Her most recent nuclear stress test was on 10/11/15 which showed no evidence of ischemia.  She had a pacemaker placed for tachycardia-bradycardia syndrome by Dr. Rayann Heman. She also has outflow tract PVCs for which she was started on metoprolol, but this was discontinued by her PCP as it didn't make her feel well.   She has a history of frequent falls and was recently evaluated in the ED for this.  Echo in 01/2013 demonstrated the following:  Mild LVH with LVEF 23-53%, grade 2 diastolic dysfunction. Moderate left atrial enlargement. MIldly thickened mtiral leaflets with mild, posteriorly directed mtiral regurgitation. Mildly sclerotic aortic valve with trivial aortic regurgitation. Mild RV dilatation. Mild to moderate tricuspid regurgitation with moderate pulmonary hypertension, PASP 52 mmHg.  She is accompanied by her husband and daughter today. Her daughter says she has been weak and falling frequently. She feels it is due to right wrist issues and a lack of being able to steady herself when she gets dizzy. The patient denies chest pain. She has some shortness of breath and her daughter says this occurs when she is anxious when she is not able to see her husband.  Her daughter stopped Lipitor due to weakness and balance issues.  On the day she was seen in the ED, her daughter noticed that the patient's speech was slow.  She has a history of GI bleeding. She is not on aspirin for this reason. Colonoscopy on 04/12/15 demonstrated 2 colonic angiodysplastic lesions. She was found to have sigmoid diverticulosis which was felt to be the source of GI bleeding.  I reviewed labs performed on the day she was evaluated in the ED, 09/16/16: Elevated white blood cells 18.1, hemoglobin 14.8, platelets 182, sodium 136, potassium 4.3, BUN 12,  creatinine 0.73, normal troponin, normal LFTs, no growth with urine culture, and mildly elevated lactate of 2.1.  Her daughter requests that a letter be written so that the patient can be approved for home health which she feels is necessary.   Review of Systems: As per "subjective", otherwise negative.  Allergies  Allergen Reactions  . Ativan [Lorazepam]     Aggressive, hallucination  . Cephalexin Hives  . Contrast Media [Iodinated Diagnostic Agents]     Unknown  . Haldol [Haloperidol Lactate]     Aggressive, hallucination  . Iohexol     Urticaria, emesis, numbness during IVP performed at [facility] years ago  . Penicillins Hives    Has patient had a PCN reaction causing immediate rash, facial/tongue/throat swelling, SOB or lightheadedness with hypotension: Yes Has patient had a PCN reaction causing severe rash involving mucus membranes or skin necrosis: No Has patient had a PCN reaction that required hospitalization NoNo Has patient had a PCN reaction occurring within the last 10 years: No If all of the above answers are "NO", then may proceed with Cephalosporin use.   . Shellfish Allergy Other (See Comments)    Unknown  . Sulfa Antibiotics Hives    Current Outpatient Prescriptions  Medication Sig Dispense Refill  . acetaminophen (TYLENOL) 325 MG tablet Take 650 mg by mouth every 6 (six) hours as needed for mild pain. For pain    . busPIRone (BUSPAR) 5 MG tablet     . Calcium-Vitamin D (CALTRATE 600 PLUS-VIT D PO) Take 1 tablet by mouth  at bedtime.     . Cyanocobalamin (B-12 COMPLIANCE INJECTION IJ) Inject 1 Dose as directed every 30 (thirty) days.    . Cyanocobalamin (VITAMIN B-12 CR PO) Take 1 tablet by mouth 2 (two) times daily.    Marland Kitchen donepezil (ARICEPT) 10 MG tablet Take 10 mg by mouth daily.     . ferrous sulfate 325 (65 FE) MG tablet Take 1 tablet (325 mg total) by mouth 2 (two) times daily with a meal. 60 tablet 3  . Multiple Vitamins-Minerals (PRESERVISION  AREDS PO) Take 1 capsule by mouth 2 (two) times daily.    . Omega-3 Fatty Acids (FISH OIL) 1200 MG CAPS Take 1,200 mg by mouth at bedtime.     Marland Kitchen omeprazole (PRILOSEC) 20 MG capsule Take 2 capsules (40 mg total) by mouth daily. (Patient taking differently: Take 20 mg by mouth 2 (two) times daily before a meal. ) 90 capsule 3  . simethicone (MYLICON) 80 MG chewable tablet Chew 1 tablet (80 mg total) by mouth every 6 (six) hours as needed for flatulence. 30 tablet 0  . zoledronic acid (RECLAST) 5 MG/100ML SOLN injection Inject 5 mg into the vein once.     No current facility-administered medications for this visit.     Past Medical History:  Diagnosis Date  . Anemia   . Arthritis    "fingers; back" (02/09/2013)  . Asthma   . CAD (coronary artery disease)    CABG surgery in 06/2005 for left main disease; normal EF; negative stress nuclear 08/2008  . Cerebrovascular disease    1993-CVA; 08/2008-mild atherosclerosis without focal stenosis  . Chronic back pain greater than 3 months duration    "nerve damage down into left leg" (02/09/2013)  . Chronic bronchitis ([place])    "haven't had it in last 2-3 years; used to get it q yr" (02/09/2013)  . COPD (chronic obstructive pulmonary disease) ([place])   . Dementia    "more than mild; having more short term memory loss recently; on Aricept" (02/09/2013)  . Diverticulosis   . Dizziness    chronic  . Family history of anesthesia complication    "makes daughter sick" (02/09/2013)  . Frequent PVCs   . Frequent UTI   . GERD (gastroesophageal reflux disease)   . SAYTKZSW(109.3)    "sometimes weekly" (02/09/2013)  . High cholesterol   . Hypertension   . Mental disorder    mild dementia  . Orthostatic hypotension   . Osteopenia   . Pneumonia    "couple times; several years ago" (02/09/2013)  . Sinus bradycardia    s/p STJ Assurity dual chamber pacemaker by Dr Rayann Heman 02/2013  . Stroke ([place])    20-30 yrs ago  . Syncope     Negative even recorder dated 3/08  .  Tobacco abuse    60 pack years; continuing    Past Surgical History:  Procedure Laterality Date  . ABDOMINAL HYSTERECTOMY  1990  . APPENDECTOMY    . CARDIAC CATHETERIZATION  2007  . CATARACT EXTRACTION, BILATERAL    . COLONOSCOPY  10/10/2011   Procedure: COLONOSCOPY;  Surgeon: Rogene Houston, MD;  Location: [facility];  Service: Endoscopy;  Laterality: N/A;  1230  . COLONOSCOPY N/A 04/12/2015   Procedure: COLONOSCOPY;  Surgeon: Rogene Houston, MD;  Location: [facility];  Service: Endoscopy;  Laterality: N/A;  . CORONARY ARTERY BYPASS GRAFT  2007   Left main disease  . ESOPHAGOGASTRODUODENOSCOPY  10/10/2011   Procedure: ESOPHAGOGASTRODUODENOSCOPY (EGD);  Surgeon: Bernadene Person  Gloriann Loan, MD;  Location: [facility];  Service: Endoscopy;  Laterality: N/A;  . ESOPHAGOGASTRODUODENOSCOPY N/A 04/08/2015   Procedure: ESOPHAGOGASTRODUODENOSCOPY (EGD);  Surgeon: Rogene Houston, MD;  Location: [facility];  Service: Endoscopy;  Laterality: N/A;  . HERNIA REPAIR    . PACEMAKER INSERTION  02/09/2013   STJ Assurity pacemaker implanted by Dr Rayann Heman for symptomatic bradycardia  . PERMANENT PACEMAKER INSERTION N/A 02/09/2013   Procedure: PERMANENT PACEMAKER INSERTION;  Surgeon: Coralyn Mark, MD;  Location: [facility];  Service: Cardiovascular;  Laterality: N/A;  . TONSILLECTOMY    . VENTRAL HERNIA REPAIR      Social History   Social History  . Marital status: Married    Spouse name: Percell Miller   . Number of children: N/A  . Years of education: 53   Occupational History  . retired    Social History Main Topics  . Smoking status: Former Smoker    Packs/day: 0.50    Years: 60.00    Types: Cigarettes    Start date: 12/12/1952    Quit date: 10/20/2006  . Smokeless tobacco: Never Used     Comment: 02/09/2013 "quit smoking in ~ 2009"  . Alcohol use No  . Drug use: No  . Sexual activity: No   Other Topics Concern  . Not on file   Social History Narrative   3 children    Right handed   HS     Drinks decaff     Vitals:   10/03/16 1051  BP: 122/72  Pulse: 74  SpO2: 97%  Weight: 124 lb (56.2 kg)  Height: 5\' 7"  (1.702 m)    Wt Readings from Last 3 Encounters:  10/03/16 124 lb (56.2 kg)  09/16/16 124 lb (56.2 kg)  05/06/16 122 lb (55.3 kg)     PHYSICAL EXAM General: NAD, elderly, frail. HEENT: Normal. Neck: No JVD, no thyromegaly. Lungs: Clear to auscultation bilaterally with normal respiratory effort. CV: Nondisplaced PMI.  Regular rate and rhythm, normal S1/S2, no S3/S4, no murmur. No pretibial or periankle edema.   Abdomen: Soft, nontender, no distention.  Neurologic: Alert.  Psych: Normal affect.     ECG: Most recent ECG reviewed.   Labs: Lab Results  Component Value Date/Time   K 4.3 09/16/2016 11:56 AM   BUN 12 09/16/2016 11:56 AM   CREATININE 0.73 09/16/2016 11:56 AM   CREATININE 0.71 01/18/2013 03:42 PM   ALT 15 09/16/2016 12:15 PM   TSH 3.297 04/11/2015 04:16 PM   TSH 2.680 09/20/2013 10:42 AM   HGB 14.8 09/16/2016 11:56 AM     Lipids: Lab Results  Component Value Date/Time   LDLCALC 50 09/20/2013 10:42 AM   CHOL 144 09/20/2013 10:42 AM   TRIG 50 09/20/2013 10:42 AM   HDL 84 09/20/2013 10:42 AM       ASSESSMENT AND PLAN:  1. Sick sinus syndrome s/p pacemaker: Normal device function. Follows with Dr. Lovena Le.   2. CAD s/p CABG: Symptomatically stable. Normal stress test in 10/2015. Not on ASA due to h/o GI bleeding. No longer on statin due to weakness and falls (stopped by daughter). I am not inclined to restart statin therapy.  3. Essential HTN: Controlled. No changes.  4. Hyperlipidemia: On fish oil.No longer on statin due to weakness and falls (stopped by daughter). I am not inclined to restart statin therapy.  5. PVC's: Unable to tolerate metoprolol.   6. Influenza vaccination will be provided today.  7. H/o CVA with frequent falls  and generalized weakness: She is not on aspirin due to a history of GI bleeding. She is no  longer on statin therapy. I will write a letter in support of the necessity of home health. Her most recent episode may have been due to transient cerebral ischemia. I recommended the patient be provided aspirin when there appear to be signs of stroke.      Disposition: Follow up 6 months.   Kate Sable, M.D., F.A.C.C.

## 2016-11-02 DIAGNOSIS — M169 Osteoarthritis of hip, unspecified: Secondary | ICD-10-CM | POA: Diagnosis not present

## 2016-11-04 ENCOUNTER — Ambulatory Visit (INDEPENDENT_AMBULATORY_CARE_PROVIDER_SITE_OTHER): Payer: Medicare Other | Admitting: *Deleted

## 2016-11-04 DIAGNOSIS — I495 Sick sinus syndrome: Secondary | ICD-10-CM | POA: Diagnosis not present

## 2016-11-04 NOTE — Progress Notes (Signed)
Remote pacemaker transmission.   

## 2016-11-08 LAB — CUP PACEART REMOTE DEVICE CHECK
Battery Remaining Longevity: 124 mo
Battery Remaining Percentage: 95.5 %
Brady Statistic AP VS Percent: 50 %
Brady Statistic AS VS Percent: 43 %
Brady Statistic RA Percent Paced: 39 %
Date Time Interrogation Session: 20181029060015
Implantable Lead Location: 753859
Lead Channel Impedance Value: 510 Ohm
Lead Channel Pacing Threshold Pulse Width: 0.4 ms
Lead Channel Sensing Intrinsic Amplitude: 12 mV
Lead Channel Sensing Intrinsic Amplitude: 4.5 mV
Lead Channel Setting Pacing Amplitude: 2 V
Lead Channel Setting Sensing Sensitivity: 2 mV
MDC IDC LEAD IMPLANT DT: 20150203
MDC IDC LEAD IMPLANT DT: 20150203
MDC IDC LEAD LOCATION: 753860
MDC IDC MSMT BATTERY VOLTAGE: 3.01 V
MDC IDC MSMT LEADCHNL RA PACING THRESHOLD AMPLITUDE: 0.75 V
MDC IDC MSMT LEADCHNL RV IMPEDANCE VALUE: 600 Ohm
MDC IDC MSMT LEADCHNL RV PACING THRESHOLD AMPLITUDE: 1 V
MDC IDC MSMT LEADCHNL RV PACING THRESHOLD PULSEWIDTH: 0.4 ms
MDC IDC PG IMPLANT DT: 20150203
MDC IDC SET LEADCHNL RV PACING AMPLITUDE: 2.5 V
MDC IDC SET LEADCHNL RV PACING PULSEWIDTH: 0.4 ms
MDC IDC STAT BRADY AP VP PERCENT: 1 %
MDC IDC STAT BRADY AS VP PERCENT: 1 %
MDC IDC STAT BRADY RV PERCENT PACED: 1 %
Pulse Gen Model: 2240
Pulse Gen Serial Number: 7586568

## 2016-11-12 ENCOUNTER — Encounter: Payer: Self-pay | Admitting: Cardiology

## 2016-11-12 NOTE — Progress Notes (Signed)
Letter  

## 2016-12-03 DIAGNOSIS — M169 Osteoarthritis of hip, unspecified: Secondary | ICD-10-CM | POA: Diagnosis not present

## 2017-01-02 DIAGNOSIS — M169 Osteoarthritis of hip, unspecified: Secondary | ICD-10-CM | POA: Diagnosis not present

## 2017-01-16 ENCOUNTER — Encounter (INDEPENDENT_AMBULATORY_CARE_PROVIDER_SITE_OTHER): Payer: Self-pay | Admitting: Internal Medicine

## 2017-01-30 ENCOUNTER — Emergency Department (HOSPITAL_COMMUNITY): Payer: Medicare Other

## 2017-01-30 ENCOUNTER — Encounter (HOSPITAL_COMMUNITY): Payer: Self-pay

## 2017-01-30 ENCOUNTER — Other Ambulatory Visit: Payer: Self-pay

## 2017-01-30 ENCOUNTER — Emergency Department (HOSPITAL_COMMUNITY)
Admission: EM | Admit: 2017-01-30 | Discharge: 2017-01-30 | Disposition: A | Discharge status: Home / Self Care | Payer: Medicare Other | Attending: Emergency Medicine | Admitting: Emergency Medicine

## 2017-01-30 DIAGNOSIS — J449 Chronic obstructive pulmonary disease, unspecified: Secondary | ICD-10-CM | POA: Diagnosis not present

## 2017-01-30 DIAGNOSIS — Z950 Presence of cardiac pacemaker: Secondary | ICD-10-CM | POA: Diagnosis not present

## 2017-01-30 DIAGNOSIS — S299XXA Unspecified injury of thorax, initial encounter: Secondary | ICD-10-CM | POA: Diagnosis not present

## 2017-01-30 DIAGNOSIS — S79911A Unspecified injury of right hip, initial encounter: Secondary | ICD-10-CM | POA: Diagnosis not present

## 2017-01-30 DIAGNOSIS — F0390 Unspecified dementia without behavioral disturbance: Secondary | ICD-10-CM | POA: Diagnosis not present

## 2017-01-30 DIAGNOSIS — J45909 Unspecified asthma, uncomplicated: Secondary | ICD-10-CM | POA: Diagnosis not present

## 2017-01-30 DIAGNOSIS — Z79899 Other long term (current) drug therapy: Secondary | ICD-10-CM | POA: Diagnosis not present

## 2017-01-30 DIAGNOSIS — Z87891 Personal history of nicotine dependence: Secondary | ICD-10-CM | POA: Insufficient documentation

## 2017-01-30 DIAGNOSIS — I2510 Atherosclerotic heart disease of native coronary artery without angina pectoris: Secondary | ICD-10-CM | POA: Insufficient documentation

## 2017-01-30 DIAGNOSIS — Z951 Presence of aortocoronary bypass graft: Secondary | ICD-10-CM | POA: Insufficient documentation

## 2017-01-30 DIAGNOSIS — R531 Weakness: Secondary | ICD-10-CM

## 2017-01-30 DIAGNOSIS — R296 Repeated falls: Secondary | ICD-10-CM | POA: Insufficient documentation

## 2017-01-30 DIAGNOSIS — I10 Essential (primary) hypertension: Secondary | ICD-10-CM | POA: Insufficient documentation

## 2017-01-30 DIAGNOSIS — S79912A Unspecified injury of left hip, initial encounter: Secondary | ICD-10-CM | POA: Diagnosis not present

## 2017-01-30 DIAGNOSIS — M542 Cervicalgia: Secondary | ICD-10-CM | POA: Diagnosis not present

## 2017-01-30 DIAGNOSIS — S0003XA Contusion of scalp, initial encounter: Secondary | ICD-10-CM | POA: Diagnosis not present

## 2017-01-30 LAB — URINALYSIS, ROUTINE W REFLEX MICROSCOPIC
Bilirubin Urine: NEGATIVE
GLUCOSE, UA: NEGATIVE mg/dL
HGB URINE DIPSTICK: NEGATIVE
KETONES UR: 20 mg/dL — AB
Nitrite: POSITIVE — AB
PROTEIN: 100 mg/dL — AB
Specific Gravity, Urine: 1.029 (ref 1.005–1.030)
pH: 5 (ref 5.0–8.0)

## 2017-01-30 LAB — COMPREHENSIVE METABOLIC PANEL
ALT: 15 U/L (ref 14–54)
ANION GAP: 11 (ref 5–15)
AST: 22 U/L (ref 15–41)
Albumin: 3.9 g/dL (ref 3.5–5.0)
Alkaline Phosphatase: 55 U/L (ref 38–126)
BILIRUBIN TOTAL: 0.7 mg/dL (ref 0.3–1.2)
BUN: 16 mg/dL (ref 6–20)
CALCIUM: 9.1 mg/dL (ref 8.9–10.3)
CO2: 25 mmol/L (ref 22–32)
Chloride: 105 mmol/L (ref 101–111)
Creatinine, Ser: 0.8 mg/dL (ref 0.44–1.00)
GFR calc Af Amer: >60 mL/min (ref >60)
Glucose, Bld: 108 mg/dL — ABNORMAL HIGH (ref 65–99)
POTASSIUM: 3.8 mmol/L (ref 3.5–5.1)
Sodium: 141 mmol/L (ref 135–145)
Total Protein: 7.1 g/dL (ref 6.5–8.1)

## 2017-01-30 LAB — CBC WITH DIFFERENTIAL/PLATELET
BASOS PCT: 0 %
Basophils Absolute: 0 10*3/uL (ref 0.0–0.1)
Eosinophils Absolute: 0 10*3/uL (ref 0.0–0.7)
Eosinophils Relative: 0 %
HEMATOCRIT: 49.3 % — AB (ref 36.0–46.0)
HEMOGLOBIN: 16 g/dL — AB (ref 12.0–15.0)
LYMPHS ABS: 0.9 10*3/uL (ref 0.7–4.0)
LYMPHS PCT: 5 %
MCH: 30.8 pg (ref 26.0–34.0)
MCHC: 32.5 g/dL (ref 30.0–36.0)
MCV: 95 fL (ref 78.0–100.0)
MONO ABS: 1.5 10*3/uL — AB (ref 0.1–1.0)
MONOS PCT: 8 %
NEUTROS ABS: 16.2 10*3/uL — AB (ref 1.7–7.7)
NEUTROS PCT: 87 %
Platelets: 176 10*3/uL (ref 150–400)
RBC: 5.19 MIL/uL — ABNORMAL HIGH (ref 3.87–5.11)
RDW: 12.6 % (ref 11.5–15.5)
WBC: 18.6 10*3/uL — ABNORMAL HIGH (ref 4.0–10.5)

## 2017-01-30 LAB — LIPASE, BLOOD: Lipase: 21 U/L (ref 11–51)

## 2017-01-30 MED ORDER — NITROFURANTOIN MONOHYD MACRO 100 MG PO CAPS: 100.0000 mg | ORAL_CAPSULE | Freq: Two times a day (BID) | ORAL | 0 refills | Status: AC

## 2017-01-30 MED ORDER — NITROFURANTOIN MONOHYD MACRO 100 MG PO CAPS
100.0000 mg | ORAL_CAPSULE | Freq: Two times a day (BID) | ORAL | Status: DC
  Administered 2017-01-30: 100 mg via ORAL
  Filled 2017-01-30: qty 1

## 2017-01-30 MED ORDER — SODIUM CHLORIDE 0.9 % IV SOLN
INTRAVENOUS | Status: DC
  Administered 2017-01-30: 20:00:00 via INTRAVENOUS

## 2017-01-30 MED ORDER — SODIUM CHLORIDE 0.9 % IV BOLUS (SEPSIS)
500.0000 mL | Freq: Once | INTRAVENOUS | Status: AC
  Administered 2017-01-30: 500 mL via INTRAVENOUS

## 2017-01-30 NOTE — ED Triage Notes (Signed)
EMS was originally called out for a stroke. Per EMS, no stroke symptoms noted. Pt has been having more trouble ambulating. Is hurting on her right side.   VSS Negative stroke scale Is having PVC's on the monitor 20G Left AC

## 2017-01-30 NOTE — Discharge Instructions (Signed)
Follow-up with her doctor for additional assistance in home.  Patient is becoming more bedridden.  Patient also may require placement in a nursing facility. patient's primary care doctor will be able to help with this.

## 2017-01-30 NOTE — ED Provider Notes (Addendum)
[facility] EMERGENCY DEPARTMENT Provider Note   CSN: [id number] Arrival date & time: 01/30/17  [place]     History   Chief Complaint Chief Complaint  Patient presents with  . Weakness    HPI [name] is a 84 y.o. female.  Patient brought in by EMS at the request of family.  Call was originally for possible stroke.  EMS noted that no stroke symptoms were present.  Patient has fallen frequently in the past week.  Had a hard fall on Sunday when she fell backwards and hit the back of her head.  Patient's been having more trouble ambulating.  Patient seemed to be hurting on her left side.  Using her right side more.  Patient has a history of dementia.  According to patient's husband patient's not able really to walk at all for the past few days.  They are struggling to take care of her at home.      Past Medical History:  Diagnosis Date  . Anemia   . Arthritis    "fingers; back" (02/09/2013)  . Asthma   . CAD (coronary artery disease)    CABG surgery in 06/2005 for left main disease; normal EF; negative stress nuclear 08/2008  . Cerebrovascular disease    1993-CVA; 08/2008-mild atherosclerosis without focal stenosis  . Chronic back pain greater than 3 months duration    "nerve damage down into left leg" (02/09/2013)  . Chronic bronchitis ([place])    "haven't had it in last 2-3 years; used to get it q yr" (02/09/2013)  . COPD (chronic obstructive pulmonary disease) ([place])   . Dementia    "more than mild; having more short term memory loss recently; on Aricept" (02/09/2013)  . Diverticulosis   . Dizziness    chronic  . Family history of anesthesia complication    "makes daughter sick" (02/09/2013)  . Frequent PVCs   . Frequent UTI   . GERD (gastroesophageal reflux disease)   . AOZHYQMV(784.6)    "sometimes weekly" (02/09/2013)  . High cholesterol   . Hypertension   . Mental disorder    mild dementia  . Orthostatic hypotension   . Osteopenia   . Pneumonia    "couple times; several  years ago" (02/09/2013)  . Sinus bradycardia    s/p STJ Assurity dual chamber pacemaker by Dr Rayann Heman 02/2013  . Stroke ([place])    20-30 yrs ago  . Syncope     Negative even recorder dated 3/08  . Tobacco abuse    60 pack years; continuing    Patient Active Problem List   Diagnosis Date Noted  . Acute blood loss anemia 04/11/2015  . Chronic chest pain 04/11/2015  . Dementia 04/11/2015  . Lower GI bleed 04/06/2015  . Pacemaker 05/27/2014  . Balance disorder 10/05/2013  . Dizziness of unknown cause 10/05/2013  . Difficulty in walking(719.7) 10/05/2013  . Muscle weakness (generalized) 10/05/2013  . Hypokalemia 09/21/2013  . Acute bronchitis 09/20/2013  . SIRS (systemic inflammatory response syndrome) ([place]) 09/20/2013  . Atypical chest pain 09/20/2013  . UTI (lower urinary tract infection) 09/20/2013  . Dyspnea 09/20/2013  . Anemia 02/09/2013  . Sick sinus syndrome ([place]) 01/27/2013  . Premature ventricular contraction 01/27/2013  . CAD (coronary artery disease) 01/27/2013  . Essential hypertension 01/27/2013  . Hx of falling, presenting hazards to health 12/21/2012  . Laboratory test 05/09/2011  . Arteriosclerotic cardiovascular disease (ASCVD)   . Cerebrovascular disease   . Syncope   . Hyperlipidemia 05/07/2009  .  Tobacco abuse 05/07/2009  . Orthostatic hypotension 05/07/2009  . GERD (gastroesophageal reflux disease) 09/19/2008  . Osteopenia 09/19/2008    Past Surgical History:  Procedure Laterality Date  . ABDOMINAL HYSTERECTOMY  1990  . APPENDECTOMY    . CARDIAC CATHETERIZATION  2007  . CATARACT EXTRACTION, BILATERAL    . COLONOSCOPY  10/10/2011   Procedure: COLONOSCOPY;  Surgeon: Rogene Houston, MD;  [facility];  Service: Endoscopy;  Laterality: N/A;  1230  . COLONOSCOPY N/A 04/12/2015   Procedure: COLONOSCOPY;  Surgeon: Rogene Houston, MD;  Location: [facility];  Service: Endoscopy;  Laterality: N/A;  . CORONARY ARTERY BYPASS GRAFT  2007   Left main  disease  . ESOPHAGOGASTRODUODENOSCOPY  10/10/2011   Procedure: ESOPHAGOGASTRODUODENOSCOPY (EGD);  Surgeon: Rogene Houston, MD;  Location: [facility];  Service: Endoscopy;  Laterality: N/A;  . ESOPHAGOGASTRODUODENOSCOPY N/A 04/08/2015   Procedure: ESOPHAGOGASTRODUODENOSCOPY (EGD);  Surgeon: Rogene Houston, MD;  Location: [facility];  Service: Endoscopy;  Laterality: N/A;  . HERNIA REPAIR    . PACEMAKER INSERTION  02/09/2013   STJ Assurity pacemaker implanted by Dr Rayann Heman for symptomatic bradycardia  . PERMANENT PACEMAKER INSERTION N/A 02/09/2013   Procedure: PERMANENT PACEMAKER INSERTION;  Surgeon: Coralyn Mark, MD;  Location: [place] CATH LAB;  Service: Cardiovascular;  Laterality: N/A;  . TONSILLECTOMY    . VENTRAL HERNIA REPAIR      OB History    Gravida Para Term Preterm AB Living   3 3 3          SAB TAB Ectopic Multiple Live Births                   Home Medications    Prior to Admission medications   Medication Sig Start Date End Date Taking? Authorizing Provider  acetaminophen (TYLENOL) 325 MG tablet Take 650 mg by mouth every 6 (six) hours as needed for mild pain. For pain    [provider]  busPIRone (BUSPAR) 5 MG tablet  04/14/15   [provider]  Calcium-Vitamin D (CALTRATE 600 PLUS-VIT D PO) Take 1 tablet by mouth at bedtime.     [provider]  Cyanocobalamin (B-12 COMPLIANCE INJECTION IJ) Inject 1 Dose as directed every 30 (thirty) days.    [provider]  Cyanocobalamin (VITAMIN B-12 CR PO) Take 1 tablet by mouth 2 (two) times daily.    [provider]  donepezil (ARICEPT) 10 MG tablet Take 10 mg by mouth daily.     [provider]  ferrous sulfate 325 (65 FE) MG tablet Take 1 tablet (325 mg total) by mouth 2 (two) times daily with a meal. 04/09/15   Regalado, [name], MD  Multiple Vitamins-Minerals (PRESERVISION AREDS PO) Take 1 capsule by mouth 2 (two) times daily.    [provider]  Omega-3 Fatty Acids  (FISH OIL) 1200 MG CAPS Take 1,200 mg by mouth at bedtime.     [provider]  omeprazole (PRILOSEC) 20 MG capsule Take 2 capsules (40 mg total) by mouth daily. Patient taking differently: Take 20 mg by mouth 2 (two) times daily before a meal.  04/09/15   Regalado, [name], MD  simethicone (MYLICON) 80 MG chewable tablet Chew 1 tablet (80 mg total) by mouth every 6 (six) hours as needed for flatulence. 04/09/15   Regalado, [name], MD  zoledronic acid (RECLAST) 5 MG/100ML SOLN injection Inject 5 mg into the vein once.    [provider]  Family History Family History  Problem Relation Age of Onset  . Angina Mother   . Heart attack Mother   . Heart disease Unknown   . Arthritis Unknown   . Lung disease Unknown   . Asthma Unknown     Social History Social History   Tobacco Use  . Smoking status: Former Smoker    Packs/day: 0.50    Years: 60.00    Pack years: 30.00    Types: Cigarettes    Start date: 12/12/1952    Last attempt to quit: 10/20/2006    Years since quitting: 10.2  . Smokeless tobacco: Never Used  . Tobacco comment: 02/09/2013 "quit smoking in ~ 2009"  Substance Use Topics  . Alcohol use: No    Alcohol/week: 0.0 oz  . Drug use: No     Allergies   Ativan [lorazepam]; Cephalexin; Contrast media [iodinated diagnostic agents]; Haldol [haloperidol lactate]; Iohexol; Penicillins; Shellfish allergy; and Sulfa antibiotics   Review of Systems Review of Systems  Unable to perform ROS: Dementia     Physical Exam Updated Vital Signs BP (!) 163/108   Pulse 76   Temp 98.3 F (36.8 C) (Oral)   Resp 16   Ht 1.676 m (5\' 6" )   Wt 56.2 kg (124 lb)   SpO2 99%   BMI 20.01 kg/m   Physical Exam  Constitutional: She appears well-developed and well-nourished. No distress.  HENT:  Head: Normocephalic and atraumatic.  Mucous membranes slightly dry.  Eyes: Conjunctivae and EOM are normal. Pupils are equal, round, and reactive to light.  Neck: Normal  range of motion. Neck supple.  Cardiovascular: Normal rate, regular rhythm and normal heart sounds.  Pulmonary/Chest: Effort normal and breath sounds normal. No respiratory distress.  Abdominal: Soft. Bowel sounds are normal. There is no tenderness.  Musculoskeletal: Normal range of motion. She exhibits no edema, tenderness or deformity.  Good range of motion of the hips without any pain.  Neurological: She is alert. No cranial nerve deficit. Coordination normal.  Patient very weak in her lower extremities.  Bilaterally.  Increased strength in the upper arms.  Skin: Skin is warm.  Nursing note and vitals reviewed.    ED Treatments / Results  Labs (all labs ordered are listed, but only abnormal results are displayed) Labs Reviewed  COMPREHENSIVE METABOLIC PANEL - Abnormal; Notable for the following components:      Result Value   Glucose, Bld 108 (*)    All other components within normal limits  CBC WITH DIFFERENTIAL/PLATELET - Abnormal; Notable for the following components:   WBC 18.6 (*)    RBC 5.19 (*)    Hemoglobin 16.0 (*)    HCT 49.3 (*)    Neutro Abs 16.2 (*)    Monocytes Absolute 1.5 (*)    All other components within normal limits  LIPASE, BLOOD  URINALYSIS, ROUTINE W REFLEX MICROSCOPIC    EKG  EKG Interpretation None       Radiology Dg Chest 2 View  Result Date: 01/30/2017 CLINICAL DATA:  Fall and confusion. EXAM: CHEST  2 VIEW COMPARISON:  09/16/2016 FINDINGS: Stable appearance of the left dual chamber cardiac pacemaker. Heart size is upper limits of normal and stable. Trachea is midline. Both lungs are clear without airspace disease or pulmonary edema. Negative for a pneumothorax. Post CABG changes. No large pleural effusions. IMPRESSION: No active cardiopulmonary disease. Electronically Signed   By: Markus Daft M.D.   On: 01/30/2017 20:37   Ct Head Wo Contrast  Result  Date: 01/30/2017 CLINICAL DATA:  84 year old female with difficulty walking. Right side  pain. EXAM: CT HEAD WITHOUT CONTRAST CT CERVICAL SPINE WITHOUT CONTRAST TECHNIQUE: Multidetector CT imaging of the head and cervical spine was performed following the standard protocol without intravenous contrast. Multiplanar CT image reconstructions of the cervical spine were also generated. COMPARISON:  Head CT 09/16/2016.  Brain MR 01/15/2007. FINDINGS: CT HEAD FINDINGS Brain: Chronic encephalomalacia in the superior right peri-Rolandic cortex. Superimposed bilateral confluent cerebral white matter hypodensity, including involvement in the deep white matter capsules. Gray-white matter differentiation in the deep gray matter nuclei, brainstem, and cerebellum relatively preserved. Chronic ex vacuo appearing enlargement of the ventricles. No midline shift, mass effect, or evidence of intracranial mass lesion. No acute intracranial hemorrhage identified. No cortically based acute infarct identified. Vascular: Calcified atherosclerosis at the skull base. No suspicious intracranial vascular hyperdensity. Skull: Osteopenia. Stable. No acute osseous abnormality identified. Sinuses/Orbits: Visualized paranasal sinuses and mastoids are stable and well pneumatized. Other: There is increased scalp soft tissue density along the posterior vertex up to 8 millimeters in thick Padda bowl with a small hematoma (series 4, image 62). Underlying calvarium intact. Superimposed superficial soft tissue contusion suspected more inferiorly on image 41. No other acute scalp or orbits soft tissue findings. CT CERVICAL SPINE FINDINGS Alignment: Straightening and reversal of cervical lordosis, with 2-3 millimeters of anterolisthesis of C3 on C4 and C4 on C5. Associated chronic facet hypertrophy, more so on the left. Bilateral posterior element alignment is within normal limits. Cervicothoracic junction alignment is within normal limits. Skull base and vertebrae: Visualized skull base is intact. No atlanto-occipital dissociation. No cervical  spine fracture identified. Soft tissues and spinal canal: No prevertebral fluid or swelling. No visible canal hematoma. Negative noncontrast neck soft tissues. Disc levels: Degenerative ligamentous hypertrophy about the odontoid. Bulky upper cervical facet hypertrophy and severe lower cervical disc degeneration with bulky endplate spurring. Up to mild degenerative cervical spinal stenosis at C4-C5 and C5-C6. Upper chest: Upper thoracic levels appear intact. Apical lung scarring. Partially visible left subclavian approach cardiac pacemaker type leads. IMPRESSION: 1. Left posterior scalp mild contusion and hematoma. No underlying skull fracture. 2. No acute intracranial abnormality and stable non contrast CT appearance of the brain since 2018. 3. No acute fracture or listhesis identified in the cervical spine. Advanced cervical spine degeneration. Electronically Signed   By: Genevie Ann M.D.   On: 01/30/2017 21:11   Ct Cervical Spine Wo Contrast  Result Date: 01/30/2017 CLINICAL DATA:  84 year old female with difficulty walking. Right side pain. EXAM: CT HEAD WITHOUT CONTRAST CT CERVICAL SPINE WITHOUT CONTRAST TECHNIQUE: Multidetector CT imaging of the head and cervical spine was performed following the standard protocol without intravenous contrast. Multiplanar CT image reconstructions of the cervical spine were also generated. COMPARISON:  Head CT 09/16/2016.  Brain MR 01/15/2007. FINDINGS: CT HEAD FINDINGS Brain: Chronic encephalomalacia in the superior right peri-Rolandic cortex. Superimposed bilateral confluent cerebral white matter hypodensity, including involvement in the deep white matter capsules. Gray-white matter differentiation in the deep gray matter nuclei, brainstem, and cerebellum relatively preserved. Chronic ex vacuo appearing enlargement of the ventricles. No midline shift, mass effect, or evidence of intracranial mass lesion. No acute intracranial hemorrhage identified. No cortically based acute  infarct identified. Vascular: Calcified atherosclerosis at the skull base. No suspicious intracranial vascular hyperdensity. Skull: Osteopenia. Stable. No acute osseous abnormality identified. Sinuses/Orbits: Visualized paranasal sinuses and mastoids are stable and well pneumatized. Other: There is increased scalp soft tissue density along the posterior vertex  up to 8 millimeters in thick Padda bowl with a small hematoma (series 4, image 62). Underlying calvarium intact. Superimposed superficial soft tissue contusion suspected more inferiorly on image 41. No other acute scalp or orbits soft tissue findings. CT CERVICAL SPINE FINDINGS Alignment: Straightening and reversal of cervical lordosis, with 2-3 millimeters of anterolisthesis of C3 on C4 and C4 on C5. Associated chronic facet hypertrophy, more so on the left. Bilateral posterior element alignment is within normal limits. Cervicothoracic junction alignment is within normal limits. Skull base and vertebrae: Visualized skull base is intact. No atlanto-occipital dissociation. No cervical spine fracture identified. Soft tissues and spinal canal: No prevertebral fluid or swelling. No visible canal hematoma. Negative noncontrast neck soft tissues. Disc levels: Degenerative ligamentous hypertrophy about the odontoid. Bulky upper cervical facet hypertrophy and severe lower cervical disc degeneration with bulky endplate spurring. Up to mild degenerative cervical spinal stenosis at C4-C5 and C5-C6. Upper chest: Upper thoracic levels appear intact. Apical lung scarring. Partially visible left subclavian approach cardiac pacemaker type leads. IMPRESSION: 1. Left posterior scalp mild contusion and hematoma. No underlying skull fracture. 2. No acute intracranial abnormality and stable non contrast CT appearance of the brain since 2018. 3. No acute fracture or listhesis identified in the cervical spine. Advanced cervical spine degeneration. Electronically Signed   By: Genevie Ann  M.D.   On: 01/30/2017 21:11   Dg Hips Bilat W Or Wo Pelvis 3-4 Views  Result Date: 01/30/2017 CLINICAL DATA:  Fall and confusion EXAM: DG HIP (WITH OR WITHOUT PELVIS) 3-4V BILAT COMPARISON:  None. FINDINGS: There is no evidence of hip fracture or dislocation. There is no evidence of arthropathy or other focal bone abnormality. IMPRESSION: No acute fracture or dislocation of the hips or pelvis. Electronically Signed   By: Ulyses Jarred M.D.   On: 01/30/2017 20:42    Procedures Procedures (including critical care time)  Medications Ordered in ED Medications  0.9 %  sodium chloride infusion ( Intravenous New Bag/Given 01/30/17 1954)  sodium chloride 0.9 % bolus 500 mL (500 mLs Intravenous New Bag/Given 01/30/17 1954)     Initial Impression / Assessment and Plan / ED Course  I have reviewed the triage vital signs and the nursing notes.  Pertinent labs & imaging results that were available during my care of the patient were reviewed by me and considered in my medical decision making (see chart for details).     Patient has a known history of dementia.  Is been frequent falls.  Patient also not eating well.'s of some component of failure to thrive.  No obvious focal neuro deficits.  No obvious trauma.  CT head neck without any acute findings.  Chest x-ray negative x-rays of both hips and pelvis negative for any bony injuries.  Patient's labs are still pending but her CBC is back and she has a marked leukocytosis.  Will be looking for possible infection or electrolyte abnormalities.  Urinalysis prominent.  Family is in a difficult situation because patient is now become nonambulatory.  Difficult for them to care for her.  Hopefully based on the marked leukocytosis we may find a indication for admission.  Final Clinical Impressions(s) / ED Diagnoses   Final diagnoses:  Falls frequently  Generalized weakness  Dementia without behavioral disturbance, unspecified dementia type    ED  Discharge Orders    None       Fredia Sorrow, MD 01/30/17 2133   Cath urine has been sent to the lab.  Still pending.  Patient's electrolytes without any significant abnormalities.   Fredia Sorrow, MD 01/30/17 2152  Discussed with patient's family members.  If urinalysis is negative patient will be discharged home.  They will work with primary care doctor towards additional home health resources and perhaps facility placement if needed.    Fredia Sorrow, MD 01/30/17 [phone]

## 2017-01-30 NOTE — ED Notes (Signed)
Pt to Ct and xray

## 2017-01-30 NOTE — ED Notes (Signed)
Blood work will be drawn once pt back from CT and xray

## 2017-02-02 DIAGNOSIS — M169 Osteoarthritis of hip, unspecified: Secondary | ICD-10-CM | POA: Diagnosis not present

## 2017-02-03 ENCOUNTER — Ambulatory Visit (INDEPENDENT_AMBULATORY_CARE_PROVIDER_SITE_OTHER): Payer: Medicare Other | Admitting: *Deleted

## 2017-02-03 DIAGNOSIS — I495 Sick sinus syndrome: Secondary | ICD-10-CM | POA: Diagnosis not present

## 2017-02-03 NOTE — Progress Notes (Signed)
Remote pacemaker transmission.   

## 2017-02-05 ENCOUNTER — Encounter: Payer: Self-pay | Admitting: Cardiology

## 2017-02-06 DIAGNOSIS — I2510 Atherosclerotic heart disease of native coronary artery without angina pectoris: Secondary | ICD-10-CM | POA: Diagnosis not present

## 2017-02-06 DIAGNOSIS — M5136 Other intervertebral disc degeneration, lumbar region: Secondary | ICD-10-CM | POA: Diagnosis not present

## 2017-02-06 DIAGNOSIS — N390 Urinary tract infection, site not specified: Secondary | ICD-10-CM | POA: Diagnosis not present

## 2017-02-06 DIAGNOSIS — E860 Dehydration: Secondary | ICD-10-CM | POA: Diagnosis not present

## 2017-02-06 DIAGNOSIS — Z1389 Encounter for screening for other disorder: Secondary | ICD-10-CM | POA: Diagnosis not present

## 2017-02-06 LAB — CUP PACEART REMOTE DEVICE CHECK
Brady Statistic AP VP Percent: 1 %
Brady Statistic AS VP Percent: 1 %
Brady Statistic AS VS Percent: 47 %
Brady Statistic RA Percent Paced: 38 %
Brady Statistic RV Percent Paced: 1 %
Implantable Lead Implant Date: 20150203
Implantable Lead Location: 753860
Implantable Pulse Generator Implant Date: 20150203
Lead Channel Impedance Value: 600 Ohm
Lead Channel Pacing Threshold Pulse Width: 0.4 ms
Lead Channel Sensing Intrinsic Amplitude: 12 mV
Lead Channel Setting Pacing Amplitude: 2 V
Lead Channel Setting Pacing Amplitude: 2.5 V
Lead Channel Setting Sensing Sensitivity: 2 mV
MDC IDC LEAD IMPLANT DT: 20150203
MDC IDC LEAD LOCATION: 753859
MDC IDC MSMT BATTERY REMAINING LONGEVITY: 124 mo
MDC IDC MSMT BATTERY REMAINING PERCENTAGE: 95.5 %
MDC IDC MSMT BATTERY VOLTAGE: 3.01 V
MDC IDC MSMT LEADCHNL RA IMPEDANCE VALUE: 510 Ohm
MDC IDC MSMT LEADCHNL RA PACING THRESHOLD AMPLITUDE: 0.75 V
MDC IDC MSMT LEADCHNL RA PACING THRESHOLD PULSEWIDTH: 0.4 ms
MDC IDC MSMT LEADCHNL RA SENSING INTR AMPL: 2.1 mV
MDC IDC MSMT LEADCHNL RV PACING THRESHOLD AMPLITUDE: 1 V
MDC IDC PG SERIAL: 7586568
MDC IDC SESS DTM: 20190128070012
MDC IDC SET LEADCHNL RV PACING PULSEWIDTH: 0.4 ms
MDC IDC STAT BRADY AP VS PERCENT: 47 %

## 2017-02-18 DIAGNOSIS — I2510 Atherosclerotic heart disease of native coronary artery without angina pectoris: Secondary | ICD-10-CM | POA: Diagnosis not present

## 2017-02-18 DIAGNOSIS — I10 Essential (primary) hypertension: Secondary | ICD-10-CM | POA: Diagnosis not present

## 2017-02-18 DIAGNOSIS — M1991 Primary osteoarthritis, unspecified site: Secondary | ICD-10-CM | POA: Diagnosis not present

## 2017-02-18 DIAGNOSIS — I4891 Unspecified atrial fibrillation: Secondary | ICD-10-CM | POA: Diagnosis not present

## 2017-02-18 DIAGNOSIS — H3530 Unspecified macular degeneration: Secondary | ICD-10-CM | POA: Diagnosis not present

## 2017-02-18 DIAGNOSIS — M4180 Other forms of scoliosis, site unspecified: Secondary | ICD-10-CM | POA: Diagnosis not present

## 2017-02-18 DIAGNOSIS — Z950 Presence of cardiac pacemaker: Secondary | ICD-10-CM | POA: Diagnosis not present

## 2017-02-18 DIAGNOSIS — M5136 Other intervertebral disc degeneration, lumbar region: Secondary | ICD-10-CM | POA: Diagnosis not present

## 2017-02-18 DIAGNOSIS — Z9181 History of falling: Secondary | ICD-10-CM | POA: Diagnosis not present

## 2017-02-19 DIAGNOSIS — Z950 Presence of cardiac pacemaker: Secondary | ICD-10-CM | POA: Diagnosis not present

## 2017-02-19 DIAGNOSIS — M1991 Primary osteoarthritis, unspecified site: Secondary | ICD-10-CM | POA: Diagnosis not present

## 2017-02-19 DIAGNOSIS — M5136 Other intervertebral disc degeneration, lumbar region: Secondary | ICD-10-CM | POA: Diagnosis not present

## 2017-02-19 DIAGNOSIS — H3530 Unspecified macular degeneration: Secondary | ICD-10-CM | POA: Diagnosis not present

## 2017-02-19 DIAGNOSIS — Z9181 History of falling: Secondary | ICD-10-CM | POA: Diagnosis not present

## 2017-02-19 DIAGNOSIS — I4891 Unspecified atrial fibrillation: Secondary | ICD-10-CM | POA: Diagnosis not present

## 2017-02-19 DIAGNOSIS — I2510 Atherosclerotic heart disease of native coronary artery without angina pectoris: Secondary | ICD-10-CM | POA: Diagnosis not present

## 2017-02-19 DIAGNOSIS — M4180 Other forms of scoliosis, site unspecified: Secondary | ICD-10-CM | POA: Diagnosis not present

## 2017-02-19 DIAGNOSIS — I10 Essential (primary) hypertension: Secondary | ICD-10-CM | POA: Diagnosis not present

## 2017-02-21 DIAGNOSIS — M5136 Other intervertebral disc degeneration, lumbar region: Secondary | ICD-10-CM | POA: Diagnosis not present

## 2017-02-21 DIAGNOSIS — M1991 Primary osteoarthritis, unspecified site: Secondary | ICD-10-CM | POA: Diagnosis not present

## 2017-02-21 DIAGNOSIS — I4891 Unspecified atrial fibrillation: Secondary | ICD-10-CM | POA: Diagnosis not present

## 2017-02-21 DIAGNOSIS — H3530 Unspecified macular degeneration: Secondary | ICD-10-CM | POA: Diagnosis not present

## 2017-02-21 DIAGNOSIS — Z950 Presence of cardiac pacemaker: Secondary | ICD-10-CM | POA: Diagnosis not present

## 2017-02-21 DIAGNOSIS — I10 Essential (primary) hypertension: Secondary | ICD-10-CM | POA: Diagnosis not present

## 2017-02-21 DIAGNOSIS — M4180 Other forms of scoliosis, site unspecified: Secondary | ICD-10-CM | POA: Diagnosis not present

## 2017-02-21 DIAGNOSIS — Z9181 History of falling: Secondary | ICD-10-CM | POA: Diagnosis not present

## 2017-02-21 DIAGNOSIS — I2510 Atherosclerotic heart disease of native coronary artery without angina pectoris: Secondary | ICD-10-CM | POA: Diagnosis not present

## 2017-02-25 DIAGNOSIS — M5136 Other intervertebral disc degeneration, lumbar region: Secondary | ICD-10-CM | POA: Diagnosis not present

## 2017-02-25 DIAGNOSIS — I4891 Unspecified atrial fibrillation: Secondary | ICD-10-CM | POA: Diagnosis not present

## 2017-02-25 DIAGNOSIS — M1991 Primary osteoarthritis, unspecified site: Secondary | ICD-10-CM | POA: Diagnosis not present

## 2017-02-25 DIAGNOSIS — Z9181 History of falling: Secondary | ICD-10-CM | POA: Diagnosis not present

## 2017-02-25 DIAGNOSIS — H3530 Unspecified macular degeneration: Secondary | ICD-10-CM | POA: Diagnosis not present

## 2017-02-25 DIAGNOSIS — M4180 Other forms of scoliosis, site unspecified: Secondary | ICD-10-CM | POA: Diagnosis not present

## 2017-02-25 DIAGNOSIS — I2510 Atherosclerotic heart disease of native coronary artery without angina pectoris: Secondary | ICD-10-CM | POA: Diagnosis not present

## 2017-02-25 DIAGNOSIS — Z950 Presence of cardiac pacemaker: Secondary | ICD-10-CM | POA: Diagnosis not present

## 2017-02-25 DIAGNOSIS — I10 Essential (primary) hypertension: Secondary | ICD-10-CM | POA: Diagnosis not present

## 2017-02-26 ENCOUNTER — Ambulatory Visit (INDEPENDENT_AMBULATORY_CARE_PROVIDER_SITE_OTHER): Payer: Medicare Other | Admitting: Internal Medicine

## 2017-02-26 DIAGNOSIS — Z9181 History of falling: Secondary | ICD-10-CM | POA: Diagnosis not present

## 2017-02-26 DIAGNOSIS — M5136 Other intervertebral disc degeneration, lumbar region: Secondary | ICD-10-CM | POA: Diagnosis not present

## 2017-02-26 DIAGNOSIS — Z950 Presence of cardiac pacemaker: Secondary | ICD-10-CM | POA: Diagnosis not present

## 2017-02-26 DIAGNOSIS — M4180 Other forms of scoliosis, site unspecified: Secondary | ICD-10-CM | POA: Diagnosis not present

## 2017-02-26 DIAGNOSIS — M1991 Primary osteoarthritis, unspecified site: Secondary | ICD-10-CM | POA: Diagnosis not present

## 2017-02-26 DIAGNOSIS — I2510 Atherosclerotic heart disease of native coronary artery without angina pectoris: Secondary | ICD-10-CM | POA: Diagnosis not present

## 2017-02-26 DIAGNOSIS — I10 Essential (primary) hypertension: Secondary | ICD-10-CM | POA: Diagnosis not present

## 2017-02-26 DIAGNOSIS — H3530 Unspecified macular degeneration: Secondary | ICD-10-CM | POA: Diagnosis not present

## 2017-02-26 DIAGNOSIS — I4891 Unspecified atrial fibrillation: Secondary | ICD-10-CM | POA: Diagnosis not present

## 2017-02-27 DIAGNOSIS — Z9181 History of falling: Secondary | ICD-10-CM | POA: Diagnosis not present

## 2017-02-27 DIAGNOSIS — I4891 Unspecified atrial fibrillation: Secondary | ICD-10-CM | POA: Diagnosis not present

## 2017-02-27 DIAGNOSIS — M4180 Other forms of scoliosis, site unspecified: Secondary | ICD-10-CM | POA: Diagnosis not present

## 2017-02-27 DIAGNOSIS — I2510 Atherosclerotic heart disease of native coronary artery without angina pectoris: Secondary | ICD-10-CM | POA: Diagnosis not present

## 2017-02-27 DIAGNOSIS — M5136 Other intervertebral disc degeneration, lumbar region: Secondary | ICD-10-CM | POA: Diagnosis not present

## 2017-02-27 DIAGNOSIS — H3530 Unspecified macular degeneration: Secondary | ICD-10-CM | POA: Diagnosis not present

## 2017-02-27 DIAGNOSIS — M1991 Primary osteoarthritis, unspecified site: Secondary | ICD-10-CM | POA: Diagnosis not present

## 2017-02-27 DIAGNOSIS — Z950 Presence of cardiac pacemaker: Secondary | ICD-10-CM | POA: Diagnosis not present

## 2017-02-27 DIAGNOSIS — I10 Essential (primary) hypertension: Secondary | ICD-10-CM | POA: Diagnosis not present

## 2017-02-28 DIAGNOSIS — Z9181 History of falling: Secondary | ICD-10-CM | POA: Diagnosis not present

## 2017-02-28 DIAGNOSIS — M4180 Other forms of scoliosis, site unspecified: Secondary | ICD-10-CM | POA: Diagnosis not present

## 2017-02-28 DIAGNOSIS — H3530 Unspecified macular degeneration: Secondary | ICD-10-CM | POA: Diagnosis not present

## 2017-02-28 DIAGNOSIS — I10 Essential (primary) hypertension: Secondary | ICD-10-CM | POA: Diagnosis not present

## 2017-02-28 DIAGNOSIS — Z950 Presence of cardiac pacemaker: Secondary | ICD-10-CM | POA: Diagnosis not present

## 2017-02-28 DIAGNOSIS — M1991 Primary osteoarthritis, unspecified site: Secondary | ICD-10-CM | POA: Diagnosis not present

## 2017-02-28 DIAGNOSIS — M5136 Other intervertebral disc degeneration, lumbar region: Secondary | ICD-10-CM | POA: Diagnosis not present

## 2017-02-28 DIAGNOSIS — I2510 Atherosclerotic heart disease of native coronary artery without angina pectoris: Secondary | ICD-10-CM | POA: Diagnosis not present

## 2017-02-28 DIAGNOSIS — I4891 Unspecified atrial fibrillation: Secondary | ICD-10-CM | POA: Diagnosis not present

## 2017-03-04 DIAGNOSIS — Z9181 History of falling: Secondary | ICD-10-CM | POA: Diagnosis not present

## 2017-03-04 DIAGNOSIS — M4180 Other forms of scoliosis, site unspecified: Secondary | ICD-10-CM | POA: Diagnosis not present

## 2017-03-04 DIAGNOSIS — I2510 Atherosclerotic heart disease of native coronary artery without angina pectoris: Secondary | ICD-10-CM | POA: Diagnosis not present

## 2017-03-04 DIAGNOSIS — I4891 Unspecified atrial fibrillation: Secondary | ICD-10-CM | POA: Diagnosis not present

## 2017-03-04 DIAGNOSIS — M5136 Other intervertebral disc degeneration, lumbar region: Secondary | ICD-10-CM | POA: Diagnosis not present

## 2017-03-04 DIAGNOSIS — I10 Essential (primary) hypertension: Secondary | ICD-10-CM | POA: Diagnosis not present

## 2017-03-04 DIAGNOSIS — Z950 Presence of cardiac pacemaker: Secondary | ICD-10-CM | POA: Diagnosis not present

## 2017-03-04 DIAGNOSIS — H3530 Unspecified macular degeneration: Secondary | ICD-10-CM | POA: Diagnosis not present

## 2017-03-04 DIAGNOSIS — M1991 Primary osteoarthritis, unspecified site: Secondary | ICD-10-CM | POA: Diagnosis not present

## 2017-03-05 DIAGNOSIS — M1991 Primary osteoarthritis, unspecified site: Secondary | ICD-10-CM | POA: Diagnosis not present

## 2017-03-05 DIAGNOSIS — M4180 Other forms of scoliosis, site unspecified: Secondary | ICD-10-CM | POA: Diagnosis not present

## 2017-03-05 DIAGNOSIS — I10 Essential (primary) hypertension: Secondary | ICD-10-CM | POA: Diagnosis not present

## 2017-03-05 DIAGNOSIS — Z9181 History of falling: Secondary | ICD-10-CM | POA: Diagnosis not present

## 2017-03-05 DIAGNOSIS — I4891 Unspecified atrial fibrillation: Secondary | ICD-10-CM | POA: Diagnosis not present

## 2017-03-05 DIAGNOSIS — I2510 Atherosclerotic heart disease of native coronary artery without angina pectoris: Secondary | ICD-10-CM | POA: Diagnosis not present

## 2017-03-05 DIAGNOSIS — M169 Osteoarthritis of hip, unspecified: Secondary | ICD-10-CM | POA: Diagnosis not present

## 2017-03-05 DIAGNOSIS — Z950 Presence of cardiac pacemaker: Secondary | ICD-10-CM | POA: Diagnosis not present

## 2017-03-05 DIAGNOSIS — M5136 Other intervertebral disc degeneration, lumbar region: Secondary | ICD-10-CM | POA: Diagnosis not present

## 2017-03-05 DIAGNOSIS — H3530 Unspecified macular degeneration: Secondary | ICD-10-CM | POA: Diagnosis not present

## 2017-03-07 DIAGNOSIS — I10 Essential (primary) hypertension: Secondary | ICD-10-CM | POA: Diagnosis not present

## 2017-03-07 DIAGNOSIS — Z9181 History of falling: Secondary | ICD-10-CM | POA: Diagnosis not present

## 2017-03-07 DIAGNOSIS — H3530 Unspecified macular degeneration: Secondary | ICD-10-CM | POA: Diagnosis not present

## 2017-03-07 DIAGNOSIS — Z950 Presence of cardiac pacemaker: Secondary | ICD-10-CM | POA: Diagnosis not present

## 2017-03-07 DIAGNOSIS — I4891 Unspecified atrial fibrillation: Secondary | ICD-10-CM | POA: Diagnosis not present

## 2017-03-07 DIAGNOSIS — M5136 Other intervertebral disc degeneration, lumbar region: Secondary | ICD-10-CM | POA: Diagnosis not present

## 2017-03-07 DIAGNOSIS — M1991 Primary osteoarthritis, unspecified site: Secondary | ICD-10-CM | POA: Diagnosis not present

## 2017-03-07 DIAGNOSIS — M4180 Other forms of scoliosis, site unspecified: Secondary | ICD-10-CM | POA: Diagnosis not present

## 2017-03-07 DIAGNOSIS — I2510 Atherosclerotic heart disease of native coronary artery without angina pectoris: Secondary | ICD-10-CM | POA: Diagnosis not present

## 2017-03-10 ENCOUNTER — Encounter (INDEPENDENT_AMBULATORY_CARE_PROVIDER_SITE_OTHER): Payer: Self-pay | Admitting: Internal Medicine

## 2017-03-10 ENCOUNTER — Ambulatory Visit (INDEPENDENT_AMBULATORY_CARE_PROVIDER_SITE_OTHER): Payer: Medicare Other | Admitting: Internal Medicine

## 2017-03-10 VITALS — BP 160/82 | HR 72 | Temp 97.5°F | Ht 64.0 in | Wt 125.0 lb

## 2017-03-10 DIAGNOSIS — I10 Essential (primary) hypertension: Secondary | ICD-10-CM | POA: Diagnosis not present

## 2017-03-10 DIAGNOSIS — I2510 Atherosclerotic heart disease of native coronary artery without angina pectoris: Secondary | ICD-10-CM | POA: Diagnosis not present

## 2017-03-10 DIAGNOSIS — M5136 Other intervertebral disc degeneration, lumbar region: Secondary | ICD-10-CM | POA: Diagnosis not present

## 2017-03-10 DIAGNOSIS — Z9181 History of falling: Secondary | ICD-10-CM | POA: Diagnosis not present

## 2017-03-10 DIAGNOSIS — K922 Gastrointestinal hemorrhage, unspecified: Secondary | ICD-10-CM | POA: Diagnosis not present

## 2017-03-10 DIAGNOSIS — I4891 Unspecified atrial fibrillation: Secondary | ICD-10-CM | POA: Diagnosis not present

## 2017-03-10 DIAGNOSIS — M4180 Other forms of scoliosis, site unspecified: Secondary | ICD-10-CM | POA: Diagnosis not present

## 2017-03-10 DIAGNOSIS — M1991 Primary osteoarthritis, unspecified site: Secondary | ICD-10-CM | POA: Diagnosis not present

## 2017-03-10 DIAGNOSIS — H3530 Unspecified macular degeneration: Secondary | ICD-10-CM | POA: Diagnosis not present

## 2017-03-10 DIAGNOSIS — Z950 Presence of cardiac pacemaker: Secondary | ICD-10-CM | POA: Diagnosis not present

## 2017-03-10 NOTE — Progress Notes (Signed)
Subjective:    Patient ID: [name], female    DOB: 04/06/1925, 84 y.o.   MRN: [id number]  HPI Here today for f/u. PCP Dr. Hilma Favors. Last seen in February of 2018. Hx of GIB.  Admitted to [facility]12/2015 for chest pain and SOB. Had maroon stools and was guaiac positive. She is in a walker. She has frequent fall.  She has not had any rectal bleeding as far as her daughter know. Her hemoglobin 01/30/2017 16.0. Her appetite is good. No weight loss. She has a caregiver coming in to check on her. She lives with husband. She has a BM about 3-4 times a week.   EGD by Dr. Laural Golden 04/08/2015 for chronic blood loss. Normal esophagus. Gastric mucosal atrophy. No bleeding source identified in upper GI tract. On 04/12/2015 she underwent a colonoscopy which revealed 2 non bleeding colonic angiodysplastic lesions at ascending colon. Diverticulosis in the sigmoid colon without stigmata of active bleeding, but felt to be source of GI bleed.  Patient was evaluated for GI bleed and anemia in October 2013 and underwent EGD and colonoscopy. EGD was normal and colonoscopy revealed few small left-sided AV malformations in diverticulosis.  CBC    Component Value Date/Time   WBC 18.6 (H) 01/30/2017 2107   RBC 5.19 (H) 01/30/2017 2107   HGB 16.0 (H) 01/30/2017 2107   HCT 49.3 (H) 01/30/2017 2107   PLT 176 01/30/2017 2107   MCV 95.0 01/30/2017 2107   MCH 30.8 01/30/2017 2107   MCHC 32.5 01/30/2017 2107   RDW 12.6 01/30/2017 2107   LYMPHSABS 0.9 01/30/2017 2107   MONOABS 1.5 (H) 01/30/2017 2107   EOSABS 0.0 01/30/2017 2107   BASOSABS 0.0 01/30/2017 2107    Review of Systems   Past Medical History:  Diagnosis Date  . Anemia   . Arthritis    "fingers; back" (02/09/2013)  . Asthma   . CAD (coronary artery disease)    CABG surgery in 06/2005 for left main disease; normal EF; negative stress nuclear 08/2008  . Cerebrovascular disease    1993-CVA; 08/2008-mild atherosclerosis without focal stenosis  . Chronic  back pain greater than 3 months duration    "nerve damage down into left leg" (02/09/2013)  . Chronic bronchitis ([place])    "haven't had it in last 2-3 years; used to get it q yr" (02/09/2013)  . COPD (chronic obstructive pulmonary disease) ([place])   . Dementia    "more than mild; having more short term memory loss recently; on Aricept" (02/09/2013)  . Diverticulosis   . Dizziness    chronic  . Family history of anesthesia complication    "makes daughter sick" (02/09/2013)  . Frequent PVCs   . Frequent UTI   . GERD (gastroesophageal reflux disease)   . PFXTKWIO(973.5)    "sometimes weekly" (02/09/2013)  . High cholesterol   . Hypertension   . Mental disorder    mild dementia  . Orthostatic hypotension   . Osteopenia   . Pneumonia    "couple times; several years ago" (02/09/2013)  . Sinus bradycardia    s/p STJ Assurity dual chamber pacemaker by Dr Rayann Heman 02/2013  . Stroke ([place])    20-30 yrs ago  . Syncope     Negative even recorder dated 3/08  . Tobacco abuse    60 pack years; continuing    Past Surgical History:  Procedure Laterality Date  . ABDOMINAL HYSTERECTOMY  1990  . APPENDECTOMY    . CARDIAC CATHETERIZATION  2007  .  CATARACT EXTRACTION, BILATERAL    . COLONOSCOPY  10/10/2011   Procedure: COLONOSCOPY;  Surgeon: Rogene Houston, MD;  [facility];  Service: Endoscopy;  Laterality: N/A;  1230  . COLONOSCOPY N/A 04/12/2015   Procedure: COLONOSCOPY;  Surgeon: Rogene Houston, MD;  [facility];  Service: Endoscopy;  Laterality: N/A;  . CORONARY ARTERY BYPASS GRAFT  2007   Left main disease  . ESOPHAGOGASTRODUODENOSCOPY  10/10/2011   Procedure: ESOPHAGOGASTRODUODENOSCOPY (EGD);  Surgeon: Rogene Houston, MD;  [facility];  Service: Endoscopy;  Laterality: N/A;  . ESOPHAGOGASTRODUODENOSCOPY N/A 04/08/2015   Procedure: ESOPHAGOGASTRODUODENOSCOPY (EGD);  Surgeon: Rogene Houston, MD;  [facility];  Service: Endoscopy;  Laterality: N/A;  .  HERNIA REPAIR    . PACEMAKER INSERTION  02/09/2013   STJ Assurity pacemaker implanted by Dr Rayann Heman for symptomatic bradycardia  . PERMANENT PACEMAKER INSERTION N/A 02/09/2013   Procedure: PERMANENT PACEMAKER INSERTION;  Surgeon: Coralyn Mark, MD;  Location: [facility];  Service: Cardiovascular;  Laterality: N/A;  . TONSILLECTOMY    . VENTRAL HERNIA REPAIR      Allergies  Allergen Reactions  . Ativan [Lorazepam]     Aggressive, hallucination  . Cephalexin Hives  . Contrast Media [Iodinated Diagnostic Agents]     Unknown  . Haldol [Haloperidol Lactate]     Aggressive, hallucination  . Iohexol     Urticaria, emesis, numbness during IVP performed at [facility] years ago  . Penicillins Hives    Has patient had a PCN reaction causing immediate rash, facial/tongue/throat swelling, SOB or lightheadedness with hypotension: Yes Has patient had a PCN reaction causing severe rash involving mucus membranes or skin necrosis: No Has patient had a PCN reaction that required hospitalization NoNo Has patient had a PCN reaction occurring within the last 10 years: No If all of the above answers are "NO", then may proceed with Cephalosporin use.   . Shellfish Allergy Other (See Comments)    Unknown  . Sulfa Antibiotics Hives    Current Outpatient Medications on File Prior to Visit  Medication Sig Dispense Refill  . acetaminophen (TYLENOL) 325 MG tablet Take 650 mg by mouth every 6 (six) hours as needed for mild pain. For pain    . busPIRone (BUSPAR) 5 MG tablet     . Calcium-Vitamin D (CALTRATE 600 PLUS-VIT D PO) Take 1 tablet by mouth at bedtime.     . Cyanocobalamin (VITAMIN B-12 CR PO) Take 1 tablet by mouth 2 (two) times daily.    Marland Kitchen donepezil (ARICEPT) 10 MG tablet Take 10 mg by mouth daily.     . ferrous sulfate 325 (65 FE) MG tablet Take 1 tablet (325 mg total) by mouth 2 (two) times daily with a meal. 60 tablet 3  . Multiple Vitamins-Minerals (PRESERVISION AREDS PO) Take 1 capsule  by mouth 2 (two) times daily.    . Omega-3 Fatty Acids (FISH OIL) 1200 MG CAPS Take 1,200 mg by mouth at bedtime.     Marland Kitchen omeprazole (PRILOSEC) 20 MG capsule Take 2 capsules (40 mg total) by mouth daily. (Patient taking differently: Take 20 mg by mouth 2 (two) times daily before a meal. ) 90 capsule 3  . simethicone (MYLICON) 80 MG chewable tablet Chew 1 tablet (80 mg total) by mouth every 6 (six) hours as needed for flatulence. 30 tablet 0   No current facility-administered medications on file prior to visit.  Objective:   Physical Exam Blood pressure (!) 160/82, pulse 72, temperature (!) 97.5 F (36.4 C), height 5\' 4"  (1.626 m), weight 125 lb (56.7 kg). Alert and oriented. Skin warm and dry. Oral mucosa is moist.   . Sclera anicteric, conjunctivae is pink. Thyroid not enlarged. No cervical lymphadenopathy. Lungs clear. Heart regular rate and rhythm.  Abdomen is soft. Bowel sounds are positive. No hepatomegaly. No abdominal masses felt. No tenderness.  No edema to lower extremities.          Assessment & Plan:  Hx of GIB. Hemoglobin appears stable. ? Lower GI bleed from diverticulosis. SHe seems to be doing well.  Will see back in 1 year.

## 2017-03-10 NOTE — Patient Instructions (Signed)
OV in 1 year.  

## 2017-03-11 DIAGNOSIS — I4891 Unspecified atrial fibrillation: Secondary | ICD-10-CM | POA: Diagnosis not present

## 2017-03-11 DIAGNOSIS — M4180 Other forms of scoliosis, site unspecified: Secondary | ICD-10-CM | POA: Diagnosis not present

## 2017-03-11 DIAGNOSIS — M1991 Primary osteoarthritis, unspecified site: Secondary | ICD-10-CM | POA: Diagnosis not present

## 2017-03-11 DIAGNOSIS — I2510 Atherosclerotic heart disease of native coronary artery without angina pectoris: Secondary | ICD-10-CM | POA: Diagnosis not present

## 2017-03-11 DIAGNOSIS — M5136 Other intervertebral disc degeneration, lumbar region: Secondary | ICD-10-CM | POA: Diagnosis not present

## 2017-03-11 DIAGNOSIS — I10 Essential (primary) hypertension: Secondary | ICD-10-CM | POA: Diagnosis not present

## 2017-03-11 DIAGNOSIS — Z950 Presence of cardiac pacemaker: Secondary | ICD-10-CM | POA: Diagnosis not present

## 2017-03-11 DIAGNOSIS — Z9181 History of falling: Secondary | ICD-10-CM | POA: Diagnosis not present

## 2017-03-11 DIAGNOSIS — H3530 Unspecified macular degeneration: Secondary | ICD-10-CM | POA: Diagnosis not present

## 2017-03-12 DIAGNOSIS — I4891 Unspecified atrial fibrillation: Secondary | ICD-10-CM | POA: Diagnosis not present

## 2017-03-12 DIAGNOSIS — M5136 Other intervertebral disc degeneration, lumbar region: Secondary | ICD-10-CM | POA: Diagnosis not present

## 2017-03-12 DIAGNOSIS — I10 Essential (primary) hypertension: Secondary | ICD-10-CM | POA: Diagnosis not present

## 2017-03-12 DIAGNOSIS — I2510 Atherosclerotic heart disease of native coronary artery without angina pectoris: Secondary | ICD-10-CM | POA: Diagnosis not present

## 2017-03-12 DIAGNOSIS — M1991 Primary osteoarthritis, unspecified site: Secondary | ICD-10-CM | POA: Diagnosis not present

## 2017-03-12 DIAGNOSIS — Z9181 History of falling: Secondary | ICD-10-CM | POA: Diagnosis not present

## 2017-03-12 DIAGNOSIS — M4180 Other forms of scoliosis, site unspecified: Secondary | ICD-10-CM | POA: Diagnosis not present

## 2017-03-12 DIAGNOSIS — Z950 Presence of cardiac pacemaker: Secondary | ICD-10-CM | POA: Diagnosis not present

## 2017-03-12 DIAGNOSIS — H3530 Unspecified macular degeneration: Secondary | ICD-10-CM | POA: Diagnosis not present

## 2017-03-14 DIAGNOSIS — Z9181 History of falling: Secondary | ICD-10-CM | POA: Diagnosis not present

## 2017-03-14 DIAGNOSIS — I2510 Atherosclerotic heart disease of native coronary artery without angina pectoris: Secondary | ICD-10-CM | POA: Diagnosis not present

## 2017-03-14 DIAGNOSIS — I10 Essential (primary) hypertension: Secondary | ICD-10-CM | POA: Diagnosis not present

## 2017-03-14 DIAGNOSIS — M4180 Other forms of scoliosis, site unspecified: Secondary | ICD-10-CM | POA: Diagnosis not present

## 2017-03-14 DIAGNOSIS — Z950 Presence of cardiac pacemaker: Secondary | ICD-10-CM | POA: Diagnosis not present

## 2017-03-14 DIAGNOSIS — M5136 Other intervertebral disc degeneration, lumbar region: Secondary | ICD-10-CM | POA: Diagnosis not present

## 2017-03-14 DIAGNOSIS — M1991 Primary osteoarthritis, unspecified site: Secondary | ICD-10-CM | POA: Diagnosis not present

## 2017-03-14 DIAGNOSIS — I4891 Unspecified atrial fibrillation: Secondary | ICD-10-CM | POA: Diagnosis not present

## 2017-03-14 DIAGNOSIS — H3530 Unspecified macular degeneration: Secondary | ICD-10-CM | POA: Diagnosis not present

## 2017-04-02 ENCOUNTER — Ambulatory Visit: Payer: Medicare Other | Admitting: Cardiovascular Disease

## 2017-04-02 ENCOUNTER — Encounter: Payer: Self-pay | Admitting: Cardiovascular Disease

## 2017-04-02 VITALS — BP 150/90 | HR 63 | Ht 64.0 in | Wt 123.0 lb

## 2017-04-02 DIAGNOSIS — Z8673 Personal history of transient ischemic attack (TIA), and cerebral infarction without residual deficits: Secondary | ICD-10-CM

## 2017-04-02 DIAGNOSIS — I25708 Atherosclerosis of coronary artery bypass graft(s), unspecified, with other forms of angina pectoris: Secondary | ICD-10-CM

## 2017-04-02 DIAGNOSIS — Z8719 Personal history of other diseases of the digestive system: Secondary | ICD-10-CM

## 2017-04-02 DIAGNOSIS — I10 Essential (primary) hypertension: Secondary | ICD-10-CM | POA: Diagnosis not present

## 2017-04-02 DIAGNOSIS — E7800 Pure hypercholesterolemia, unspecified: Secondary | ICD-10-CM | POA: Diagnosis not present

## 2017-04-02 DIAGNOSIS — R296 Repeated falls: Secondary | ICD-10-CM

## 2017-04-02 DIAGNOSIS — Z950 Presence of cardiac pacemaker: Secondary | ICD-10-CM | POA: Diagnosis not present

## 2017-04-02 DIAGNOSIS — M169 Osteoarthritis of hip, unspecified: Secondary | ICD-10-CM | POA: Diagnosis not present

## 2017-04-02 NOTE — Patient Instructions (Signed)
Medication Instructions:  Your physician recommends that you continue on your current medications as directed. Please refer to the Current Medication list given to you today.   Labwork: NONE   Testing/Procedures: NONE   Follow-Up: Your physician recommends that you schedule a follow-up appointment in November with Dr. Shawna Orleans.    Any Other Special Instructions Will Be Listed Below (If Applicable).     If you need a refill on your cardiac medications before your next appointment, please call your pharmacy.  Thank you for choosing [facility]!

## 2017-04-02 NOTE — Progress Notes (Signed)
SUBJECTIVE: The patient presents for routine follow up. She has a history of CAD and CABG as well as CVA. She has a history of chronic dizziness and chest pain. Her most recent nuclear stress test was on 10/4/17which showed no evidence of ischemia.  She had a pacemaker placed for tachycardia-bradycardia syndrome by Dr. Rayann Heman. She also has outflow tract PVCs for which she was started on metoprolol, but this was discontinued by her PCP as it didn't make her feel well.   She has a history of frequent falls as well.  Lipitor was previously stopped by her daughter due to weakness and balance issues.  Echo in 01/2013 demonstrated the following:  Mild LVH with LVEF 76-16%, grade 2 diastolic dysfunction. Moderate left atrial enlargement. MIldly thickened mtiral leaflets with mild, posteriorly directed mtiral regurgitation. Mildly sclerotic aortic valve with trivial aortic regurgitation. Mild RV dilatation. Mild to moderate tricuspid regurgitation with moderate pulmonary hypertension, PASP 52 mmHg.  She has a history of GI bleeding. She is not on aspirin for this reason. Colonoscopy on 04/12/15 demonstrated 2 colonic angiodysplastic lesions. She was found to have sigmoid diverticulosis which was felt to be the source of GI bleeding.  She was seen in the ED on 01/30/17 for generalized weakness.  I reviewed all relevant documentation, labs, and studies.  CT of the head and neck did not show any acute findings.  CBC showed leukocytosis, 18.6.  Hemoglobin was 16.  She is here with her daughter, [name], and her caretaker, Sonia Side.  The patient has had several falls but when she is in a wheelchair she has a safety belt.  She denies cardiac related chest pain.  She has occasional chest pains only after drinking carbonated beverages.  She now drinks mostly water.  I am told she has a good appetite.  She does have episodes where she gets combative if she gets upset.      Review of Systems: As per  "subjective", otherwise negative.  Allergies  Allergen Reactions  . Ativan [Lorazepam]     Aggressive, hallucination  . Cephalexin Hives  . Contrast Media [Iodinated Diagnostic Agents]     Unknown  . Haldol [Haloperidol Lactate]     Aggressive, hallucination  . Iohexol     Urticaria, emesis, numbness during IVP performed at [facility] years ago  . Penicillins Hives    Has patient had a PCN reaction causing immediate rash, facial/tongue/throat swelling, SOB or lightheadedness with hypotension: Yes Has patient had a PCN reaction causing severe rash involving mucus membranes or skin necrosis: No Has patient had a PCN reaction that required hospitalization NoNo Has patient had a PCN reaction occurring within the last 10 years: No If all of the above answers are "NO", then may proceed with Cephalosporin use.   . Shellfish Allergy Other (See Comments)    Unknown  . Sulfa Antibiotics Hives    Current Outpatient Medications  Medication Sig Dispense Refill  . acetaminophen (TYLENOL) 325 MG tablet Take 650 mg by mouth every 6 (six) hours as needed for mild pain. For pain    . busPIRone (BUSPAR) 5 MG tablet     . Calcium-Vitamin D (CALTRATE 600 PLUS-VIT D PO) Take 1 tablet by mouth at bedtime.     . Cyanocobalamin (VITAMIN B-12 CR PO) Take 1 tablet by mouth 2 (two) times daily.    Marland Kitchen donepezil (ARICEPT) 10 MG tablet Take 10 mg by mouth daily.     . ferrous sulfate 325 (  65 FE) MG tablet Take 1 tablet (325 mg total) by mouth 2 (two) times daily with a meal. 60 tablet 3  . Multiple Vitamins-Minerals (PRESERVISION AREDS PO) Take 1 capsule by mouth 2 (two) times daily.    . Omega-3 Fatty Acids (FISH OIL) 1200 MG CAPS Take 1,200 mg by mouth at bedtime.     Marland Kitchen omeprazole (PRILOSEC) 20 MG capsule Take 2 capsules (40 mg total) by mouth daily. (Patient taking differently: Take 20 mg by mouth 2 (two) times daily before a meal. ) 90 capsule 3  . simethicone (MYLICON) 80 MG chewable tablet Chew 1  tablet (80 mg total) by mouth every 6 (six) hours as needed for flatulence. 30 tablet 0   No current facility-administered medications for this visit.     Past Medical History:  Diagnosis Date  . Anemia   . Arthritis    "fingers; back" (02/09/2013)  . Asthma   . CAD (coronary artery disease)    CABG surgery in 06/2005 for left main disease; normal EF; negative stress nuclear 08/2008  . Cerebrovascular disease    1993-CVA; 08/2008-mild atherosclerosis without focal stenosis  . Chronic back pain greater than 3 months duration    "nerve damage down into left leg" (02/09/2013)  . Chronic bronchitis ([place])    "haven't had it in last 2-3 years; used to get it q yr" (02/09/2013)  . COPD (chronic obstructive pulmonary disease) ([place])   . Dementia    "more than mild; having more short term memory loss recently; on Aricept" (02/09/2013)  . Diverticulosis   . Dizziness    chronic  . Family history of anesthesia complication    "makes daughter sick" (02/09/2013)  . Frequent PVCs   . Frequent UTI   . GERD (gastroesophageal reflux disease)   . JKDTOIZT(245.8)    "sometimes weekly" (02/09/2013)  . High cholesterol   . Hypertension   . Mental disorder    mild dementia  . Orthostatic hypotension   . Osteopenia   . Pneumonia    "couple times; several years ago" (02/09/2013)  . Sinus bradycardia    s/p STJ Assurity dual chamber pacemaker by Dr Rayann Heman 02/2013  . Stroke ([place])    20-30 yrs ago  . Syncope     Negative even recorder dated 3/08  . Tobacco abuse    60 pack years; continuing    Past Surgical History:  Procedure Laterality Date  . ABDOMINAL HYSTERECTOMY  1990  . APPENDECTOMY    . CARDIAC CATHETERIZATION  2007  . CATARACT EXTRACTION, BILATERAL    . COLONOSCOPY  10/10/2011   Procedure: COLONOSCOPY;  Surgeon: Rogene Houston, MD;  [facility];  Service: Endoscopy;  Laterality: N/A;  1230  . COLONOSCOPY N/A 04/12/2015   Procedure: COLONOSCOPY;  Surgeon: Rogene Houston, MD;   [facility];  Service: Endoscopy;  Laterality: N/A;  . CORONARY ARTERY BYPASS GRAFT  2007   Left main disease  . ESOPHAGOGASTRODUODENOSCOPY  10/10/2011   Procedure: ESOPHAGOGASTRODUODENOSCOPY (EGD);  Surgeon: Rogene Houston, MD;  [facility];  Service: Endoscopy;  Laterality: N/A;  . ESOPHAGOGASTRODUODENOSCOPY N/A 04/08/2015   Procedure: ESOPHAGOGASTRODUODENOSCOPY (EGD);  Surgeon: Rogene Houston, MD;  [facility];  Service: Endoscopy;  Laterality: N/A;  . HERNIA REPAIR    . PACEMAKER INSERTION  02/09/2013   STJ Assurity pacemaker implanted by Dr Rayann Heman for symptomatic bradycardia  . PERMANENT PACEMAKER INSERTION N/A 02/09/2013   Procedure: PERMANENT PACEMAKER INSERTION;  Surgeon:  Coralyn Mark, MD;  Location: [facility];  Service: Cardiovascular;  Laterality: N/A;  . TONSILLECTOMY    . VENTRAL HERNIA REPAIR      Social History   Socioeconomic History  . Marital status: Married    Spouse name: Percell Miller   . Number of children: Not on file  . Years of education: 49  . Highest education level: Not on file  Occupational History  . Occupation: retired  Scientific laboratory technician  . Financial resource strain: Not on file  . Food insecurity:    Worry: Not on file    Inability: Not on file  . Transportation needs:    Medical: Not on file    Non-medical: Not on file  Tobacco Use  . Smoking status: Former Smoker    Packs/day: 0.50    Years: 60.00    Pack years: 30.00    Types: Cigarettes    Start date: 12/12/1952    Last attempt to quit: 10/20/2006    Years since quitting: 10.4  . Smokeless tobacco: Never Used  . Tobacco comment: 02/09/2013 "quit smoking in ~ 2009"  Substance and Sexual Activity  . Alcohol use: No    Alcohol/week: 0.0 oz  . Drug use: No  . Sexual activity: Never  Lifestyle  . Physical activity:    Days per week: Not on file    Minutes per session: Not on file  . Stress: Not on file  Relationships  . Social connections:    Talks on phone:  Not on file    Gets together: Not on file    Attends religious service: Not on file    Active member of club or organization: Not on file    Attends meetings of clubs or organizations: Not on file    Relationship status: Not on file  . Intimate partner violence:    Fear of current or ex partner: Not on file    Emotionally abused: Not on file    Physically abused: Not on file    Forced sexual activity: Not on file  Other Topics Concern  . Not on file  Social History Narrative   3 children    Right handed   HS    Drinks decaff     Vitals:   04/02/17 1355  BP: (!) 150/90  Pulse: 63  SpO2: 96%  Weight: 123 lb (55.8 kg)  Height: 5\' 4"  (1.626 m)    Wt Readings from Last 3 Encounters:  04/02/17 123 lb (55.8 kg)  03/10/17 125 lb (56.7 kg)  01/30/17 124 lb (56.2 kg)     PHYSICAL EXAM General: NAD, elderly, chronically ill-appearing HEENT: Normal. Neck: No JVD, no thyromegaly. Lungs: Clear to auscultation bilaterally with normal respiratory effort. CV: Regular rate and rhythm, normal S1/S2, no S3/S4, no murmur. No pretibial or periankle edema.    Abdomen: Soft, nontender, no distention.  Neurologic: Alert and oriented.  Psych: Normal affect. Skin: Normal. Musculoskeletal: No gross deformities.    ECG: Most recent ECG reviewed.   Labs: Lab Results  Component Value Date/Time   K 3.8 01/30/2017 09:07 PM   BUN 16 01/30/2017 09:07 PM   CREATININE 0.80 01/30/2017 09:07 PM   CREATININE 0.71 01/18/2013 03:42 PM   ALT 15 01/30/2017 09:07 PM   TSH 3.297 04/11/2015 04:16 PM   TSH 2.680 09/20/2013 10:42 AM   HGB 16.0 (H) 01/30/2017 09:07 PM     Lipids: Lab Results  Component Value Date/Time   LDLCALC 50 09/20/2013 10:42 AM  CHOL 144 09/20/2013 10:42 AM   TRIG 50 09/20/2013 10:42 AM   HDL 84 09/20/2013 10:42 AM       ASSESSMENT AND PLAN:  1. Sick sinus syndrome s/p pacemaker: Normal device function. Follows with Dr. Lovena Le.   2. CAD s/p CABG:  Symptomatically stable. Normal stress test in 10/2015. Not on ASA due to h/o GI bleeding. Not on statin due to weakness and falls (stopped by daughter). I am not inclined to restart statin therapy.  3. Essential HTN: Blood pressure is elevated.  No changes today.  4. Hyperlipidemia: On fish oil. Not on statin due to weakness and falls (stopped by daughter). I am not inclined to restart statin therapy.  5. PVC's: Unable to tolerate metoprolol.   6. H/o CVA with frequent falls and generalized weakness: She is not on aspirin due to a history of GI bleeding. She is no longer on statin therapy.     Disposition: Follow up late November 2019   Kate Sable, M.D., F.A.C.C.

## 2017-04-09 ENCOUNTER — Other Ambulatory Visit: Payer: Self-pay | Admitting: Cardiology

## 2017-05-03 DIAGNOSIS — M169 Osteoarthritis of hip, unspecified: Secondary | ICD-10-CM | POA: Diagnosis not present

## 2017-05-05 ENCOUNTER — Ambulatory Visit (INDEPENDENT_AMBULATORY_CARE_PROVIDER_SITE_OTHER): Payer: Medicare Other | Admitting: *Deleted

## 2017-05-05 DIAGNOSIS — I495 Sick sinus syndrome: Secondary | ICD-10-CM

## 2017-05-05 NOTE — Progress Notes (Signed)
Remote pacemaker transmission.   

## 2017-05-06 ENCOUNTER — Encounter: Payer: Self-pay | Admitting: Cardiology

## 2017-05-23 LAB — CUP PACEART REMOTE DEVICE CHECK
Battery Remaining Percentage: 95.5 %
Battery Voltage: 2.99 V
Brady Statistic AS VS Percent: 52 %
Brady Statistic RV Percent Paced: 1 %
Date Time Interrogation Session: 20190429060014
Implantable Lead Implant Date: 20150203
Implantable Lead Location: 753859
Implantable Lead Model: 1948
Implantable Pulse Generator Implant Date: 20150203
Lead Channel Pacing Threshold Amplitude: 0.75 V
Lead Channel Sensing Intrinsic Amplitude: 2.9 mV
Lead Channel Setting Pacing Amplitude: 2 V
Lead Channel Setting Sensing Sensitivity: 2 mV
MDC IDC LEAD IMPLANT DT: 20150203
MDC IDC LEAD LOCATION: 753860
MDC IDC MSMT BATTERY REMAINING LONGEVITY: 124 mo
MDC IDC MSMT LEADCHNL RA IMPEDANCE VALUE: 490 Ohm
MDC IDC MSMT LEADCHNL RA PACING THRESHOLD PULSEWIDTH: 0.4 ms
MDC IDC MSMT LEADCHNL RV IMPEDANCE VALUE: 600 Ohm
MDC IDC MSMT LEADCHNL RV PACING THRESHOLD AMPLITUDE: 1 V
MDC IDC MSMT LEADCHNL RV PACING THRESHOLD PULSEWIDTH: 0.4 ms
MDC IDC MSMT LEADCHNL RV SENSING INTR AMPL: 12 mV
MDC IDC SET LEADCHNL RV PACING AMPLITUDE: 2.5 V
MDC IDC SET LEADCHNL RV PACING PULSEWIDTH: 0.4 ms
MDC IDC STAT BRADY AP VP PERCENT: 1 %
MDC IDC STAT BRADY AP VS PERCENT: 43 %
MDC IDC STAT BRADY AS VP PERCENT: 1 %
MDC IDC STAT BRADY RA PERCENT PACED: 33 %
Pulse Gen Model: 2240
Pulse Gen Serial Number: 7586568

## 2017-05-26 ENCOUNTER — Encounter: Payer: Self-pay | Admitting: *Deleted

## 2017-05-27 ENCOUNTER — Encounter: Payer: Self-pay | Admitting: Internal Medicine

## 2017-05-27 ENCOUNTER — Ambulatory Visit: Payer: Medicare Other | Admitting: Internal Medicine

## 2017-05-27 VITALS — BP 120/80 | HR 65 | Ht 64.0 in | Wt 119.0 lb

## 2017-05-27 DIAGNOSIS — I495 Sick sinus syndrome: Secondary | ICD-10-CM

## 2017-05-27 DIAGNOSIS — I25708 Atherosclerosis of coronary artery bypass graft(s), unspecified, with other forms of angina pectoris: Secondary | ICD-10-CM | POA: Diagnosis not present

## 2017-05-27 NOTE — Patient Instructions (Signed)
Medication Instructions:  Your physician recommends that you continue on your current medications as directed. Please refer to the Current Medication list given to you today.   Labwork: NONE   Testing/Procedures: NONE   Follow-Up: Your physician wants you to follow-up in: 1 year. You will receive a reminder letter in the mail two months in advance. If you don't receive a letter, please call our office to schedule the follow-up appointment.   Any Other Special Instructions Will Be Listed Below (If Applicable).     If you need a refill on your cardiac medications before your next appointment, please call your pharmacy.  Thank you for choosing [facility]!   

## 2017-05-27 NOTE — Progress Notes (Addendum)
HPI [name] is returns today for ongoing evaluation of her PPM. She is a pleasant 84 yo woman with symptomatic sinus node dysfunction, s/p PPM insertion, along with CAD, s/p CABG.  Her daughter tells me that her memory is getting worse due to dementia.   No frank syncope.  She has musculoskeletal chest pain in her chest which is unchanged. She is sleeping more. No significan agitation.   Allergies  Allergen Reactions  . Ativan [Lorazepam]     Aggressive, hallucination  . Cephalexin Hives  . Contrast Media [Iodinated Diagnostic Agents]     Unknown  . Haldol [Haloperidol Lactate]     Aggressive, hallucination  . Iohexol     Urticaria, emesis, numbness during IVP performed at [facility] years ago  . Penicillins Hives    Has patient had a PCN reaction causing immediate rash, facial/tongue/throat swelling, SOB or lightheadedness with hypotension: Yes Has patient had a PCN reaction causing severe rash involving mucus membranes or skin necrosis: No Has patient had a PCN reaction that required hospitalization NoNo Has patient had a PCN reaction occurring within the last 10 years: No If all of the above answers are "NO", then may proceed with Cephalosporin use.   . Shellfish Allergy Other (See Comments)    Unknown  . Sulfa Antibiotics Hives     Current Outpatient Medications  Medication Sig Dispense Refill  . acetaminophen (TYLENOL) 325 MG tablet Take 650 mg by mouth every 6 (six) hours as needed for mild pain. For pain    . busPIRone (BUSPAR) 5 MG tablet     . Calcium-Vitamin D (CALTRATE 600 PLUS-VIT D PO) Take 1 tablet by mouth at bedtime.     . Cyanocobalamin (VITAMIN B-12 CR PO) Take 1 tablet by mouth 2 (two) times daily.    Marland Kitchen donepezil (ARICEPT) 10 MG tablet Take 10 mg by mouth daily.     . ferrous sulfate 325 (65 FE) MG tablet Take 1 tablet (325 mg total) by mouth 2 (two) times daily with a meal. 60 tablet 3  . Multiple Vitamins-Minerals (PRESERVISION AREDS PO)  Take 1 capsule by mouth 2 (two) times daily.    . nitroGLYCERIN (NITROSTAT) 0.4 MG SL tablet PLACE ONE TABLET UNDER THE TONGUE EVERY 5 MINUTES AS NEEDED FOR CHESTPAIN DO NOT EXCEED 3 IN 24 HOURS 25 tablet 3  . Omega-3 Fatty Acids (FISH OIL) 1200 MG CAPS Take 1,200 mg by mouth at bedtime.     Marland Kitchen omeprazole (PRILOSEC) 20 MG capsule Take 2 capsules (40 mg total) by mouth daily. (Patient taking differently: Take 20 mg by mouth 2 (two) times daily before a meal. ) 90 capsule 3  . simethicone (MYLICON) 80 MG chewable tablet Chew 1 tablet (80 mg total) by mouth every 6 (six) hours as needed for flatulence. 30 tablet 0   No current facility-administered medications for this visit.      Past Medical History:  Diagnosis Date  . Anemia   . Arthritis    "fingers; back" (02/09/2013)  . Asthma   . CAD (coronary artery disease)    CABG surgery in 06/2005 for left main disease; normal EF; negative stress nuclear 08/2008  . Cerebrovascular disease    1993-CVA; 08/2008-mild atherosclerosis without focal stenosis  . Chronic back pain greater than 3 months duration    "nerve damage down into left leg" (02/09/2013)  . Chronic bronchitis ([place])    "haven't had it in last 2-3 years; used to get  it q yr" (02/09/2013)  . COPD (chronic obstructive pulmonary disease) ([place])   . Dementia    "more than mild; having more short term memory loss recently; on Aricept" (02/09/2013)  . Diverticulosis   . Dizziness    chronic  . Family history of anesthesia complication    "makes daughter sick" (02/09/2013)  . Frequent PVCs   . Frequent UTI   . GERD (gastroesophageal reflux disease)   . KCLEXNTZ(001.7)    "sometimes weekly" (02/09/2013)  . High cholesterol   . Hypertension   . Mental disorder    mild dementia  . Orthostatic hypotension   . Osteopenia   . Pneumonia    "couple times; several years ago" (02/09/2013)  . Sinus bradycardia    s/p STJ Assurity dual chamber pacemaker by Dr Rayann Heman 02/2013  . Stroke ([place])    20-30 yrs  ago  . Syncope     Negative even recorder dated 3/08  . Tobacco abuse    60 pack years; continuing    ROS:   All systems reviewed and negative except as noted in the HPI.   Past Surgical History:  Procedure Laterality Date  . ABDOMINAL HYSTERECTOMY  1990  . APPENDECTOMY    . CARDIAC CATHETERIZATION  2007  . CATARACT EXTRACTION, BILATERAL    . COLONOSCOPY  10/10/2011   Procedure: COLONOSCOPY;  Surgeon: Rogene Houston, MD;  [facility];  Service: Endoscopy;  Laterality: N/A;  1230  . COLONOSCOPY N/A 04/12/2015   Procedure: COLONOSCOPY;  Surgeon: Rogene Houston, MD;  [facility];  Service: Endoscopy;  Laterality: N/A;  . CORONARY ARTERY BYPASS GRAFT  2007   Left main disease  . ESOPHAGOGASTRODUODENOSCOPY  10/10/2011   Procedure: ESOPHAGOGASTRODUODENOSCOPY (EGD);  Surgeon: Rogene Houston, MD;  [facility];  Service: Endoscopy;  Laterality: N/A;  . ESOPHAGOGASTRODUODENOSCOPY N/A 04/08/2015   Procedure: ESOPHAGOGASTRODUODENOSCOPY (EGD);  Surgeon: Rogene Houston, MD;  [facility];  Service: Endoscopy;  Laterality: N/A;  . HERNIA REPAIR    . PACEMAKER INSERTION  02/09/2013   STJ Assurity pacemaker implanted by Dr Rayann Heman for symptomatic bradycardia  . PERMANENT PACEMAKER INSERTION N/A 02/09/2013   Procedure: PERMANENT PACEMAKER INSERTION;  Surgeon: Coralyn Mark, MD;  Location: [place] CATH LAB;  Service: Cardiovascular;  Laterality: N/A;  . TONSILLECTOMY    . VENTRAL HERNIA REPAIR       Family History  Problem Relation Age of Onset  . Angina Mother   . Heart attack Mother   . Heart disease Unknown   . Arthritis Unknown   . Lung disease Unknown   . Asthma Unknown      Social History   Socioeconomic History  . Marital status: Married    Spouse name: Percell Miller   . Number of children: Not on file  . Years of education: 26  . Highest education level: Not on file  Occupational History  . Occupation: retired  Scientific laboratory technician  . Financial  resource strain: Not on file  . Food insecurity:    Worry: Not on file    Inability: Not on file  . Transportation needs:    Medical: Not on file    Non-medical: Not on file  Tobacco Use  . Smoking status: Former Smoker    Packs/day: 0.50    Years: 60.00    Pack years: 30.00    Types: Cigarettes    Start date: 12/12/1952    Last attempt to quit: 10/20/2006  Years since quitting: 10.6  . Smokeless tobacco: Never Used  . Tobacco comment: 02/09/2013 "quit smoking in ~ 2009"  Substance and Sexual Activity  . Alcohol use: No    Alcohol/week: 0.0 oz  . Drug use: No  . Sexual activity: Never  Lifestyle  . Physical activity:    Days per week: Not on file    Minutes per session: Not on file  . Stress: Not on file  Relationships  . Social connections:    Talks on phone: Not on file    Gets together: Not on file    Attends religious service: Not on file    Active member of club or organization: Not on file    Attends meetings of clubs or organizations: Not on file    Relationship status: Not on file  . Intimate partner violence:    Fear of current or ex partner: Not on file    Emotionally abused: Not on file    Physically abused: Not on file    Forced sexual activity: Not on file  Other Topics Concern  . Not on file  Social History Narrative   3 children    Right handed   HS    Drinks decaff     BP 120/80 (BP Location: Right Arm)   Pulse 65   Ht 5\' 4"  (1.626 m)   Wt 119 lb (54 kg)   SpO2 99%   BMI 20.43 kg/m   Physical Exam:  elderly appearing woman, NAD HEENT: Unremarkable Neck:  6 cm JVD, no thyromegally Lymphatics:  No adenopathy Back:  No CVA tenderness Lungs:  Clear with no wheezes HEART:  Regular rate rhythm, no murmurs, no rubs, no clicks Abd:  soft, positive bowel sounds, no organomegally, no rebound, no guarding Ext:  2 plus pulses, no edema, no cyanosis, no clubbing Skin:  No rashes no nodules Neuro:  CN II through XII intact, motor grossly  intact  DEVICE  Normal device function.  See PaceArt for details.   Assess/Plan: 1. PAF - she is maintaining NSR over 99% of the time. She is not a candidate for systemic anti-coagulation.  2. CAD - she is s/p CABG and still has some MS chest pain.  3. PPM - her St. Jude device is working normally. Will follow.  Mikle Bosworth.D.

## 2017-06-02 DIAGNOSIS — M169 Osteoarthritis of hip, unspecified: Secondary | ICD-10-CM | POA: Diagnosis not present

## 2017-06-13 LAB — CUP PACEART INCLINIC DEVICE CHECK
Implantable Lead Location: 753859
Lead Channel Setting Pacing Amplitude: 2 V
Lead Channel Setting Pacing Pulse Width: 0.4 ms
MDC IDC LEAD IMPLANT DT: 20150203
MDC IDC LEAD IMPLANT DT: 20150203
MDC IDC LEAD LOCATION: 753860
MDC IDC PG IMPLANT DT: 20150203
MDC IDC PG SERIAL: 7586568
MDC IDC SESS DTM: 20190607172238
MDC IDC SET LEADCHNL RV PACING AMPLITUDE: 2.5 V
MDC IDC SET LEADCHNL RV SENSING SENSITIVITY: 2 mV
Pulse Gen Model: 2240

## 2017-06-17 DIAGNOSIS — R8290 Unspecified abnormal findings in urine: Secondary | ICD-10-CM | POA: Diagnosis not present

## 2017-06-17 DIAGNOSIS — Z1389 Encounter for screening for other disorder: Secondary | ICD-10-CM | POA: Diagnosis not present

## 2017-06-17 DIAGNOSIS — R7309 Other abnormal glucose: Secondary | ICD-10-CM | POA: Diagnosis not present

## 2017-06-17 DIAGNOSIS — I2510 Atherosclerotic heart disease of native coronary artery without angina pectoris: Secondary | ICD-10-CM | POA: Diagnosis not present

## 2017-06-17 DIAGNOSIS — Z6820 Body mass index (BMI) 20.0-20.9, adult: Secondary | ICD-10-CM | POA: Diagnosis not present

## 2017-06-17 DIAGNOSIS — R001 Bradycardia, unspecified: Secondary | ICD-10-CM | POA: Diagnosis not present

## 2017-06-17 DIAGNOSIS — Z0001 Encounter for general adult medical examination with abnormal findings: Secondary | ICD-10-CM | POA: Diagnosis not present

## 2017-07-03 DIAGNOSIS — M169 Osteoarthritis of hip, unspecified: Secondary | ICD-10-CM | POA: Diagnosis not present

## 2017-07-29 DIAGNOSIS — H353222 Exudative age-related macular degeneration, left eye, with inactive choroidal neovascularization: Secondary | ICD-10-CM | POA: Diagnosis not present

## 2017-07-29 DIAGNOSIS — H353132 Nonexudative age-related macular degeneration, bilateral, intermediate dry stage: Secondary | ICD-10-CM | POA: Diagnosis not present

## 2017-07-29 DIAGNOSIS — H43813 Vitreous degeneration, bilateral: Secondary | ICD-10-CM | POA: Diagnosis not present

## 2017-07-29 DIAGNOSIS — H35371 Puckering of macula, right eye: Secondary | ICD-10-CM | POA: Diagnosis not present

## 2017-08-02 DIAGNOSIS — M169 Osteoarthritis of hip, unspecified: Secondary | ICD-10-CM | POA: Diagnosis not present

## 2017-08-04 ENCOUNTER — Ambulatory Visit (INDEPENDENT_AMBULATORY_CARE_PROVIDER_SITE_OTHER): Payer: Medicare Other | Admitting: *Deleted

## 2017-08-04 DIAGNOSIS — I495 Sick sinus syndrome: Secondary | ICD-10-CM | POA: Diagnosis not present

## 2017-08-04 NOTE — Progress Notes (Signed)
Remote pacemaker transmission.   

## 2017-08-05 ENCOUNTER — Encounter: Payer: Self-pay | Admitting: Cardiology

## 2017-09-02 DIAGNOSIS — M169 Osteoarthritis of hip, unspecified: Secondary | ICD-10-CM | POA: Diagnosis not present

## 2017-09-06 LAB — CUP PACEART REMOTE DEVICE CHECK
Battery Remaining Longevity: 125 mo
Battery Remaining Percentage: 95.5 %
Battery Voltage: 2.99 V
Brady Statistic AP VS Percent: 44 %
Brady Statistic RA Percent Paced: 32 %
Date Time Interrogation Session: 20190729060013
Implantable Lead Implant Date: 20150203
Implantable Lead Location: 753859
Implantable Lead Location: 753860
Implantable Lead Model: 1948
Implantable Pulse Generator Implant Date: 20150203
Lead Channel Impedance Value: 490 Ohm
Lead Channel Pacing Threshold Amplitude: 1 V
Lead Channel Pacing Threshold Pulse Width: 0.4 ms
Lead Channel Sensing Intrinsic Amplitude: 12 mV
Lead Channel Sensing Intrinsic Amplitude: 3.1 mV
Lead Channel Setting Pacing Amplitude: 2.5 V
Lead Channel Setting Pacing Pulse Width: 0.4 ms
MDC IDC LEAD IMPLANT DT: 20150203
MDC IDC MSMT LEADCHNL RA PACING THRESHOLD AMPLITUDE: 0.75 V
MDC IDC MSMT LEADCHNL RV IMPEDANCE VALUE: 590 Ohm
MDC IDC MSMT LEADCHNL RV PACING THRESHOLD PULSEWIDTH: 0.4 ms
MDC IDC SET LEADCHNL RA PACING AMPLITUDE: 2 V
MDC IDC SET LEADCHNL RV SENSING SENSITIVITY: 2 mV
MDC IDC STAT BRADY AP VP PERCENT: 1 %
MDC IDC STAT BRADY AS VP PERCENT: 1 %
MDC IDC STAT BRADY AS VS PERCENT: 48 %
MDC IDC STAT BRADY RV PERCENT PACED: 1 %
Pulse Gen Model: 2240
Pulse Gen Serial Number: 7586568

## 2017-10-21 DIAGNOSIS — N342 Other urethritis: Secondary | ICD-10-CM | POA: Diagnosis not present

## 2017-10-21 DIAGNOSIS — I10 Essential (primary) hypertension: Secondary | ICD-10-CM | POA: Diagnosis not present

## 2017-10-21 DIAGNOSIS — R8290 Unspecified abnormal findings in urine: Secondary | ICD-10-CM | POA: Diagnosis not present

## 2017-10-21 DIAGNOSIS — M542 Cervicalgia: Secondary | ICD-10-CM | POA: Diagnosis not present

## 2017-11-03 ENCOUNTER — Ambulatory Visit (INDEPENDENT_AMBULATORY_CARE_PROVIDER_SITE_OTHER): Payer: Medicare Other | Admitting: *Deleted

## 2017-11-03 DIAGNOSIS — I10 Essential (primary) hypertension: Secondary | ICD-10-CM

## 2017-11-03 DIAGNOSIS — I495 Sick sinus syndrome: Secondary | ICD-10-CM | POA: Diagnosis not present

## 2017-11-03 NOTE — Progress Notes (Signed)
Remote pacemaker transmission.   

## 2017-11-04 ENCOUNTER — Other Ambulatory Visit (HOSPITAL_COMMUNITY): Payer: Self-pay | Admitting: Family Medicine

## 2017-11-04 ENCOUNTER — Ambulatory Visit (HOSPITAL_COMMUNITY)
Admission: RE | Admit: 2017-11-04 | Discharge: 2017-11-04 | Disposition: A | Discharge status: Home / Self Care | Payer: Medicare Other | Source: Ambulatory Visit | Attending: Family Medicine | Admitting: Family Medicine

## 2017-11-04 DIAGNOSIS — M810 Age-related osteoporosis without current pathological fracture: Secondary | ICD-10-CM | POA: Diagnosis not present

## 2017-11-04 DIAGNOSIS — M25551 Pain in right hip: Secondary | ICD-10-CM | POA: Diagnosis not present

## 2017-11-04 DIAGNOSIS — N342 Other urethritis: Secondary | ICD-10-CM | POA: Diagnosis not present

## 2017-11-04 DIAGNOSIS — S79911A Unspecified injury of right hip, initial encounter: Secondary | ICD-10-CM | POA: Diagnosis not present

## 2017-11-04 DIAGNOSIS — M25552 Pain in left hip: Secondary | ICD-10-CM

## 2017-11-12 IMAGING — CR DG CHEST 1V PORT
1 series · 1 of 1 positions shown · non-contrast
Comparison: 10/14/2013

CLINICAL DATA: Chest pain last night with shortness of breath.
Prior smoker.

EXAM:
PORTABLE CHEST 1 VIEW

[ap portable]
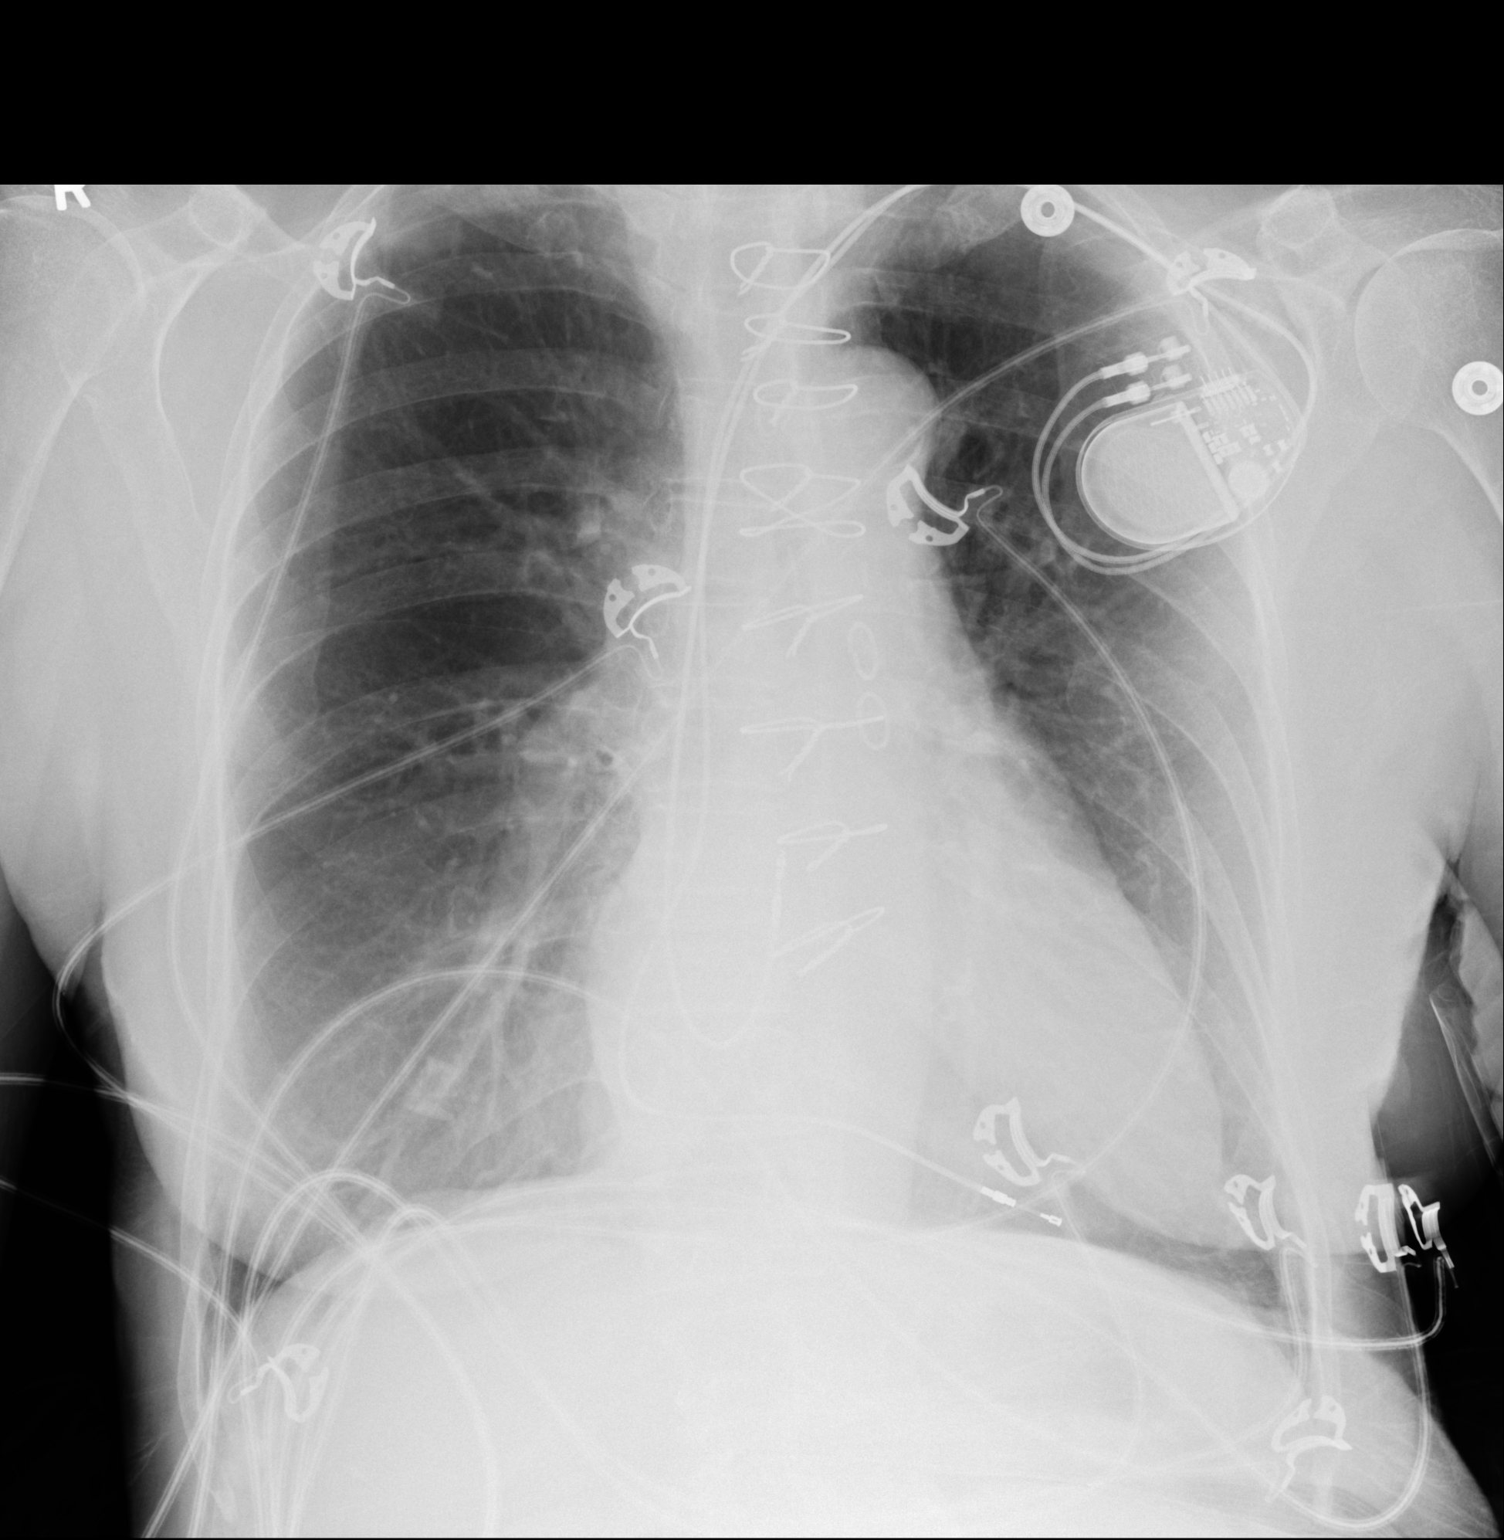

[1 of 1 positions shown; findings below may reference images not displayed]

FINDINGS: The lungs are hyperinflated likely secondary to COPD. There is no
focal parenchymal opacity. There is no pleural effusion or
pneumothorax. The heart size is mildly enlarged. There is evidence
of prior CABG. There is a dual lead AICD.

The osseous structures are unremarkable.
IMPRESSION: No active disease.

## 2017-11-14 ENCOUNTER — Emergency Department (HOSPITAL_COMMUNITY): Payer: Medicare Other

## 2017-11-14 ENCOUNTER — Emergency Department (HOSPITAL_COMMUNITY)
Admission: EM | Admit: 2017-11-14 | Discharge: 2017-11-14 | Disposition: A | Discharge status: Home / Self Care | Payer: Medicare Other | Attending: Emergency Medicine | Admitting: Emergency Medicine

## 2017-11-14 ENCOUNTER — Encounter (HOSPITAL_COMMUNITY): Payer: Self-pay

## 2017-11-14 DIAGNOSIS — I10 Essential (primary) hypertension: Secondary | ICD-10-CM | POA: Diagnosis not present

## 2017-11-14 DIAGNOSIS — I2510 Atherosclerotic heart disease of native coronary artery without angina pectoris: Secondary | ICD-10-CM | POA: Diagnosis not present

## 2017-11-14 DIAGNOSIS — R404 Transient alteration of awareness: Secondary | ICD-10-CM | POA: Diagnosis not present

## 2017-11-14 DIAGNOSIS — Z87891 Personal history of nicotine dependence: Secondary | ICD-10-CM | POA: Insufficient documentation

## 2017-11-14 DIAGNOSIS — Z79899 Other long term (current) drug therapy: Secondary | ICD-10-CM | POA: Insufficient documentation

## 2017-11-14 DIAGNOSIS — J449 Chronic obstructive pulmonary disease, unspecified: Secondary | ICD-10-CM | POA: Diagnosis not present

## 2017-11-14 DIAGNOSIS — J45909 Unspecified asthma, uncomplicated: Secondary | ICD-10-CM | POA: Insufficient documentation

## 2017-11-14 DIAGNOSIS — R609 Edema, unspecified: Secondary | ICD-10-CM | POA: Diagnosis not present

## 2017-11-14 DIAGNOSIS — R4182 Altered mental status, unspecified: Secondary | ICD-10-CM

## 2017-11-14 DIAGNOSIS — R4020 Unspecified coma: Secondary | ICD-10-CM | POA: Diagnosis not present

## 2017-11-14 DIAGNOSIS — Z950 Presence of cardiac pacemaker: Secondary | ICD-10-CM | POA: Insufficient documentation

## 2017-11-14 DIAGNOSIS — F0390 Unspecified dementia without behavioral disturbance: Secondary | ICD-10-CM | POA: Insufficient documentation

## 2017-11-14 LAB — COMPREHENSIVE METABOLIC PANEL
ALT: 10 U/L (ref 0–44)
AST: 16 U/L (ref 15–41)
Albumin: 3.4 g/dL — ABNORMAL LOW (ref 3.5–5.0)
Alkaline Phosphatase: 69 U/L (ref 38–126)
Anion gap: 8 (ref 5–15)
BUN: 11 mg/dL (ref 8–23)
CHLORIDE: 106 mmol/L (ref 98–111)
CO2: 26 mmol/L (ref 22–32)
Calcium: 8.4 mg/dL — ABNORMAL LOW (ref 8.9–10.3)
Creatinine, Ser: 0.7 mg/dL (ref 0.44–1.00)
Glucose, Bld: 90 mg/dL (ref 70–99)
POTASSIUM: 3.5 mmol/L (ref 3.5–5.1)
Sodium: 140 mmol/L (ref 135–145)
Total Bilirubin: 0.9 mg/dL (ref 0.3–1.2)
Total Protein: 6.4 g/dL — ABNORMAL LOW (ref 6.5–8.1)

## 2017-11-14 LAB — URINALYSIS, ROUTINE W REFLEX MICROSCOPIC
Bilirubin Urine: NEGATIVE
Glucose, UA: NEGATIVE mg/dL
Hgb urine dipstick: NEGATIVE
Ketones, ur: 5 mg/dL — AB
Leukocytes, UA: NEGATIVE
NITRITE: NEGATIVE
Protein, ur: NEGATIVE mg/dL
SPECIFIC GRAVITY, URINE: 1.017 (ref 1.005–1.030)
pH: 6 (ref 5.0–8.0)

## 2017-11-14 LAB — CBC WITH DIFFERENTIAL/PLATELET
ABS IMMATURE GRANULOCYTES: 0.03 10*3/uL (ref 0.00–0.07)
BASOS ABS: 0.1 10*3/uL (ref 0.0–0.1)
Basophils Relative: 1 %
Eosinophils Absolute: 0.4 10*3/uL (ref 0.0–0.5)
Eosinophils Relative: 4 %
HCT: 42.6 % (ref 36.0–46.0)
Hemoglobin: 13.4 g/dL (ref 12.0–15.0)
IMMATURE GRANULOCYTES: 0 %
LYMPHS PCT: 12 %
Lymphs Abs: 1.2 10*3/uL (ref 0.7–4.0)
MCH: 29.3 pg (ref 26.0–34.0)
MCHC: 31.5 g/dL (ref 30.0–36.0)
MCV: 93 fL (ref 80.0–100.0)
Monocytes Absolute: 1 10*3/uL (ref 0.1–1.0)
Monocytes Relative: 10 %
NEUTROS ABS: 7.1 10*3/uL (ref 1.7–7.7)
NEUTROS PCT: 73 %
NRBC: 0 % (ref 0.0–0.2)
Platelets: 231 10*3/uL (ref 150–400)
RBC: 4.58 MIL/uL (ref 3.87–5.11)
RDW: 12.5 % (ref 11.5–15.5)
WBC: 9.7 10*3/uL (ref 4.0–10.5)

## 2017-11-14 LAB — CBG MONITORING, ED: Glucose-Capillary: 94 mg/dL (ref 70–99)

## 2017-11-14 LAB — LACTIC ACID, PLASMA
LACTIC ACID, VENOUS: 1.3 mmol/L (ref 0.5–1.9)
LACTIC ACID, VENOUS: 1.5 mmol/L (ref 0.5–1.9)

## 2017-11-14 MED ORDER — SODIUM CHLORIDE 0.9 % IV SOLN
Freq: Once | INTRAVENOUS | Status: AC
  Administered 2017-11-14: 18:00:00 via INTRAVENOUS

## 2017-11-14 NOTE — Discharge Instructions (Addendum)
Tests showed no obvious abnormalities.  Follow-up with your primary care doctor.

## 2017-11-14 NOTE — ED Triage Notes (Signed)
EMS brought pt in from home due to altered mental status. When EMS arrived pt laying in bed soaked in urine. This morning pt became confused and non verbal. Husband admitted to hospital last night. Pt is being treated for UTI and antibiotics have been changed due to reaction

## 2017-11-14 NOTE — ED Provider Notes (Signed)
[facility] EMERGENCY DEPARTMENT Provider Note   CSN: [id number] Arrival date & time: 11/14/17  1651     History   Chief Complaint Chief Complaint  Patient presents with  . Altered Mental Status    HPI [name] is a 84 y.o. female.  Level 5 caveat for altered mental status.  There are no family members here to interrogate.  EMS noted patient lying in bed soaked in urine.  Her husband was admitted to the hospital last night.  She has been recently treated for urinary tract infection.  No other history available to me at this time.     Past Medical History:  Diagnosis Date  . Anemia   . Arthritis    "fingers; back" (02/09/2013)  . Asthma   . CAD (coronary artery disease)    CABG surgery in 06/2005 for left main disease; normal EF; negative stress nuclear 08/2008  . Cerebrovascular disease    1993-CVA; 08/2008-mild atherosclerosis without focal stenosis  . Chronic back pain greater than 3 months duration    "nerve damage down into left leg" (02/09/2013)  . Chronic bronchitis ([place])    "haven't had it in last 2-3 years; used to get it q yr" (02/09/2013)  . COPD (chronic obstructive pulmonary disease) ([place])   . Dementia ([place])    "more than mild; having more short term memory loss recently; on Aricept" (02/09/2013)  . Diverticulosis   . Dizziness    chronic  . Family history of anesthesia complication    "makes daughter sick" (02/09/2013)  . Frequent PVCs   . Frequent UTI   . GERD (gastroesophageal reflux disease)   . IRSWNIOE(703.5)    "sometimes weekly" (02/09/2013)  . High cholesterol   . Hypertension   . Mental disorder    mild dementia  . Orthostatic hypotension   . Osteopenia   . Pneumonia    "couple times; several years ago" (02/09/2013)  . Sinus bradycardia    s/p STJ Assurity dual chamber pacemaker by Dr Rayann Heman 02/2013  . Stroke ([place])    20-30 yrs ago  . Syncope     Negative even recorder dated 3/08  . Tobacco abuse    60 pack years; continuing    Patient  Active Problem List   Diagnosis Date Noted  . Acute blood loss anemia 04/11/2015  . Chronic chest pain 04/11/2015  . Dementia ([place]) 04/11/2015  . Lower GI bleed 04/06/2015  . Pacemaker 05/27/2014  . Balance disorder 10/05/2013  . Dizziness of unknown cause 10/05/2013  . Difficulty in walking(719.7) 10/05/2013  . Muscle weakness (generalized) 10/05/2013  . Hypokalemia 09/21/2013  . Acute bronchitis 09/20/2013  . SIRS (systemic inflammatory response syndrome) ([place]) 09/20/2013  . Atypical chest pain 09/20/2013  . UTI (lower urinary tract infection) 09/20/2013  . Dyspnea 09/20/2013  . Anemia 02/09/2013  . Sick sinus syndrome ([place]) 01/27/2013  . Premature ventricular contraction 01/27/2013  . CAD (coronary artery disease) 01/27/2013  . Essential hypertension 01/27/2013  . Hx of falling, presenting hazards to health 12/21/2012  . Laboratory test 05/09/2011  . Arteriosclerotic cardiovascular disease (ASCVD)   . Cerebrovascular disease   . Syncope   . Hyperlipidemia 05/07/2009  . Tobacco abuse 05/07/2009  . Orthostatic hypotension 05/07/2009  . GERD (gastroesophageal reflux disease) 09/19/2008  . Osteopenia 09/19/2008    Past Surgical History:  Procedure Laterality Date  . ABDOMINAL HYSTERECTOMY  1990  . APPENDECTOMY    . CARDIAC CATHETERIZATION  2007  . CATARACT EXTRACTION, BILATERAL    .  COLONOSCOPY  10/10/2011   Procedure: COLONOSCOPY;  Surgeon: Rogene Houston, MD;  [facility];  Service: Endoscopy;  Laterality: N/A;  1230  . COLONOSCOPY N/A 04/12/2015   Procedure: COLONOSCOPY;  Surgeon: Rogene Houston, MD;  [facility];  Service: Endoscopy;  Laterality: N/A;  . CORONARY ARTERY BYPASS GRAFT  2007   Left main disease  . ESOPHAGOGASTRODUODENOSCOPY  10/10/2011   Procedure: ESOPHAGOGASTRODUODENOSCOPY (EGD);  Surgeon: Rogene Houston, MD;  [facility];  Service: Endoscopy;  Laterality: N/A;  . ESOPHAGOGASTRODUODENOSCOPY N/A 04/08/2015    Procedure: ESOPHAGOGASTRODUODENOSCOPY (EGD);  Surgeon: Rogene Houston, MD;  [facility];  Service: Endoscopy;  Laterality: N/A;  . HERNIA REPAIR    . PACEMAKER INSERTION  02/09/2013   STJ Assurity pacemaker implanted by Dr Rayann Heman for symptomatic bradycardia  . PERMANENT PACEMAKER INSERTION N/A 02/09/2013   Procedure: PERMANENT PACEMAKER INSERTION;  Surgeon: Coralyn Mark, MD;  Location: [place] CATH LAB;  Service: Cardiovascular;  Laterality: N/A;  . TONSILLECTOMY    . VENTRAL HERNIA REPAIR       OB History    Gravida  3   Para  3   Term  3   Preterm      AB      Living        SAB      TAB      Ectopic      Multiple      Live Births               Home Medications    Prior to Admission medications   Medication Sig Start Date End Date Taking? Authorizing Provider  Acetaminophen 500 MG coapsule Take 650 mg by mouth every 6 (six) hours as needed for mild pain. For pain   Yes [provider]  busPIRone (BUSPAR) 5 MG tablet Take 5 mg by mouth 2 (two) times daily.  04/14/15  Yes [provider]  Calcium-Vitamin D (CALTRATE 600 PLUS-VIT D PO) Take 1 tablet by mouth at bedtime.    Yes [provider]  Cyanocobalamin (VITAMIN B-12 CR PO) Take 1 tablet by mouth 2 (two) times daily.   Yes [provider]  donepezil (ARICEPT) 10 MG tablet Take 10 mg by mouth daily.    Yes [provider]  ferrous sulfate 325 (65 FE) MG tablet Take 1 tablet (325 mg total) by mouth 2 (two) times daily with a meal. Patient taking differently: Take 325 mg by mouth daily.  04/09/15  Yes Regalado, [name], MD  levothyroxine (SYNTHROID, LEVOTHROID) 50 MCG tablet Take 1 tablet by mouth daily. 08/12/17  Yes [provider]  memantine (NAMENDA) 5 MG tablet Take 1 tablet by mouth 2 (two) times daily. 10/09/17  Yes [provider]  Multiple Vitamins-Minerals (PRESERVISION AREDS PO) Take 2 capsules by mouth daily.    Yes [provider]   nitrofurantoin (MACRODANTIN) 100 MG capsule Take 100 mg by mouth 2 (two) times daily.   Yes [provider]  nitroGLYCERIN (NITROSTAT) 0.4 MG SL tablet PLACE ONE TABLET UNDER THE TONGUE EVERY 5 MINUTES AS NEEDED FOR CHESTPAIN DO NOT EXCEED 3 IN 24 HOURS 04/09/17  Yes Herminio Commons, MD  Omega-3 Fatty Acids (FISH OIL) 1200 MG CAPS Take 1,200 mg by mouth at bedtime.    Yes [provider]  omeprazole (PRILOSEC) 20 MG capsule Take 2 capsules (40 mg total) by mouth daily. Patient taking differently: Take 20 mg  by mouth 2 (two) times daily before a meal.  04/09/15  Yes Regalado, [name], MD  simethicone (MYLICON) 80 MG chewable tablet Chew 1 tablet (80 mg total) by mouth every 6 (six) hours as needed for flatulence. 04/09/15  Yes Regalado, Cassie Freer, MD    Family History Family History  Problem Relation Age of Onset  . Angina Mother   . Heart attack Mother   . Heart disease Unknown   . Arthritis Unknown   . Lung disease Unknown   . Asthma Unknown     Social History Social History   Tobacco Use  . Smoking status: Former Smoker    Packs/day: 0.50    Years: 60.00    Pack years: 30.00    Types: Cigarettes    Start date: 12/12/1952    Last attempt to quit: 10/20/2006    Years since quitting: 11.0  . Smokeless tobacco: Never Used  . Tobacco comment: 02/09/2013 "quit smoking in ~ 2009"  Substance Use Topics  . Alcohol use: No    Alcohol/week: 0.0 standard drinks  . Drug use: No     Allergies   Ativan [lorazepam]; Cephalexin; Contrast media [iodinated diagnostic agents]; Haldol [haloperidol lactate]; Iohexol; Penicillins; Shellfish allergy; and Sulfa antibiotics   Review of Systems Review of Systems  Unable to perform ROS: Mental status change     Physical Exam Updated Vital Signs BP (!) 199/96   Pulse 73   Temp 98.9 F (37.2 C) (Rectal)   Resp (!) 21   Wt 54 kg   SpO2 99%   BMI 20.43 kg/m   Physical Exam  Constitutional:  Conversant, confused    HENT:  Head: Normocephalic and atraumatic.  Eyes: Conjunctivae are normal.  Neck: Neck supple.  Cardiovascular: Normal rate and regular rhythm.  Pulmonary/Chest: Effort normal and breath sounds normal.  Abdominal: Soft. Bowel sounds are normal.  Musculoskeletal: Normal range of motion.  Neurological: She is alert.  Knows name, but not place or time  Skin: Skin is warm and dry.  Psychiatric:  Confused.  Nursing note and vitals reviewed.    ED Treatments / Results  Labs (all labs ordered are listed, but only abnormal results are displayed) Labs Reviewed  COMPREHENSIVE METABOLIC PANEL - Abnormal; Notable for the following components:      Result Value   Calcium 8.4 (*)    Total Protein 6.4 (*)    Albumin 3.4 (*)    All other components within normal limits  URINALYSIS, ROUTINE W REFLEX MICROSCOPIC - Abnormal; Notable for the following components:   APPearance HAZY (*)    Ketones, ur 5 (*)    All other components within normal limits  URINE CULTURE  CBC WITH DIFFERENTIAL/PLATELET  LACTIC ACID, PLASMA  LACTIC ACID, PLASMA  CBG MONITORING, ED    EKG EKG Interpretation  Date/Time:  Friday November 14 2017 17:07:51 EST Ventricular Rate:  73 PR Interval:    QRS Duration: 105 QT Interval:  425 QTC Calculation: 469 R Axis:   81 Text Interpretation:  Sinus rhythm Ventricular premature complex Borderline right axis deviation Minimal ST depression, diffuse leads Confirmed by Nat Christen [phone]) on 11/14/2017 5:35:25 PM   Radiology Dg Chest 2 View  Result Date: 11/14/2017 CLINICAL DATA:  Altered mental status EXAM: CHEST - 2 VIEW COMPARISON:  01/30/2017 FINDINGS: Post sternotomy changes. Left-sided pacing device as before. Pulmonary hyperinflation. No acute consolidation or effusion. Aortic atherosclerosis. No pneumothorax. IMPRESSION: No active cardiopulmonary disease. Hyperinflation. Mild cardiomegaly. Electronically Signed  By: Donavan Foil M.D.   On: 11/14/2017 18:29     Procedures Procedures (including critical care time)  Medications Ordered in ED Medications  0.9 %  sodium chloride infusion ( Intravenous Rate/Dose Verify 11/14/17 1851)     Initial Impression / Assessment and Plan / ED Course  I have reviewed the triage vital signs and the nursing notes.  Pertinent labs & imaging results that were available during my care of the patient were reviewed by me and considered in my medical decision making (see chart for details).     Patient found at home in bed lying in urine.  No other history available to me at this time.  Screening tests including labs, chest x-ray, urinalysis showed no acute findings.  CT head pending.  Will admit to general medicine.   CRITICAL CARE Performed by: Nat Christen Total critical care time: 30 minutes Critical care time was exclusive of separately billable procedures and treating other patients. Critical care was necessary to treat or prevent imminent or life-threatening deterioration. Critical care was time spent personally by me on the following activities: development of treatment plan with patient and/or surrogate as well as nursing, discussions with consultants, evaluation of patient's response to treatment, examination of patient, obtaining history from patient or surrogate, ordering and performing treatments and interventions, ordering and review of laboratory studies, ordering and review of radiographic studies, pulse oximetry and re-evaluation of patient's condition.  Final Clinical Impressions(s) / ED Diagnoses   Final diagnoses:  Altered mental status, unspecified altered mental status type    ED Discharge Orders    None       Nat Christen, MD 11/14/17 2005

## 2017-11-16 LAB — URINE CULTURE
Culture: NO GROWTH
Special Requests: NORMAL

## 2017-12-23 DIAGNOSIS — K047 Periapical abscess without sinus: Secondary | ICD-10-CM | POA: Diagnosis not present

## 2017-12-23 DIAGNOSIS — K0889 Other specified disorders of teeth and supporting structures: Secondary | ICD-10-CM | POA: Diagnosis not present

## 2017-12-23 DIAGNOSIS — Z23 Encounter for immunization: Secondary | ICD-10-CM | POA: Diagnosis not present

## 2017-12-23 DIAGNOSIS — Z6820 Body mass index (BMI) 20.0-20.9, adult: Secondary | ICD-10-CM | POA: Diagnosis not present

## 2017-12-26 ENCOUNTER — Encounter: Payer: Self-pay | Admitting: Cardiovascular Disease

## 2017-12-26 ENCOUNTER — Ambulatory Visit: Payer: Medicare Other | Admitting: Cardiovascular Disease

## 2017-12-26 VITALS — BP 144/84 | HR 68 | Wt 120.0 lb

## 2017-12-26 DIAGNOSIS — I10 Essential (primary) hypertension: Secondary | ICD-10-CM | POA: Diagnosis not present

## 2017-12-26 DIAGNOSIS — M25473 Effusion, unspecified ankle: Secondary | ICD-10-CM

## 2017-12-26 DIAGNOSIS — Z950 Presence of cardiac pacemaker: Secondary | ICD-10-CM | POA: Diagnosis not present

## 2017-12-26 DIAGNOSIS — E7800 Pure hypercholesterolemia, unspecified: Secondary | ICD-10-CM

## 2017-12-26 DIAGNOSIS — Z8673 Personal history of transient ischemic attack (TIA), and cerebral infarction without residual deficits: Secondary | ICD-10-CM

## 2017-12-26 DIAGNOSIS — I495 Sick sinus syndrome: Secondary | ICD-10-CM

## 2017-12-26 DIAGNOSIS — M25476 Effusion, unspecified foot: Secondary | ICD-10-CM

## 2017-12-26 DIAGNOSIS — I25708 Atherosclerosis of coronary artery bypass graft(s), unspecified, with other forms of angina pectoris: Secondary | ICD-10-CM | POA: Diagnosis not present

## 2017-12-26 DIAGNOSIS — M25475 Effusion, left foot: Secondary | ICD-10-CM

## 2017-12-26 NOTE — Patient Instructions (Signed)
Medication Instructions:  Your physician recommends that you continue on your current medications as directed. Please refer to the Current Medication list given to you today.  If you need a refill on your cardiac medications before your next appointment, please call your pharmacy.   Lab work: NONE  If you have labs (blood work) drawn today and your tests are completely normal, you will receive your results only by: Marland Kitchen MyChart Message (if you have MyChart) OR . A paper copy in the mail If you have any lab test that is abnormal or we need to change your treatment, we will call you to review the results.  Testing/Procedures: NONE   Follow-Up: At [facility], you and your health needs are our priority.  As part of our continuing mission to provide you with exceptional heart care, we have created designated Provider Care Teams.  These Care Teams include your primary Cardiologist (physician) and Advanced Practice Providers (APPs -  Physician Assistants and Nurse Practitioners) who all work together to provide you with the care you need, when you need it. You will need a follow up appointment in October .  Please call our office 2 months in advance to schedule this appointment.  You may see Kate Sable, MD or one of the following Advanced Practice Providers on your designated Care Team:   Bernerd Pho, PA-C [facility]) . Ermalinda Barrios, PA-C ([place])  Any Other Special Instructions Will Be Listed Below (If Applicable). Thank you for choosing [facility]!

## 2017-12-26 NOTE — Progress Notes (Signed)
SUBJECTIVE: The patient presents for routine follow-up.  She has a history of coronary artery disease and CABG as well as sick sinus syndrome for which she has a pacemaker.  She is here with her daughter, [name].  She was evaluated for altered mental status in the ED on 11/14/2017.  Only ED provider documentation is available.  Lactic acid, sodium, potassium, BUN, CBC, and creatinine were normal.  Urinalysis was unremarkable.  Head CT showed atrophy and small vessel ischemic changes with remote right parietal infarct.  Chest x-ray showed no active cardiopulmonary disease.  I personally reviewed the ECG which demonstrated sinus rhythm with occasionally paced complexes and an isolated PVC.  She has advanced dementia. Her husband passed away earlier this year.  She has some bilateral ankle and feet swelling and her PCP recommended compression stockings.  She denies chest pain and shortness of breath. Her appetite is good.    Review of Systems: As per "subjective", otherwise negative.  Allergies  Allergen Reactions  . Ativan [Lorazepam]     Aggressive, hallucination  . Cephalexin Hives  . Contrast Media [Iodinated Diagnostic Agents]     Unknown  . Haldol [Haloperidol Lactate]     Aggressive, hallucination  . Iohexol     Urticaria, emesis, numbness during IVP performed at [facility] years ago  . Penicillins Hives    Has patient had a PCN reaction causing immediate rash, facial/tongue/throat swelling, SOB or lightheadedness with hypotension: Yes Has patient had a PCN reaction causing severe rash involving mucus membranes or skin necrosis: No Has patient had a PCN reaction that required hospitalization NoNo Has patient had a PCN reaction occurring within the last 10 years: No If all of the above answers are "NO", then may proceed with Cephalosporin use.   . Shellfish Allergy Other (See Comments)    Unknown  . Sulfa Antibiotics Hives    Current Outpatient  Medications  Medication Sig Dispense Refill  . Acetaminophen 500 MG coapsule Take 650 mg by mouth every 6 (six) hours as needed for mild pain. For pain    . busPIRone (BUSPAR) 5 MG tablet Take 5 mg by mouth 2 (two) times daily.     . Calcium-Vitamin D (CALTRATE 600 PLUS-VIT D PO) Take 1 tablet by mouth at bedtime.     . Cyanocobalamin (VITAMIN B-12 CR PO) Take 1 tablet by mouth 2 (two) times daily.    Marland Kitchen donepezil (ARICEPT) 10 MG tablet Take 10 mg by mouth daily.     . ferrous sulfate 325 (65 FE) MG tablet Take 1 tablet (325 mg total) by mouth 2 (two) times daily with a meal. (Patient taking differently: Take 325 mg by mouth daily. ) 60 tablet 3  . levothyroxine (SYNTHROID, LEVOTHROID) 50 MCG tablet Take 1 tablet by mouth daily.    . memantine (NAMENDA) 5 MG tablet Take 1 tablet by mouth 2 (two) times daily.    . Multiple Vitamins-Minerals (PRESERVISION AREDS PO) Take 2 capsules by mouth daily.     . nitrofurantoin (MACRODANTIN) 100 MG capsule Take 100 mg by mouth 2 (two) times daily.    . nitroGLYCERIN (NITROSTAT) 0.4 MG SL tablet PLACE ONE TABLET UNDER THE TONGUE EVERY 5 MINUTES AS NEEDED FOR CHESTPAIN DO NOT EXCEED 3 IN 24 HOURS 25 tablet 3  . Omega-3 Fatty Acids (FISH OIL) 1200 MG CAPS Take 1,200 mg by mouth at bedtime.     Marland Kitchen omeprazole (PRILOSEC) 20 MG capsule Take 2 capsules (40 mg  total) by mouth daily. (Patient taking differently: Take 20 mg by mouth 2 (two) times daily before a meal. ) 90 capsule 3  . simethicone (MYLICON) 80 MG chewable tablet Chew 1 tablet (80 mg total) by mouth every 6 (six) hours as needed for flatulence. 30 tablet 0   No current facility-administered medications for this visit.     Past Medical History:  Diagnosis Date  . Anemia   . Arthritis    "fingers; back" (02/09/2013)  . Asthma   . CAD (coronary artery disease)    CABG surgery in 06/2005 for left main disease; normal EF; negative stress nuclear 08/2008  . Cerebrovascular disease    1993-CVA; 08/2008-mild  atherosclerosis without focal stenosis  . Chronic back pain greater than 3 months duration    "nerve damage down into left leg" (02/09/2013)  . Chronic bronchitis ([place])    "haven't had it in last 2-3 years; used to get it q yr" (02/09/2013)  . COPD (chronic obstructive pulmonary disease) ([place])   . Dementia ([place])    "more than mild; having more short term memory loss recently; on Aricept" (02/09/2013)  . Diverticulosis   . Dizziness    chronic  . Family history of anesthesia complication    "makes daughter sick" (02/09/2013)  . Frequent PVCs   . Frequent UTI   . GERD (gastroesophageal reflux disease)   . IWPYKDXI(338.2)    "sometimes weekly" (02/09/2013)  . High cholesterol   . Hypertension   . Mental disorder    mild dementia  . Orthostatic hypotension   . Osteopenia   . Pneumonia    "couple times; several years ago" (02/09/2013)  . Sinus bradycardia    s/p STJ Assurity dual chamber pacemaker by Dr Rayann Heman 02/2013  . Stroke ([place])    20-30 yrs ago  . Syncope     Negative even recorder dated 3/08  . Tobacco abuse    60 pack years; continuing    Past Surgical History:  Procedure Laterality Date  . ABDOMINAL HYSTERECTOMY  1990  . APPENDECTOMY    . CARDIAC CATHETERIZATION  2007  . CATARACT EXTRACTION, BILATERAL    . COLONOSCOPY  10/10/2011   Procedure: COLONOSCOPY;  Surgeon: Rogene Houston, MD;  [facility];  Service: Endoscopy;  Laterality: N/A;  1230  . COLONOSCOPY N/A 04/12/2015   Procedure: COLONOSCOPY;  Surgeon: Rogene Houston, MD;  [facility];  Service: Endoscopy;  Laterality: N/A;  . CORONARY ARTERY BYPASS GRAFT  2007   Left main disease  . ESOPHAGOGASTRODUODENOSCOPY  10/10/2011   Procedure: ESOPHAGOGASTRODUODENOSCOPY (EGD);  Surgeon: Rogene Houston, MD;  [facility];  Service: Endoscopy;  Laterality: N/A;  . ESOPHAGOGASTRODUODENOSCOPY N/A 04/08/2015   Procedure: ESOPHAGOGASTRODUODENOSCOPY (EGD);  Surgeon: Rogene Houston, MD;  [facility]  END[facility];  Service: Endoscopy;  Laterality: N/A;  . HERNIA REPAIR    . PACEMAKER INSERTION  02/09/2013   STJ Assurity pacemaker implanted by Dr Rayann Heman for symptomatic bradycardia  . PERMANENT PACEMAKER INSERTION N/A 02/09/2013   Procedure: PERMANENT PACEMAKER INSERTION;  Surgeon: Coralyn Mark, MD;  Location: [facility] CATH LAB;  Service: Cardiovascular;  Laterality: N/A;  . TONSILLECTOMY    . VENTRAL HERNIA REPAIR      Social History   Socioeconomic History  . Marital status: Married    Spouse name: Percell Miller   . Number of children: Not on file  . Years of education: 84  . Highest education level: Not on file  Occupational History  . Occupation: retired  Scientific laboratory technician  . Financial resource strain: Not on file  . Food insecurity:    Worry: Not on file    Inability: Not on file  . Transportation needs:    Medical: Not on file    Non-medical: Not on file  Tobacco Use  . Smoking status: Former Smoker    Packs/day: 0.50    Years: 60.00    Pack years: 30.00    Types: Cigarettes    Start date: 12/12/1952    Last attempt to quit: 10/20/2006    Years since quitting: 11.1  . Smokeless tobacco: Never Used  . Tobacco comment: 02/09/2013 "quit smoking in ~ 2009"  Substance and Sexual Activity  . Alcohol use: No    Alcohol/week: 0.0 standard drinks  . Drug use: No  . Sexual activity: Never  Lifestyle  . Physical activity:    Days per week: Not on file    Minutes per session: Not on file  . Stress: Not on file  Relationships  . Social connections:    Talks on phone: Not on file    Gets together: Not on file    Attends religious service: Not on file    Active member of club or organization: Not on file    Attends meetings of clubs or organizations: Not on file    Relationship status: Not on file  . Intimate partner violence:    Fear of current or ex partner: Not on file    Emotionally abused: Not on file    Physically abused: Not on file    Forced sexual activity: Not on file  Other  Topics Concern  . Not on file  Social History Narrative   3 children    Right handed   HS    Drinks decaff     Vitals:   12/26/17 1425  BP: (!) 144/84  Pulse: 68  SpO2: 94%  Weight: 120 lb (54.4 kg)    Wt Readings from Last 3 Encounters:  12/26/17 120 lb (54.4 kg)  11/14/17 119 lb 0.8 oz (54 kg)  05/27/17 119 lb (54 kg)     PHYSICAL EXAM General: NAD HEENT: Normal. Neck: No JVD, no thyromegaly. Lungs: Clear to auscultation bilaterally with normal respiratory effort. CV: Regular rate and rhythm, normal S1/S2, no S3/S4, no murmur. Trace bilateral periankle edema.   Abdomen: Soft, nontender, no distention.  Neurologic: Alert.  Psych: Normal affect. Skin: Normal. Musculoskeletal: No gross deformities.    ECG: Reviewed above under Subjective   Labs: Lab Results  Component Value Date/Time   K 3.5 11/14/2017 05:20 PM   BUN 11 11/14/2017 05:20 PM   CREATININE 0.70 11/14/2017 05:20 PM   CREATININE 0.71 01/18/2013 03:42 PM   ALT 10 11/14/2017 05:20 PM   TSH 3.297 04/11/2015 04:16 PM   TSH 2.680 09/20/2013 10:42 AM   HGB 13.4 11/14/2017 05:20 PM     Lipids: Lab Results  Component Value Date/Time   LDLCALC 50 09/20/2013 10:42 AM   CHOL 144 09/20/2013 10:42 AM   TRIG 50 09/20/2013 10:42 AM   HDL 84 09/20/2013 10:42 AM       ASSESSMENT AND PLAN:  1. Sick sinus syndrome s/p pacemaker: Normal device function. Follows with Dr. Lovena Le.   2. CAD s/p CABG:Symptomatically stable. Normal stress test in 10/2015.Not on ASA due to h/o GI bleeding. Not on statin due to weakness and falls (stopped by daughter). I am not inclined to restart statin therapy.  3. Essential HTN: Blood pressure is mildly elevated.  No changes today.  4. Hyperlipidemia: On fish oil. Not on statin due to weakness and falls (stopped by daughter). I am not inclined to restart statin therapy.  5. H/o CVA with frequent falls and generalized weakness: She is not on aspirin due to a history of  GI bleeding. She is also not on statin therapy.  6. Bilateral ankle/feet swelling: I will provide a prescription for knee high compression stockings.   Disposition: Follow up with Dr. Lovena Le in May 2020. Follow up with me in October 2020.   Kate Sable, M.D., F.A.C.C.

## 2018-01-05 LAB — CUP PACEART REMOTE DEVICE CHECK
Battery Remaining Longevity: 125 mo
Battery Remaining Percentage: 95.5 %
Battery Voltage: 2.99 V
Brady Statistic AP VP Percent: 1 %
Brady Statistic AS VS Percent: 48 %
Brady Statistic RA Percent Paced: 33 %
Implantable Lead Implant Date: 20150203
Implantable Lead Implant Date: 20150203
Implantable Lead Location: 753859
Implantable Pulse Generator Implant Date: 20150203
Lead Channel Impedance Value: 480 Ohm
Lead Channel Pacing Threshold Amplitude: 0.75 V
Lead Channel Pacing Threshold Pulse Width: 0.4 ms
Lead Channel Pacing Threshold Pulse Width: 0.4 ms
Lead Channel Sensing Intrinsic Amplitude: 2.6 mV
Lead Channel Setting Pacing Amplitude: 2.5 V
Lead Channel Setting Pacing Pulse Width: 0.4 ms
MDC IDC LEAD LOCATION: 753860
MDC IDC MSMT LEADCHNL RV IMPEDANCE VALUE: 560 Ohm
MDC IDC MSMT LEADCHNL RV PACING THRESHOLD AMPLITUDE: 1 V
MDC IDC MSMT LEADCHNL RV SENSING INTR AMPL: 10.5 mV
MDC IDC PG SERIAL: 7586568
MDC IDC SESS DTM: 20191028060022
MDC IDC SET LEADCHNL RA PACING AMPLITUDE: 2 V
MDC IDC SET LEADCHNL RV SENSING SENSITIVITY: 2 mV
MDC IDC STAT BRADY AP VS PERCENT: 45 %
MDC IDC STAT BRADY AS VP PERCENT: 1 %
MDC IDC STAT BRADY RV PERCENT PACED: 1 %

## 2018-02-02 ENCOUNTER — Ambulatory Visit (INDEPENDENT_AMBULATORY_CARE_PROVIDER_SITE_OTHER): Payer: Medicare Other

## 2018-02-02 DIAGNOSIS — Z8673 Personal history of transient ischemic attack (TIA), and cerebral infarction without residual deficits: Secondary | ICD-10-CM

## 2018-02-02 DIAGNOSIS — I495 Sick sinus syndrome: Secondary | ICD-10-CM | POA: Diagnosis not present

## 2018-02-03 NOTE — Progress Notes (Signed)
Remote pacemaker transmission.   

## 2018-02-05 LAB — CUP PACEART REMOTE DEVICE CHECK
Battery Remaining Longevity: 127 mo
Battery Remaining Percentage: 95.5 %
Battery Voltage: 2.99 V
Brady Statistic AP VP Percent: 1 %
Brady Statistic AP VS Percent: 37 %
Brady Statistic AS VS Percent: 57 %
Date Time Interrogation Session: 20200127105937
Implantable Lead Implant Date: 20150203
Implantable Lead Implant Date: 20150203
Implantable Lead Model: 1948
Lead Channel Impedance Value: 600 Ohm
Lead Channel Pacing Threshold Amplitude: 0.75 V
Lead Channel Pacing Threshold Pulse Width: 0.4 ms
Lead Channel Sensing Intrinsic Amplitude: 3.2 mV
Lead Channel Setting Pacing Amplitude: 2.5 V
MDC IDC LEAD LOCATION: 753859
MDC IDC LEAD LOCATION: 753860
MDC IDC MSMT LEADCHNL RA IMPEDANCE VALUE: 490 Ohm
MDC IDC MSMT LEADCHNL RA PACING THRESHOLD PULSEWIDTH: 0.4 ms
MDC IDC MSMT LEADCHNL RV PACING THRESHOLD AMPLITUDE: 1 V
MDC IDC MSMT LEADCHNL RV SENSING INTR AMPL: 11.9 mV
MDC IDC PG IMPLANT DT: 20150203
MDC IDC PG SERIAL: 7586568
MDC IDC SET LEADCHNL RA PACING AMPLITUDE: 2 V
MDC IDC SET LEADCHNL RV PACING PULSEWIDTH: 0.4 ms
MDC IDC SET LEADCHNL RV SENSING SENSITIVITY: 2 mV
MDC IDC STAT BRADY AS VP PERCENT: 1 %
MDC IDC STAT BRADY RA PERCENT PACED: 28 %
MDC IDC STAT BRADY RV PERCENT PACED: 1 %

## 2018-02-18 DIAGNOSIS — I10 Essential (primary) hypertension: Secondary | ICD-10-CM | POA: Diagnosis not present

## 2018-02-18 DIAGNOSIS — R296 Repeated falls: Secondary | ICD-10-CM | POA: Diagnosis not present

## 2018-02-25 ENCOUNTER — Encounter: Payer: Self-pay | Admitting: Neurology

## 2018-03-11 ENCOUNTER — Ambulatory Visit (INDEPENDENT_AMBULATORY_CARE_PROVIDER_SITE_OTHER): Payer: Medicare Other | Admitting: Internal Medicine

## 2018-03-11 ENCOUNTER — Encounter (INDEPENDENT_AMBULATORY_CARE_PROVIDER_SITE_OTHER): Payer: Self-pay | Admitting: Internal Medicine

## 2018-03-11 VITALS — BP 150/80 | HR 72 | Ht 64.0 in | Wt 120.0 lb

## 2018-03-11 DIAGNOSIS — K31811 Angiodysplasia of stomach and duodenum with bleeding: Secondary | ICD-10-CM | POA: Diagnosis not present

## 2018-03-11 NOTE — Progress Notes (Signed)
Subjective:    Patient ID: [name], female    DOB: August 17, 1925, 85 y.o.   MRN: [id number]  HPI Here today for f/u. Last see in March of 2019. Hx of GIB. In 2017 started having maroon stools. Admitted to [facility] and underwent a colonoscopy and EGD (see below). She has not had any further bleeding. Daughter is providing the hx. Patient is confused. Her appetite is good.     EGD by Dr. Laural Golden 04/08/2015 for chronic blood loss. Normal esophagus. Gastric mucosal atrophy. No bleeding source identified in upper GI tract. On 04/12/2015 she underwent a colonoscopy which revealed 2 non bleeding colonic angiodysplastic lesions at ascending colon. Diverticulosis in the sigmoid colon without stigmata of active bleeding, but felt to be source of GI bleed.  Patient was evaluated for GI bleed and anemia in October 2013 and underwent EGD and colonoscopy. EGD was normal and colonoscopy revealed few small left-sided AV malformations in diverticulosis.    CBC    Component Value Date/Time   WBC 9.7 11/14/2017 1720   RBC 4.58 11/14/2017 1720   HGB 13.4 11/14/2017 1720   HCT 42.6 11/14/2017 1720   PLT 231 11/14/2017 1720   MCV 93.0 11/14/2017 1720   MCH 29.3 11/14/2017 1720   MCHC 31.5 11/14/2017 1720   RDW 12.5 11/14/2017 1720   LYMPHSABS 1.2 11/14/2017 1720   MONOABS 1.0 11/14/2017 1720   EOSABS 0.4 11/14/2017 1720   BASOSABS 0.1 11/14/2017 1720    Review of Systems     Past Medical History:  Diagnosis Date  . Anemia   . Arthritis    "fingers; back" (02/09/2013)  . Asthma   . CAD (coronary artery disease)    CABG surgery in 06/2005 for left main disease; normal EF; negative stress nuclear 08/2008  . Cerebrovascular disease    1993-CVA; 08/2008-mild atherosclerosis without focal stenosis  . Chronic back pain greater than 3 months duration    "nerve damage down into left leg" (02/09/2013)  . Chronic bronchitis ([place])    "haven't had it in last 2-3 years; used to get it q yr" (02/09/2013)  . COPD  (chronic obstructive pulmonary disease) ([place])   . Dementia ([place])    "more than mild; having more short term memory loss recently; on Aricept" (02/09/2013)  . Diverticulosis   . Dizziness    chronic  . Family history of anesthesia complication    "makes daughter sick" (02/09/2013)  . Frequent PVCs   . Frequent UTI   . GERD (gastroesophageal reflux disease)   . LOVFIEPP(295.1)    "sometimes weekly" (02/09/2013)  . High cholesterol   . Hypertension   . Mental disorder    mild dementia  . Orthostatic hypotension   . Osteopenia   . Pneumonia    "couple times; several years ago" (02/09/2013)  . Sinus bradycardia    s/p STJ Assurity dual chamber pacemaker by Dr Rayann Heman 02/2013  . Stroke ([place])    20-30 yrs ago  . Syncope     Negative even recorder dated 3/08  . Tobacco abuse    60 pack years; continuing    Past Surgical History:  Procedure Laterality Date  . ABDOMINAL HYSTERECTOMY  1990  . APPENDECTOMY    . CARDIAC CATHETERIZATION  2007  . CATARACT EXTRACTION, BILATERAL    . COLONOSCOPY  10/10/2011   Procedure: COLONOSCOPY;  Surgeon: Rogene Houston, MD;  [facility];  Service: Endoscopy;  Laterality: N/A;  1230  . COLONOSCOPY N/A 04/12/2015  Procedure: COLONOSCOPY;  Surgeon: Rogene Houston, MD;  [facility];  Service: Endoscopy;  Laterality: N/A;  . CORONARY ARTERY BYPASS GRAFT  2007   Left main disease  . ESOPHAGOGASTRODUODENOSCOPY  10/10/2011   Procedure: ESOPHAGOGASTRODUODENOSCOPY (EGD);  Surgeon: Rogene Houston, MD;  [facility];  Service: Endoscopy;  Laterality: N/A;  . ESOPHAGOGASTRODUODENOSCOPY N/A 04/08/2015   Procedure: ESOPHAGOGASTRODUODENOSCOPY (EGD);  Surgeon: Rogene Houston, MD;  [facility];  Service: Endoscopy;  Laterality: N/A;  . HERNIA REPAIR    . PACEMAKER INSERTION  02/09/2013   STJ Assurity pacemaker implanted by Dr Rayann Heman for symptomatic bradycardia  . PERMANENT PACEMAKER INSERTION N/A 02/09/2013   Procedure: PERMANENT  PACEMAKER INSERTION;  Surgeon: Coralyn Mark, MD;  Location: [facility] CATH LAB;  Service: Cardiovascular;  Laterality: N/A;  . TONSILLECTOMY    . VENTRAL HERNIA REPAIR      Allergies  Allergen Reactions  . Ativan [Lorazepam]     Aggressive, hallucination  . Cephalexin Hives  . Contrast Media [Iodinated Diagnostic Agents]     Unknown  . Haldol [Haloperidol Lactate]     Aggressive, hallucination  . Iohexol     Urticaria, emesis, numbness during IVP performed at [facility] years ago  . Penicillins Hives    Has patient had a PCN reaction causing immediate rash, facial/tongue/throat swelling, SOB or lightheadedness with hypotension: Yes Has patient had a PCN reaction causing severe rash involving mucus membranes or skin necrosis: No Has patient had a PCN reaction that required hospitalization NoNo Has patient had a PCN reaction occurring within the last 10 years: No If all of the above answers are "NO", then may proceed with Cephalosporin use.   . Shellfish Allergy Other (See Comments)    Unknown  . Sulfa Antibiotics Hives    Current Outpatient Medications on File Prior to Visit  Medication Sig Dispense Refill  . Acetaminophen 500 MG coapsule Take 650 mg by mouth every 6 (six) hours as needed for mild pain. For pain    . busPIRone (BUSPAR) 5 MG tablet Take 5 mg by mouth 2 (two) times daily.     . Calcium-Vitamin D (CALTRATE 600 PLUS-VIT D PO) Take 1 tablet by mouth at bedtime.     . Cyanocobalamin (VITAMIN B-12 CR PO) Take 1 tablet by mouth 2 (two) times daily.    Marland Kitchen donepezil (ARICEPT) 10 MG tablet Take 10 mg by mouth daily.     . ferrous sulfate 325 (65 FE) MG tablet Take 1 tablet (325 mg total) by mouth 2 (two) times daily with a meal. (Patient taking differently: Take 325 mg by mouth daily. ) 60 tablet 3  . levothyroxine (SYNTHROID, LEVOTHROID) 50 MCG tablet Take 1 tablet by mouth daily.    . memantine (NAMENDA) 5 MG tablet Take 1 tablet by mouth 2 (two) times daily.    .  nitrofurantoin (MACRODANTIN) 100 MG capsule Take 100 mg by mouth 2 (two) times daily.    . nitroGLYCERIN (NITROSTAT) 0.4 MG SL tablet PLACE ONE TABLET UNDER THE TONGUE EVERY 5 MINUTES AS NEEDED FOR CHESTPAIN DO NOT EXCEED 3 IN 24 HOURS 25 tablet 3  . Omega-3 Fatty Acids (FISH OIL) 1200 MG CAPS Take 1,200 mg by mouth at bedtime.     Marland Kitchen omeprazole (PRILOSEC) 20 MG capsule Take 2 capsules (40 mg total) by mouth daily. (Patient taking differently: Take 20 mg by mouth 2 (two) times daily before a meal. ) 90 capsule 3  .  simethicone (MYLICON) 80 MG chewable tablet Chew 1 tablet (80 mg total) by mouth every 6 (six) hours as needed for flatulence. 30 tablet 0   No current facility-administered medications on file prior to visit.      Objective:   Physical Exam Blood pressure (!) 150/80, pulse 72, height 5\' 4"  (1.626 m), weight 120 lb (54.4 kg). Alert and oriented. Skin warm and dry. Oral mucosa is moist.   . Sclera anicteric, conjunctivae is pink. Thyroid not enlarged. No cervical lymphadenopathy. Lungs clear. Heart regular rate and rhythm.  Abdomen is soft. Bowel sounds are positive. No hepatomegaly. No abdominal masses felt. No tenderness.  No edema to lower extremities.           Assessment & Plan:  GIB. She is doing well. She does have some confusion. She will have OV as needed.

## 2018-03-11 NOTE — Patient Instructions (Signed)
Return as needed

## 2018-03-23 DIAGNOSIS — M109 Gout, unspecified: Secondary | ICD-10-CM | POA: Diagnosis not present

## 2018-03-23 DIAGNOSIS — M79642 Pain in left hand: Secondary | ICD-10-CM | POA: Diagnosis not present

## 2018-03-23 DIAGNOSIS — Z1389 Encounter for screening for other disorder: Secondary | ICD-10-CM | POA: Diagnosis not present

## 2018-03-30 ENCOUNTER — Telehealth: Payer: Self-pay

## 2018-03-30 NOTE — Telephone Encounter (Signed)
LMOM advising that we are currently not seeing pt's in the office due to COVID-19 pandemic.  Advised that our office will be in touch to r/s NP appt.

## 2018-04-03 ENCOUNTER — Ambulatory Visit: Payer: Medicare Other | Admitting: Neurology

## 2018-04-10 DIAGNOSIS — N390 Urinary tract infection, site not specified: Secondary | ICD-10-CM | POA: Diagnosis not present

## 2018-04-23 ENCOUNTER — Other Ambulatory Visit: Payer: Self-pay

## 2018-04-27 ENCOUNTER — Telehealth (INDEPENDENT_AMBULATORY_CARE_PROVIDER_SITE_OTHER): Payer: Medicare Other | Admitting: Neurology

## 2018-04-27 ENCOUNTER — Encounter: Payer: Self-pay | Admitting: *Deleted

## 2018-04-27 ENCOUNTER — Encounter: Payer: Self-pay | Admitting: Neurology

## 2018-04-27 ENCOUNTER — Other Ambulatory Visit: Payer: Self-pay

## 2018-04-27 VITALS — Ht 63.5 in | Wt 120.0 lb

## 2018-04-27 DIAGNOSIS — F0280 Dementia in other diseases classified elsewhere without behavioral disturbance: Secondary | ICD-10-CM | POA: Diagnosis not present

## 2018-04-27 DIAGNOSIS — G309 Alzheimer's disease, unspecified: Secondary | ICD-10-CM | POA: Diagnosis not present

## 2018-04-27 NOTE — Progress Notes (Signed)
New Patient Virtual Visit via Video Note The purpose of this virtual visit is to provide medical care while limiting exposure to the novel coronavirus.    Consent was obtained for video visit:  Yes.   Answered questions that patient had about telehealth interaction:  Yes.   I discussed the limitations, risks, security and privacy concerns of performing an evaluation and management service by telemedicine. I also discussed with the patient that there may be a patient responsible charge related to this service. The patient expressed understanding and agreed to proceed.  Pt location: Home Physician Location: office Name of referring provider:  Sharilyn Sites, MD I connected with [name] at patients initiation/request on 04/27/2018 at  1:00 PM EDT by video enabled telemedicine application and verified that I am speaking with the correct person using two identifiers. Pt MRN:  [id number] Pt DOB:  1925-05-15 Video Participants:  [name];  [name] (daughter)    History of Present Illness: [name] is a 85 y.o. Caucasian female with CAD s/p CABG, history of stroke (1993), sinus bradycardia s/p PPM, COPD, depression, and hypothyroidism presenting for evaluation of dementia.  She lives at home in [place] home with her two grown sons.  She also has a caregiver that comes 5-days week 8 hours during the day and 5 hours at night.   Memory problems started gradually after having CABG in 2007.  Daughter recalls that patient had delirium while hospitalized.  Since this time, she had gradual decline in memory and over the past 5 years, greater decline independent functioning.  She needs assistance with bathing, dressing, and personal hygiene.  She is able to feed herself and has good appetite.  Occasionally, she has spells of anxiety, which is controlled with nonpharmacological therapies.  She does not describe hallucinations.  She is not getting lost within her home.  She does not have  any hobbies and sleeps most of the day and night.  She is predominantly wheelchair-bound due to overall debility.  Her daughter, who is power of attorney, is exploring memory care units for her mother, however, her brother is against his wishes, and therefore there is some internal psychosocial stress.  Part of her evaluation for memory care, included a neurological consultation, which is the reason for this visit.  She has been on Aricept 10 mg daily and Namenda 5 mg twice daily.  She had CT hand which was personally reviewed in November 2019 which shows generalized and moderate to severe atrophy and right parietal encephalomalacia from old infarct.  Her husband passed away at the age of 83 in November 2019, she has been adjusting to this transition well.  Out-side paper records, electronic medical record, and images have been reviewed where available and summarized as:  CT head 11/14/2017: 1. No CT evidence for acute intracranial abnormality. 2. Atrophy and small vessel ischemic changes of the white matter.  Remote right parietal infarct.  No results found for: HGBA1C Lab Results  Component Value Date   [id number] 742 (H) 04/06/2015   Lab Results  Component Value Date   TSH 3.297 04/11/2015   No results found for: ESRSEDRATE, POCTSEDRATE  Past Medical History:  Diagnosis Date  . Anemia   . Arthritis    "fingers; back" (02/09/2013)  . Asthma   . CAD (coronary artery disease)    CABG surgery in 06/2005 for left main disease; normal EF; negative stress nuclear 08/2008  . Cerebrovascular disease    1993-CVA; 08/2008-mild atherosclerosis  without focal stenosis  . Chronic back pain greater than 3 months duration    "nerve damage down into left leg" (02/09/2013)  . Chronic bronchitis ([place])    "haven't had it in last 2-3 years; used to get it q yr" (02/09/2013)  . COPD (chronic obstructive pulmonary disease) ([place])   . Dementia ([place])    "more than mild; having more short term memory loss recently;  on Aricept" (02/09/2013)  . Diverticulosis   . Dizziness    chronic  . Family history of anesthesia complication    "makes daughter sick" (02/09/2013)  . Frequent PVCs   . Frequent UTI   . GERD (gastroesophageal reflux disease)   . QMVHQION(629.5)    "sometimes weekly" (02/09/2013)  . High cholesterol   . Hypertension   . Mental disorder    mild dementia  . Orthostatic hypotension   . Osteopenia   . Pneumonia    "couple times; several years ago" (02/09/2013)  . Sinus bradycardia    s/p STJ Assurity dual chamber pacemaker by Dr Rayann Heman 02/2013  . Stroke ([place])    20-30 yrs ago  . Syncope     Negative even recorder dated 3/08  . Tobacco abuse    60 pack years; continuing    Past Surgical History:  Procedure Laterality Date  . ABDOMINAL HYSTERECTOMY  1990  . APPENDECTOMY    . CARDIAC CATHETERIZATION  2007  . CATARACT EXTRACTION, BILATERAL    . COLONOSCOPY  10/10/2011   Procedure: COLONOSCOPY;  Surgeon: Rogene Houston, MD;  [facility]: [facility];  Service: Endoscopy;  Laterality: N/A;  1230  . COLONOSCOPY N/A 04/12/2015   Procedure: COLONOSCOPY;  Surgeon: Rogene Houston, MD;  [facility]: [facility];  Service: Endoscopy;  Laterality: N/A;  . CORONARY ARTERY BYPASS GRAFT  2007   Left main disease  . ESOPHAGOGASTRODUODENOSCOPY  10/10/2011   Procedure: ESOPHAGOGASTRODUODENOSCOPY (EGD);  Surgeon: Rogene Houston, MD;  [facility]: [facility];  Service: Endoscopy;  Laterality: N/A;  . ESOPHAGOGASTRODUODENOSCOPY N/A 04/08/2015   Procedure: ESOPHAGOGASTRODUODENOSCOPY (EGD);  Surgeon: Rogene Houston, MD;  [facility]: [facility];  Service: Endoscopy;  Laterality: N/A;  . HERNIA REPAIR    . PACEMAKER INSERTION  02/09/2013   STJ Assurity pacemaker implanted by Dr Rayann Heman for symptomatic bradycardia  . PERMANENT PACEMAKER INSERTION N/A 02/09/2013   Procedure: PERMANENT PACEMAKER INSERTION;  Surgeon: Coralyn Mark, MD;  Location: [facility];  Service: Cardiovascular;  Laterality: N/A;  .  TONSILLECTOMY    . VENTRAL HERNIA REPAIR       Medications:  Outpatient Encounter Medications as of 04/27/2018  Medication Sig Note  . Acetaminophen 500 MG coapsule Take 650 mg by mouth every 6 (six) hours as needed for mild pain. For pain 11/14/2017: Takes as needed for general pain.   . busPIRone (BUSPAR) 5 MG tablet Take 5 mg by mouth 2 (two) times daily.    . colchicine 0.6 MG tablet    . Cyanocobalamin (VITAMIN B-12 CR PO) Take 1 tablet by mouth 2 (two) times daily.   Marland Kitchen donepezil (ARICEPT) 10 MG tablet Take 10 mg by mouth daily.  09/20/2013: .  . levocetirizine (XYZAL) 5 MG tablet    . levothyroxine (SYNTHROID, LEVOTHROID) 50 MCG tablet Take 1 tablet by mouth daily. 11/14/2017: LF 08/2017 30DS  . memantine (NAMENDA) 5 MG tablet Take 1 tablet by mouth 2 (two) times daily.   . nitroGLYCERIN (NITROSTAT) 0.4 MG SL tablet PLACE ONE TABLET UNDER THE TONGUE  EVERY 5 MINUTES AS NEEDED FOR CHESTPAIN DO NOT EXCEED 3 IN 24 HOURS   . omeprazole (PRILOSEC) 20 MG capsule Take 2 capsules (40 mg total) by mouth daily. (Patient taking differently: Take 20 mg by mouth 2 (two) times daily before a meal. )   . [DISCONTINUED] Calcium-Vitamin D (CALTRATE 600 PLUS-VIT D PO) Take 1 tablet by mouth at bedtime.    . [DISCONTINUED] ferrous sulfate 325 (65 FE) MG tablet Take 1 tablet (325 mg total) by mouth 2 (two) times daily with a meal. (Patient taking differently: Take 325 mg by mouth daily. )   . [DISCONTINUED] nitrofurantoin (MACRODANTIN) 100 MG capsule Take 100 mg by mouth 2 (two) times daily.   . [DISCONTINUED] Omega-3 Fatty Acids (FISH OIL) 1200 MG CAPS Take 1,200 mg by mouth at bedtime.    . [DISCONTINUED] simethicone (MYLICON) 80 MG chewable tablet Chew 1 tablet (80 mg total) by mouth every 6 (six) hours as needed for flatulence.    No facility-administered encounter medications on file as of 04/27/2018.     Allergies:  Allergies  Allergen Reactions  . Aleve [Naproxen Sodium]   . Ativan [Lorazepam]      Aggressive, hallucination  . Cephalexin Hives  . Contrast Media [Iodinated Diagnostic Agents]     Unknown  . Famvir [Famciclovir]   . Haldol [Haloperidol Lactate]     Aggressive, hallucination  . Iohexol     Urticaria, emesis, numbness during IVP performed at [facility] years ago  . Ketek [Telithromycin]   . Penicillins Hives    Has patient had a PCN reaction causing immediate rash, facial/tongue/throat swelling, SOB or lightheadedness with hypotension: Yes Has patient had a PCN reaction causing severe rash involving mucus membranes or skin necrosis: No Has patient had a PCN reaction that required hospitalization NoNo Has patient had a PCN reaction occurring within the last 10 years: No If all of the above answers are "NO", then may proceed with Cephalosporin use.   . Shellfish Allergy Other (See Comments)    Unknown  . Sulfa Antibiotics Hives    Family History: Family History  Problem Relation Age of Onset  . Angina Mother   . Heart attack Mother   . Heart disease Other   . Arthritis Other   . Lung disease Other   . Asthma Other     Social History: Social History   Tobacco Use  . Smoking status: Former Smoker    Packs/day: 0.50    Years: 60.00    Pack years: 30.00    Types: Cigarettes    Start date: 12/12/1952    Last attempt to quit: 10/20/2006    Years since quitting: 11.5  . Smokeless tobacco: Never Used  . Tobacco comment: 02/09/2013 "quit smoking in ~ 2009"  Substance Use Topics  . Alcohol use: No    Alcohol/week: 0.0 standard drinks  . Drug use: No   Social History   Social History Narrative   3 children    Right handed   HS    Drinks decaff    Review of Systems:  CONSTITUTIONAL: No fevers, chills, night sweats, or weight loss.   EYES: No visual changes or eye pain ENT: No hearing changes.  No history of nose bleeds.   RESPIRATORY: No cough, wheezing and shortness of breath.   CARDIOVASCULAR: Negative for chest pain, and palpitations.    GI: Negative for abdominal discomfort, blood in stools or black stools.  No recent change in bowel habits.  GU:  No history of incontinence.   MUSCLOSKELETAL: No history of joint pain or swelling.  No myalgias.   SKIN: Negative for lesions, rash, and itching.   HEMATOLOGY/ONCOLOGY: Negative for prolonged bleeding, bruising easily, and swollen nodes.  No history of cancer. ENDOCRINE: Negative for cold or heat intolerance, polydipsia or goiter.   PSYCH:  +depression or anxiety symptoms.   NEURO: As Above.    General Medical Exam: Very tired appearing, often falling asleep throughout the visit.  She appears comfortable.  Breathing is nonlabored.    Neurological Exam: MENTAL STATUS:  Year is 29, month is September, season is winter.  MMSE 5/30She cannot correctly identify her daughter.  She is able to perform 2 step commands (I.e. show thumb on left hand, show 2 fingers on right hand), close eyes, tap fingers.  Paucity speech, she does not engage in conversation.  CRANIAL NERVES:  Normal conjugate, extra-ocular eye movements in all directions of gaze.  Mild bilateral ptosis.  Normal facial symmetry and movements.  Normal shoulder shrug and head rotation.    MOTOR:  Antigravity in all extremities.  No abnormal movements.  No pronator drift.   COORDINATION/GAIT: Normal finger to nose bilaterally.  Intact rapid alternating movements bilaterally.  Gait not tested   IMPRESSION: Advanced Alzheimer's dementia.  She is already on optimal medical therapy with Aricept 10 mg daily and Namenda 5 mg twice daily.  I do not think increasing the dose of Aricept will be effective, given her advanced disease.  At this point, management is supportive and ensuring her needs are met at home safely.  She is appropriate for a memory care unit, if this cannot be accommodated at home.  Significant portion of today's visit was spent offering social support to daughter and I discussed Alzheimer's Association website for  resources for caregivers. If there are any new neurological concerns, daughter will contact my office for follow-up visit.  Follow Up Instructions:  I discussed the assessment and treatment plan with the patient. The patient was provided an opportunity to ask questions and all were answered. The patient agreed with the plan and demonstrated an understanding of the instructions.   The patient was advised to call back or seek an in-person evaluation if the symptoms worsen or if the condition fails to improve as anticipated.  Total Time spent:  45 min   Alda Berthold, DO

## 2018-04-30 NOTE — Progress Notes (Signed)
Addendum  Labs rec'd from [facility] dated 06/17/2017: Hemoglobin A1c 5.1, TSH 4.7, vitamin B12 995, sodium 142, potassium 3.5, chloride 102, calcium 8.9, creatinine 0.6, AST 16, ALT 8

## 2018-05-04 ENCOUNTER — Ambulatory Visit (INDEPENDENT_AMBULATORY_CARE_PROVIDER_SITE_OTHER): Payer: Medicare Other | Admitting: *Deleted

## 2018-05-04 ENCOUNTER — Other Ambulatory Visit: Payer: Self-pay

## 2018-05-04 DIAGNOSIS — I495 Sick sinus syndrome: Secondary | ICD-10-CM

## 2018-05-05 ENCOUNTER — Telehealth: Payer: Self-pay

## 2018-05-05 NOTE — Telephone Encounter (Signed)
Left message for patient to remind of missed remote transmission.  

## 2018-05-11 LAB — CUP PACEART REMOTE DEVICE CHECK
Date Time Interrogation Session: 20200504125233
Implantable Lead Implant Date: 20150203
Implantable Lead Implant Date: 20150203
Implantable Lead Location: 753859
Implantable Lead Location: 753860
Implantable Lead Model: 1948
Implantable Pulse Generator Implant Date: 20150203
Pulse Gen Model: 2240
Pulse Gen Serial Number: 7586568

## 2018-05-11 NOTE — Progress Notes (Signed)
Remote pacemaker transmission.   

## 2018-05-12 ENCOUNTER — Emergency Department (HOSPITAL_COMMUNITY): Payer: Medicare Other

## 2018-05-12 ENCOUNTER — Other Ambulatory Visit: Payer: Self-pay

## 2018-05-12 ENCOUNTER — Encounter (HOSPITAL_COMMUNITY): Payer: Self-pay

## 2018-05-12 ENCOUNTER — Emergency Department (HOSPITAL_COMMUNITY)
Admission: EM | Admit: 2018-05-12 | Discharge: 2018-05-12 | Disposition: A | Discharge status: Home / Self Care | Payer: Medicare Other | Attending: Emergency Medicine | Admitting: Emergency Medicine

## 2018-05-12 DIAGNOSIS — I2510 Atherosclerotic heart disease of native coronary artery without angina pectoris: Secondary | ICD-10-CM | POA: Diagnosis not present

## 2018-05-12 DIAGNOSIS — Z950 Presence of cardiac pacemaker: Secondary | ICD-10-CM | POA: Diagnosis not present

## 2018-05-12 DIAGNOSIS — I82412 Acute embolism and thrombosis of left femoral vein: Secondary | ICD-10-CM | POA: Diagnosis not present

## 2018-05-12 DIAGNOSIS — F0280 Dementia in other diseases classified elsewhere without behavioral disturbance: Secondary | ICD-10-CM | POA: Insufficient documentation

## 2018-05-12 DIAGNOSIS — G309 Alzheimer's disease, unspecified: Secondary | ICD-10-CM | POA: Diagnosis not present

## 2018-05-12 DIAGNOSIS — M7989 Other specified soft tissue disorders: Secondary | ICD-10-CM | POA: Diagnosis not present

## 2018-05-12 DIAGNOSIS — Z951 Presence of aortocoronary bypass graft: Secondary | ICD-10-CM | POA: Diagnosis not present

## 2018-05-12 DIAGNOSIS — I10 Essential (primary) hypertension: Secondary | ICD-10-CM | POA: Insufficient documentation

## 2018-05-12 DIAGNOSIS — Z8673 Personal history of transient ischemic attack (TIA), and cerebral infarction without residual deficits: Secondary | ICD-10-CM | POA: Diagnosis not present

## 2018-05-12 DIAGNOSIS — Z79899 Other long term (current) drug therapy: Secondary | ICD-10-CM | POA: Diagnosis not present

## 2018-05-12 DIAGNOSIS — R600 Localized edema: Secondary | ICD-10-CM | POA: Diagnosis not present

## 2018-05-12 DIAGNOSIS — I824Y1 Acute embolism and thrombosis of unspecified deep veins of right proximal lower extremity: Secondary | ICD-10-CM | POA: Diagnosis not present

## 2018-05-12 DIAGNOSIS — F17210 Nicotine dependence, cigarettes, uncomplicated: Secondary | ICD-10-CM | POA: Diagnosis not present

## 2018-05-12 DIAGNOSIS — J449 Chronic obstructive pulmonary disease, unspecified: Secondary | ICD-10-CM | POA: Insufficient documentation

## 2018-05-12 DIAGNOSIS — R2241 Localized swelling, mass and lump, right lower limb: Secondary | ICD-10-CM | POA: Diagnosis present

## 2018-05-12 DIAGNOSIS — I82401 Acute embolism and thrombosis of unspecified deep veins of right lower extremity: Secondary | ICD-10-CM | POA: Diagnosis not present

## 2018-05-12 MED ORDER — RIVAROXABAN 15 MG PO TABS
15.0000 mg | ORAL_TABLET | Freq: Once | ORAL | Status: AC
  Administered 2018-05-12: 15 mg via ORAL
  Filled 2018-05-12 (×2): qty 1

## 2018-05-12 MED ORDER — RIVAROXABAN (XARELTO) VTE STARTER PACK (15 & 20 MG): ORAL_TABLET | ORAL | 0 refills | Status: DC

## 2018-05-12 NOTE — Discharge Instructions (Signed)
Elevation and warm heating pad to the leg several times daily can also help with symptoms.  Take your next dose of the Xarelto tomorrow morning.  Call your doctor for a recheck of your symptoms as discussed.  Use caution when on this medicine to avoid risk of falls as you do have increased risk of significant bleeding with injuries.

## 2018-05-12 NOTE — ED Provider Notes (Addendum)
[facility] EMERGENCY DEPARTMENT Provider Note   CSN: [id number] Arrival date & time: 05/12/18  1646    History   Chief Complaint Chief Complaint  Patient presents with  . Foot Swelling    HPI [name] is a 85 y.o. female with a history of anemia, asthma, CAD, CVA, COPD, HTN, osteopenia and advanced alzheimers, living in her children's home, presenting with a 2 day history of right foot swelling.  Daughter present states she fell out of bed several nights ago and is unsure of foot injury during this event.  The patient has no complaint of pain in the extremity.  Level 5 caveat due to dementia.     The history is provided by a relative. The history is limited by the condition of the patient (dementia).    Past Medical History:  Diagnosis Date  . Anemia   . Arthritis    "fingers; back" (02/09/2013)  . Asthma   . CAD (coronary artery disease)    CABG surgery in 06/2005 for left main disease; normal EF; negative stress nuclear 08/2008  . Cerebrovascular disease    1993-CVA; 08/2008-mild atherosclerosis without focal stenosis  . Chronic back pain greater than 3 months duration    "nerve damage down into left leg" (02/09/2013)  . Chronic bronchitis ([place])    "haven't had it in last 2-3 years; used to get it q yr" (02/09/2013)  . COPD (chronic obstructive pulmonary disease) ([place])   . Dementia ([place])    "more than mild; having more short term memory loss recently; on Aricept" (02/09/2013)  . Diverticulosis   . Dizziness    chronic  . Family history of anesthesia complication    "makes daughter sick" (02/09/2013)  . Frequent PVCs   . Frequent UTI   . GERD (gastroesophageal reflux disease)   . PJKDTOIZ(124.5)    "sometimes weekly" (02/09/2013)  . High cholesterol   . Hypertension   . Mental disorder    mild dementia  . Orthostatic hypotension   . Osteopenia   . Pneumonia    "couple times; several years ago" (02/09/2013)  . Sinus bradycardia    s/p STJ Assurity dual chamber  pacemaker by Dr Rayann Heman 02/2013  . Stroke ([place])    20-30 yrs ago  . Syncope     Negative even recorder dated 3/08  . Tobacco abuse    60 pack years; continuing    Patient Active Problem List   Diagnosis Date Noted  . Acute blood loss anemia 04/11/2015  . Chronic chest pain 04/11/2015  . Dementia ([place]) 04/11/2015  . Lower GI bleed 04/06/2015  . Pacemaker 05/27/2014  . Balance disorder 10/05/2013  . Dizziness of unknown cause 10/05/2013  . Difficulty in walking(719.7) 10/05/2013  . Muscle weakness (generalized) 10/05/2013  . Hypokalemia 09/21/2013  . Acute bronchitis 09/20/2013  . SIRS (systemic inflammatory response syndrome) ([place]) 09/20/2013  . Atypical chest pain 09/20/2013  . UTI (lower urinary tract infection) 09/20/2013  . Dyspnea 09/20/2013  . Anemia 02/09/2013  . Sick sinus syndrome ([place]) 01/27/2013  . Premature ventricular contraction 01/27/2013  . CAD (coronary artery disease) 01/27/2013  . Essential hypertension 01/27/2013  . Hx of falling, presenting hazards to health 12/21/2012  . Laboratory test 05/09/2011  . Arteriosclerotic cardiovascular disease (ASCVD)   . Cerebrovascular disease   . Syncope   . Hyperlipidemia 05/07/2009  . Tobacco abuse 05/07/2009  . Orthostatic hypotension 05/07/2009  . GERD (gastroesophageal reflux disease) 09/19/2008  . Osteopenia 09/19/2008    Past  Surgical History:  Procedure Laterality Date  . ABDOMINAL HYSTERECTOMY  1990  . APPENDECTOMY    . CARDIAC CATHETERIZATION  2007  . CATARACT EXTRACTION, BILATERAL    . COLONOSCOPY  10/10/2011   Procedure: COLONOSCOPY;  Surgeon: Rogene Houston, MD;  [facility];  Service: Endoscopy;  Laterality: N/A;  1230  . COLONOSCOPY N/A 04/12/2015   Procedure: COLONOSCOPY;  Surgeon: Rogene Houston, MD;  Location: [facility];  Service: Endoscopy;  Laterality: N/A;  . CORONARY ARTERY BYPASS GRAFT  2007   Left main disease  . ESOPHAGOGASTRODUODENOSCOPY  10/10/2011   Procedure:  ESOPHAGOGASTRODUODENOSCOPY (EGD);  Surgeon: Rogene Houston, MD;  Location: [facility];  Service: Endoscopy;  Laterality: N/A;  . ESOPHAGOGASTRODUODENOSCOPY N/A 04/08/2015   Procedure: ESOPHAGOGASTRODUODENOSCOPY (EGD);  Surgeon: Rogene Houston, MD;  Location: [facility];  Service: Endoscopy;  Laterality: N/A;  . HERNIA REPAIR    . PACEMAKER INSERTION  02/09/2013   STJ Assurity pacemaker implanted by Dr Rayann Heman for symptomatic bradycardia  . PERMANENT PACEMAKER INSERTION N/A 02/09/2013   Procedure: PERMANENT PACEMAKER INSERTION;  Surgeon: Coralyn Mark, MD;  Location: [facility] CATH LAB;  Service: Cardiovascular;  Laterality: N/A;  . TONSILLECTOMY    . VENTRAL HERNIA REPAIR       OB History    Gravida  3   Para  3   Term  3   Preterm      AB      Living        SAB      TAB      Ectopic      Multiple      Live Births               Home Medications    Prior to Admission medications   Medication Sig Start Date End Date Taking? Authorizing Provider  acetaminophen (TYLENOL) 325 MG tablet Take 650 mg by mouth every 6 (six) hours as needed for pain. For pain   Yes [provider]  busPIRone (BUSPAR) 5 MG tablet Take 5 mg by mouth 2 (two) times daily.  04/14/15  Yes [provider]  colchicine 0.6 MG tablet Take 0.6 mg by mouth daily as needed (for gout pain).  03/23/18  Yes [provider]  Cyanocobalamin (VITAMIN B-12 CR PO) Take 1 tablet by mouth every evening.    Yes [provider]  donepezil (ARICEPT) 10 MG tablet Take 10 mg by mouth at bedtime.    Yes [provider]  levocetirizine (XYZAL) 5 MG tablet Take 5 mg by mouth every morning.  04/24/18  Yes [provider]  levothyroxine (SYNTHROID, LEVOTHROID) 50 MCG tablet Take 50 mcg by mouth every morning.  08/12/17  Yes [provider]  memantine (NAMENDA) 5 MG tablet Take 5 mg by mouth 2 (two) times daily.  10/09/17  Yes [provider]  Multiple  Vitamins-Minerals (PRESERVISION AREDS 2+MULTI VIT) CAPS Take 1 capsule by mouth 2 (two) times a day.   Yes [provider]  nitroGLYCERIN (NITROSTAT) 0.4 MG SL tablet PLACE ONE TABLET UNDER THE TONGUE EVERY 5 MINUTES AS NEEDED FOR CHESTPAIN DO NOT EXCEED 3 IN 24 HOURS Patient taking differently: Place 0.4 mg under the tongue every 5 (five) minutes as needed for chest pain.  04/09/17  Yes Herminio Commons, MD  omeprazole (PRILOSEC) 20 MG capsule Take 2 capsules (40 mg total) by mouth daily. Patient taking differently: Take 20 mg by mouth every morning.  04/09/15  Yes Regalado, [name], MD  Rivaroxaban 15 & 20 MG TBPK Take as directed on package: Start with one 15mg  tablet by mouth twice a day with food. On Day 22, switch to one 20mg  tablet once a day with food. 05/12/18   Evalee Jefferson, PA-C    Family History Family History  Problem Relation Age of Onset  . Angina Mother   . Heart attack Mother   . Heart disease Other   . Arthritis Other   . Lung disease Other   . Asthma Other     Social History Social History   Tobacco Use  . Smoking status: Former Smoker    Packs/day: 0.50    Years: 60.00    Pack years: 30.00    Types: Cigarettes    Start date: [birth year]/12/1950    Last attempt to quit: 10/20/2006    Years since quitting: 11.5  . Smokeless tobacco: Never Used  . Tobacco comment: 02/09/2013 "quit smoking in ~ 2009"  Substance Use Topics  . Alcohol use: No    Alcohol/week: 0.0 standard drinks  . Drug use: No     Allergies   Aleve [naproxen sodium]; Ativan [lorazepam]; Cephalexin; Contrast media [iodinated diagnostic agents]; Famvir [famciclovir]; Haldol [haloperidol lactate]; Iohexol; Ketek [telithromycin]; Penicillins; Shellfish allergy; and Sulfa antibiotics   Review of Systems Review of Systems  Unable to perform ROS: Dementia  Cardiovascular: Positive for leg swelling.     Physical Exam Updated Vital Signs BP (!) 199/96   Pulse 64   Temp (!) 97.5 F (36.4 C)  (Oral)   Resp 18   Ht 5\' 4"  (1.626 m)   Wt 54.9 kg   SpO2 99%   BMI 20.77 kg/m   Physical Exam Vitals signs and nursing note reviewed.  Constitutional:      Appearance: She is well-developed.     Comments: Pleasantly confused.  HENT:     Head: Normocephalic and atraumatic.  Eyes:     Conjunctiva/sclera: Conjunctivae normal.  Neck:     Musculoskeletal: Normal range of motion.  Cardiovascular:     Rate and Rhythm: Normal rate and regular rhythm.     Heart sounds: Normal heart sounds.  Pulmonary:     Effort: Pulmonary effort is normal.     Breath sounds: Normal breath sounds. No wheezing.  Abdominal:     General: Bowel sounds are normal.     Palpations: Abdomen is soft.     Tenderness: There is no abdominal tenderness.  Musculoskeletal: Normal range of motion.     Right lower leg: Edema present.     Comments: Pitting edema right lower leg only to upper tibia, involving foot.  Foot is cool with less than 3 sec cap refill.  Dorsalis pedal pulse located using handheld doppler.   Skin:    General: Skin is warm and dry.     Findings: No erythema.  Neurological:     Mental Status: She is alert.      ED Treatments / Results  Labs (all labs ordered are listed, but only abnormal results are displayed) Labs Reviewed - No data to display  EKG None  Radiology Dg Tibia/fibula Right  Result Date: 05/12/2018 CLINICAL DATA:  Right foot swelling.  Recent fall. EXAM: RIGHT TIBIA AND FIBULA - 2 VIEW COMPARISON:  None. FINDINGS: Mild spurring of the tibial spine and marginal spurring in the lateral compartment of the knee. Abnormal soft tissue swelling overlying the lateral malleolus and to a lesser extent the medial  malleolus, without underlying malleolar fracture identified. Subcutaneous edema along the calf, although less striking/confluent than that at the malleoli. Mild marginal spurring of the patella. No knee joint effusion identified. No discrete fracture of the tibia or fibula  identified. IMPRESSION: 1. Subcutaneous edema in the calf with more confluent subcutaneous edema overlying the malleoli, lateral greater than medial. No appreciable underlying fracture. Electronically Signed   By: Van Clines M.D.   On: 05/12/2018 18:33   US Venous Img Lower Right (dvt Study)  Result Date: 05/12/2018 CLINICAL DATA:  Left foot and leg edema EXAM: LEFT LOWER EXTREMITY VENOUS DOPPLER ULTRASOUND TECHNIQUE: Gray-scale sonography with graded compression, as well as color Doppler and duplex ultrasound were performed to evaluate the lower extremity deep venous systems from the level of the common femoral vein and including the common femoral, femoral, profunda femoral, popliteal and calf veins including the posterior tibial, peroneal and gastrocnemius veins when visible. The superficial great saphenous vein was also interrogated. Spectral Doppler was utilized to evaluate flow at rest and with distal augmentation maneuvers in the common femoral, femoral and popliteal veins. COMPARISON:  None. FINDINGS: Contralateral Common Femoral Vein: Respiratory phasicity is normal and symmetric with the symptomatic side. No evidence of thrombus. Normal compressibility. Common Femoral Vein: No evidence of thrombus. Normal compressibility, respiratory phasicity and response to augmentation. Saphenofemoral Junction: No evidence of thrombus. Normal compressibility and flow on color Doppler imaging. Profunda Femoral Vein: No evidence of thrombus. Normal compressibility and flow on color Doppler imaging. Femoral Vein: Occlusive deep venous thrombosis without compressibility or phasicity. Popliteal Vein: Occlusive deep venous thrombosis without compressibility or phasicity. Calf Veins: Occlusive deep venous thrombosis without compressibility or phasicity. Superficial Great Saphenous Vein: No evidence of thrombus. Normal compressibility. Venous Reflux:  None. Other Findings:  None. IMPRESSION: Occlusive deep venous  thrombosis involving the femoral vein, popliteal vein and the calf veins. Electronically Signed   By: Kathreen Devoid   On: 05/12/2018 18:13   Dg Foot Complete Right  Result Date: 05/12/2018 CLINICAL DATA:  Right foot swelling starting on Sunday. Recent fall. EXAM: RIGHT FOOT COMPLETE - 3+ VIEW COMPARISON:  None. FINDINGS: Diffuse bony demineralization. Small plantar calcaneal spur. Dorsal subcutaneous edema along the forefoot. Degenerative articular space narrowing at the first metatarsophalangeal articulation. IMPRESSION: 1. No fracture is identified. There is dorsal subcutaneous edema in the forefoot. 2. Diffuse bony demineralization. 3. Degenerative articular space narrowing at the first MTP joint. 4. Small plantar calcaneal spur. Electronically Signed   By: Van Clines M.D.   On: 05/12/2018 18:30    Procedures Procedures (including critical care time)  Medications Ordered in ED Medications  Rivaroxaban (XARELTO) tablet 15 mg (has no administration in time range)     Initial Impression / Assessment and Plan / ED Course  I have reviewed the triage vital signs and the nursing notes.  Pertinent labs & imaging results that were available during my care of the patient were reviewed by me and considered in my medical decision making (see chart for details).        7:18 PM Dg Vivi Martens now at bedside, results of imaging reviewed and discussed with extensive right dvt.  Options reviewed including no tx vs anticoagulation.  Risks and benefits discussed, including increased risk of bleeding with injuries/falls.  Pt lives with her 2 sons and also has home health assistance for her.  Risk of no tx include worsened dvt/ PE.  Dg opts for starting medication - first dose of Xarelto given along with  prescription for the starter pack.  Pt originally had appt with her pcp tomorrow due to this same complaint. Advised to call in am to inform of this dvt - recheck early next week may be more  appropriate.  Discussed elevation, warm compresses or heat pad.  Return precautions including sob, cp.   Of note, no hx of PUD, GERD only.    Final Clinical Impressions(s) / ED Diagnoses   Final diagnoses:  Acute deep vein thrombosis (DVT) of proximal vein of right lower extremity [facility])    ED Discharge Orders         Ordered    Rivaroxaban 15 & 20 MG TBPK     05/12/18 1850           Landis Martins 05/12/18 1924    Evalee Jefferson, PA-C 05/12/18 1925    Isla Pence, MD 05/12/18 1934

## 2018-05-12 NOTE — ED Triage Notes (Signed)
Pt presents to ED with complaints of right foot swelling since Sunday. Daughter states they noticed it on Sunday. Pt fell out of bed the other night but per daughter unsure if she hurt her foot then or not. Pt with hx of dementia and denies pain at this time.

## 2018-05-12 NOTE — ED Notes (Signed)
Pt wheeled to waiting room with daughter. Pt verbalized understanding of discharge instructions.

## 2018-05-21 DIAGNOSIS — E782 Mixed hyperlipidemia: Secondary | ICD-10-CM | POA: Diagnosis not present

## 2018-05-21 DIAGNOSIS — Z6820 Body mass index (BMI) 20.0-20.9, adult: Secondary | ICD-10-CM | POA: Diagnosis not present

## 2018-05-21 DIAGNOSIS — M5136 Other intervertebral disc degeneration, lumbar region: Secondary | ICD-10-CM | POA: Diagnosis not present

## 2018-05-21 DIAGNOSIS — E538 Deficiency of other specified B group vitamins: Secondary | ICD-10-CM | POA: Diagnosis not present

## 2018-05-21 DIAGNOSIS — Z1389 Encounter for screening for other disorder: Secondary | ICD-10-CM | POA: Diagnosis not present

## 2018-05-21 DIAGNOSIS — M10032 Idiopathic gout, left wrist: Secondary | ICD-10-CM | POA: Diagnosis not present

## 2018-05-21 DIAGNOSIS — Z0001 Encounter for general adult medical examination with abnormal findings: Secondary | ICD-10-CM | POA: Diagnosis not present

## 2018-05-25 ENCOUNTER — Other Ambulatory Visit: Payer: Self-pay

## 2018-05-25 ENCOUNTER — Emergency Department (HOSPITAL_COMMUNITY): Payer: Medicare Other

## 2018-05-25 ENCOUNTER — Encounter (HOSPITAL_COMMUNITY): Payer: Self-pay | Admitting: Emergency Medicine

## 2018-05-25 ENCOUNTER — Observation Stay (HOSPITAL_COMMUNITY)
Admission: EM | Admit: 2018-05-25 | Discharge: 2018-05-28 | Disposition: A | Discharge status: Home Health | Payer: Medicare Other | Attending: Internal Medicine | Admitting: Internal Medicine

## 2018-05-25 DIAGNOSIS — F0390 Unspecified dementia without behavioral disturbance: Secondary | ICD-10-CM | POA: Diagnosis not present

## 2018-05-25 DIAGNOSIS — Z888 Allergy status to other drugs, medicaments and biological substances status: Secondary | ICD-10-CM | POA: Diagnosis not present

## 2018-05-25 DIAGNOSIS — Z886 Allergy status to analgesic agent status: Secondary | ICD-10-CM | POA: Insufficient documentation

## 2018-05-25 DIAGNOSIS — I2510 Atherosclerotic heart disease of native coronary artery without angina pectoris: Secondary | ICD-10-CM | POA: Diagnosis not present

## 2018-05-25 DIAGNOSIS — R2689 Other abnormalities of gait and mobility: Secondary | ICD-10-CM | POA: Insufficient documentation

## 2018-05-25 DIAGNOSIS — M6281 Muscle weakness (generalized): Secondary | ICD-10-CM | POA: Insufficient documentation

## 2018-05-25 DIAGNOSIS — Z7901 Long term (current) use of anticoagulants: Secondary | ICD-10-CM | POA: Diagnosis not present

## 2018-05-25 DIAGNOSIS — K219 Gastro-esophageal reflux disease without esophagitis: Secondary | ICD-10-CM | POA: Diagnosis present

## 2018-05-25 DIAGNOSIS — K922 Gastrointestinal hemorrhage, unspecified: Principal | ICD-10-CM | POA: Insufficient documentation

## 2018-05-25 DIAGNOSIS — R079 Chest pain, unspecified: Secondary | ICD-10-CM | POA: Diagnosis not present

## 2018-05-25 DIAGNOSIS — Z880 Allergy status to penicillin: Secondary | ICD-10-CM | POA: Insufficient documentation

## 2018-05-25 DIAGNOSIS — Z8673 Personal history of transient ischemic attack (TIA), and cerebral infarction without residual deficits: Secondary | ICD-10-CM | POA: Diagnosis not present

## 2018-05-25 DIAGNOSIS — Z1159 Encounter for screening for other viral diseases: Secondary | ICD-10-CM | POA: Insufficient documentation

## 2018-05-25 DIAGNOSIS — R2681 Unsteadiness on feet: Secondary | ICD-10-CM | POA: Diagnosis not present

## 2018-05-25 DIAGNOSIS — S0990XA Unspecified injury of head, initial encounter: Secondary | ICD-10-CM | POA: Diagnosis not present

## 2018-05-25 DIAGNOSIS — I10 Essential (primary) hypertension: Secondary | ICD-10-CM | POA: Diagnosis not present

## 2018-05-25 DIAGNOSIS — Z950 Presence of cardiac pacemaker: Secondary | ICD-10-CM | POA: Diagnosis not present

## 2018-05-25 DIAGNOSIS — Z951 Presence of aortocoronary bypass graft: Secondary | ICD-10-CM | POA: Diagnosis not present

## 2018-05-25 DIAGNOSIS — Z66 Do not resuscitate: Secondary | ICD-10-CM | POA: Insufficient documentation

## 2018-05-25 DIAGNOSIS — Z720 Tobacco use: Secondary | ICD-10-CM | POA: Diagnosis present

## 2018-05-25 DIAGNOSIS — Z87891 Personal history of nicotine dependence: Secondary | ICD-10-CM | POA: Diagnosis not present

## 2018-05-25 DIAGNOSIS — D62 Acute posthemorrhagic anemia: Secondary | ICD-10-CM | POA: Diagnosis not present

## 2018-05-25 DIAGNOSIS — I2699 Other pulmonary embolism without acute cor pulmonale: Secondary | ICD-10-CM

## 2018-05-25 DIAGNOSIS — E039 Hypothyroidism, unspecified: Secondary | ICD-10-CM | POA: Insufficient documentation

## 2018-05-25 DIAGNOSIS — Z882 Allergy status to sulfonamides status: Secondary | ICD-10-CM | POA: Diagnosis not present

## 2018-05-25 DIAGNOSIS — Z86718 Personal history of other venous thrombosis and embolism: Secondary | ICD-10-CM | POA: Diagnosis not present

## 2018-05-25 DIAGNOSIS — Z881 Allergy status to other antibiotic agents status: Secondary | ICD-10-CM | POA: Insufficient documentation

## 2018-05-25 DIAGNOSIS — E785 Hyperlipidemia, unspecified: Secondary | ICD-10-CM | POA: Insufficient documentation

## 2018-05-25 DIAGNOSIS — I2609 Other pulmonary embolism with acute cor pulmonale: Secondary | ICD-10-CM

## 2018-05-25 DIAGNOSIS — D649 Anemia, unspecified: Secondary | ICD-10-CM | POA: Diagnosis present

## 2018-05-25 DIAGNOSIS — K5730 Diverticulosis of large intestine without perforation or abscess without bleeding: Secondary | ICD-10-CM | POA: Diagnosis not present

## 2018-05-25 LAB — COMPREHENSIVE METABOLIC PANEL
ALT: 19 U/L (ref 0–44)
AST: 24 U/L (ref 15–41)
Albumin: 3.5 g/dL (ref 3.5–5.0)
Alkaline Phosphatase: 77 U/L (ref 38–126)
Anion gap: 10 (ref 5–15)
BUN: 25 mg/dL — ABNORMAL HIGH (ref 8–23)
CO2: 26 mmol/L (ref 22–32)
Calcium: 8.9 mg/dL (ref 8.9–10.3)
Chloride: 102 mmol/L (ref 98–111)
Creatinine, Ser: 0.84 mg/dL (ref 0.44–1.00)
GFR calc Af Amer: >60 mL/min (ref >60)
GFR calc non Af Amer: >60 mL/min (ref >60)
Glucose, Bld: 95 mg/dL (ref 70–99)
Potassium: 3.9 mmol/L (ref 3.5–5.1)
Sodium: 138 mmol/L (ref 135–145)
Total Bilirubin: 0.3 mg/dL (ref 0.3–1.2)
Total Protein: 6.3 g/dL — ABNORMAL LOW (ref 6.5–8.1)

## 2018-05-25 LAB — URINALYSIS, ROUTINE W REFLEX MICROSCOPIC
Bilirubin Urine: NEGATIVE
Glucose, UA: NEGATIVE mg/dL
Ketones, ur: 5 mg/dL — AB
Nitrite: NEGATIVE
Protein, ur: 30 mg/dL — AB
Specific Gravity, Urine: 1.027 (ref 1.005–1.030)
pH: 5 (ref 5.0–8.0)

## 2018-05-25 LAB — CBC
HCT: 33.5 % — ABNORMAL LOW (ref 36.0–46.0)
Hemoglobin: 10.8 g/dL — ABNORMAL LOW (ref 12.0–15.0)
MCH: 30.6 pg (ref 26.0–34.0)
MCHC: 32.2 g/dL (ref 30.0–36.0)
MCV: 94.9 fL (ref 80.0–100.0)
Platelets: 320 10*3/uL (ref 150–400)
RBC: 3.53 MIL/uL — ABNORMAL LOW (ref 3.87–5.11)
RDW: 13.4 % (ref 11.5–15.5)
WBC: 12.8 10*3/uL — ABNORMAL HIGH (ref 4.0–10.5)
nRBC: 0 % (ref 0.0–0.2)

## 2018-05-25 LAB — TYPE AND SCREEN
ABO/RH(D): O NEG
Antibody Screen: NEGATIVE

## 2018-05-25 LAB — SARS CORONAVIRUS 2 BY RT PCR (HOSPITAL ORDER, PERFORMED IN ~~LOC~~ HOSPITAL LAB): SARS Coronavirus 2: NEGATIVE

## 2018-05-25 LAB — TROPONIN I: Troponin I: <0.03 ng/mL (ref <0.03)

## 2018-05-25 LAB — LIPASE, BLOOD: Lipase: 33 U/L (ref 11–51)

## 2018-05-25 LAB — POC OCCULT BLOOD, ED: Fecal Occult Bld: POSITIVE — AB

## 2018-05-25 LAB — PROTIME-INR
INR: 1.9 — ABNORMAL HIGH (ref 0.8–1.2)
Prothrombin Time: 21.7 seconds — ABNORMAL HIGH (ref 11.4–15.2)

## 2018-05-25 MED ORDER — ONDANSETRON HCL 4 MG/2ML IJ SOLN: 4.0000 mg | Freq: Four times a day (QID) | INTRAMUSCULAR | Status: DC | PRN

## 2018-05-25 MED ORDER — ACETAMINOPHEN 325 MG PO TABS: 650.0000 mg | ORAL_TABLET | Freq: Four times a day (QID) | ORAL | Status: DC | PRN

## 2018-05-25 MED ORDER — ACETAMINOPHEN 650 MG RE SUPP: 650.0000 mg | Freq: Four times a day (QID) | RECTAL | Status: DC | PRN

## 2018-05-25 MED ORDER — FAMOTIDINE IN NACL 20-0.9 MG/50ML-% IV SOLN
20.0000 mg | INTRAVENOUS | Status: DC
  Administered 2018-05-26: 20 mg via INTRAVENOUS
  Filled 2018-05-25: qty 50

## 2018-05-25 MED ORDER — SODIUM CHLORIDE 0.9 % IV SOLN
INTRAVENOUS | Status: DC
  Administered 2018-05-26: via INTRAVENOUS

## 2018-05-25 MED ORDER — ONDANSETRON HCL 4 MG PO TABS: 4.0000 mg | ORAL_TABLET | Freq: Four times a day (QID) | ORAL | Status: DC | PRN

## 2018-05-25 NOTE — ED Provider Notes (Signed)
[facility] EMERGENCY DEPARTMENT Provider Note   CSN: [id number] Arrival date & time: 05/25/18  1901    History   Chief Complaint Chief Complaint  Patient presents with  . Rectal Bleeding    HPI FABLE HUISMAN is a 85 y.o. female.     HPI Patient was diagnosed with DVT earlier this month.  Placed on Xarelto.  States that today she developed abdominal pain and multiple grossly bloody bowel movements.  Patient also is complaining of generalized headache.  No known trauma.  No definite fevers or chills.  No nausea or vomiting.  History is somewhat limited due to dementia. Past Medical History:  Diagnosis Date  . Anemia   . Arthritis    "fingers; back" (02/09/2013)  . Asthma   . CAD (coronary artery disease)    CABG surgery in 06/2005 for left main disease; normal EF; negative stress nuclear 08/2008  . Cerebrovascular disease    1993-CVA; 08/2008-mild atherosclerosis without focal stenosis  . Chronic back pain greater than 3 months duration    "nerve damage down into left leg" (02/09/2013)  . Chronic bronchitis ([place])    "haven't had it in last 2-3 years; used to get it q yr" (02/09/2013)  . COPD (chronic obstructive pulmonary disease) ([place])   . Dementia ([place])    "more than mild; having more short term memory loss recently; on Aricept" (02/09/2013)  . Diverticulosis   . Dizziness    chronic  . Family history of anesthesia complication    "makes daughter sick" (02/09/2013)  . Frequent PVCs   . Frequent UTI   . GERD (gastroesophageal reflux disease)   . HCWCBJSE(831.5)    "sometimes weekly" (02/09/2013)  . High cholesterol   . Hypertension   . Mental disorder    mild dementia  . Orthostatic hypotension   . Osteopenia   . Pneumonia    "couple times; several years ago" (02/09/2013)  . Sinus bradycardia    s/p STJ Assurity dual chamber pacemaker by Dr Rayann Heman 02/2013  . Stroke ([place])    20-30 yrs ago  . Syncope     Negative even recorder dated 3/08  . Tobacco abuse    60 pack years;  continuing    Patient Active Problem List   Diagnosis Date Noted  . Acute blood loss anemia 04/11/2015  . Chronic chest pain 04/11/2015  . Dementia ([place]) 04/11/2015  . Lower GI bleed 04/06/2015  . Pacemaker 05/27/2014  . Balance disorder 10/05/2013  . Dizziness of unknown cause 10/05/2013  . Difficulty in walking(719.7) 10/05/2013  . Muscle weakness (generalized) 10/05/2013  . Hypokalemia 09/21/2013  . Acute bronchitis 09/20/2013  . SIRS (systemic inflammatory response syndrome) ([place]) 09/20/2013  . Atypical chest pain 09/20/2013  . UTI (lower urinary tract infection) 09/20/2013  . Dyspnea 09/20/2013  . Anemia 02/09/2013  . Sick sinus syndrome ([place]) 01/27/2013  . Premature ventricular contraction 01/27/2013  . CAD (coronary artery disease) 01/27/2013  . Essential hypertension 01/27/2013  . Hx of falling, presenting hazards to health 12/21/2012  . Laboratory test 05/09/2011  . Arteriosclerotic cardiovascular disease (ASCVD)   . Cerebrovascular disease   . Syncope   . Hyperlipidemia 05/07/2009  . Tobacco abuse 05/07/2009  . Orthostatic hypotension 05/07/2009  . GERD (gastroesophageal reflux disease) 09/19/2008  . Osteopenia 09/19/2008    Past Surgical History:  Procedure Laterality Date  . ABDOMINAL HYSTERECTOMY  1990  . APPENDECTOMY    . CARDIAC CATHETERIZATION  2007  . CATARACT EXTRACTION, BILATERAL    .  COLONOSCOPY  10/10/2011   Procedure: COLONOSCOPY;  Surgeon: Rogene Houston, MD;  Location: [facility];  Service: Endoscopy;  Laterality: N/A;  1230  . COLONOSCOPY N/A 04/12/2015   Procedure: COLONOSCOPY;  Surgeon: Rogene Houston, MD;  Location: [facility];  Service: Endoscopy;  Laterality: N/A;  . CORONARY ARTERY BYPASS GRAFT  2007   Left main disease  . ESOPHAGOGASTRODUODENOSCOPY  10/10/2011   Procedure: ESOPHAGOGASTRODUODENOSCOPY (EGD);  Surgeon: Rogene Houston, MD;  Location: [facility];  Service: Endoscopy;  Laterality: N/A;  . ESOPHAGOGASTRODUODENOSCOPY  N/A 04/08/2015   Procedure: ESOPHAGOGASTRODUODENOSCOPY (EGD);  Surgeon: Rogene Houston, MD;  Location: [facility];  Service: Endoscopy;  Laterality: N/A;  . HERNIA REPAIR    . PACEMAKER INSERTION  02/09/2013   STJ Assurity pacemaker implanted by Dr Rayann Heman for symptomatic bradycardia  . PERMANENT PACEMAKER INSERTION N/A 02/09/2013   Procedure: PERMANENT PACEMAKER INSERTION;  Surgeon: Coralyn Mark, MD;  Location: [facility] CATH [facility];  Service: Cardiovascular;  Laterality: N/A;  . TONSILLECTOMY    . VENTRAL HERNIA REPAIR       OB History    Gravida  3   Para  3   Term  3   Preterm      AB      Living        SAB      TAB      Ectopic      Multiple      Live Births               Home Medications    Prior to Admission medications   Medication Sig Start Date End Date Taking? Authorizing Provider  Rivaroxaban 15 & 20 MG TBPK Take as directed on package: Start with one 15mg  tablet by mouth twice a day with food. On Day 22, switch to one 20mg  tablet once a day with food. Patient taking differently: Take 15-20 mg by mouth See admin instructions. Take as directed on package: Start with one 15mg  tablet by mouth twice a day with food. On Day 22, switch to one 20mg  tablet once a day with food. 05/12/18  Yes Idol, Almyra Free, PA-C  acetaminophen (TYLENOL) 325 MG tablet Take 650 mg by mouth every 6 (six) hours as needed for pain. For pain    [provider]  busPIRone (BUSPAR) 5 MG tablet Take 5 mg by mouth 2 (two) times daily.  04/14/15   [provider]  colchicine 0.6 MG tablet Take 0.6 mg by mouth daily as needed (for gout pain).  03/23/18   [provider]  Cyanocobalamin (VITAMIN B-12 CR PO) Take 1 tablet by mouth every evening.     [provider]  donepezil (ARICEPT) 10 MG tablet Take 10 mg by mouth at bedtime.     [provider]  levocetirizine (XYZAL) 5 MG tablet Take 5 mg by mouth every morning.  04/24/18   [provider]   levothyroxine (SYNTHROID, LEVOTHROID) 50 MCG tablet Take 50 mcg by mouth every morning.  08/12/17   [provider]  memantine (NAMENDA) 5 MG tablet Take 5 mg by mouth 2 (two) times daily.  10/09/17   [provider]  Multiple Vitamins-Minerals (PRESERVISION AREDS 2+MULTI VIT) CAPS Take 1 capsule by mouth 2 (two) times a day.    [provider]  nitroGLYCERIN (NITROSTAT) 0.4 MG SL tablet PLACE ONE TABLET UNDER THE TONGUE EVERY 5 MINUTES AS NEEDED FOR CHESTPAIN DO NOT EXCEED 3 IN 24  HOURS Patient taking differently: Place 0.4 mg under the tongue every 5 (five) minutes as needed for chest pain.  04/09/17   Herminio Commons, MD  omeprazole (PRILOSEC) 20 MG capsule Take 2 capsules (40 mg total) by mouth daily. Patient taking differently: Take 20 mg by mouth every morning.  04/09/15   Regalado, Cassie Freer, MD    Family History Family History  Problem Relation Age of Onset  . Angina Mother   . Heart attack Mother   . Heart disease Other   . Arthritis Other   . Lung disease Other   . Asthma Other     Social History Social History   Tobacco Use  . Smoking status: Former Smoker    Packs/day: 0.50    Years: 60.00    Pack years: 30.00    Types: Cigarettes    Start date: 12/12/1952    Last attempt to quit: 10/20/2006    Years since quitting: 11.6  . Smokeless tobacco: Never Used  . Tobacco comment: 02/09/2013 "quit smoking in ~ 2009"  Substance Use Topics  . Alcohol use: No    Alcohol/week: 0.0 standard drinks  . Drug use: No     Allergies   Aleve [naproxen sodium]; Ativan [lorazepam]; Cephalexin; Contrast media [iodinated diagnostic agents]; Famvir [famciclovir]; Haldol [haloperidol lactate]; Iohexol; Ketek [telithromycin]; Penicillins; Shellfish allergy; and Sulfa antibiotics   Review of Systems Review of Systems  Constitutional: Negative for chills and fever.  HENT: Negative for sore throat and trouble swallowing.   Eyes: Negative for visual disturbance.   Respiratory: Negative for cough and shortness of breath.   Gastrointestinal: Positive for abdominal pain and blood in stool. Negative for diarrhea, nausea and vomiting.  Genitourinary: Negative for dysuria, flank pain and frequency.  Musculoskeletal: Negative for back pain, myalgias and neck pain.  Skin: Negative for rash and wound.  Neurological: Positive for headaches. Negative for dizziness, weakness, light-headedness and numbness.  Psychiatric/Behavioral: Positive for confusion.     Physical Exam Updated Vital Signs BP (!) 136/91   Pulse 69   Temp 97.8 F (36.6 C)   Resp 18   Ht 5\' 4"  (1.626 m)   Wt 54.8 kg   SpO2 100%   BMI 20.74 kg/m   Physical Exam Vitals signs and nursing note reviewed.  Constitutional:      Appearance: She is well-developed.  HENT:     Head: Normocephalic and atraumatic.  Eyes:     Pupils: Pupils are equal, round, and reactive to light.  Neck:     Musculoskeletal: Normal range of motion and neck supple.  Cardiovascular:     Rate and Rhythm: Normal rate and regular rhythm.  Pulmonary:     Effort: Pulmonary effort is normal.     Breath sounds: Normal breath sounds.  Abdominal:     General: Bowel sounds are normal.     Palpations: Abdomen is soft.     Tenderness: There is no abdominal tenderness. There is no guarding or rebound.  Musculoskeletal: Normal range of motion.        General: No tenderness.  Skin:    General: Skin is warm and dry.     Findings: No erythema or rash.  Neurological:     Mental Status: She is alert and oriented to person, place, and time.  Psychiatric:        Behavior: Behavior normal.      ED Treatments / Results  Labs (all labs ordered are listed, but only abnormal results are displayed)  Labs Reviewed  COMPREHENSIVE METABOLIC PANEL - Abnormal; Notable for the following components:      Result Value   BUN 25 (*)    Total Protein 6.3 (*)    All other components within normal limits  CBC - Abnormal; Notable  for the following components:   WBC 12.8 (*)    RBC 3.53 (*)    Hemoglobin 10.8 (*)    HCT 33.5 (*)    All other components within normal limits  PROTIME-INR - Abnormal; Notable for the following components:   Prothrombin Time 21.7 (*)    INR 1.9 (*)    All other components within normal limits  POC OCCULT BLOOD, ED - Abnormal; Notable for the following components:   Fecal Occult Bld POSITIVE (*)    All other components within normal limits  SARS CORONAVIRUS 2 (HOSPITAL ORDER, [place] [facility])  LIPASE, BLOOD  TROPONIN I  URINALYSIS, ROUTINE W REFLEX MICROSCOPIC  TYPE AND SCREEN    EKG None  Radiology Ct Abdomen Pelvis Wo Contrast  Result Date: 05/25/2018 CLINICAL DATA:  85 year old female with bloody stool, chest pain. On Xarelto. Minor head trauma. EXAM: CT ABDOMEN AND PELVIS WITHOUT CONTRAST TECHNIQUE: Multidetector CT imaging of the abdomen and pelvis was performed following the standard protocol without IV contrast. COMPARISON:  07/02/2005. FINDINGS: Lower chest: Mild cardiomegaly. No pericardial effusion. Cardiac pacemaker leads. Negative lung bases aside from mild lingula atelectasis. Hepatobiliary: Chronic benign liver cyst at the dome has been present since 2007. Otherwise negative noncontrast liver and gallbladder. Pancreas: Negative noncontrast pancreas. Spleen: Negative. Adrenals/Urinary Tract: Negative adrenal glands. Negative noncontrast kidneys. Decompressed proximal ureters. Unremarkable urinary bladder. Incidental pelvic phleboliths. Stomach/Bowel: Gas and stool in the rectum with no inflammation. Severe diverticulosis of the sigmoid colon, no active inflammation identified. Lesser diverticulosis of the distal descending colon with no active inflammation. Redundant but nondilated transverse colon. Negative right colon, appendix, and terminal ileum. No dilated small bowel. No mesenteric stranding negative stomach. No free air, free fluid.  Vascular/Lymphatic: Vascular patency is not evaluated in the absence of IV contrast. Extensive Aortoiliac calcified atherosclerosis. No lymphadenopathy. Reproductive: Surgically absent uterus and diminutive or absent ovaries. Other: No pelvic free fluid. Mild presacral stranding appears stable since 2007. Musculoskeletal: Prior sternotomy. Chronic appearing posterior 11th and left 12th rib fractures. No acute osseous abnormality identified. IMPRESSION: 1. Severe diverticulosis of the sigmoid, and to a lesser extent descending colon. No active inflammation identified, but consider a diverticular source of lower GI bleeding. 2. No acute or inflammatory process identified in the non-contrast abdomen or pelvis. Electronically Signed   By: Genevie Ann M.D.   On: 05/25/2018 22:31   Ct Head Wo Contrast  Result Date: 05/25/2018 CLINICAL DATA:  85 year old female with bloody stool, chest pain. On Xarelto. Minor head trauma. EXAM: CT HEAD WITHOUT CONTRAST TECHNIQUE: Contiguous axial images were obtained from the base of the skull through the vertex without intravenous contrast. COMPARISON:  Head CT 11/14/2017 and earlier. FINDINGS: Brain: Stable cerebral volume, ventricle size and configuration. Right hemisphere encephalomalacia at the posterior operculum and posterior sylvian fissure. Confluent superimposed bilateral white matter hypodensity. Stable gray-white matter differentiation throughout the brain. No midline shift, mass effect, evidence of mass lesion, intracranial hemorrhage or evidence of cortically based acute infarction. Vascular: Calcified atherosclerosis at the skull base. No suspicious intracranial vascular hyperdensity. Skull: Stable. Sinuses/Orbits: Visualized paranasal sinuses and mastoids are stable and well pneumatized. Other: No acute orbit or scalp soft tissue finding. IMPRESSION: 1.  Stable non contrast CT appearance of the brain since 2019. 2. No acute intracranial abnormality or none she acute  traumatic injury identified. Electronically Signed   By: Genevie Ann M.D.   On: 05/25/2018 22:26   Dg Chest Port 1 View  Result Date: 05/25/2018 CLINICAL DATA:  85 year old female with bloody stool, chest pain, on is a relative for DVT. EXAM: PORTABLE CHEST 1 VIEW COMPARISON:  11/14/2017 and earlier. FINDINGS: Portable AP upright view at 2037 hours. Prior CABG. No cardiomegaly. Stable cardiac size and mediastinal contours. Stable left chest pacemaker. Somewhat large lung volumes are stable. Allowing for portable technique the lungs are clear. Visualized tracheal air column is within normal limits. No pneumoperitoneum or bowel gas visible in the upper abdomen. IMPRESSION: No acute cardiopulmonary abnormality. Electronically Signed   By: Genevie Ann M.D.   On: 05/25/2018 21:05    Procedures Procedures (including critical care time)  Medications Ordered in ED Medications - No data to display   Initial Impression / Assessment and Plan / ED Course  I have reviewed the triage vital signs and the nursing notes.  Pertinent labs & imaging results that were available during my care of the patient were reviewed by me and considered in my medical decision making (see chart for details).        No further bleeding in the emergency department.  Vital signs remained stable.  Patient hemoglobin did drop from 13 to 10.  Will hold anticoagulation.  Discussed with hospitalist will see patient in the emergency department and admit.  Final Clinical Impressions(s) / ED Diagnoses   Final diagnoses:  Lower GI bleed    ED Discharge Orders    None       Julianne Rice, MD 05/25/18 2314

## 2018-05-25 NOTE — ED Triage Notes (Signed)
Per family pt is having bloody stools that started today. Pt was started on xarelto last week for dvt.

## 2018-05-25 NOTE — ED Notes (Signed)
Patient transported to CT 

## 2018-05-25 NOTE — H&P (Signed)
History and Physical    [name] IHK:[id number] DOB: [birth year] DOA: 05/25/2018  PCP: Sharilyn Sites, MD   Patient coming from: Home.  I have personally briefly reviewed patient's old medical records in [place]  Chief Complaint: Bloody stools.  HPI: [name] is a 85 y.o. female with medical history significant of anemia, osteoarthritis, asthma, coronary artery disease status CABG, chronic back pain, chronic bronchitis, dementia, diverticulosis, dizziness, frequent UTI history, GERD, hyperlipidemia, hypertension, orthostatic hypotension, osteopenia, history of pneumonia multiple times, history of CVA several decades ago, history of syncope, history of tobacco abuse who is coming to the emergency department for several episodes of loose stools since earlier today.  She was started on Xarelto last week for LLE DVT.  She is unable to provide further information due to dementia.  ED Course: Initial vital signs temperature 97.8 F, pulse 74, respirations 17, blood pressure 132/72 mmHg and O2 sat 94% on room air.  Her urinalysis showed large hemoglobinuria, ketonuria of 5 and proteinuria 30 mg/dL.  There was trace leukocyte esterase and many bacteria on microscopic examination.  WBC was 12.8, hemoglobin 10.8 g/dL and platelets 320.  PT was 21.7 seconds and INR 1.9.  Lipase is 33.  CMP shows BUN of 25 mg/dL and total protein was 6.3 g/dL.  All other values are within normal limits.  Troponin was normal.  Fecal occult blood was positive.  Review of Systems: As per HPI otherwise 10 point review of systems negative.   Past Medical History:  Diagnosis Date  . Anemia   . Arthritis    "fingers; back" (02/09/2013)  . Asthma   . CAD (coronary artery disease)    CABG surgery in 06/2005 for left main disease; normal EF; negative stress nuclear 08/2008  . Cerebrovascular disease    1993-CVA; 08/2008-mild atherosclerosis without focal stenosis  . Chronic back pain greater than 3 months  duration    "nerve damage down into left leg" (02/09/2013)  . Chronic bronchitis ([place])    "haven't had it in last 2-3 years; used to get it q yr" (02/09/2013)  . COPD (chronic obstructive pulmonary disease) ([place])   . Dementia ([place])    "more than mild; having more short term memory loss recently; on Aricept" (02/09/2013)  . Diverticulosis   . Dizziness    chronic  . Family history of anesthesia complication    "makes daughter sick" (02/09/2013)  . Frequent PVCs   . Frequent UTI   . GERD (gastroesophageal reflux disease)   . VFIEPPIR(518.8)    "sometimes weekly" (02/09/2013)  . High cholesterol   . Hypertension   . Mental disorder    mild dementia  . Orthostatic hypotension   . Osteopenia   . Pneumonia    "couple times; several years ago" (02/09/2013)  . Sinus bradycardia    s/p STJ Assurity dual chamber pacemaker by Dr Rayann Heman 02/2013  . Stroke ([place])    20-30 yrs ago  . Syncope     Negative even recorder dated 3/08  . Tobacco abuse    60 pack years; continuing    Past Surgical History:  Procedure Laterality Date  . ABDOMINAL HYSTERECTOMY  1990  . APPENDECTOMY    . CARDIAC CATHETERIZATION  2007  . CATARACT EXTRACTION, BILATERAL    . COLONOSCOPY  10/10/2011   Procedure: COLONOSCOPY;  Surgeon: Rogene Houston, MD;  [facility];  Service: Endoscopy;  Laterality: N/A;  1230  . COLONOSCOPY N/A 04/12/2015   Procedure: COLONOSCOPY;  Surgeon: Rogene Houston, MD;  [facility];  Service: Endoscopy;  Laterality: N/A;  . CORONARY ARTERY BYPASS GRAFT  2007   Left main disease  . ESOPHAGOGASTRODUODENOSCOPY  10/10/2011   Procedure: ESOPHAGOGASTRODUODENOSCOPY (EGD);  Surgeon: Rogene Houston, MD;  [facility];  Service: Endoscopy;  Laterality: N/A;  . ESOPHAGOGASTRODUODENOSCOPY N/A 04/08/2015   Procedure: ESOPHAGOGASTRODUODENOSCOPY (EGD);  Surgeon: Rogene Houston, MD;  [facility];  Service: Endoscopy;  Laterality: N/A;  . HERNIA REPAIR    . PACEMAKER  INSERTION  02/09/2013   STJ Assurity pacemaker implanted by Dr Rayann Heman for symptomatic bradycardia  . PERMANENT PACEMAKER INSERTION N/A 02/09/2013   Procedure: PERMANENT PACEMAKER INSERTION;  Surgeon: Coralyn Mark, MD;  Location: [facility];  Service: Cardiovascular;  Laterality: N/A;  . TONSILLECTOMY    . VENTRAL HERNIA REPAIR       reports that she quit smoking about 11 years ago. Her smoking use included cigarettes. She started smoking about 65 years ago. She has a 30.00 pack-year smoking history. She has never used smokeless tobacco. She reports that she does not drink alcohol or use drugs.  Allergies  Allergen Reactions  . Aleve [Naproxen Sodium]     unknown  . Ativan [Lorazepam]     Aggressive, hallucination  . Cephalexin Hives  . Contrast Media [Iodinated Diagnostic Agents]     Unknown  . Famvir [Famciclovir] Other (See Comments)    unknown  . Haldol [Haloperidol Lactate]     Aggressive, hallucination  . Iohexol     Urticaria, emesis, numbness during IVP performed at [facility] years ago  . Ketek [Telithromycin]   . Penicillins Hives    Has patient had a PCN reaction causing immediate rash, facial/tongue/throat swelling, SOB or lightheadedness with hypotension: Yes Has patient had a PCN reaction causing severe rash involving mucus membranes or skin necrosis: No Has patient had a PCN reaction that required hospitalization NoNo Has patient had a PCN reaction occurring within the last 10 years: No If all of the above answers are "NO", then may proceed with Cephalosporin use.   . Shellfish Allergy Other (See Comments)    Unknown  . Sulfa Antibiotics Hives    Family History  Problem Relation Age of Onset  . Angina Mother   . Heart attack Mother   . Heart disease Other   . Arthritis Other   . Lung disease Other   . Asthma Other    Prior to Admission medications   Medication Sig Start Date End Date Taking? Authorizing Provider  acetaminophen (TYLENOL) 325 MG  tablet Take 650 mg by mouth every 6 (six) hours as needed for pain. For pain   Yes [provider]  busPIRone (BUSPAR) 5 MG tablet Take 5 mg by mouth 2 (two) times daily.  04/14/15  Yes [provider]  colchicine 0.6 MG tablet Take 0.6 mg by mouth daily as needed (for gout pain).  03/23/18  Yes [provider]  Cyanocobalamin (VITAMIN B-12 CR PO) Take 1 tablet by mouth every evening.    Yes [provider]  levocetirizine (XYZAL) 5 MG tablet Take 5 mg by mouth every morning.  04/24/18  Yes [provider]  levothyroxine (SYNTHROID, LEVOTHROID) 50 MCG tablet Take 50 mcg by mouth every morning.  08/12/17  Yes [provider]  memantine (NAMENDA) 5 MG tablet Take 5 mg by mouth 2 (two) times daily.  10/09/17  Yes [provider]  Multiple Vitamins-Minerals (  PRESERVISION AREDS 2+MULTI VIT) CAPS Take 1 capsule by mouth 2 (two) times a day.   Yes [provider]  nitroGLYCERIN (NITROSTAT) 0.4 MG SL tablet PLACE ONE TABLET UNDER THE TONGUE EVERY 5 MINUTES AS NEEDED FOR CHESTPAIN DO NOT EXCEED 3 IN 24 HOURS Patient taking differently: Place 0.4 mg under the tongue every 5 (five) minutes as needed for chest pain.  04/09/17  Yes Herminio Commons, MD  omeprazole (PRILOSEC) 20 MG capsule Take 2 capsules (40 mg total) by mouth daily. Patient taking differently: Take 20 mg by mouth every morning.  04/09/15  Yes Regalado, [name], MD  Rivaroxaban 15 & 20 MG TBPK Take as directed on package: Start with one 15mg  tablet by mouth twice a day with food. On Day 22, switch to one 20mg  tablet once a day with food. Patient taking differently: Take 15-20 mg by mouth See admin instructions. Take as directed on package: Start with one 15mg  tablet by mouth twice a day with food. On Day 22, switch to one 20mg  tablet once a day with food. 05/12/18  Yes Idol, Almyra Free, PA-C  donepezil (ARICEPT) 10 MG tablet Take 10 mg by mouth at bedtime.     [provider]     Physical Exam: Vitals:   05/25/18 2130 05/25/18 2200 05/25/18 2230 05/25/18 2330  BP: (!) 157/145 130/60 (!) 136/91 124/66  Pulse: 65 68 69   Resp: (!) 23 16 18  (!) 24  Temp:      SpO2: 93% 100% 100%   Weight:      Height:        Constitutional: Frail, elderly female.  Currently in NAD Eyes: PERRL, lids and conjunctivae look mildly pale. ENMT: Mucous membranes are moist. Posterior pharynx clear of any exudate or lesions. Neck: normal, supple, no masses, no thyromegaly Respiratory: Decreased breath sounds in bases, otherwise clear to auscultation bilaterally, no wheezing, no crackles. Normal respiratory effort. No accessory muscle use.  Cardiovascular: Regular rate and rhythm, no murmurs / rubs / gallops. No extremity edema. 2+ pedal pulses. No carotid bruits.  Abdomen: Soft, no tenderness, no masses palpated. No hepatosplenomegaly. Bowel sounds positive.  Musculoskeletal: no clubbing / cyanosis. Good ROM, no contractures. Normal muscle tone.  Skin: Areas of ecchymosis on extremities. Neurologic: Moves all extremities and follows simple commands. Psychiatric: Oriented to name only.  Labs on Admission: I have personally reviewed following labs and imaging studies  CBC: Recent Labs  Lab 05/25/18 2030  WBC 12.8*  HGB 10.8*  HCT 33.5*  MCV 94.9  PLT 833   Basic Metabolic Panel: Recent Labs  Lab 05/25/18 2030  NA 138  K 3.9  CL 102  CO2 26  GLUCOSE 95  BUN 25*  CREATININE 0.84  CALCIUM 8.9   GFR: Estimated Creatinine Clearance: 38.4 mL/min (by C-G formula based on SCr of 0.84 mg/dL). Liver Function Tests: Recent Labs  Lab 05/25/18 2030  AST 24  ALT 19  ALKPHOS 77  BILITOT 0.3  PROT 6.3*  ALBUMIN 3.5   Recent Labs  Lab 05/25/18 2030  LIPASE 33   No results for input(s): AMMONIA in the last 168 hours. Coagulation Profile: Recent Labs  Lab 05/25/18 2030  INR 1.9*   Cardiac Enzymes: Recent Labs  Lab 05/25/18 2030  TROPONINI <0.03   BNP (last  3 results) No results for input(s): PROBNP in the last 8760 hours. HbA1C: No results for input(s): HGBA1C in the last 72 hours. CBG: No results for input(s): GLUCAP in the  last 168 hours. Lipid Profile: No results for input(s): CHOL, HDL, LDLCALC, TRIG, CHOLHDL, LDLDIRECT in the last 72 hours. Thyroid Function Tests: No results for input(s): TSH, T4TOTAL, FREET4, T3FREE, THYROIDAB in the last 72 hours. Anemia Panel: No results for input(s): VITAMINB12, FOLATE, FERRITIN, TIBC, IRON, RETICCTPCT in the last 72 hours. Urine analysis:    Component Value Date/Time   COLORURINE YELLOW 05/25/2018 2301   APPEARANCEUR CLEAR 05/25/2018 2301   LABSPEC 1.027 05/25/2018 2301   PHURINE 5.0 05/25/2018 2301   GLUCOSEU NEGATIVE 05/25/2018 2301   HGBUR LARGE (A) 05/25/2018 2301   BILIRUBINUR NEGATIVE 05/25/2018 2301   KETONESUR 5 (A) 05/25/2018 2301   PROTEINUR 30 (A) 05/25/2018 2301   UROBILINOGEN 0.2 09/20/2013 1042   NITRITE NEGATIVE 05/25/2018 2301   LEUKOCYTESUR TRACE (A) 05/25/2018 2301    Radiological Exams on Admission: Ct Abdomen Pelvis Wo Contrast  Result Date: 05/25/2018 CLINICAL DATA:  85 year old female with bloody stool, chest pain. On Xarelto. Minor head trauma. EXAM: CT ABDOMEN AND PELVIS WITHOUT CONTRAST TECHNIQUE: Multidetector CT imaging of the abdomen and pelvis was performed following the standard protocol without IV contrast. COMPARISON:  07/02/2005. FINDINGS: Lower chest: Mild cardiomegaly. No pericardial effusion. Cardiac pacemaker leads. Negative lung bases aside from mild lingula atelectasis. Hepatobiliary: Chronic benign liver cyst at the dome has been present since 2007. Otherwise negative noncontrast liver and gallbladder. Pancreas: Negative noncontrast pancreas. Spleen: Negative. Adrenals/Urinary Tract: Negative adrenal glands. Negative noncontrast kidneys. Decompressed proximal ureters. Unremarkable urinary bladder. Incidental pelvic phleboliths. Stomach/Bowel: Gas and  stool in the rectum with no inflammation. Severe diverticulosis of the sigmoid colon, no active inflammation identified. Lesser diverticulosis of the distal descending colon with no active inflammation. Redundant but nondilated transverse colon. Negative right colon, appendix, and terminal ileum. No dilated small bowel. No mesenteric stranding negative stomach. No free air, free fluid. Vascular/Lymphatic: Vascular patency is not evaluated in the absence of IV contrast. Extensive Aortoiliac calcified atherosclerosis. No lymphadenopathy. Reproductive: Surgically absent uterus and diminutive or absent ovaries. Other: No pelvic free fluid. Mild presacral stranding appears stable since 2007. Musculoskeletal: Prior sternotomy. Chronic appearing posterior 11th and left 12th rib fractures. No acute osseous abnormality identified. IMPRESSION: 1. Severe diverticulosis of the sigmoid, and to a lesser extent descending colon. No active inflammation identified, but consider a diverticular source of lower GI bleeding. 2. No acute or inflammatory process identified in the non-contrast abdomen or pelvis. Electronically Signed   By: Genevie Ann M.D.   On: 05/25/2018 22:31   Ct Head Wo Contrast  Result Date: 05/25/2018 CLINICAL DATA:  85 year old female with bloody stool, chest pain. On Xarelto. Minor head trauma. EXAM: CT HEAD WITHOUT CONTRAST TECHNIQUE: Contiguous axial images were obtained from the base of the skull through the vertex without intravenous contrast. COMPARISON:  Head CT 11/14/2017 and earlier. FINDINGS: Brain: Stable cerebral volume, ventricle size and configuration. Right hemisphere encephalomalacia at the posterior operculum and posterior sylvian fissure. Confluent superimposed bilateral white matter hypodensity. Stable gray-white matter differentiation throughout the brain. No midline shift, mass effect, evidence of mass lesion, intracranial hemorrhage or evidence of cortically based acute infarction. Vascular:  Calcified atherosclerosis at the skull base. No suspicious intracranial vascular hyperdensity. Skull: Stable. Sinuses/Orbits: Visualized paranasal sinuses and mastoids are stable and well pneumatized. Other: No acute orbit or scalp soft tissue finding. IMPRESSION: 1.  Stable non contrast CT appearance of the brain since 2019. 2. No acute intracranial abnormality or none she acute traumatic injury identified. Electronically Signed   By: Genevie Ann  M.D.   On: 05/25/2018 22:26   Dg Chest Port 1 View  Result Date: 05/25/2018 CLINICAL DATA:  85 year old female with bloody stool, chest pain, on is a relative for DVT. EXAM: PORTABLE CHEST 1 VIEW COMPARISON:  11/14/2017 and earlier. FINDINGS: Portable AP upright view at 2037 hours. Prior CABG. No cardiomegaly. Stable cardiac size and mediastinal contours. Stable left chest pacemaker. Somewhat large lung volumes are stable. Allowing for portable technique the lungs are clear. Visualized tracheal air column is within normal limits. No pneumoperitoneum or bowel gas visible in the upper abdomen. IMPRESSION: No acute cardiopulmonary abnormality. Electronically Signed   By: Genevie Ann M.D.   On: 05/25/2018 21:05    EKG: Independently reviewed.   Assessment/Plan Principal Problem:   Lower GI bleed Likely diverticular bleed/recently started Xarelto AC. Observation/telemetry. Keep n.p.o. Monitor hematocrit and hemoglobin. Transfuse as needed. Consult GI.  Active Problems:   Anemia Secondary to acute blood loss. Monitor hematocrit and hemoglobin. Transfuse as needed.    Tobacco abuse Consider nicotine replacement therapy.    GERD (gastroesophageal reflux disease) On famotidine IV.    Essential hypertension Not on antihypertensives at this time. Allow permissive hypertension in the setting of GI bleed. Monitor blood pressure.    CAD   Hyperlipidemia No longer on medical therapy.   DVT prophylaxis: SCDs. Code Status: Full code. Family  Communication: Disposition Plan: Observation for H&H trending and GI evaluation. Consults called: Routine gastroenterology consult. Admission status: Observation/telemetry.   Reubin Milan MD [facility]  05/25/2018, 11:41 PM   This document was prepared using Dragon voice recognition software and may contain some unintended transcription errors.

## 2018-05-26 ENCOUNTER — Observation Stay (HOSPITAL_COMMUNITY): Payer: Medicare Other

## 2018-05-26 DIAGNOSIS — I82431 Acute embolism and thrombosis of right popliteal vein: Secondary | ICD-10-CM | POA: Diagnosis not present

## 2018-05-26 DIAGNOSIS — E039 Hypothyroidism, unspecified: Secondary | ICD-10-CM

## 2018-05-26 DIAGNOSIS — I82411 Acute embolism and thrombosis of right femoral vein: Secondary | ICD-10-CM | POA: Diagnosis not present

## 2018-05-26 DIAGNOSIS — Z09 Encounter for follow-up examination after completed treatment for conditions other than malignant neoplasm: Secondary | ICD-10-CM | POA: Diagnosis not present

## 2018-05-26 DIAGNOSIS — I82491 Acute embolism and thrombosis of other specified deep vein of right lower extremity: Secondary | ICD-10-CM | POA: Diagnosis not present

## 2018-05-26 DIAGNOSIS — K922 Gastrointestinal hemorrhage, unspecified: Secondary | ICD-10-CM | POA: Diagnosis not present

## 2018-05-26 LAB — HEMOGLOBIN AND HEMATOCRIT, BLOOD
HCT: 28.6 % — ABNORMAL LOW (ref 36.0–46.0)
Hemoglobin: 9.1 g/dL — ABNORMAL LOW (ref 12.0–15.0)

## 2018-05-26 LAB — BASIC METABOLIC PANEL
Anion gap: 8 (ref 5–15)
BUN: 18 mg/dL (ref 8–23)
CO2: 24 mmol/L (ref 22–32)
Calcium: 8.4 mg/dL — ABNORMAL LOW (ref 8.9–10.3)
Chloride: 108 mmol/L (ref 98–111)
Creatinine, Ser: 0.7 mg/dL (ref 0.44–1.00)
GFR calc Af Amer: >60 mL/min (ref >60)
GFR calc non Af Amer: >60 mL/min (ref >60)
Glucose, Bld: 75 mg/dL (ref 70–99)
Potassium: 4.1 mmol/L (ref 3.5–5.1)
Sodium: 140 mmol/L (ref 135–145)

## 2018-05-26 LAB — CBC
HCT: 29.4 % — ABNORMAL LOW (ref 36.0–46.0)
Hemoglobin: 9.3 g/dL — ABNORMAL LOW (ref 12.0–15.0)
MCH: 30.8 pg (ref 26.0–34.0)
MCHC: 31.6 g/dL (ref 30.0–36.0)
MCV: 97.4 fL (ref 80.0–100.0)
Platelets: 269 10*3/uL (ref 150–400)
RBC: 3.02 MIL/uL — ABNORMAL LOW (ref 3.87–5.11)
RDW: 13.4 % (ref 11.5–15.5)
WBC: 9.1 10*3/uL (ref 4.0–10.5)
nRBC: 0 % (ref 0.0–0.2)

## 2018-05-26 LAB — HEMOGLOBIN: Hemoglobin: 10.1 g/dL — ABNORMAL LOW (ref 12.0–15.0)

## 2018-05-26 LAB — MRSA PCR SCREENING: MRSA by PCR: NEGATIVE

## 2018-05-26 LAB — MAGNESIUM: Magnesium: 2.1 mg/dL (ref 1.7–2.4)

## 2018-05-26 MED ORDER — MEMANTINE HCL 10 MG PO TABS
5.0000 mg | ORAL_TABLET | Freq: Two times a day (BID) | ORAL | Status: DC
  Administered 2018-05-26 – 2018-05-28 (×3): 5 mg via ORAL
  Filled 2018-05-26 (×4): qty 1

## 2018-05-26 MED ORDER — CHLORHEXIDINE GLUCONATE CLOTH 2 % EX PADS
6.0000 | MEDICATED_PAD | Freq: Every day | CUTANEOUS | Status: DC
  Administered 2018-05-26 – 2018-05-28 (×3): 6 via TOPICAL

## 2018-05-26 MED ORDER — DONEPEZIL HCL 5 MG PO TABS
5.0000 mg | ORAL_TABLET | Freq: Every day | ORAL | Status: DC
  Administered 2018-05-26 – 2018-05-27 (×2): 5 mg via ORAL
  Filled 2018-05-26 (×2): qty 1

## 2018-05-26 MED ORDER — FENTANYL CITRATE (PF) 100 MCG/2ML IJ SOLN
12.5000 ug | Freq: Once | INTRAMUSCULAR | Status: AC | PRN
  Administered 2018-05-26: 12.5 ug via INTRAVENOUS
  Filled 2018-05-26: qty 2

## 2018-05-26 MED ORDER — PANTOPRAZOLE SODIUM 40 MG PO TBEC
40.0000 mg | DELAYED_RELEASE_TABLET | Freq: Every day | ORAL | Status: DC
  Administered 2018-05-28: 40 mg via ORAL
  Filled 2018-05-26 (×2): qty 1

## 2018-05-26 MED ORDER — DIPHENHYDRAMINE HCL 50 MG/ML IJ SOLN
12.5000 mg | Freq: Once | INTRAMUSCULAR | Status: AC
  Administered 2018-05-26: 12.5 mg via INTRAVENOUS
  Filled 2018-05-26: qty 1

## 2018-05-26 MED ORDER — DEXTROSE-NACL 5-0.9 % IV SOLN
INTRAVENOUS | Status: DC
  Administered 2018-05-26 – 2018-05-27 (×2): via INTRAVENOUS

## 2018-05-26 MED ORDER — NICOTINE 14 MG/24HR TD PT24
14.0000 mg | MEDICATED_PATCH | Freq: Every day | TRANSDERMAL | Status: DC
  Administered 2018-05-27 – 2018-05-28 (×2): 14 mg via TRANSDERMAL
  Filled 2018-05-26 (×2): qty 1

## 2018-05-26 NOTE — Progress Notes (Signed)
Patient requires frequent re-positioning of the body in ways that cannot be achieved with an ordinary bed or wedge pillow, to eliminate pain, reduce pressure, and the head of the bed to be elevated more than 30 degrees most of the time due to COPD 

## 2018-05-26 NOTE — Consult Note (Signed)
Referring Provider: Roxan Hockey, MD Primary Care Physician:  Sharilyn Sites, MD Primary Gastroenterologist:  Dr. Laural Golden  Reason for Consultation:    History obtained from patient's daughter Ms. Vivi Martens as well as her records.  Patient is 85 year old Caucasian female with multiple medical problems including history of dementia coronary artery disease who has permanent pacemaker who presented to emergency room yesterday with history of passing maroonish and fresh blood per rectum.  She had multiple bowel movements.  Her daughter states she also complained of nausea.  According to ER records she also had chest pain.  Patient's hemoglobin was 10.8 g.  Because of history of chest pain she was admitted to ICU.  Her troponin level was normal.  Patient is continued to pass burgundy blood per rectum.  She had 2 bowel movements today.  She has not experienced vomiting and she denies abdominal pain.  Patient's hemoglobin has dropped to 9.3 g. Patient has a history of GI bleed back in March 2017 when she underwent EGD and colonoscopy and was felt that she had colonic diverticular bleed.  She received 1 unit of PRBCs.  At that time she was on aspirin which was discontinued. Patient fell at home on 05/10/2018 and sustained injury to her right foot.  Her daughter noted that foot swelling was not going down.  She was therefore brought to emergency room on 05/12/2018 and on evaluation she was found to have occlusive DVT involving right femoral popliteal and calf veins.  Patient was discharged on rivaroxaban.  She took last dose yesterday morning.  Patient lives at home.  2 of her sons live at home with her and and daughter has arranged for some help.  She therefore has some at home 24 hours a day.    HPI:   Past Medical History:  Diagnosis Date  . Anemia   . Arthritis    "fingers; back" (02/09/2013)  . Asthma   . CAD (coronary artery disease)    CABG surgery in 06/2005 for left main disease; normal EF;  negative stress nuclear 08/2008  . Cerebrovascular disease    1993-CVA; 08/2008-mild atherosclerosis without focal stenosis  . Chronic back pain greater than 3 months duration    "nerve damage down into left leg" (02/09/2013)  . Chronic bronchitis ([place])    "haven't had it in last 2-3 years; used to get it q yr" (02/09/2013)  . COPD (chronic obstructive pulmonary disease) ([place])   . Dementia ([place])    "more than mild; having more short term memory loss recently; on Aricept" (02/09/2013)  . Diverticulosis   . Dizziness    chronic  . Family history of anesthesia complication    "makes daughter sick" (02/09/2013)  . Frequent PVCs   . Frequent UTI   . GERD (gastroesophageal reflux disease)   . WIOXBDZH(299.2)    "sometimes weekly" (02/09/2013)  . High cholesterol   . Hypertension   . Mental disorder    mild dementia  . Orthostatic hypotension   . Osteopenia   . Pneumonia    "couple times; several years ago" (02/09/2013)  . Sinus bradycardia    s/p STJ Assurity dual chamber pacemaker by Dr Rayann Heman 02/2013  . Stroke ([place])    20-30 yrs ago  . Syncope     Negative even recorder dated 3/08  . Tobacco abuse    60 pack years; continuing    Past Surgical History:  Procedure Laterality Date  . ABDOMINAL HYSTERECTOMY  1990  . APPENDECTOMY    .  CARDIAC CATHETERIZATION  2007  . CATARACT EXTRACTION, BILATERAL    . COLONOSCOPY  10/10/2011   Procedure: COLONOSCOPY;  Surgeon: Rogene Houston, MD;  [facility];  Service: Endoscopy;  Laterality: N/A;  1230  . COLONOSCOPY N/A 04/12/2015   Procedure: COLONOSCOPY;  Surgeon: Rogene Houston, MD;  [facility];  Service: Endoscopy;  Laterality: N/A;  . CORONARY ARTERY BYPASS GRAFT  2007   Left main disease  . ESOPHAGOGASTRODUODENOSCOPY  10/10/2011   Procedure: ESOPHAGOGASTRODUODENOSCOPY (EGD);  Surgeon: Rogene Houston, MD;  [facility];  Service: Endoscopy;  Laterality: N/A;  . ESOPHAGOGASTRODUODENOSCOPY N/A 04/08/2015    Procedure: ESOPHAGOGASTRODUODENOSCOPY (EGD);  Surgeon: Rogene Houston, MD;  [facility];  Service: Endoscopy;  Laterality: N/A;  . HERNIA REPAIR    . PACEMAKER INSERTION  02/09/2013   STJ Assurity pacemaker implanted by Dr Rayann Heman for symptomatic bradycardia  . PERMANENT PACEMAKER INSERTION N/A 02/09/2013   Procedure: PERMANENT PACEMAKER INSERTION;  Surgeon: Coralyn Mark, MD;  Location: [facility];  Service: Cardiovascular;  Laterality: N/A;  . TONSILLECTOMY    . VENTRAL HERNIA REPAIR      Prior to Admission medications   Medication Sig Start Date End Date Taking? Authorizing Provider  acetaminophen (TYLENOL) 325 MG tablet Take 650 mg by mouth every 6 (six) hours as needed for pain. For pain   Yes [provider]  allopurinol (ZYLOPRIM) 100 MG tablet Take 1 tablet by mouth daily. 05/22/18  Yes [provider]  busPIRone (BUSPAR) 5 MG tablet Take 5 mg by mouth 2 (two) times daily.  04/14/15  Yes [provider]  Cyanocobalamin (VITAMIN B-12 CR PO) Take 1 tablet by mouth every evening.    Yes [provider]  donepezil (ARICEPT) 10 MG tablet Take 10 mg by mouth at bedtime.    Yes [provider]  levocetirizine (XYZAL) 5 MG tablet Take 5 mg by mouth every morning.  04/24/18  Yes [provider]  levothyroxine (SYNTHROID, LEVOTHROID) 50 MCG tablet Take 50 mcg by mouth every morning.  08/12/17  Yes [provider]  memantine (NAMENDA) 5 MG tablet Take 5 mg by mouth 2 (two) times daily.  10/09/17  Yes [provider]  Multiple Vitamins-Minerals (PRESERVISION AREDS 2+MULTI VIT) CAPS Take 1 capsule by mouth 2 (two) times a day.   Yes [provider]  nitroGLYCERIN (NITROSTAT) 0.4 MG SL tablet PLACE ONE TABLET UNDER THE TONGUE EVERY 5 MINUTES AS NEEDED FOR CHESTPAIN DO NOT EXCEED 3 IN 24 HOURS Patient taking differently: Place 0.4 mg under the tongue every 5 (five) minutes as needed for chest pain.  04/09/17  Yes  Herminio Commons, MD  omeprazole (PRILOSEC) 20 MG capsule Take 2 capsules (40 mg total) by mouth daily. Patient taking differently: Take 20 mg by mouth every morning.  04/09/15  Yes Regalado, [name], MD  Rivaroxaban 15 & 20 MG TBPK Take as directed on package: Start with one 15mg  tablet by mouth twice a day with food. On Day 22, switch to one 20mg  tablet once a day with food. Patient taking differently: Take 15-20 mg by mouth See admin instructions. Take as directed on package: Start with one 15mg  tablet by mouth twice a day with food. On Day 22, switch to one 20mg  tablet once a day with food. 05/12/18  Yes IdolAlmyra Free, PA-C    Current Facility-Administered Medications  Medication Dose Route Frequency Provider Last Rate Last Dose  .  0.9 %  sodium chloride infusion   Intravenous Continuous Reubin Milan, MD 50 mL/hr at 05/26/18 0204    . acetaminophen (TYLENOL) tablet 650 mg  650 mg Oral Q6H PRN Reubin Milan, MD       Or  . acetaminophen (TYLENOL) suppository 650 mg  650 mg Rectal Q6H PRN Reubin Milan, MD      . Chlorhexidine Gluconate Cloth 2 % PADS 6 each  6 each Topical Daily Reubin Milan, MD   6 each at 05/26/18 1100  . famotidine (PEPCID) IVPB 20 mg premix  20 mg Intravenous Q24H Reubin Milan, MD 100 mL/hr at 05/26/18 0013 20 mg at 05/26/18 0013  . ondansetron (ZOFRAN) tablet 4 mg  4 mg Oral Q6H PRN Reubin Milan, MD       Or  . ondansetron [facility]) injection 4 mg  4 mg Intravenous Q6H PRN Reubin Milan, MD        Allergies as of 05/25/2018 - Review Complete 05/25/2018  Allergen Reaction Noted  . Aleve [naproxen sodium]  04/27/2018  . Ativan [lorazepam]  04/11/2015  . Cephalexin Hives 09/19/2008  . Contrast media [iodinated diagnostic agents]  02/19/2011  . Famvir [famciclovir] Other (See Comments) 04/27/2018  . Haldol [haloperidol lactate]  04/11/2015  . Iohexol  04/23/2003  . Ketek [telithromycin]  04/27/2018  . Penicillins Hives  09/19/2008  . Shellfish allergy Other (See Comments) 02/19/2011  . Sulfa antibiotics Hives 02/19/2011    Family History  Problem Relation Age of Onset  . Angina Mother   . Heart attack Mother   . Heart disease Other   . Arthritis Other   . Lung disease Other   . Asthma Other     Social History   Socioeconomic History  . Marital status: Married    Spouse name: Percell Miller   . Number of children: Not on file  . Years of education: 34  . Highest education level: Not on file  Occupational History  . Occupation: retired  Scientific laboratory technician  . Financial resource strain: Not on file  . Food insecurity:    Worry: Not on file    Inability: Not on file  . Transportation needs:    Medical: Not on file    Non-medical: Not on file  Tobacco Use  . Smoking status: Former Smoker    Packs/day: 0.50    Years: 60.00    Pack years: 30.00    Types: Cigarettes    Start date: 12/12/1952    Last attempt to quit: 10/20/2006    Years since quitting: 11.6  . Smokeless tobacco: Never Used  . Tobacco comment: 02/09/2013 "quit smoking in ~ 2009"  Substance and Sexual Activity  . Alcohol use: No    Alcohol/week: 0.0 standard drinks  . Drug use: No  . Sexual activity: Never  Lifestyle  . Physical activity:    Days per week: Not on file    Minutes per session: Not on file  . Stress: Not on file  Relationships  . Social connections:    Talks on phone: Not on file    Gets together: Not on file    Attends religious service: Not on file    Active member of club or organization: Not on file    Attends meetings of clubs or organizations: Not on file    Relationship status: Not on file  . Intimate partner violence:    Fear of current or ex partner: Not on file  Emotionally abused: Not on file    Physically abused: Not on file    Forced sexual activity: Not on file  Other Topics Concern  . Not on file  Social History Narrative   3 children    Right handed   HS    Drinks decaff    Review of  Systems: See HPI, otherwise normal ROS  Physical Exam: Temp:  [97.1 F (36.2 C)-97.8 F (36.6 C)] 97.6 F (36.4 C) (05/19 1156) Pulse Rate:  [61-84] 61 (05/19 0755) Resp:  [14-24] 16 (05/19 1156) BP: (124-181)/(50-145) 142/96 (05/19 0600) SpO2:  [93 %-100 %] 98 % (05/19 0755) Weight:  [52.9 kg-54.8 kg] 52.9 kg (05/19 0200) Last BM Date: 05/26/18  Patient is alert and in no acute distress. She responds appropriately to simple questions. Conjunctivae is pale.  Sclerae nonicteric. Oropharyngeal mucosa is normal. Neck without thyromegaly or lymphadenopathy. Cardiac exam with regular rhythm normal S1 and S2.  No murmur gallop noted. Auscultation lungs reveal vesicular breath sounds bilaterally. Abdomen is flat.  Bowel sounds are normal.  She has lower midline and appendectomy scars.  On palpation abdomen is soft and nontender with organomegaly or masses. Extremities are thin. She has edema to dorsal aspect of right foot distally but it is not warm.   Lab Results: Recent Labs    05/25/18 2030 05/26/18 0213 05/26/18 0907  WBC 12.8*  --  9.1  HGB 10.8* 10.1* 9.3*  HCT 33.5*  --  29.4*  PLT 320  --  269   BMET Recent Labs    05/25/18 2030 05/26/18 0907  NA 138 140  K 3.9 4.1  CL 102 108  CO2 26 24  GLUCOSE 95 75  BUN 25* 18  CREATININE 0.84 0.70  CALCIUM 8.9 8.4*   LFT Recent Labs    05/25/18 2030  PROT 6.3*  ALBUMIN 3.5  AST 24  ALT 19  ALKPHOS 77  BILITOT 0.3   PT/INR Recent Labs    05/25/18 2030  LABPROT 21.7*  INR 1.9*   Hepatitis Panel No results for input(s): HEPBSAG, HCVAB, HEPAIGM, HEPBIGM in the last 72 hours.  Studies/Results: Ct Abdomen Pelvis Wo Contrast  Result Date: 05/25/2018 CLINICAL DATA:  85 year old female with bloody stool, chest pain. On Xarelto. Minor head trauma. EXAM: CT ABDOMEN AND PELVIS WITHOUT CONTRAST TECHNIQUE: Multidetector CT imaging of the abdomen and pelvis was performed following the standard protocol without IV  contrast. COMPARISON:  07/02/2005. FINDINGS: Lower chest: Mild cardiomegaly. No pericardial effusion. Cardiac pacemaker leads. Negative lung bases aside from mild lingula atelectasis. Hepatobiliary: Chronic benign liver cyst at the dome has been present since 2007. Otherwise negative noncontrast liver and gallbladder. Pancreas: Negative noncontrast pancreas. Spleen: Negative. Adrenals/Urinary Tract: Negative adrenal glands. Negative noncontrast kidneys. Decompressed proximal ureters. Unremarkable urinary bladder. Incidental pelvic phleboliths. Stomach/Bowel: Gas and stool in the rectum with no inflammation. Severe diverticulosis of the sigmoid colon, no active inflammation identified. Lesser diverticulosis of the distal descending colon with no active inflammation. Redundant but nondilated transverse colon. Negative right colon, appendix, and terminal ileum. No dilated small bowel. No mesenteric stranding negative stomach. No free air, free fluid. Vascular/Lymphatic: Vascular patency is not evaluated in the absence of IV contrast. Extensive Aortoiliac calcified atherosclerosis. No lymphadenopathy. Reproductive: Surgically absent uterus and diminutive or absent ovaries. Other: No pelvic free fluid. Mild presacral stranding appears stable since 2007. Musculoskeletal: Prior sternotomy. Chronic appearing posterior 11th and left 12th rib fractures. No acute osseous abnormality identified. IMPRESSION: 1. Severe diverticulosis of the  sigmoid, and to a lesser extent descending colon. No active inflammation identified, but consider a diverticular source of lower GI bleeding. 2. No acute or inflammatory process identified in the non-contrast abdomen or pelvis. Electronically Signed   By: Genevie Ann M.D.   On: 05/25/2018 22:31   Ct Head Wo Contrast  Result Date: 05/25/2018 CLINICAL DATA:  85 year old female with bloody stool, chest pain. On Xarelto. Minor head trauma. EXAM: CT HEAD WITHOUT CONTRAST TECHNIQUE: Contiguous axial  images were obtained from the base of the skull through the vertex without intravenous contrast. COMPARISON:  Head CT 11/14/2017 and earlier. FINDINGS: Brain: Stable cerebral volume, ventricle size and configuration. Right hemisphere encephalomalacia at the posterior operculum and posterior sylvian fissure. Confluent superimposed bilateral white matter hypodensity. Stable gray-white matter differentiation throughout the brain. No midline shift, mass effect, evidence of mass lesion, intracranial hemorrhage or evidence of cortically based acute infarction. Vascular: Calcified atherosclerosis at the skull base. No suspicious intracranial vascular hyperdensity. Skull: Stable. Sinuses/Orbits: Visualized paranasal sinuses and mastoids are stable and well pneumatized. Other: No acute orbit or scalp soft tissue finding. IMPRESSION: 1.  Stable non contrast CT appearance of the brain since 2019. 2. No acute intracranial abnormality or none she acute traumatic injury identified. Electronically Signed   By: Genevie Ann M.D.   On: 05/25/2018 22:26   Dg Chest Port 1 View  Result Date: 05/25/2018 CLINICAL DATA:  85 year old female with bloody stool, chest pain, on is a relative for DVT. EXAM: PORTABLE CHEST 1 VIEW COMPARISON:  11/14/2017 and earlier. FINDINGS: Portable AP upright view at 2037 hours. Prior CABG. No cardiomegaly. Stable cardiac size and mediastinal contours. Stable left chest pacemaker. Somewhat large lung volumes are stable. Allowing for portable technique the lungs are clear. Visualized tracheal air column is within normal limits. No pneumoperitoneum or bowel gas visible in the upper abdomen. IMPRESSION: No acute cardiopulmonary abnormality. Electronically Signed   By: Genevie Ann M.D.   On: 05/25/2018 21:05    Assessment;  Patient is 85 year old Caucasian female with history of dementia who lives at home with round-the-clock help who presents with painless large-volume hematochezia.  Patient was begun on  Rivaroxaban 14 days ago for occlusive deep venous thrombosis involving left femoral popliteal and calf veins when she was seen in emergency room for right foot injury resulting from a fall. Patient had abdominal pelvic CT revealing extensive left-sided diverticular disease but no pooling to suggest bleeding.  This was not a CTA study. Patient's hemoglobin has dropped by 1.5 g.  She is hemodynamically stable. Patient's past history significant for lower GI bleed back in March 2017 while she was on aspirin and required 1 unit of PRBCs.  Bleeding was felt to be from colonic diverticulosis.  Suspect colonic diverticular bleed.  If bleeding continues even though anticoagulant has been stopped therapeutic colonoscopy may be an option. Patient does not appear to be a candidate for long-term anticoagulation and therefore may benefit from IVC filter placement.  Follow-up Doppler ultrasound would be helpful in the management. I have discussed patient's condition with her daughter Ms. Vivi Martens who is a power of attorney. She is agreeable to proceed with IVC filter placement when she is stable. She would consider colonoscopy if bleeding continues and she has to receive blood transfusion.  Discussed with Dr. Roxan Hockey  Recommendations;  Continue supportive therapy and serial H&H's. Consider follow-up Doppler study and IVC filter as patient does not appear to be candidate for long-term anticoagulation. Patient will be reevaluated  in a.m.   LOS: 0 days   Paola Aleshire  05/26/2018, 2:47 PM

## 2018-05-26 NOTE — TOC Initial Note (Signed)
Transition of Care [facility]) - Initial/Assessment Note    Patient Details  Name: [name] MRN: [id number] Date of Birth: January 04, 1926  [facility] CM/SW Contact:    Shade Flood, LCSW Phone Number: 05/26/2018, 11:27 AM  Clinical Narrative:                  Spoke with pt's daughter by phone today as pt only oriented to self. Updated on Observation status. Also discussed pt's level of functioning prior to admission. Dtr states that pt lives in her own home but that family members with assistance from paid caregivers are with pt 24/7. Pt is total care. She can mostly feed herself. Daughter states pt is only up in wheelchair. She does not ambulate and it has become more difficult for pt to stand and pivot as of late per dtr. Dtr uses a shower chair for pt bathing or else does sponge bath. Pt wears Depends. Family takes her to the toilet regularly but pt cannot tell them when she needs to go. Prior to [place], family had been looking at ALF options but for now they continue to want to provide care at home.   Plan is for home with family care at dc. Dtr asking about hospital bed for pt. No preference for DME provider. Will also look into some type of sit to stand/pivot device and see if they are covered by insurance. If so, will arrange if family would like.   Updated MD. Will follow.  Expected Discharge Plan: Home/Self Care Barriers to Discharge: Continued Medical Work up   Patient Goals and CMS Choice        Expected Discharge Plan and Services Expected Discharge Plan: Home/Self Care     Post Acute Care Choice: Durable Medical Equipment Living arrangements for the past 2 months: Single Family Home                 DME Arranged: Hospital bed DME Agency: [employer] Date DME Agency Contacted: 05/26/18 Time DME Agency Contacted: [id number] Representative spoke with at DME Agency: Blake Divine            Prior Living Arrangements/Services Living arrangements for  the past 2 months: [place] with:: Self(pt has 24/7 care) Patient language and need for interpreter reviewed:: Yes Do you feel safe going back to the place where you live?: Yes      Need for Family Participation in Patient Care: Yes (Comment) Care giver support system in place?: Yes (comment) Current home services: DME, Other (comment)(private caregivers) Criminal Activity/Legal Involvement Pertinent to Current Situation/Hospitalization: No - Comment as needed  Activities of Daily Living Home Assistive Devices/Equipment: Wheelchair ADL Screening (condition at time of admission) Patient's cognitive ability adequate to safely complete daily activities?: No Is the patient deaf or have difficulty hearing?: No Does the patient have difficulty seeing, even when wearing glasses/contacts?: No Does the patient have difficulty concentrating, remembering, or making decisions?: Yes Patient able to express need for assistance with ADLs?: Yes Does the patient have difficulty dressing or bathing?: Yes Independently performs ADLs?: No Communication: Independent Dressing (OT): Needs assistance Is this a change from baseline?: Pre-admission baseline Grooming: Needs assistance Is this a change from baseline?: Pre-admission baseline Feeding: Independent Is this a change from baseline?: Pre-admission baseline Bathing: Needs assistance Is this a change from baseline?: Pre-admission baseline Toileting: Needs assistance Is this a change from baseline?: Pre-admission baseline In/Out Bed: Needs assistance Is this a change from baseline?: Pre-admission baseline  Walks in Home: Dependent(uses wheelchair) Is this a change from baseline?: Pre-admission baseline Does the patient have difficulty walking or climbing stairs?: Yes Weakness of Legs: Both Weakness of Arms/Hands: None  Permission Sought/Granted                  Emotional Assessment Appearance:: Appears stated  age Attitude/Demeanor/Rapport: Unable to Assess Affect (typically observed): Calm Orientation: : Oriented to Self Alcohol / Substance Use: Not Applicable Psych Involvement: No (comment)  Admission diagnosis:  Lower GI bleed [K92.2] Patient Active Problem List   Diagnosis Date Noted  . Hypothyroidism 05/26/2018  . Acute blood loss anemia 04/11/2015  . Chronic chest pain 04/11/2015  . Dementia ([place]) 04/11/2015  . Lower GI bleed 04/06/2015  . Pacemaker 05/27/2014  . Balance disorder 10/05/2013  . Dizziness of unknown cause 10/05/2013  . Difficulty in walking(719.7) 10/05/2013  . Muscle weakness (generalized) 10/05/2013  . Hypokalemia 09/21/2013  . Acute bronchitis 09/20/2013  . SIRS (systemic inflammatory response syndrome) ([place]) 09/20/2013  . Atypical chest pain 09/20/2013  . UTI (lower urinary tract infection) 09/20/2013  . Dyspnea 09/20/2013  . Anemia 02/09/2013  . Sick sinus syndrome ([place]) 01/27/2013  . Premature ventricular contraction 01/27/2013  . CAD (coronary artery disease) 01/27/2013  . Essential hypertension 01/27/2013  . Hx of falling, presenting hazards to health 12/21/2012  . Laboratory test 05/09/2011  . Arteriosclerotic cardiovascular disease (ASCVD)   . Cerebrovascular disease   . Syncope   . Hyperlipidemia 05/07/2009  . Tobacco abuse 05/07/2009  . Orthostatic hypotension 05/07/2009  . GERD (gastroesophageal reflux disease) 09/19/2008  . Osteopenia 09/19/2008   PCP:  Sharilyn Sites, MD Pharmacy:   [facility], [place] [address] Phone: [phone] Fax: [phone]     Social Determinants of Health (SDOH) Interventions    Readmission Risk Interventions No flowsheet data found.

## 2018-05-26 NOTE — Progress Notes (Signed)
Patient Demographics:    [name], is a 85 y.o. female, DOB - December 05, 1925, WIO:[id number]  Admit date - 05/25/2018   Admitting Physician Reubin Milan, MD  Outpatient Primary MD for the patient is Sharilyn Sites, MD  LOS - 0   Chief Complaint  Patient presents with  . Rectal Bleeding        Subjective:    [name] today has no fevers, no emesis,  No chest pain, continues to have maroon/burgundy stools, patient remains pleasantly confused and disoriented  Assessment  & Plan :    Principal Problem:   Lower GI bleed Active Problems:   Hyperlipidemia   Tobacco abuse   GERD (gastroesophageal reflux disease)   Essential hypertension   Anemia   Hypothyroidism  Brief Summary 85 y.o. female with medical history significant of anemia, osteoarthritis, asthma, CAD, s/p CABG, s/p pacemaker, chronic back pain, chronic bronchitis, dementia, diverticulosis,  GERD, HLD, HTN,  history of CVA .  On 05/12/2018 patient was found to have occlusive DVT involving  femoral popliteal and calf veins  She was started on Xarelto on 05/12/18     dementia coronary artery disease who has permanent pacemaker who presented to emergency room yesterday with history of passing maroonish and fresh blood per rectum.  She had multiple bowel movements.  Her daughter states she also complained of nausea.  According to ER records she also had chest pain.  Patient's hemoglobin was 10.8 g.  Because of history of chest pain she was admitted to ICU.  Her troponin level was normal.  Patient is continued to pass burgundy blood per rectum.  She had 2 bowel movements today.  She has not experienced vomiting and she denies abdominal pain.  Patient's hemoglobin has dropped to 9.3 g. Patient has a history of GI bleed back in March 2017 when she underwent EGD and colonoscopy and was felt that she had colonic diverticular bleed.  She received 1  unit of PRBCs.  At that time she was on aspirin which was discontinued. Patient fell at home on 05/10/2018 and sustained injury to her right foot.  Her daughter noted that foot swelling was not going down.  She was therefore brought to emergency room on 05/12/2018 and on evaluation she was found to have occlusive DVT involving right femoral popliteal and calf veins.  Patient was discharged on rivaroxaban.  She took last dose yesterday morning.   A/p 1)Lower GI bleed CT abd /Pelvis from 05/25/18 with Severe diverticulosis of the sigmoid, and to a lesser extent descending colon- Clinically suspect diverticular bleed, baseline hemoglobin usually around 13, hemoglobin is down to 9.3 from 10.8 on admission, check serial H&H and transfuse as clinically indicated .  Continue to hold Xarelto .  Patient had  GI bleed as well in March 2017 at that time she underwent EGD and colonoscopy and was deemed to have diverticular bleed also.  GI physician Dr. Laural Golden discussed this case with patient's POA/daughter on 05/26/18, plan is to hold off on further endoluminal evaluation at this time unless significant ongoing bleeding.   2)LE DVT--- diagnosed 05/12/2018, Xarelto was started 05/12/2018, continue to hold Xarelto in view of lower GI bleed, repeat lower extremity venous Dopplers is pending, if acute DVT persist  consider IVC filter... Given history of lower GI bleed in March 2017 requiring transfusion and another episode of GI bleed this time around patient is not a good candidate for full anticoagulation  3) acute blood loss anemia----secondary to presumed diverticular bleed, management as above #1, Xarelto on hold....  4) Tobacco abuse- nicotine replacement therapy.   5)H/o  GERD----clinically upper GI bleed much less likely may continue Protonix p.o.  6)Dementia--- patient with significant cognitive deficits, continue Aricept and Namenda  Disposition/Need for in-Hospital Stay- patient unable to be discharged at this  time due to ongoing rectal bleeding/GI blood loss, most likely will require transfusion of packed red blood cells, patient is at risk for significant hemodynamic compromise and collapse if GI bleed persist without intervention , also patient has recent DVT will most likely need IVC filter placement prior to discharge as she cannot tolerate anticoagulation due to bleeding  Code Status : Full  Family Communication:   Daughter/POA   Disposition Plan  : TBD  Consults  :  Gi..... Will most likely need IR consult for IVC filter placement  DVT Prophylaxis  :  - SCDs   Lab Results  Component Value Date   PLT 269 05/26/2018    Inpatient Medications  Scheduled Meds: . Chlorhexidine Gluconate Cloth  6 each Topical Daily   Continuous Infusions: . sodium chloride 50 mL/hr at 05/26/18 0204  . famotidine (PEPCID) IV 20 mg (05/26/18 0013)   PRN Meds:.acetaminophen **OR** acetaminophen, ondansetron **OR** ondansetron (ZOFRAN) IV    Anti-infectives (From admission, onward)   None        Objective:   Vitals:   05/26/18 0400 05/26/18 0500 05/26/18 0600 05/26/18 0755  BP: (!) 181/76 (!) 146/65 (!) 142/96   Pulse: 74 69 69 61  Resp: 14 16 20 14   Temp:    (!) 97.1 F (36.2 C)  TempSrc:    Axillary  SpO2: 100% 100% 100% 98%  Weight:      Height:        Wt Readings from Last 3 Encounters:  05/26/18 52.9 kg  05/12/18 54.9 kg  04/27/18 54.4 kg    No intake or output data in the 24 hours ending 05/26/18 0925   Physical Exam Patient is examined daily including today on 05/26/18 , exams remain the same as of yesterday except that has changed   Gen:- Awake Alert,  In no apparent distress , patient is pleasantly confused HEENT:- [place].AT, No sclera icterus Neck-Supple Neck,No JVD,.  Lungs-  CTAB , fair symmetrical air movement CV- S1, S2 normal, regular , prior sternotomy scar, pacemaker in situ Abd-  +ve B.Sounds, Abd Soft, No tenderness,    Extremity/Skin:- No  edema, pedal pulses  present Psych-pleasantly confused with significant cognitive deficits  neuro-no new focal deficits, no tremors   Data Review:   Micro Results Recent Results (from the past 240 hour(s))  SARS Coronavirus 2 (CEPHEID - Performed in [facility] hospital lab), Hosp Order     Status: None   Collection Time: 05/25/18  8:32 PM  Result Value Ref Range Status   SARS Coronavirus 2 NEGATIVE NEGATIVE Final    Comment: (NOTE) If result is NEGATIVE SARS-CoV-2 target nucleic acids are NOT DETECTED. The SARS-CoV-2 RNA is generally detectable in upper and lower  respiratory specimens during the acute phase of infection. The lowest  concentration of SARS-CoV-2 viral copies this assay can detect is 250  copies / mL. A negative result does not preclude SARS-CoV-2 infection  and should  not be used as the sole basis for treatment or other  patient management decisions.  A negative result may occur with  improper specimen collection / handling, submission of specimen other  than nasopharyngeal swab, presence of viral mutation(s) within the  areas targeted by this assay, and inadequate number of viral copies  (<250 copies / mL). A negative result must be combined with clinical  observations, patient history, and epidemiological information. If result is POSITIVE SARS-CoV-2 target nucleic acids are DETECTED. The SARS-CoV-2 RNA is generally detectable in upper and lower  respiratory specimens dur ing the acute phase of infection.  Positive  results are indicative of active infection with SARS-CoV-2.  Clinical  correlation with patient history and other diagnostic information is  necessary to determine patient infection status.  Positive results do  not rule out bacterial infection or co-infection with other viruses. If result is PRESUMPTIVE POSTIVE SARS-CoV-2 nucleic acids MAY BE PRESENT.   A presumptive positive result was obtained on the submitted specimen  and confirmed on repeat testing.  While 2019  novel coronavirus  (SARS-CoV-2) nucleic acids may be present in the submitted sample  additional confirmatory testing may be necessary for epidemiological  and / or clinical management purposes  to differentiate between  SARS-CoV-2 and other Sarbecovirus currently known to infect humans.  If clinically indicated additional testing with an alternate test  methodology [phone]) is advised. The SARS-CoV-2 RNA is generally  detectable in upper and lower respiratory sp ecimens during the acute  phase of infection. The expected result is Negative. Fact Sheet for Patients:  StrictlyIdeas.no Fact Sheet for Healthcare Providers: BankingDealers.co.za This test is not yet approved or cleared by the [place] FDA and has been authorized for detection and/or diagnosis of SARS-CoV-2 by FDA under an Emergency Use Authorization (EUA).  This EUA will remain in effect (meaning this test can be used) for the duration of the COVID-19 declaration under Section 564(b)(1) of the Act, 21 U.S.C. section 360bbb-3(b)(1), unless the authorization is terminated or revoked sooner. Performed at [facility], [address], [place], [place] [address]   MRSA PCR Screening     Status: None   Collection Time: 05/26/18  1:34 AM  Result Value Ref Range Status   MRSA by PCR NEGATIVE NEGATIVE Final    Comment:        The GeneXpert MRSA Assay (FDA approved for NASAL specimens only), is one component of a comprehensive MRSA colonization surveillance program. It is not intended to diagnose MRSA infection nor to guide or monitor treatment for MRSA infections. Performed at [facility], [address], [place], [place] [address]     Radiology Reports Ct Abdomen Pelvis Wo Contrast  Result Date: 05/25/2018 CLINICAL DATA:  85 year old female with bloody stool, chest pain. On Xarelto. Minor head trauma. EXAM: CT ABDOMEN AND PELVIS WITHOUT CONTRAST TECHNIQUE: Multidetector  CT imaging of the abdomen and pelvis was performed following the standard protocol without IV contrast. COMPARISON:  07/02/2005. FINDINGS: Lower chest: Mild cardiomegaly. No pericardial effusion. Cardiac pacemaker leads. Negative lung bases aside from mild lingula atelectasis. Hepatobiliary: Chronic benign liver cyst at the dome has been present since 2007. Otherwise negative noncontrast liver and gallbladder. Pancreas: Negative noncontrast pancreas. Spleen: Negative. Adrenals/Urinary Tract: Negative adrenal glands. Negative noncontrast kidneys. Decompressed proximal ureters. Unremarkable urinary bladder. Incidental pelvic phleboliths. Stomach/Bowel: Gas and stool in the rectum with no inflammation. Severe diverticulosis of the sigmoid colon, no active inflammation identified. Lesser diverticulosis of the distal descending colon with no active  inflammation. Redundant but nondilated transverse colon. Negative right colon, appendix, and terminal ileum. No dilated small bowel. No mesenteric stranding negative stomach. No free air, free fluid. Vascular/Lymphatic: Vascular patency is not evaluated in the absence of IV contrast. Extensive Aortoiliac calcified atherosclerosis. No lymphadenopathy. Reproductive: Surgically absent uterus and diminutive or absent ovaries. Other: No pelvic free fluid. Mild presacral stranding appears stable since 2007. Musculoskeletal: Prior sternotomy. Chronic appearing posterior 11th and left 12th rib fractures. No acute osseous abnormality identified. IMPRESSION: 1. Severe diverticulosis of the sigmoid, and to a lesser extent descending colon. No active inflammation identified, but consider a diverticular source of lower GI bleeding. 2. No acute or inflammatory process identified in the non-contrast abdomen or pelvis. Electronically Signed   By: Genevie Ann M.D.   On: 05/25/2018 22:31   Dg Tibia/fibula Right  Result Date: 05/12/2018 CLINICAL DATA:  Right foot swelling.  Recent fall. EXAM:  RIGHT TIBIA AND FIBULA - 2 VIEW COMPARISON:  None. FINDINGS: Mild spurring of the tibial spine and marginal spurring in the lateral compartment of the knee. Abnormal soft tissue swelling overlying the lateral malleolus and to a lesser extent the medial malleolus, without underlying malleolar fracture identified. Subcutaneous edema along the calf, although less striking/confluent than that at the malleoli. Mild marginal spurring of the patella. No knee joint effusion identified. No discrete fracture of the tibia or fibula identified. IMPRESSION: 1. Subcutaneous edema in the calf with more confluent subcutaneous edema overlying the malleoli, lateral greater than medial. No appreciable underlying fracture. Electronically Signed   By: Van Clines M.D.   On: 05/12/2018 18:33   Ct Head Wo Contrast  Result Date: 05/25/2018 CLINICAL DATA:  85 year old female with bloody stool, chest pain. On Xarelto. Minor head trauma. EXAM: CT HEAD WITHOUT CONTRAST TECHNIQUE: Contiguous axial images were obtained from the base of the skull through the vertex without intravenous contrast. COMPARISON:  Head CT 11/14/2017 and earlier. FINDINGS: Brain: Stable cerebral volume, ventricle size and configuration. Right hemisphere encephalomalacia at the posterior operculum and posterior sylvian fissure. Confluent superimposed bilateral white matter hypodensity. Stable gray-white matter differentiation throughout the brain. No midline shift, mass effect, evidence of mass lesion, intracranial hemorrhage or evidence of cortically based acute infarction. Vascular: Calcified atherosclerosis at the skull base. No suspicious intracranial vascular hyperdensity. Skull: Stable. Sinuses/Orbits: Visualized paranasal sinuses and mastoids are stable and well pneumatized. Other: No acute orbit or scalp soft tissue finding. IMPRESSION: 1.  Stable non contrast CT appearance of the brain since 2019. 2. No acute intracranial abnormality or none she acute  traumatic injury identified. Electronically Signed   By: Genevie Ann M.D.   On: 05/25/2018 22:26   US Venous Img Lower Right (dvt Study)  Result Date: 05/12/2018 CLINICAL DATA:  Left foot and leg edema EXAM: LEFT LOWER EXTREMITY VENOUS DOPPLER ULTRASOUND TECHNIQUE: Gray-scale sonography with graded compression, as well as color Doppler and duplex ultrasound were performed to evaluate the lower extremity deep venous systems from the level of the common femoral vein and including the common femoral, femoral, profunda femoral, popliteal and calf veins including the posterior tibial, peroneal and gastrocnemius veins when visible. The superficial great saphenous vein was also interrogated. Spectral Doppler was utilized to evaluate flow at rest and with distal augmentation maneuvers in the common femoral, femoral and popliteal veins. COMPARISON:  None. FINDINGS: Contralateral Common Femoral Vein: Respiratory phasicity is normal and symmetric with the symptomatic side. No evidence of thrombus. Normal compressibility. Common Femoral Vein: No evidence of thrombus. Normal compressibility,  respiratory phasicity and response to augmentation. Saphenofemoral Junction: No evidence of thrombus. Normal compressibility and flow on color Doppler imaging. Profunda Femoral Vein: No evidence of thrombus. Normal compressibility and flow on color Doppler imaging. Femoral Vein: Occlusive deep venous thrombosis without compressibility or phasicity. Popliteal Vein: Occlusive deep venous thrombosis without compressibility or phasicity. Calf Veins: Occlusive deep venous thrombosis without compressibility or phasicity. Superficial Great Saphenous Vein: No evidence of thrombus. Normal compressibility. Venous Reflux:  None. Other Findings:  None. IMPRESSION: Occlusive deep venous thrombosis involving the femoral vein, popliteal vein and the calf veins. Electronically Signed   By: Kathreen Devoid   On: 05/12/2018 18:13   Dg Chest Port 1  View  Result Date: 05/25/2018 CLINICAL DATA:  85 year old female with bloody stool, chest pain, on is a relative for DVT. EXAM: PORTABLE CHEST 1 VIEW COMPARISON:  11/14/2017 and earlier. FINDINGS: Portable AP upright view at 2037 hours. Prior CABG. No cardiomegaly. Stable cardiac size and mediastinal contours. Stable left chest pacemaker. Somewhat large lung volumes are stable. Allowing for portable technique the lungs are clear. Visualized tracheal air column is within normal limits. No pneumoperitoneum or bowel gas visible in the upper abdomen. IMPRESSION: No acute cardiopulmonary abnormality. Electronically Signed   By: Genevie Ann M.D.   On: 05/25/2018 21:05   Dg Foot Complete Right  Result Date: 05/12/2018 CLINICAL DATA:  Right foot swelling starting on Sunday. Recent fall. EXAM: RIGHT FOOT COMPLETE - 3+ VIEW COMPARISON:  None. FINDINGS: Diffuse bony demineralization. Small plantar calcaneal spur. Dorsal subcutaneous edema along the forefoot. Degenerative articular space narrowing at the first metatarsophalangeal articulation. IMPRESSION: 1. No fracture is identified. There is dorsal subcutaneous edema in the forefoot. 2. Diffuse bony demineralization. 3. Degenerative articular space narrowing at the first MTP joint. 4. Small plantar calcaneal spur. Electronically Signed   By: Van Clines M.D.   On: 05/12/2018 18:30     CBC Recent Labs  Lab 05/25/18 2030 05/26/18 0213 05/26/18 0907  WBC 12.8*  --  9.1  HGB 10.8* 10.1* 9.3*  HCT 33.5*  --  29.4*  PLT 320  --  269  MCV 94.9  --  97.4  MCH 30.6  --  30.8  MCHC 32.2  --  31.6  RDW 13.4  --  13.4    Chemistries  Recent Labs  Lab 05/25/18 2030 05/25/18 2335  NA 138  --   K 3.9  --   CL 102  --   CO2 26  --   GLUCOSE 95  --   BUN 25*  --   CREATININE 0.84  --   CALCIUM 8.9  --   MG  --  2.1  AST 24  --   ALT 19  --   ALKPHOS 77  --   BILITOT 0.3  --     ------------------------------------------------------------------------------------------------------------------ No results for input(s): CHOL, HDL, LDLCALC, TRIG, CHOLHDL, LDLDIRECT in the last 72 hours.  No results found for: HGBA1C ------------------------------------------------------------------------------------------------------------------ No results for input(s): TSH, T4TOTAL, T3FREE, THYROIDAB in the last 72 hours.  Invalid input(s): FREET3 ------------------------------------------------------------------------------------------------------------------ No results for input(s): VITAMINB12, FOLATE, FERRITIN, TIBC, IRON, RETICCTPCT in the last 72 hours.  Coagulation profile Recent Labs  Lab 05/25/18 2030  INR 1.9*    No results for input(s): DDIMER in the last 72 hours.  Cardiac Enzymes Recent Labs  Lab 05/25/18 2030  TROPONINI <0.03   ------------------------------------------------------------------------------------------------------------------    Component Value Date/Time   BNP 310.0 (H) 04/11/2015 1304     [name]  [name] M.D on 05/26/2018 at 9:25 AM  Go to [email or web address] - for contact info  [facility] - Office  [phone]

## 2018-05-26 NOTE — Care Management Obs Status (Signed)
[facility] NOTIFICATION   Patient Details  Name: NANCE MCCOMBS MRN: [id number] Date of Birth: 1925-02-28   Medicare Observation Status Notification Given:  Yes    Shade Flood, LCSW 05/26/2018, 11:50 AM

## 2018-05-27 ENCOUNTER — Observation Stay (HOSPITAL_BASED_OUTPATIENT_CLINIC_OR_DEPARTMENT_OTHER): Payer: Medicare Other

## 2018-05-27 DIAGNOSIS — K922 Gastrointestinal hemorrhage, unspecified: Secondary | ICD-10-CM | POA: Diagnosis not present

## 2018-05-27 DIAGNOSIS — K219 Gastro-esophageal reflux disease without esophagitis: Secondary | ICD-10-CM | POA: Diagnosis not present

## 2018-05-27 DIAGNOSIS — D649 Anemia, unspecified: Secondary | ICD-10-CM | POA: Diagnosis not present

## 2018-05-27 DIAGNOSIS — E7800 Pure hypercholesterolemia, unspecified: Secondary | ICD-10-CM

## 2018-05-27 DIAGNOSIS — I351 Nonrheumatic aortic (valve) insufficiency: Secondary | ICD-10-CM | POA: Diagnosis not present

## 2018-05-27 DIAGNOSIS — I10 Essential (primary) hypertension: Secondary | ICD-10-CM

## 2018-05-27 DIAGNOSIS — I2782 Chronic pulmonary embolism: Secondary | ICD-10-CM | POA: Diagnosis not present

## 2018-05-27 DIAGNOSIS — I361 Nonrheumatic tricuspid (valve) insufficiency: Secondary | ICD-10-CM | POA: Diagnosis not present

## 2018-05-27 DIAGNOSIS — E039 Hypothyroidism, unspecified: Secondary | ICD-10-CM

## 2018-05-27 LAB — BASIC METABOLIC PANEL
Anion gap: 4 — ABNORMAL LOW (ref 5–15)
BUN: 9 mg/dL (ref 8–23)
CO2: 24 mmol/L (ref 22–32)
Calcium: 8.6 mg/dL — ABNORMAL LOW (ref 8.9–10.3)
Chloride: 113 mmol/L — ABNORMAL HIGH (ref 98–111)
Creatinine, Ser: 0.65 mg/dL (ref 0.44–1.00)
GFR calc Af Amer: >60 mL/min (ref >60)
GFR calc non Af Amer: >60 mL/min (ref >60)
Glucose, Bld: 83 mg/dL (ref 70–99)
Potassium: 3.6 mmol/L (ref 3.5–5.1)
Sodium: 141 mmol/L (ref 135–145)

## 2018-05-27 LAB — CBC
HCT: 32.5 % — ABNORMAL LOW (ref 36.0–46.0)
Hemoglobin: 9.9 g/dL — ABNORMAL LOW (ref 12.0–15.0)
MCH: 29.6 pg (ref 26.0–34.0)
MCHC: 30.5 g/dL (ref 30.0–36.0)
MCV: 97 fL (ref 80.0–100.0)
Platelets: 297 10*3/uL (ref 150–400)
RBC: 3.35 MIL/uL — ABNORMAL LOW (ref 3.87–5.11)
RDW: 13.2 % (ref 11.5–15.5)
WBC: 9.1 10*3/uL (ref 4.0–10.5)
nRBC: 0 % (ref 0.0–0.2)

## 2018-05-27 LAB — ECHOCARDIOGRAM COMPLETE
Height: 64 in
Weight: 1865.97 oz

## 2018-05-27 MED ORDER — BOOST / RESOURCE BREEZE PO LIQD CUSTOM
1.0000 | Freq: Two times a day (BID) | ORAL | Status: DC
  Administered 2018-05-27 – 2018-05-28 (×2): 1 via ORAL

## 2018-05-27 MED ORDER — LEVOTHYROXINE SODIUM 25 MCG PO TABS
50.0000 ug | ORAL_TABLET | Freq: Every day | ORAL | Status: DC
  Administered 2018-05-28: 50 ug via ORAL
  Filled 2018-05-27: qty 2

## 2018-05-27 MED ORDER — HYDRALAZINE HCL 20 MG/ML IJ SOLN
5.0000 mg | Freq: Once | INTRAMUSCULAR | Status: AC
  Administered 2018-05-27: 5 mg via INTRAVENOUS
  Filled 2018-05-27: qty 1

## 2018-05-27 NOTE — Progress Notes (Signed)
  Subjective:  Patient has no complaints.  She denies right foot pain. According to nursing staff she has not had a bowel movement in over 20 hours.  Objective: Blood pressure 114/61, pulse 60, temperature 98.4 F (36.9 C), resp. rate 14, height '5\' 4"'$  (1.626 m), weight 52.9 kg, SpO2 98 %. Patient is alert and responds appropriately to simple questions. Her abdomen is flat and soft. She remains with mild edema to the distal right foot.  It is not tender or warm to touch.  Labs/studies Results:  CBC Latest Ref Rng & Units 05/27/2018 05/26/2018 05/26/2018  WBC 4.0 - 10.5 K/uL 9.1 - 9.1  Hemoglobin 12.0 - 15.0 g/dL 9.9(L) 9.1(L) 9.3(L)  Hematocrit 36.0 - 46.0 % 32.5(L) 28.6(L) 29.4(L)  Platelets 150 - 400 K/uL 297 - 269    CMP Latest Ref Rng & Units 05/27/2018 05/26/2018 05/25/2018  Glucose 70 - 99 mg/dL 83 75 95  BUN 8 - 23 mg/dL 9 18 25(H)  Creatinine 0.44 - 1.00 mg/dL 0.65 0.70 0.84  Sodium 135 - 145 mmol/L 141 140 138  Potassium 3.5 - 5.1 mmol/L 3.6 4.1 3.9  Chloride 98 - 111 mmol/L 113(H) 108 102  CO2 22 - 32 mmol/L '24 24 26  '$ Calcium 8.9 - 10.3 mg/dL 8.6(L) 8.4(L) 8.9  Total Protein 6.5 - 8.1 g/dL - - 6.3(L)  Total Bilirubin 0.3 - 1.2 mg/dL - - 0.3  Alkaline Phos 38 - 126 U/L - - 77  AST 15 - 41 U/L - - 24  ALT 0 - 44 U/L - - 19    Hepatic Function Latest Ref Rng & Units 05/25/2018 11/14/2017 01/30/2017  Total Protein 6.5 - 8.1 g/dL 6.3(L) 6.4(L) 7.1  Albumin 3.5 - 5.0 g/dL 3.5 3.4(L) 3.9  AST 15 - 41 U/L '24 16 22  '$ ALT 0 - 44 U/L '19 10 15  '$ Alk Phosphatase 38 - 126 U/L 77 69 55  Total Bilirubin 0.3 - 1.2 mg/dL 0.3 0.9 0.7  Bilirubin, Direct 0.1 - 0.5 mg/dL - - -    Doppler study of right lower extremity reveals complete resolution of thrombus in left femoral, popliteal and calf veins.  Assessment:  #1.  Lower GI bleed felt to be due to colonic diverticulosis.  She has stopped bleeding.  Hemoglobin is coming up.  Bleeding occurred in the setting of anticoagulation for DVT.  She  bled in March 2017 when she was on aspirin. Unless she rebleeds no indication for further evaluation.  #2.  Anemia secondary to GI bleed.  Hemoglobin is now coming up.  #3.  History of right DVT.  There is been complete resolution of clot with 2 weeks of Xarelto.  Since the clot has resolved no indication for IVC filter etc. Discussed with her daughter Ms. Vivi Martens and she agrees.  #4.  Advanced dementia.   Recommendations:  Advance diet. H&H in a.m. Unless there is evidence of recurrent bleed she should be able to go home tomorrow.

## 2018-05-27 NOTE — Progress Notes (Signed)
PROGRESS NOTE    [name]  GQQ:[id number] DOB: [birth year] DOA: 05/25/2018 PCP: Sharilyn Sites, MD    Brief Narrative:  85 year old female who presented with rectal bleeding.  She does have significant past medical history for chronic anemia, osteoarthritis, asthma, coronary artery disease status post bypass grafting, she is status post pacemaker, chronic back pain, dementia, GERD, diverticulosis, dyslipidemia, hypertension and history of CVA.  Recent diagnosis of DVT involving the right lower extremity and placed on rivaroxaban for anticoagulation.  She was not able to provide detailed history due to her dementia.  On her initial physical examination her temperature was 97.8, heart rate 74, respiratory rate 17, blood pressure 132/72, oxygen saturation 94%.  She was ill looking appearing, frail and elderly, positive pallor, moist mucous membranes, lungs with decreased breath sounds at bases, heart S1-S2 present and rhythmic, abdomen was soft nontender, no lower extremity edema.  She was oriented only to self.  Sodium 138, potassium 3.9, chloride 102, bicarb 26, glucose 95, BUN 25, creatinine 0.84, white count 12.8, hemoglobin 10.8, hematocrit 33.5, platelets 320.  SARS COVID-19 negative.  Urinalysis negative for infection.  Of the abdomen with severe diverticulosis of the sigmoid and lesser extent descending colon.  No active inflammation.  Chest x-ray, left rotation, large pulmonary artery, no infiltrates.  Pacer in place, sternotomy wires.  Patient was admitted to the hospital for working diagnosis of acute blood loss anemia due to lower gastrointestinal bleeding.  Assessment & Plan:   Principal Problem:   Lower GI bleed Active Problems:   Hyperlipidemia   Tobacco abuse   GERD (gastroesophageal reflux disease)   Essential hypertension   Anemia   Hypothyroidism   1. Acute blood loss anemia due to lower gastrointestinal bleeding, diverticular bleed. Hgb is 9.9 this am, no bowel movement  since yesterday. No signs of abdominal pain. Will continue H&H monitoring, continue to hold on anticoagulation. Case discussed with Dr. Laural Golden, no indication for endoscopic procedure at this time. Continue diet as tolerated.   2. Recent right lower extremity DVT. Initial US done on 05.05, repeat study from 05.19 negative for DVT. Patient has a high pretest probability for DVT due to comorbid conditions. Will check echocardiogram, to assess RV function. Patient with very poor quality of life due to dementia, poor prognosis. Doubt will benefit from IVC filter at this point. Continue to hold on anticoagulation for now.   3. Dementia with acute encephalopathy. Patient not much interactive, continue mittens for safety. Neuro checks per unit protocol, physical therapy and nutrition consultation. Continue memantine and donepezil.   4. Tobacco abuse. Continue nicotine patch.   5. Hypothyroid. Will resume levothyroxine.   DVT prophylaxis: scd   Code Status:  full Family Communication: no family at the bedside  Disposition Plan/ discharge barriers:  Telemetry   Body mass index is 20.02 kg/m. Malnutrition Type:      Malnutrition Characteristics:      Nutrition Interventions:     RN Pressure Injury Documentation:     Consultants:   GI   Procedures:     Antimicrobials:       Subjective: Patient is not in distress but not interactive, has mittens in place, not able to provide any detail history. All information from nursing. Yesterday had bloody bowel movements x2, no further bleeding today.   Objective: Vitals:   05/27/18 0400 05/27/18 0500 05/27/18 0600 05/27/18 0801  BP: 138/75 133/70 136/62   Pulse: (!) 58 61 60   Resp: 16 13 16  Temp:    98.4 F (36.9 C)  TempSrc:      SpO2: 100% 98% 98%   Weight:      Height:        Intake/Output Summary (Last 24 hours) at 05/27/2018 0827 Last data filed at 05/27/2018 0500 Gross per 24 hour  Intake 1507.12 ml  Output 1000  ml  Net 507.12 ml   Filed Weights   05/25/18 1908 05/26/18 0200  Weight: 54.8 kg 52.9 kg    Examination:   General: deconditioned and ill looking appearing  Neurology: Awake, disorientated and not interactive.  E ENT: mild pallor, no icterus, oral mucosa moist Cardiovascular: No JVD. S1-S2 present, rhythmic, no gallops, rubs, or murmurs. No lower extremity edema. Pulmonary: positive breath sounds bilaterally, adequate air movement, no wheezing, rhonchi or rales. (anterior auscultation)  Gastrointestinal. Abdomen with, no organomegaly, non tender, no rebound or guarding Skin. No rashes Musculoskeletal: no joint deformities     Data Reviewed: I have personally reviewed following labs and imaging studies  CBC: Recent Labs  Lab 05/25/18 2030 05/26/18 0213 05/26/18 0907 05/26/18 1608 05/27/18 0347  WBC 12.8*  --  9.1  --  9.1  HGB 10.8* 10.1* 9.3* 9.1* 9.9*  HCT 33.5*  --  29.4* 28.6* 32.5*  MCV 94.9  --  97.4  --  97.0  PLT 320  --  269  --  099   Basic Metabolic Panel: Recent Labs  Lab 05/25/18 2030 05/25/18 2335 05/26/18 0907 05/27/18 0347  NA 138  --  140 141  K 3.9  --  4.1 3.6  CL 102  --  108 113*  CO2 26  --  24 24  GLUCOSE 95  --  75 83  BUN 25*  --  18 9  CREATININE 0.84  --  0.70 0.65  CALCIUM 8.9  --  8.4* 8.6*  MG  --  2.1  --   --    GFR: Estimated Creatinine Clearance: 39 mL/min (by C-G formula based on SCr of 0.65 mg/dL). Liver Function Tests: Recent Labs  Lab 05/25/18 2030  AST 24  ALT 19  ALKPHOS 77  BILITOT 0.3  PROT 6.3*  ALBUMIN 3.5   Recent Labs  Lab 05/25/18 2030  LIPASE 33   No results for input(s): AMMONIA in the last 168 hours. Coagulation Profile: Recent Labs  Lab 05/25/18 2030  INR 1.9*   Cardiac Enzymes: Recent Labs  Lab 05/25/18 2030  TROPONINI <0.03   BNP (last 3 results) No results for input(s): PROBNP in the last 8760 hours. HbA1C: No results for input(s): HGBA1C in the last 72 hours. CBG: No  results for input(s): GLUCAP in the last 168 hours. Lipid Profile: No results for input(s): CHOL, HDL, LDLCALC, TRIG, CHOLHDL, LDLDIRECT in the last 72 hours. Thyroid Function Tests: No results for input(s): TSH, T4TOTAL, FREET4, T3FREE, THYROIDAB in the last 72 hours. Anemia Panel: No results for input(s): VITAMINB12, FOLATE, FERRITIN, TIBC, IRON, RETICCTPCT in the last 72 hours.    Radiology Studies: I have reviewed all of the imaging during this hospital visit personally     Scheduled Meds: . Chlorhexidine Gluconate Cloth  6 each Topical Daily  . donepezil  5 mg Oral QHS  . memantine  5 mg Oral BID  . nicotine  14 mg Transdermal Daily  . pantoprazole  40 mg Oral Daily   Continuous Infusions: . dextrose 5 % and 0.9% NaCl 40 mL/hr at 05/27/18 0300     LOS: 0  days        Mauricio Gerome Apley, MD

## 2018-05-27 NOTE — Progress Notes (Signed)
*  PRELIMINARY RESULTS* Echocardiogram 2D Echocardiogram has been performed.  Leavy Cella 05/27/2018, 5:03 PM

## 2018-05-27 NOTE — Evaluation (Signed)
Physical Therapy Evaluation Patient Details Name: [name] MRN: [id number] DOB: Jun 12, 1925 Today's Date: 05/27/2018   History of Present Illness  [name] is a 85 y.o. female with medical history significant of anemia, osteoarthritis, asthma, coronary artery disease status CABG, chronic back pain, chronic bronchitis, dementia, diverticulosis, dizziness, frequent UTI history, GERD, hyperlipidemia, hypertension, orthostatic hypotension, osteopenia, history of pneumonia multiple times, history of CVA several decades ago, history of syncope, history of tobacco abuse who is coming to the emergency department for several episodes of loose stools since earlier today.  She was started on Xarelto last week for LLE DVT.  She is unable to provide further information due to dementia.    Clinical Impression  Patient cooperative with encouragement, demonstrates slow labored movement for sitting up at bedside, frequently falls backwards while seated due to weakness, limited to few steps using RW to transfer to chair with frequent scissoring of LLE and tolerated sitting up in chair after therapy - RN notified.  Patient will benefit from continued physical therapy in hospital and recommended venue below to increase strength, balance, endurance for safe ADLs and gait.    Follow Up Recommendations Supervision/Assistance - 24 hour;Supervision for mobility/OOB;Home health PT    Equipment Recommendations  None recommended by PT    Recommendations for Other Services       Precautions / Restrictions Precautions Precautions: Fall Restrictions Weight Bearing Restrictions: No      Mobility  Bed Mobility Overal bed mobility: Needs Assistance Bed Mobility: Supine to Sit     Supine to sit: Mod assist;Max assist     General bed mobility comments: slow labored movement, frequent falling backwards  Transfers Overall transfer level: Needs assistance Equipment used: Rolling walker (2  wheeled) Transfers: Sit to/from [facility] Sit to Stand: Mod assist;Max assist         General transfer comment: very unsteady on feet with tendency to scissor LLE  Ambulation/Gait Ambulation/Gait assistance: Max assist Gait Distance (Feet): 3 Feet Assistive device: Rolling walker (2 wheeled) Gait Pattern/deviations: Decreased step length - right;Decreased step length - left;Decreased stride length;Scissoring Gait velocity: slow   General Gait Details: Patient limited to 3-4 slow unsteady side steps at bedside due to weakness and poor standing balance, scissoring of LLE  Stairs            Wheelchair Mobility    Modified Rankin (Stroke Patients Only)       Balance Overall balance assessment: Needs assistance Sitting-balance support: Feet supported;No upper extremity supported Sitting balance-Leahy Scale: Poor Sitting balance - Comments: frequent falling backwards   Standing balance support: Bilateral upper extremity supported;During functional activity Standing balance-Leahy Scale: Poor Standing balance comment: fair/poor using RW                             Pertinent Vitals/Pain Pain Assessment: No/denies pain    Home Living Family/patient expects to be discharged to:: Private residence Living Arrangements: Spouse/significant other Available Help at Discharge: Available 24 hours/day Type of Home: House Home Access: Stairs to enter     Home Layout: One level Home Equipment: Environmental consultant - 2 wheels;Wheelchair - manual;Cane - quad;Shower seat Additional Comments: Most information taken from previous hospital admission    Prior Function Level of Independence: Independent with assistive device(s);Needs assistance   Gait / Transfers Assistance Needed: short distanced household ambulator with RW, "per patient"  ADL's / Homemaking Assistance Needed: assisted by family  Hand Dominance   Dominant Hand: Right    Extremity/Trunk  Assessment   Upper Extremity Assessment Upper Extremity Assessment: Generalized weakness    Lower Extremity Assessment Lower Extremity Assessment: Generalized weakness    Cervical / Trunk Assessment Cervical / Trunk Assessment: Kyphotic  Communication   Communication: No difficulties  Cognition Arousal/Alertness: Awake/alert Behavior During Therapy: Anxious;WFL for tasks assessed/performed Overall Cognitive Status: History of cognitive impairments - at baseline                                 General Comments: required occasional repeated verbal/tactile cueing, but cooperative      General Comments      Exercises     Assessment/Plan    PT Assessment Patient needs continued PT services  PT Problem List Decreased strength;Decreased activity tolerance;Decreased balance;Decreased mobility       PT Treatment Interventions Therapeutic exercise;Gait training;Stair training;Functional mobility training;Therapeutic activities;Patient/family education    PT Goals (Current goals can be found in the Care Plan section)  Acute Rehab PT Goals Patient Stated Goal: return home with family to assist PT Goal Formulation: With patient Time For Goal Achievement: 06/03/18 Potential to Achieve Goals: Good    Frequency Min 3X/week   Barriers to discharge        Co-evaluation               AM-PAC PT "6 Clicks" Mobility  Outcome Measure Help needed turning from your back to your side while in a flat bed without using bedrails?: A Lot Help needed moving from lying on your back to sitting on the side of a flat bed without using bedrails?: A Lot Help needed moving to and from a bed to a chair (including a wheelchair)?: A Lot Help needed standing up from a chair using your arms (e.g., wheelchair or bedside chair)?: A Lot Help needed to walk in hospital room?: Total Help needed climbing 3-5 steps with a railing? : Total 6 Click Score: 10    End of Session    Activity Tolerance: Patient tolerated treatment well;Patient limited by fatigue Patient left: in chair;with call bell/phone within reach;with chair alarm set Nurse Communication: Mobility status PT Visit Diagnosis: Unsteadiness on feet (R26.81);Other abnormalities of gait and mobility (R26.89);Muscle weakness (generalized) (M62.81)    Time: 3817-7116 PT Time Calculation (min) (ACUTE ONLY): 31 min   Charges:   PT Evaluation $PT Eval Moderate Complexity: 1 Mod PT Treatments $Therapeutic Activity: 23-37 mins        1:00 PM, 05/27/18 Lonell Grandchild, MPT Physical Therapist with [facility] [phone] office [phone] mobile phone

## 2018-05-27 NOTE — Progress Notes (Signed)
Initial Nutrition Assessment  DOCUMENTATION CODES:  Not applicable  INTERVENTION:  Downgrade diet textures to D3  Will remove beef/shelfish from diet.   Boost Breeze po BID, each supplement provides 250 kcal and 9 grams of protein  Appreciate nursing feeding assistance at meals  NUTRITION DIAGNOSIS:  Increased nutrient needs related to maintenence of Lean body mass in the elderly) as evidenced by the nutrition guidelines for this patient population.  GOAL:  Patient will meet greater than or equal to 90% of their needs  MONITOR:  PO intake, Supplement acceptance, Weight trends, Labs, I & O's  REASON FOR ASSESSMENT:  Consult Assessment of nutrition requirement/status  ASSESSMENT:  85 y/o female PMHx OA, CAD s/p CABG, COPD, HTN, HLD, Dementia, CVA. Presents from home after several episodes of bloody bowel movements. Had recently started xaerlto. Hgb 10.8 from baseline of 13 on admission. Admitted for management of acute GIB.   RD operating remotely d/t COVID precautions. Pt has dementia and reportedly lives with family and receives 24hr support from them. Called and spoke with pt daughter.   Daughter clarifies that pt lives in her own house, but she has caregivers there 24 hrs a day. The patients oral intake varies as expected with dementia. The patient is capable of feeding herself if foods are cut up, though sometimes she simply will not do it. Pt sundowns at night and it is difficult her to eat in evenings. She also will sometimes take a lot of coaxing to eat and may hold food in her mouth. She does not eat beef as the family has identified this as a trigger for the pts gout. Similarly, pt favors sugary items, but the family tries to limit these d/t her gout. Pt mostly drinks water, decaf coffee and occasionally apple juice. She does not eat shellfish d/t allergy. She does not drink any oral supplements. Family has tried patient on ensure before, but she will not accept it. She takes  a B vitamin and a preservision AREDS. She says the patient has recently started spitting out these medications and she envisions them switching to liquid vitamins soon.   Daughter reports pt has chronic issues with diarrhea which she attributes to her diverticulosis. She goes through bouts of diarrhea intermittently. Has not had issues with constipation.   Weight wise, daughter reports pts UBW is 120-125 lbs. Chart shows that pts weight has been stable @120 -125 for the past 3 years.   Will downgrade diet to chopped to facilitate self eating. Called and clarified that nursing is already aware that pt needs meal assistance. Will order boost breeze since the patient has a history of refusing the thicker ensures.   Labs: Hgb: 9.9 Meds: Namenda, ppi, aricept, IVF  Recent Labs  Lab 05/25/18 2030 05/25/18 2335 05/26/18 0907 05/27/18 0347  NA 138  --  140 141  K 3.9  --  4.1 3.6  CL 102  --  108 113*  CO2 26  --  24 24  BUN 25*  --  18 9  CREATININE 0.84  --  0.70 0.65  CALCIUM 8.9  --  8.4* 8.6*  MG  --  2.1  --   --   GLUCOSE 95  --  75 83   NUTRITION - FOCUSED PHYSICAL EXAM: Unable to conduct  Diet Order:   Diet Order            Diet regular Room service appropriate? Yes; Fluid consistency: Thin  Diet effective now  EDUCATION NEEDS:  Not appropriate for education at this time  Skin:  Skin Assessment: Reviewed RN Assessment  Last BM:  5/19  Height:  Ht Readings from Last 1 Encounters:  05/26/18 5\' 4"  (1.626 m)   Weight:  Wt Readings from Last 1 Encounters:  05/26/18 52.9 kg   Wt Readings from Last 10 Encounters:  05/26/18 52.9 kg  05/12/18 54.9 kg  04/27/18 54.4 kg  03/11/18 54.4 kg  12/26/17 54.4 kg  11/14/17 54 kg  05/27/17 54 kg  04/02/17 55.8 kg  03/10/17 56.7 kg  01/30/17 56.2 kg   Ideal Body Weight:  54.54 kg  BMI:  Body mass index is 20.02 kg/m.  Estimated Nutritional Needs:  Kcal:  1450-1600 (27-30 kcal/kg bw) Protein:  65-75  (1.2-1.4g/kg bw) Fluid:  1.4-1.6 L (25-30 ml/kg bw)   Burtis Junes RD, LDN, CNSC Clinical Nutrition Available Tues-Sat via Pager: [phone] 05/27/2018 12:35 PM

## 2018-05-27 NOTE — Plan of Care (Signed)
  Problem: Acute Rehab PT Goals(only PT should resolve) Goal: Pt Will Go Supine/Side To Sit Outcome: Progressing Flowsheets (Taken 05/27/2018 1302) Pt will go Supine/Side to Sit: with minimal assist Goal: Patient Will Transfer Sit To/From Stand Outcome: Progressing Flowsheets (Taken 05/27/2018 1302) Patient will transfer sit to/from stand: with moderate assist Goal: Pt Will Transfer Bed To Chair/Chair To Bed Outcome: Progressing Flowsheets (Taken 05/27/2018 1302) Pt will Transfer Bed to Chair/Chair to Bed: with mod assist Goal: Pt Will Ambulate Outcome: Progressing Flowsheets (Taken 05/27/2018 1302) Pt will Ambulate: 15 feet; with moderate assist; with rolling walker   1:03 PM, 05/27/18 Lonell Grandchild, MPT Physical Therapist with [facility] [phone] office [phone] mobile phone

## 2018-05-28 DIAGNOSIS — I2782 Chronic pulmonary embolism: Secondary | ICD-10-CM

## 2018-05-28 DIAGNOSIS — D649 Anemia, unspecified: Secondary | ICD-10-CM | POA: Diagnosis not present

## 2018-05-28 DIAGNOSIS — K922 Gastrointestinal hemorrhage, unspecified: Secondary | ICD-10-CM | POA: Diagnosis not present

## 2018-05-28 DIAGNOSIS — I10 Essential (primary) hypertension: Secondary | ICD-10-CM | POA: Diagnosis not present

## 2018-05-28 DIAGNOSIS — I2609 Other pulmonary embolism with acute cor pulmonale: Secondary | ICD-10-CM

## 2018-05-28 DIAGNOSIS — K219 Gastro-esophageal reflux disease without esophagitis: Secondary | ICD-10-CM | POA: Diagnosis not present

## 2018-05-28 DIAGNOSIS — M6281 Muscle weakness (generalized): Secondary | ICD-10-CM | POA: Diagnosis not present

## 2018-05-28 DIAGNOSIS — J449 Chronic obstructive pulmonary disease, unspecified: Secondary | ICD-10-CM | POA: Diagnosis not present

## 2018-05-28 DIAGNOSIS — Z720 Tobacco use: Secondary | ICD-10-CM

## 2018-05-28 LAB — HEMOGLOBIN AND HEMATOCRIT, BLOOD
HCT: 29.3 % — ABNORMAL LOW (ref 36.0–46.0)
Hemoglobin: 9.4 g/dL — ABNORMAL LOW (ref 12.0–15.0)

## 2018-05-28 NOTE — Discharge Summary (Addendum)
Physician Discharge Summary  [name] TIR:[id number] DOB: [birth year] DOA: 05/25/2018  PCP: Sharilyn Sites, MD  Admit date: 05/25/2018 Discharge date: 05/28/2018  Admitted From: Home  Disposition:  Home   Recommendations for Outpatient Follow-up and new medication changes:  1. Follow up with Dr. Hilma Favors in 7 days.  2. Rivaroxaban has been discontinued due to increased risk of bleeding. Patient has poor prognosis and poor quality of life, risk vs benefit decision was made to hold on anticoagulation or inferior vena cave filter. Her daughter is in agreement.  3. I spoke with her daughter Mrs. Manley and code has been change to DNR.   Home Health: yes   Equipment/Devices:   Discharge Condition: Stable  CODE STATUS: DNR  Diet recommendation: Regular  Brief/Interim Summary: 85 year old female who presented with rectal bleeding.  She does have significant past medical history for chronic anemia, osteoarthritis, asthma, coronary artery disease status post bypass grafting, she is status post pacemaker, chronic back pain, dementia, GERD, diverticulosis, dyslipidemia, hypertension and history of CVA.  Recent diagnosis of DVT involving the right lower extremity and placed on rivaroxaban for anticoagulation.  She was not able to provide detailed history due to her dementia.  On her initial physical examination her temperature was 97.8, heart rate 74, respiratory rate 17, blood pressure 132/72, oxygen saturation 94%.  She was ill looking appearing, frail and elderly, positive pallor, moist mucous membranes, lungs with decreased breath sounds at bases, heart S1-S2 present and rhythmic, abdomen was soft nontender, no lower extremity edema.  She was oriented only to self.  Sodium 138, potassium 3.9, chloride 102, bicarb 26, glucose 95, BUN 25, creatinine 0.84, white count 12.8, hemoglobin 10.8, hematocrit 33.5, platelets 320.  SARS COVID-19 negative.  Urinalysis negative for infection.  CT of the abdomen with  severe diverticulosis of the sigmoid and lesser extent descending colon.  No active inflammation.  Chest x-ray, left rotation, large pulmonary artery, no infiltrates.  Pacer and sternotomy wires in place.   Patient was admitted to the hospital with the working diagnosis of acute blood loss anemia due to lower gastrointestinal bleeding.  1.  Acute blood loss anemia due to lower gastrointestinal bleeding/diverticular bleed.  Patient was admitted to the stepdown unit, she had frequent H&H monitoring, anticoagulation was held.  She remained hemodynamically stable, did not required PRBC transfusion.  Her discharge hemoglobin is 9.4, hematocrit 29.3.  Patient was seen by gastroenterology, no indication for endoscopic procedure at this point.  2.  Recent right lower extremity deep vein thrombosis (May 12, 2018).  Anticoagulation was held, repeat ultrasonography of the lower extremities was negative for deep vein thrombosis.  Patient's oxygenation was 97% on room air, no chest pain.  Further work-up with echocardiography showed a right ventricle with normal systolic function.  Left ventricle with preserved ejection fraction, more than 65%.  Patient's health has been deteriorating, poor quality of life related to advanced dementia.  Risk versus benefit decision was made to hold on further anticoagulation.  3.  Dementia with encephalopathy.  Patient remains confused and disoriented, no agitation, she was placed on mittens for safety.  Continue buspirone, memantine and donepezil.  Patient was seen by physical therapy with recommendations for home health services.  4.  Tobacco abuse.  Continue smoking cessation, she received nicotine patch while hospitalized.  5.  Hypothyroidism.  Continue levothyroxine.  Discharge Diagnoses:  Principal Problem:   Lower GI bleed Active Problems:   Hyperlipidemia   Tobacco abuse   GERD (gastroesophageal reflux disease)  Essential hypertension   Anemia    Hypothyroidism    Discharge Instructions   Allergies as of 05/28/2018      Reactions   Aleve [naproxen Sodium]    unknown   Ativan [lorazepam]    Aggressive, hallucination   Cephalexin Hives   Contrast Media [iodinated Diagnostic Agents]    Unknown   Famvir [famciclovir] Other (See Comments)   unknown   Haldol [haloperidol Lactate]    Aggressive, hallucination   Iohexol    Urticaria, emesis, numbness during IVP performed at [facility] years ago   Ketek [telithromycin]    Penicillins Hives   Has patient had a PCN reaction causing immediate rash, facial/tongue/throat swelling, SOB or lightheadedness with hypotension: Yes Has patient had a PCN reaction causing severe rash involving mucus membranes or skin necrosis: No Has patient had a PCN reaction that required hospitalization NoNo Has patient had a PCN reaction occurring within the last 10 years: No If all of the above answers are "NO", then may proceed with Cephalosporin use.   Shellfish Allergy Other (See Comments)   Unknown   Sulfa Antibiotics Hives      Medication List    STOP taking these medications   Rivaroxaban 15 & 20 MG Tbpk     TAKE these medications   acetaminophen 325 MG tablet Commonly known as:  TYLENOL Take 650 mg by mouth every 6 (six) hours as needed for pain. For pain   allopurinol 100 MG tablet Commonly known as:  ZYLOPRIM Take 1 tablet by mouth daily.   busPIRone 5 MG tablet Commonly known as:  BUSPAR Take 5 mg by mouth 2 (two) times daily.   donepezil 10 MG tablet Commonly known as:  ARICEPT Take 10 mg by mouth at bedtime.   levocetirizine 5 MG tablet Commonly known as:  XYZAL Take 5 mg by mouth every morning.   levothyroxine 50 MCG tablet Commonly known as:  SYNTHROID Take 50 mcg by mouth every morning.   memantine 5 MG tablet Commonly known as:  NAMENDA Take 5 mg by mouth 2 (two) times daily.   nitroGLYCERIN 0.4 MG SL tablet Commonly known as:  NITROSTAT PLACE ONE  TABLET UNDER THE TONGUE EVERY 5 MINUTES AS NEEDED FOR CHESTPAIN DO NOT EXCEED 3 IN 24 HOURS What changed:  See the new instructions.   omeprazole 20 MG capsule Commonly known as:  PRILOSEC Take 2 capsules (40 mg total) by mouth daily. What changed:    how much to take  when to take this   PreserVision AREDS 2+Multi Vit Caps Take 1 capsule by mouth 2 (two) times a day.   VITAMIN B-12 CR PO Take 1 tablet by mouth every evening.            Durable Medical Equipment  (From admission, onward)         Start     Ordered   05/26/18 1347  For home use only DME Hospital bed  Once    Comments:  Unsteady Gait/Weakness----length of need=99 months  Question Answer Comment  Patient has (list medical condition): Unsteady Gait/Weakness----length of need=99 months   The above medical condition requires: Patient requires the ability to reposition frequently   Head must be elevated greater than: 30 degrees   Bed type Semi-electric      05/26/18 1347          Allergies  Allergen Reactions  . Aleve [Naproxen Sodium]     unknown  . Ativan [Lorazepam]  Aggressive, hallucination  . Cephalexin Hives  . Contrast Media [Iodinated Diagnostic Agents]     Unknown  . Famvir [Famciclovir] Other (See Comments)    unknown  . Haldol [Haloperidol Lactate]     Aggressive, hallucination  . Iohexol     Urticaria, emesis, numbness during IVP performed at [facility] years ago  . Ketek [Telithromycin]   . Penicillins Hives    Has patient had a PCN reaction causing immediate rash, facial/tongue/throat swelling, SOB or lightheadedness with hypotension: Yes Has patient had a PCN reaction causing severe rash involving mucus membranes or skin necrosis: No Has patient had a PCN reaction that required hospitalization NoNo Has patient had a PCN reaction occurring within the last 10 years: No If all of the above answers are "NO", then may proceed with Cephalosporin use.   . Shellfish  Allergy Other (See Comments)    Unknown  . Sulfa Antibiotics Hives    Consultations:  GI    Procedures/Studies: Ct Abdomen Pelvis Wo Contrast  Result Date: 05/25/2018 CLINICAL DATA:  85 year old female with bloody stool, chest pain. On Xarelto. Minor head trauma. EXAM: CT ABDOMEN AND PELVIS WITHOUT CONTRAST TECHNIQUE: Multidetector CT imaging of the abdomen and pelvis was performed following the standard protocol without IV contrast. COMPARISON:  07/02/2005. FINDINGS: Lower chest: Mild cardiomegaly. No pericardial effusion. Cardiac pacemaker leads. Negative lung bases aside from mild lingula atelectasis. Hepatobiliary: Chronic benign liver cyst at the dome has been present since 2007. Otherwise negative noncontrast liver and gallbladder. Pancreas: Negative noncontrast pancreas. Spleen: Negative. Adrenals/Urinary Tract: Negative adrenal glands. Negative noncontrast kidneys. Decompressed proximal ureters. Unremarkable urinary bladder. Incidental pelvic phleboliths. Stomach/Bowel: Gas and stool in the rectum with no inflammation. Severe diverticulosis of the sigmoid colon, no active inflammation identified. Lesser diverticulosis of the distal descending colon with no active inflammation. Redundant but nondilated transverse colon. Negative right colon, appendix, and terminal ileum. No dilated small bowel. No mesenteric stranding negative stomach. No free air, free fluid. Vascular/Lymphatic: Vascular patency is not evaluated in the absence of IV contrast. Extensive Aortoiliac calcified atherosclerosis. No lymphadenopathy. Reproductive: Surgically absent uterus and diminutive or absent ovaries. Other: No pelvic free fluid. Mild presacral stranding appears stable since 2007. Musculoskeletal: Prior sternotomy. Chronic appearing posterior 11th and left 12th rib fractures. No acute osseous abnormality identified. IMPRESSION: 1. Severe diverticulosis of the sigmoid, and to a lesser extent descending colon. No  active inflammation identified, but consider a diverticular source of lower GI bleeding. 2. No acute or inflammatory process identified in the non-contrast abdomen or pelvis. Electronically Signed   By: Genevie Ann M.D.   On: 05/25/2018 22:31   Dg Tibia/fibula Right  Result Date: 05/12/2018 CLINICAL DATA:  Right foot swelling.  Recent fall. EXAM: RIGHT TIBIA AND FIBULA - 2 VIEW COMPARISON:  None. FINDINGS: Mild spurring of the tibial spine and marginal spurring in the lateral compartment of the knee. Abnormal soft tissue swelling overlying the lateral malleolus and to a lesser extent the medial malleolus, without underlying malleolar fracture identified. Subcutaneous edema along the calf, although less striking/confluent than that at the malleoli. Mild marginal spurring of the patella. No knee joint effusion identified. No discrete fracture of the tibia or fibula identified. IMPRESSION: 1. Subcutaneous edema in the calf with more confluent subcutaneous edema overlying the malleoli, lateral greater than medial. No appreciable underlying fracture. Electronically Signed   By: Van Clines M.D.   On: 05/12/2018 18:33   Ct Head Wo Contrast  Result Date: 05/25/2018 CLINICAL DATA:  85 year old female with bloody stool, chest pain. On Xarelto. Minor head trauma. EXAM: CT HEAD WITHOUT CONTRAST TECHNIQUE: Contiguous axial images were obtained from the base of the skull through the vertex without intravenous contrast. COMPARISON:  Head CT 11/14/2017 and earlier. FINDINGS: Brain: Stable cerebral volume, ventricle size and configuration. Right hemisphere encephalomalacia at the posterior operculum and posterior sylvian fissure. Confluent superimposed bilateral white matter hypodensity. Stable gray-white matter differentiation throughout the brain. No midline shift, mass effect, evidence of mass lesion, intracranial hemorrhage or evidence of cortically based acute infarction. Vascular: Calcified atherosclerosis at the  skull base. No suspicious intracranial vascular hyperdensity. Skull: Stable. Sinuses/Orbits: Visualized paranasal sinuses and mastoids are stable and well pneumatized. Other: No acute orbit or scalp soft tissue finding. IMPRESSION: 1.  Stable non contrast CT appearance of the brain since 2019. 2. No acute intracranial abnormality or none she acute traumatic injury identified. Electronically Signed   By: Genevie Ann M.D.   On: 05/25/2018 22:26   US Venous Img Lower Bilateral  Result Date: 05/26/2018 CLINICAL DATA:  Follow-up known DVT involving the right femoral, popliteal and calf veins. Patient is currently on anticoagulation. Evaluate for acute or chronic DVT. EXAM: BILATERAL LOWER EXTREMITY VENOUS DOPPLER ULTRASOUND TECHNIQUE: Gray-scale sonography with graded compression, as well as color Doppler and duplex ultrasound were performed to evaluate the lower extremity deep venous systems from the level of the common femoral vein and including the common femoral, femoral, profunda femoral, popliteal and calf veins including the posterior tibial, peroneal and gastrocnemius veins when visible. The superficial great saphenous vein was also interrogated. Spectral Doppler was utilized to evaluate flow at rest and with distal augmentation maneuvers in the common femoral, femoral and popliteal veins. COMPARISON:  Right lower extremity venous Doppler ultrasound-05/12/2018 FINDINGS: RIGHT LOWER EXTREMITY Common Femoral Vein: No evidence of thrombus. Normal compressibility, respiratory phasicity and response to augmentation. Saphenofemoral Junction: No evidence of thrombus. Normal compressibility and flow on color Doppler imaging. Profunda Femoral Vein: No evidence of thrombus. Normal compressibility and flow on color Doppler imaging. Femoral Vein: The right femoral vein appears partially duplicated at its mid and distal aspect however both vessels appear patent without evidence of acute or chronic DVT. Normal compressibility,  respiratory phasicity and response to augmentation. Popliteal Vein: No evidence of acute or chronic thrombus. Normal compressibility, respiratory phasicity and response to augmentation. Calf Veins: Not well visualized Superficial Great Saphenous Vein: No evidence of thrombus. Normal compressibility. Venous Reflux:  None. Other Findings:  None. LEFT LOWER EXTREMITY Common Femoral Vein: No evidence of thrombus. Normal compressibility, respiratory phasicity and response to augmentation. Saphenofemoral Junction: No evidence of thrombus. Normal compressibility and flow on color Doppler imaging. Profunda Femoral Vein: No evidence of thrombus. Normal compressibility and flow on color Doppler imaging. Femoral Vein: No evidence of thrombus. Normal compressibility, respiratory phasicity and response to augmentation. Popliteal Vein: No evidence of thrombus. Normal compressibility, respiratory phasicity and response to augmentation. Calf Veins: Not well visualized though appear patent where imaged. Superficial Great Saphenous Vein: No evidence of thrombus. Normal compressibility. Venous Reflux:  None. Other Findings:  None. IMPRESSION: No evidence of acute or chronic DVT within either lower extremity with special attention paid to the right femoral and popliteal veins. Electronically Signed   By: Sandi Mariscal M.D.   On: 05/26/2018 16:14   US Venous Img Lower Right (dvt Study)  Result Date: 05/12/2018 CLINICAL DATA:  Left foot and leg edema EXAM: LEFT LOWER EXTREMITY VENOUS DOPPLER ULTRASOUND TECHNIQUE: Gray-scale sonography with graded compression,  as well as color Doppler and duplex ultrasound were performed to evaluate the lower extremity deep venous systems from the level of the common femoral vein and including the common femoral, femoral, profunda femoral, popliteal and calf veins including the posterior tibial, peroneal and gastrocnemius veins when visible. The superficial great saphenous vein was also interrogated.  Spectral Doppler was utilized to evaluate flow at rest and with distal augmentation maneuvers in the common femoral, femoral and popliteal veins. COMPARISON:  None. FINDINGS: Contralateral Common Femoral Vein: Respiratory phasicity is normal and symmetric with the symptomatic side. No evidence of thrombus. Normal compressibility. Common Femoral Vein: No evidence of thrombus. Normal compressibility, respiratory phasicity and response to augmentation. Saphenofemoral Junction: No evidence of thrombus. Normal compressibility and flow on color Doppler imaging. Profunda Femoral Vein: No evidence of thrombus. Normal compressibility and flow on color Doppler imaging. Femoral Vein: Occlusive deep venous thrombosis without compressibility or phasicity. Popliteal Vein: Occlusive deep venous thrombosis without compressibility or phasicity. Calf Veins: Occlusive deep venous thrombosis without compressibility or phasicity. Superficial Great Saphenous Vein: No evidence of thrombus. Normal compressibility. Venous Reflux:  None. Other Findings:  None. IMPRESSION: Occlusive deep venous thrombosis involving the femoral vein, popliteal vein and the calf veins. Electronically Signed   By: Kathreen Devoid   On: 05/12/2018 18:13   Dg Chest Port 1 View  Result Date: 05/25/2018 CLINICAL DATA:  85 year old female with bloody stool, chest pain, on is a relative for DVT. EXAM: PORTABLE CHEST 1 VIEW COMPARISON:  11/14/2017 and earlier. FINDINGS: Portable AP upright view at 2037 hours. Prior CABG. No cardiomegaly. Stable cardiac size and mediastinal contours. Stable left chest pacemaker. Somewhat large lung volumes are stable. Allowing for portable technique the lungs are clear. Visualized tracheal air column is within normal limits. No pneumoperitoneum or bowel gas visible in the upper abdomen. IMPRESSION: No acute cardiopulmonary abnormality. Electronically Signed   By: Genevie Ann M.D.   On: 05/25/2018 21:05   Dg Foot Complete Right  Result  Date: 05/12/2018 CLINICAL DATA:  Right foot swelling starting on Sunday. Recent fall. EXAM: RIGHT FOOT COMPLETE - 3+ VIEW COMPARISON:  None. FINDINGS: Diffuse bony demineralization. Small plantar calcaneal spur. Dorsal subcutaneous edema along the forefoot. Degenerative articular space narrowing at the first metatarsophalangeal articulation. IMPRESSION: 1. No fracture is identified. There is dorsal subcutaneous edema in the forefoot. 2. Diffuse bony demineralization. 3. Degenerative articular space narrowing at the first MTP joint. 4. Small plantar calcaneal spur. Electronically Signed   By: Van Clines M.D.   On: 05/12/2018 18:30      Procedures:   Subjective: Patient is feeling well, continue to have disorientation. Denies any chest pain, dyspnea or abdominal pain.   Discharge Exam: Vitals:   05/27/18 2200 05/28/18 0400  BP: (!) 157/80 139/73  Pulse: 79 60  Resp: 16   Temp: 98.1 F (36.7 C) 98 F (36.7 C)  SpO2: 97% 96%   Vitals:   05/27/18 1944 05/27/18 2200 05/28/18 0400 05/28/18 0600  BP:  (!) 157/80 139/73   Pulse:  79 60   Resp:  16    Temp:  98.1 F (36.7 C) 98 F (36.7 C)   TempSrc:  Oral Oral   SpO2: 98% 97% 96%   Weight:    51.7 kg  Height:        General: Not in pain or dyspnea  Neurology: Awake and alert, non focal  E ENT: mild pallor, no icterus, oral mucosa moist Cardiovascular: No JVD. S1-S2 present, rhythmic, no  gallops, rubs, or murmurs. No lower extremity edema. Pulmonary: Positive breath sounds bilaterally, adequate air movement, no wheezing, rhonchi or rales. Gastrointestinal. Abdomen with no organomegaly, non tender, no rebound or guarding Skin. No rashes Musculoskeletal: no joint deformities   The results of significant diagnostics from this hospitalization (including imaging, microbiology, ancillary and laboratory) are listed below for reference.     Microbiology: Recent Results (from the past 240 hour(s))  SARS Coronavirus 2 (CEPHEID -  Performed in [place] hospital lab), Hosp Order     Status: None   Collection Time: 05/25/18  8:32 PM  Result Value Ref Range Status   SARS Coronavirus 2 NEGATIVE NEGATIVE Final    Comment: (NOTE) If result is NEGATIVE SARS-CoV-2 target nucleic acids are NOT DETECTED. The SARS-CoV-2 RNA is generally detectable in upper and lower  respiratory specimens during the acute phase of infection. The lowest  concentration of SARS-CoV-2 viral copies this assay can detect is 250  copies / mL. A negative result does not preclude SARS-CoV-2 infection  and should not be used as the sole basis for treatment or other  patient management decisions.  A negative result may occur with  improper specimen collection / handling, submission of specimen other  than nasopharyngeal swab, presence of viral mutation(s) within the  areas targeted by this assay, and inadequate number of viral copies  (<250 copies / mL). A negative result must be combined with clinical  observations, patient history, and epidemiological information. If result is POSITIVE SARS-CoV-2 target nucleic acids are DETECTED. The SARS-CoV-2 RNA is generally detectable in upper and lower  respiratory specimens dur ing the acute phase of infection.  Positive  results are indicative of active infection with SARS-CoV-2.  Clinical  correlation with patient history and other diagnostic information is  necessary to determine patient infection status.  Positive results do  not rule out bacterial infection or co-infection with other viruses. If result is PRESUMPTIVE POSTIVE SARS-CoV-2 nucleic acids MAY BE PRESENT.   A presumptive positive result was obtained on the submitted specimen  and confirmed on repeat testing.  While 2019 novel coronavirus  (SARS-CoV-2) nucleic acids may be present in the submitted sample  additional confirmatory testing may be necessary for epidemiological  and / or clinical management purposes  to differentiate between   SARS-CoV-2 and other Sarbecovirus currently known to infect humans.  If clinically indicated additional testing with an alternate test  methodology [phone]) is advised. The SARS-CoV-2 RNA is generally  detectable in upper and lower respiratory sp ecimens during the acute  phase of infection. The expected result is Negative. Fact Sheet for Patients:  StrictlyIdeas.no Fact Sheet for Healthcare Providers: BankingDealers.co.za This test is not yet approved or cleared by the [place] FDA and has been authorized for detection and/or diagnosis of SARS-CoV-2 by FDA under an Emergency Use Authorization (EUA).  This EUA will remain in effect (meaning this test can be used) for the duration of the COVID-19 declaration under Section 564(b)(1) of the Act, 21 U.S.C. section 360bbb-3(b)(1), unless the authorization is terminated or revoked sooner. Performed at [facility], [address], [place], [place] [address]   MRSA PCR Screening     Status: None   Collection Time: 05/26/18  1:34 AM  Result Value Ref Range Status   MRSA by PCR NEGATIVE NEGATIVE Final    Comment:        The GeneXpert MRSA Assay (FDA approved for NASAL specimens only), is one component of a comprehensive MRSA colonization surveillance  program. It is not intended to diagnose MRSA infection nor to guide or monitor treatment for MRSA infections. Performed at [facility], [address], [place], [place] [address]      Labs: BNP (last 3 results) No results for input(s): BNP in the last 8760 hours. Basic Metabolic Panel: Recent Labs  Lab 05/25/18 2030 05/25/18 2335 05/26/18 0907 05/27/18 0347  NA 138  --  140 141  K 3.9  --  4.1 3.6  CL 102  --  108 113*  CO2 26  --  24 24  GLUCOSE 95  --  75 83  BUN 25*  --  18 9  CREATININE 0.84  --  0.70 0.65  CALCIUM 8.9  --  8.4* 8.6*  MG  --  2.1  --   --    Liver Function Tests: Recent Labs  Lab 05/25/18 2030   AST 24  ALT 19  ALKPHOS 77  BILITOT 0.3  PROT 6.3*  ALBUMIN 3.5   Recent Labs  Lab 05/25/18 2030  LIPASE 33   No results for input(s): AMMONIA in the last 168 hours. CBC: Recent Labs  Lab 05/25/18 2030 05/26/18 0213 05/26/18 0907 05/26/18 1608 05/27/18 0347 05/28/18 0408  WBC 12.8*  --  9.1  --  9.1  --   HGB 10.8* 10.1* 9.3* 9.1* 9.9* 9.4*  HCT 33.5*  --  29.4* 28.6* 32.5* 29.3*  MCV 94.9  --  97.4  --  97.0  --   PLT 320  --  269  --  297  --    Cardiac Enzymes: Recent Labs  Lab 05/25/18 2030  TROPONINI <0.03   BNP: Invalid input(s): POCBNP CBG: No results for input(s): GLUCAP in the last 168 hours. D-Dimer No results for input(s): DDIMER in the last 72 hours. Hgb A1c No results for input(s): HGBA1C in the last 72 hours. Lipid Profile No results for input(s): CHOL, HDL, LDLCALC, TRIG, CHOLHDL, LDLDIRECT in the last 72 hours. Thyroid function studies No results for input(s): TSH, T4TOTAL, T3FREE, THYROIDAB in the last 72 hours.  Invalid input(s): FREET3 Anemia work up No results for input(s): VITAMINB12, FOLATE, FERRITIN, TIBC, IRON, RETICCTPCT in the last 72 hours. Urinalysis    Component Value Date/Time   COLORURINE YELLOW 05/25/2018 2301   APPEARANCEUR CLEAR 05/25/2018 2301   LABSPEC 1.027 05/25/2018 2301   PHURINE 5.0 05/25/2018 2301   GLUCOSEU NEGATIVE 05/25/2018 2301   HGBUR LARGE (A) 05/25/2018 2301   BILIRUBINUR NEGATIVE 05/25/2018 2301   KETONESUR 5 (A) 05/25/2018 2301   PROTEINUR 30 (A) 05/25/2018 2301   UROBILINOGEN 0.2 09/20/2013 1042   NITRITE NEGATIVE 05/25/2018 2301   LEUKOCYTESUR TRACE (A) 05/25/2018 2301   Sepsis Labs Invalid input(s): PROCALCITONIN,  WBC,  LACTICIDVEN Microbiology Recent Results (from the past 240 hour(s))  SARS Coronavirus 2 (CEPHEID - Performed in [place] hospital lab), Hosp Order     Status: None   Collection Time: 05/25/18  8:32 PM  Result Value Ref Range Status   SARS Coronavirus 2 NEGATIVE  NEGATIVE Final    Comment: (NOTE) If result is NEGATIVE SARS-CoV-2 target nucleic acids are NOT DETECTED. The SARS-CoV-2 RNA is generally detectable in upper and lower  respiratory specimens during the acute phase of infection. The lowest  concentration of SARS-CoV-2 viral copies this assay can detect is 250  copies / mL. A negative result does not preclude SARS-CoV-2 infection  and should not be used as the sole basis for treatment or other  patient  management decisions.  A negative result may occur with  improper specimen collection / handling, submission of specimen other  than nasopharyngeal swab, presence of viral mutation(s) within the  areas targeted by this assay, and inadequate number of viral copies  (<250 copies / mL). A negative result must be combined with clinical  observations, patient history, and epidemiological information. If result is POSITIVE SARS-CoV-2 target nucleic acids are DETECTED. The SARS-CoV-2 RNA is generally detectable in upper and lower  respiratory specimens dur ing the acute phase of infection.  Positive  results are indicative of active infection with SARS-CoV-2.  Clinical  correlation with patient history and other diagnostic information is  necessary to determine patient infection status.  Positive results do  not rule out bacterial infection or co-infection with other viruses. If result is PRESUMPTIVE POSTIVE SARS-CoV-2 nucleic acids MAY BE PRESENT.   A presumptive positive result was obtained on the submitted specimen  and confirmed on repeat testing.  While 2019 novel coronavirus  (SARS-CoV-2) nucleic acids may be present in the submitted sample  additional confirmatory testing may be necessary for epidemiological  and / or clinical management purposes  to differentiate between  SARS-CoV-2 and other Sarbecovirus currently known to infect humans.  If clinically indicated additional testing with an alternate test  methodology [phone]) is  advised. The SARS-CoV-2 RNA is generally  detectable in upper and lower respiratory sp ecimens during the acute  phase of infection. The expected result is Negative. Fact Sheet for Patients:  StrictlyIdeas.no Fact Sheet for Healthcare Providers: BankingDealers.co.za This test is not yet approved or cleared by the [place] FDA and has been authorized for detection and/or diagnosis of SARS-CoV-2 by FDA under an Emergency Use Authorization (EUA).  This EUA will remain in effect (meaning this test can be used) for the duration of the COVID-19 declaration under Section 564(b)(1) of the Act, 21 U.S.C. section 360bbb-3(b)(1), unless the authorization is terminated or revoked sooner. Performed at [facility], [address], [place], [place] [address]   MRSA PCR Screening     Status: None   Collection Time: 05/26/18  1:34 AM  Result Value Ref Range Status   MRSA by PCR NEGATIVE NEGATIVE Final    Comment:        The GeneXpert MRSA Assay (FDA approved for NASAL specimens only), is one component of a comprehensive MRSA colonization surveillance program. It is not intended to diagnose MRSA infection nor to guide or monitor treatment for MRSA infections. Performed at [facility], [address], [place],  [address]      Time coordinating discharge: 45 minutes  SIGNED:   Tawni Millers, MD  [facility] 05/28/2018, 7:52 AM

## 2018-05-28 NOTE — TOC Transition Note (Addendum)
Transition of Care [facility]) - CM/SW Discharge Note   Patient Details  Name: [name] MRN: [id number] Date of Birth: 1925-02-16  Transition of Care [facility]) CM/SW Contact:  Chloeann Alfred, Chauncey Reading, RN Phone Number: 05/28/2018, 9:08 AM   Clinical Narrative:   Patient discharging home today. Discussed home health with daughter. She is agreeable. Website for [facility] comparison not working, discussed options with daughter via phone.  No preference on providers, she thinks her mom has used [facility] in the past. Referral called to [place].  Hospital bed to be delivered today between 10-2.  Daughter planning to transport patient home today.  F/u Appt scheduled and discussed with daughter.No other TOC needs.        Barriers to Discharge: Continued Medical Work up   Patient Goals and CMS Choice Patient states their goals for this hospitalization and ongoing recovery are:: for patient to return home with home health CMS Medicare.gov Compare Post Acute Care list provided to:: Other (Comment Required)(daughter) Choice offered to / list presented to : Adult Children   Discharge Plan and Services     Post Acute Care Choice: Durable Medical Equipment          DME Arranged: Hospital bed DME Agency: [employer] Date DME Agency Contacted: 05/26/18 Time DME Agency Contacted: [id number] Representative spoke with at DME Agency: [place]: PT, RN, OT [facility] Agency: [facility] (now [facility]) Date [place]: 05/28/18 Time [place]: 0908 Representative spoke with at [place]: [place]    Readmission Risk Interventions Readmission Risk Prevention Plan 05/26/2018  Transportation Screening Complete  Some recent data might be hidden

## 2018-05-30 DIAGNOSIS — I2510 Atherosclerotic heart disease of native coronary artery without angina pectoris: Secondary | ICD-10-CM | POA: Diagnosis not present

## 2018-05-30 DIAGNOSIS — J45909 Unspecified asthma, uncomplicated: Secondary | ICD-10-CM | POA: Diagnosis not present

## 2018-05-30 DIAGNOSIS — I10 Essential (primary) hypertension: Secondary | ICD-10-CM | POA: Diagnosis not present

## 2018-05-30 DIAGNOSIS — E785 Hyperlipidemia, unspecified: Secondary | ICD-10-CM | POA: Diagnosis not present

## 2018-05-30 DIAGNOSIS — G8929 Other chronic pain: Secondary | ICD-10-CM | POA: Diagnosis not present

## 2018-05-30 DIAGNOSIS — K5730 Diverticulosis of large intestine without perforation or abscess without bleeding: Secondary | ICD-10-CM | POA: Diagnosis not present

## 2018-05-30 DIAGNOSIS — I82401 Acute embolism and thrombosis of unspecified deep veins of right lower extremity: Secondary | ICD-10-CM | POA: Diagnosis not present

## 2018-05-30 DIAGNOSIS — Z950 Presence of cardiac pacemaker: Secondary | ICD-10-CM | POA: Diagnosis not present

## 2018-05-30 DIAGNOSIS — Z87891 Personal history of nicotine dependence: Secondary | ICD-10-CM | POA: Diagnosis not present

## 2018-05-30 DIAGNOSIS — E039 Hypothyroidism, unspecified: Secondary | ICD-10-CM | POA: Diagnosis not present

## 2018-05-30 DIAGNOSIS — M1991 Primary osteoarthritis, unspecified site: Secondary | ICD-10-CM | POA: Diagnosis not present

## 2018-05-30 DIAGNOSIS — Z8673 Personal history of transient ischemic attack (TIA), and cerebral infarction without residual deficits: Secondary | ICD-10-CM | POA: Diagnosis not present

## 2018-05-30 DIAGNOSIS — K219 Gastro-esophageal reflux disease without esophagitis: Secondary | ICD-10-CM | POA: Diagnosis not present

## 2018-05-30 DIAGNOSIS — D500 Iron deficiency anemia secondary to blood loss (chronic): Secondary | ICD-10-CM | POA: Diagnosis not present

## 2018-05-30 DIAGNOSIS — Z9181 History of falling: Secondary | ICD-10-CM | POA: Diagnosis not present

## 2018-06-02 ENCOUNTER — Emergency Department (HOSPITAL_COMMUNITY)
Admission: EM | Admit: 2018-06-02 | Discharge: 2018-06-02 | Disposition: A | Discharge status: Home / Self Care | Payer: Medicare Other | Attending: Emergency Medicine | Admitting: Emergency Medicine

## 2018-06-02 ENCOUNTER — Other Ambulatory Visit: Payer: Self-pay

## 2018-06-02 ENCOUNTER — Emergency Department (HOSPITAL_COMMUNITY): Payer: Medicare Other

## 2018-06-02 ENCOUNTER — Encounter (HOSPITAL_COMMUNITY): Payer: Self-pay | Admitting: Emergency Medicine

## 2018-06-02 ENCOUNTER — Encounter: Payer: Medicare Other | Admitting: Internal Medicine

## 2018-06-02 DIAGNOSIS — Z79899 Other long term (current) drug therapy: Secondary | ICD-10-CM | POA: Diagnosis not present

## 2018-06-02 DIAGNOSIS — R0902 Hypoxemia: Secondary | ICD-10-CM | POA: Diagnosis not present

## 2018-06-02 DIAGNOSIS — Z951 Presence of aortocoronary bypass graft: Secondary | ICD-10-CM | POA: Insufficient documentation

## 2018-06-02 DIAGNOSIS — Z87891 Personal history of nicotine dependence: Secondary | ICD-10-CM | POA: Insufficient documentation

## 2018-06-02 DIAGNOSIS — J449 Chronic obstructive pulmonary disease, unspecified: Secondary | ICD-10-CM | POA: Diagnosis not present

## 2018-06-02 DIAGNOSIS — K922 Gastrointestinal hemorrhage, unspecified: Secondary | ICD-10-CM | POA: Diagnosis not present

## 2018-06-02 DIAGNOSIS — I10 Essential (primary) hypertension: Secondary | ICD-10-CM | POA: Diagnosis not present

## 2018-06-02 DIAGNOSIS — R1084 Generalized abdominal pain: Secondary | ICD-10-CM | POA: Diagnosis not present

## 2018-06-02 DIAGNOSIS — J45909 Unspecified asthma, uncomplicated: Secondary | ICD-10-CM | POA: Diagnosis not present

## 2018-06-02 DIAGNOSIS — I2510 Atherosclerotic heart disease of native coronary artery without angina pectoris: Secondary | ICD-10-CM | POA: Insufficient documentation

## 2018-06-02 DIAGNOSIS — Z950 Presence of cardiac pacemaker: Secondary | ICD-10-CM | POA: Insufficient documentation

## 2018-06-02 DIAGNOSIS — R410 Disorientation, unspecified: Secondary | ICD-10-CM | POA: Diagnosis not present

## 2018-06-02 DIAGNOSIS — F0390 Unspecified dementia without behavioral disturbance: Secondary | ICD-10-CM | POA: Diagnosis not present

## 2018-06-02 DIAGNOSIS — R109 Unspecified abdominal pain: Secondary | ICD-10-CM | POA: Insufficient documentation

## 2018-06-02 DIAGNOSIS — Z8673 Personal history of transient ischemic attack (TIA), and cerebral infarction without residual deficits: Secondary | ICD-10-CM | POA: Insufficient documentation

## 2018-06-02 DIAGNOSIS — R079 Chest pain, unspecified: Secondary | ICD-10-CM | POA: Insufficient documentation

## 2018-06-02 DIAGNOSIS — I499 Cardiac arrhythmia, unspecified: Secondary | ICD-10-CM | POA: Diagnosis not present

## 2018-06-02 LAB — CBC
HCT: 34.9 % — ABNORMAL LOW (ref 36.0–46.0)
Hemoglobin: 11 g/dL — ABNORMAL LOW (ref 12.0–15.0)
MCH: 30.4 pg (ref 26.0–34.0)
MCHC: 31.5 g/dL (ref 30.0–36.0)
MCV: 96.4 fL (ref 80.0–100.0)
Platelets: 301 10*3/uL (ref 150–400)
RBC: 3.62 MIL/uL — ABNORMAL LOW (ref 3.87–5.11)
RDW: 14.9 % (ref 11.5–15.5)
WBC: 7.2 10*3/uL (ref 4.0–10.5)
nRBC: 0 % (ref 0.0–0.2)

## 2018-06-02 LAB — COMPREHENSIVE METABOLIC PANEL
ALT: 14 U/L (ref 0–44)
AST: 23 U/L (ref 15–41)
Albumin: 3.4 g/dL — ABNORMAL LOW (ref 3.5–5.0)
Alkaline Phosphatase: 67 U/L (ref 38–126)
Anion gap: 8 (ref 5–15)
BUN: 10 mg/dL (ref 8–23)
CO2: 28 mmol/L (ref 22–32)
Calcium: 8.9 mg/dL (ref 8.9–10.3)
Chloride: 108 mmol/L (ref 98–111)
Creatinine, Ser: 0.77 mg/dL (ref 0.44–1.00)
GFR calc Af Amer: >60 mL/min (ref >60)
GFR calc non Af Amer: >60 mL/min (ref >60)
Glucose, Bld: 107 mg/dL — ABNORMAL HIGH (ref 70–99)
Potassium: 3.5 mmol/L (ref 3.5–5.1)
Sodium: 144 mmol/L (ref 135–145)
Total Bilirubin: 0.7 mg/dL (ref 0.3–1.2)
Total Protein: 6.1 g/dL — ABNORMAL LOW (ref 6.5–8.1)

## 2018-06-02 LAB — LACTIC ACID, PLASMA: Lactic Acid, Venous: 1.6 mmol/L (ref 0.5–1.9)

## 2018-06-02 LAB — LIPASE, BLOOD: Lipase: 28 U/L (ref 11–51)

## 2018-06-02 LAB — TROPONIN I
Troponin I: <0.03 ng/mL (ref <0.03)
Troponin I: <0.03 ng/mL (ref <0.03)

## 2018-06-02 MED ORDER — IOHEXOL 300 MG/ML  SOLN: 100.0000 mL | Freq: Once | INTRAMUSCULAR | Status: DC | PRN

## 2018-06-02 MED ORDER — SODIUM CHLORIDE 0.9 % IV BOLUS
1000.0000 mL | Freq: Once | INTRAVENOUS | Status: AC
  Administered 2018-06-02: 1000 mL via INTRAVENOUS

## 2018-06-02 MED ORDER — HYDROCORTISONE NA SUCCINATE PF 100 MG IJ SOLR
200.0000 mg | Freq: Once | INTRAMUSCULAR | Status: AC
  Administered 2018-06-02: 200 mg via INTRAVENOUS
  Filled 2018-06-02: qty 4

## 2018-06-02 MED ORDER — ACETAMINOPHEN 500 MG PO TABS
1000.0000 mg | ORAL_TABLET | Freq: Once | ORAL | Status: AC
  Administered 2018-06-02: 1000 mg via ORAL
  Filled 2018-06-02: qty 2

## 2018-06-02 MED ORDER — DIPHENHYDRAMINE HCL 50 MG/ML IJ SOLN
50.0000 mg | Freq: Once | INTRAMUSCULAR | Status: AC
  Administered 2018-06-02: 50 mg via INTRAVENOUS
  Filled 2018-06-02: qty 1

## 2018-06-02 MED ORDER — IOHEXOL 350 MG/ML SOLN
100.0000 mL | Freq: Once | INTRAVENOUS | Status: AC | PRN
  Administered 2018-06-02: 100 mL via INTRAVENOUS

## 2018-06-02 NOTE — ED Triage Notes (Signed)
Pt c/o cp x 1 hour. Pt also reports generalized pain all over. Pt has dementia and lives at home with help from aid and daughter.

## 2018-06-02 NOTE — ED Notes (Signed)
Dr Bero at bedside.  

## 2018-06-02 NOTE — ED Notes (Signed)
Pt cleaned and changed  

## 2018-06-02 NOTE — ED Notes (Signed)
Called Daughter Vivi Martens and reviewed discharge instructions.

## 2018-06-02 NOTE — ED Provider Notes (Signed)
[facility] Emergency Department Provider Note MRN:  [id number]  Arrival date & time: 06/02/18     Chief Complaint   Chest Pain   History of Present Illness   [name] is a 85 y.o. year-old female with a history of CAD, CABG, COPD, dementia presenting to the ED with chief complaint of chest pain.  Endorsing central chest pain 1 hour prior to arrival.  On arrival now endorsing diffuse abdominal pain.  Patient's daughter denies any other symptoms such as vomiting, diarrhea, fever, cough.  I was unable to obtain an accurate HPI, PMH, or ROS due to the patient's dementia.  Review of Systems  Positive for chest pain, abdominal pain.  Patient's Health History    Past Medical History:  Diagnosis Date  . Anemia   . Arthritis    "fingers; back" (02/09/2013)  . Asthma   . CAD (coronary artery disease)    CABG surgery in 06/2005 for left main disease; normal EF; negative stress nuclear 08/2008  . Cerebrovascular disease    1993-CVA; 08/2008-mild atherosclerosis without focal stenosis  . Chronic back pain greater than 3 months duration    "nerve damage down into left leg" (02/09/2013)  . Chronic bronchitis ([place])    "haven't had it in last 2-3 years; used to get it q yr" (02/09/2013)  . COPD (chronic obstructive pulmonary disease) ([place])   . Dementia ([place])    "more than mild; having more short term memory loss recently; on Aricept" (02/09/2013)  . Diverticulosis   . Dizziness    chronic  . Family history of anesthesia complication    "makes daughter sick" (02/09/2013)  . Frequent PVCs   . Frequent UTI   . GERD (gastroesophageal reflux disease)   . TIWPYKDX(833.8)    "sometimes weekly" (02/09/2013)  . High cholesterol   . Hypertension   . Mental disorder    mild dementia  . Orthostatic hypotension   . Osteopenia   . Pneumonia    "couple times; several years ago" (02/09/2013)  . Sinus bradycardia    s/p STJ Assurity dual chamber pacemaker by Dr Rayann Heman 02/2013  .  Stroke ([place])    20-30 yrs ago  . Syncope     Negative even recorder dated 3/08  . Tobacco abuse    60 pack years; continuing    Past Surgical History:  Procedure Laterality Date  . ABDOMINAL HYSTERECTOMY  1990  . APPENDECTOMY    . CARDIAC CATHETERIZATION  2007  . CATARACT EXTRACTION, BILATERAL    . COLONOSCOPY  10/10/2011   Procedure: COLONOSCOPY;  Surgeon: Rogene Houston, MD;  [facility];  Service: Endoscopy;  Laterality: N/A;  1230  . COLONOSCOPY N/A 04/12/2015   Procedure: COLONOSCOPY;  Surgeon: Rogene Houston, MD;  [facility];  Service: Endoscopy;  Laterality: N/A;  . CORONARY ARTERY BYPASS GRAFT  2007   Left main disease  . ESOPHAGOGASTRODUODENOSCOPY  10/10/2011   Procedure: ESOPHAGOGASTRODUODENOSCOPY (EGD);  Surgeon: Rogene Houston, MD;  [facility];  Service: Endoscopy;  Laterality: N/A;  . ESOPHAGOGASTRODUODENOSCOPY N/A 04/08/2015   Procedure: ESOPHAGOGASTRODUODENOSCOPY (EGD);  Surgeon: Rogene Houston, MD;  [facility];  Service: Endoscopy;  Laterality: N/A;  . HERNIA REPAIR    . PACEMAKER INSERTION  02/09/2013   STJ Assurity pacemaker implanted by Dr Rayann Heman for symptomatic bradycardia  . PERMANENT PACEMAKER INSERTION N/A 02/09/2013   Procedure: PERMANENT PACEMAKER INSERTION;  Surgeon: Coralyn Mark, MD;  Location: [facility]  CATH LAB;  Service: Cardiovascular;  Laterality: N/A;  . TONSILLECTOMY    . VENTRAL HERNIA REPAIR      Family History  Problem Relation Age of Onset  . Angina Mother   . Heart attack Mother   . Heart disease Other   . Arthritis Other   . Lung disease Other   . Asthma Other     Social History   Socioeconomic History  . Marital status: Married    Spouse name: Percell Miller   . Number of children: Not on file  . Years of education: 34  . Highest education level: Not on file  Occupational History  . Occupation: retired  Scientific laboratory technician  . Financial resource strain: Not on file  . Food insecurity:    Worry: Not  on file    Inability: Not on file  . Transportation needs:    Medical: Not on file    Non-medical: Not on file  Tobacco Use  . Smoking status: Former Smoker    Packs/day: 0.50    Years: 60.00    Pack years: 30.00    Types: Cigarettes    Start date: 12/12/1952    Last attempt to quit: 10/20/2006    Years since quitting: 11.6  . Smokeless tobacco: Never Used  . Tobacco comment: 02/09/2013 "quit smoking in ~ 2009"  Substance and Sexual Activity  . Alcohol use: No    Alcohol/week: 0.0 standard drinks  . Drug use: No  . Sexual activity: Never  Lifestyle  . Physical activity:    Days per week: Not on file    Minutes per session: Not on file  . Stress: Not on file  Relationships  . Social connections:    Talks on phone: Not on file    Gets together: Not on file    Attends religious service: Not on file    Active member of club or organization: Not on file    Attends meetings of clubs or organizations: Not on file    Relationship status: Not on file  . Intimate partner violence:    Fear of current or ex partner: Not on file    Emotionally abused: Not on file    Physically abused: Not on file    Forced sexual activity: Not on file  Other Topics Concern  . Not on file  Social History Narrative   3 children    Right handed   HS    Drinks decaff     Physical Exam  Vital Signs and Nursing Notes reviewed Vitals:   06/02/18 1700 06/02/18 1730  BP: (!) 181/81 (!) 179/83  Pulse: 62 63  Resp: (!) 28 19  Temp:    SpO2: 98% 100%    CONSTITUTIONAL: Well-appearing, NAD NEURO:  Alert, oriented to name only, moving all extremities EYES:  eyes equal and reactive ENT/NECK:  no LAD, no JVD CARDIO: Regular rate, well-perfused, normal S1 and S2 PULM:  CTAB no wheezing or rhonchi GI/GU:  normal bowel sounds, non-distended, non-tender MSK/SPINE:  No gross deformities, no edema SKIN:  no rash, atraumatic PSYCH:  Appropriate speech and behavior  Diagnostic and Interventional Summary     EKG Interpretation  Date/Time:  Tuesday Jun 02 2018 12:43:27 EDT Ventricular Rate:  64 PR Interval:    QRS Duration: 82 QT Interval:  404 QTC Calculation: 417 R Axis:   96 Text Interpretation:  Sinus rhythm Probable lateral infarct, age indeterminate Confirmed by Gerlene Fee [phone]) on 06/02/2018 2:31:54 PM  Labs Reviewed  CBC - Abnormal; Notable for the following components:      Result Value   RBC 3.62 (*)    Hemoglobin 11.0 (*)    HCT 34.9 (*)    All other components within normal limits  COMPREHENSIVE METABOLIC PANEL - Abnormal; Notable for the following components:   Glucose, Bld 107 (*)    Total Protein 6.1 (*)    Albumin 3.4 (*)    All other components within normal limits  LIPASE, BLOOD  LACTIC ACID, PLASMA  TROPONIN I  TROPONIN I    CT ANGIO CHEST PE W OR WO CONTRAST  Final Result    CT ABDOMEN PELVIS W CONTRAST  Final Result    DG Chest Port 1 View  Final Result      Medications  iohexol (OMNIPAQUE) 300 MG/ML solution 100 mL ( Intravenous Canceled Entry 06/02/18 1732)  sodium chloride 0.9 % bolus 1,000 mL (0 mLs Intravenous Stopped 06/02/18 1745)  acetaminophen (TYLENOL) tablet 1,000 mg (1,000 mg Oral Given 06/02/18 1327)  hydrocortisone sodium succinate (SOLU-CORTEF) 100 MG injection 200 mg (200 mg Intravenous Given 06/02/18 1335)  diphenhydrAMINE (BENADRYL) injection 50 mg (50 mg Intravenous Given 06/02/18 1626)  iohexol (OMNIPAQUE) 350 MG/ML injection 100 mL (100 mLs Intravenous Contrast Given 06/02/18 1731)     Procedures Critical Care  ED Course and Medical Decision Making  I have reviewed the triage vital signs and the nursing notes.  Pertinent labs & imaging results that were available during my care of the patient were reviewed by me and considered in my medical decision making (see below for details).  Considering pulmonary embolism in this 85 year old female has had a recent diagnosis of DVT, but has since had to discontinue  anticoagulation due to a GI bleed.  Also considering ACS given her CAD history.  Patient has a contrast allergy, but has tolerated our pretreatment protocol in the past.  Has been provided with hydrocortisone, awaiting Benadryl and CT imaging.  First troponin negative.  Troponin is negative x2.  CT imaging is completely unremarkable.  I discussed the patient's symptoms with her daughter at length.  Patient complains of similar pain almost every day.  Usually goes away after being provided Gas-X.  Patient has had no complaints of pain during her time here in the emergency department.  EKG overall without significant changes.  With 2- troponins I doubt a cardiac etiology and she is safe for discharge with PCP follow-up.  After the discussed management above, the patient was determined to be safe for discharge.  The patient was in agreement with this plan and all questions regarding their care were answered.  ED return precautions were discussed and the patient will return to the ED with any significant worsening of condition.  Barth Kirks. Sedonia Small, [place] [email or web address]  Final Clinical Impressions(s) / ED Diagnoses     ICD-10-CM   1. Abdominal pain, unspecified abdominal location R10.9   2. Chest pain R07.9 DG Chest [facility] 1 View    DG Chest [facility]    ED Discharge Orders    None         Maudie Flakes, MD 06/02/18 [phone]

## 2018-06-02 NOTE — ED Notes (Signed)
Pt returns from CT.

## 2018-06-02 NOTE — ED Notes (Signed)
Allergy Pre-medication regimen verified with CT.  Pt to receive IV benadryl at 1630 and CT at 1730.

## 2018-06-02 NOTE — Discharge Instructions (Addendum)
You were evaluated in the Emergency Department and after careful evaluation, we did not find any emergent condition requiring admission or further testing in the hospital.  Your testing today was very reassuring, with no evidence of heart damage and no evidence of blood clots or other emergencies on the CT scans.  Please return to the Emergency Department if you experience any worsening of your condition.  We encourage you to follow up with a primary care provider.  Thank you for allowing [place] to be a part of your care.

## 2018-06-04 DIAGNOSIS — Z6820 Body mass index (BMI) 20.0-20.9, adult: Secondary | ICD-10-CM | POA: Diagnosis not present

## 2018-06-04 DIAGNOSIS — I2510 Atherosclerotic heart disease of native coronary artery without angina pectoris: Secondary | ICD-10-CM | POA: Diagnosis not present

## 2018-06-04 DIAGNOSIS — K922 Gastrointestinal hemorrhage, unspecified: Secondary | ICD-10-CM | POA: Diagnosis not present

## 2018-06-04 DIAGNOSIS — R296 Repeated falls: Secondary | ICD-10-CM | POA: Diagnosis not present

## 2018-06-08 DIAGNOSIS — Z9181 History of falling: Secondary | ICD-10-CM | POA: Diagnosis not present

## 2018-06-08 DIAGNOSIS — K5730 Diverticulosis of large intestine without perforation or abscess without bleeding: Secondary | ICD-10-CM | POA: Diagnosis not present

## 2018-06-08 DIAGNOSIS — M1991 Primary osteoarthritis, unspecified site: Secondary | ICD-10-CM | POA: Diagnosis not present

## 2018-06-08 DIAGNOSIS — Z8673 Personal history of transient ischemic attack (TIA), and cerebral infarction without residual deficits: Secondary | ICD-10-CM | POA: Diagnosis not present

## 2018-06-08 DIAGNOSIS — I82401 Acute embolism and thrombosis of unspecified deep veins of right lower extremity: Secondary | ICD-10-CM | POA: Diagnosis not present

## 2018-06-08 DIAGNOSIS — K219 Gastro-esophageal reflux disease without esophagitis: Secondary | ICD-10-CM | POA: Diagnosis not present

## 2018-06-08 DIAGNOSIS — I10 Essential (primary) hypertension: Secondary | ICD-10-CM | POA: Diagnosis not present

## 2018-06-08 DIAGNOSIS — E785 Hyperlipidemia, unspecified: Secondary | ICD-10-CM | POA: Diagnosis not present

## 2018-06-08 DIAGNOSIS — Z950 Presence of cardiac pacemaker: Secondary | ICD-10-CM | POA: Diagnosis not present

## 2018-06-08 DIAGNOSIS — I2510 Atherosclerotic heart disease of native coronary artery without angina pectoris: Secondary | ICD-10-CM | POA: Diagnosis not present

## 2018-06-08 DIAGNOSIS — J45909 Unspecified asthma, uncomplicated: Secondary | ICD-10-CM | POA: Diagnosis not present

## 2018-06-08 DIAGNOSIS — E039 Hypothyroidism, unspecified: Secondary | ICD-10-CM | POA: Diagnosis not present

## 2018-06-08 DIAGNOSIS — Z87891 Personal history of nicotine dependence: Secondary | ICD-10-CM | POA: Diagnosis not present

## 2018-06-08 DIAGNOSIS — G8929 Other chronic pain: Secondary | ICD-10-CM | POA: Diagnosis not present

## 2018-06-08 DIAGNOSIS — D500 Iron deficiency anemia secondary to blood loss (chronic): Secondary | ICD-10-CM | POA: Diagnosis not present

## 2018-06-09 DIAGNOSIS — Z8673 Personal history of transient ischemic attack (TIA), and cerebral infarction without residual deficits: Secondary | ICD-10-CM | POA: Diagnosis not present

## 2018-06-09 DIAGNOSIS — M1991 Primary osteoarthritis, unspecified site: Secondary | ICD-10-CM | POA: Diagnosis not present

## 2018-06-09 DIAGNOSIS — I10 Essential (primary) hypertension: Secondary | ICD-10-CM | POA: Diagnosis not present

## 2018-06-09 DIAGNOSIS — E785 Hyperlipidemia, unspecified: Secondary | ICD-10-CM | POA: Diagnosis not present

## 2018-06-09 DIAGNOSIS — Z9181 History of falling: Secondary | ICD-10-CM | POA: Diagnosis not present

## 2018-06-09 DIAGNOSIS — I2510 Atherosclerotic heart disease of native coronary artery without angina pectoris: Secondary | ICD-10-CM | POA: Diagnosis not present

## 2018-06-09 DIAGNOSIS — D500 Iron deficiency anemia secondary to blood loss (chronic): Secondary | ICD-10-CM | POA: Diagnosis not present

## 2018-06-09 DIAGNOSIS — I82401 Acute embolism and thrombosis of unspecified deep veins of right lower extremity: Secondary | ICD-10-CM | POA: Diagnosis not present

## 2018-06-09 DIAGNOSIS — J45909 Unspecified asthma, uncomplicated: Secondary | ICD-10-CM | POA: Diagnosis not present

## 2018-06-09 DIAGNOSIS — K219 Gastro-esophageal reflux disease without esophagitis: Secondary | ICD-10-CM | POA: Diagnosis not present

## 2018-06-09 DIAGNOSIS — K5730 Diverticulosis of large intestine without perforation or abscess without bleeding: Secondary | ICD-10-CM | POA: Diagnosis not present

## 2018-06-09 DIAGNOSIS — Z87891 Personal history of nicotine dependence: Secondary | ICD-10-CM | POA: Diagnosis not present

## 2018-06-09 DIAGNOSIS — Z950 Presence of cardiac pacemaker: Secondary | ICD-10-CM | POA: Diagnosis not present

## 2018-06-09 DIAGNOSIS — G8929 Other chronic pain: Secondary | ICD-10-CM | POA: Diagnosis not present

## 2018-06-09 DIAGNOSIS — E039 Hypothyroidism, unspecified: Secondary | ICD-10-CM | POA: Diagnosis not present

## 2018-06-10 ENCOUNTER — Ambulatory Visit: Payer: Medicare Other | Admitting: Neurology

## 2018-06-16 DIAGNOSIS — Z950 Presence of cardiac pacemaker: Secondary | ICD-10-CM | POA: Diagnosis not present

## 2018-06-16 DIAGNOSIS — E039 Hypothyroidism, unspecified: Secondary | ICD-10-CM | POA: Diagnosis not present

## 2018-06-16 DIAGNOSIS — Z8673 Personal history of transient ischemic attack (TIA), and cerebral infarction without residual deficits: Secondary | ICD-10-CM | POA: Diagnosis not present

## 2018-06-16 DIAGNOSIS — I10 Essential (primary) hypertension: Secondary | ICD-10-CM | POA: Diagnosis not present

## 2018-06-16 DIAGNOSIS — Z87891 Personal history of nicotine dependence: Secondary | ICD-10-CM | POA: Diagnosis not present

## 2018-06-16 DIAGNOSIS — Z9181 History of falling: Secondary | ICD-10-CM | POA: Diagnosis not present

## 2018-06-16 DIAGNOSIS — M1991 Primary osteoarthritis, unspecified site: Secondary | ICD-10-CM | POA: Diagnosis not present

## 2018-06-16 DIAGNOSIS — E785 Hyperlipidemia, unspecified: Secondary | ICD-10-CM | POA: Diagnosis not present

## 2018-06-16 DIAGNOSIS — K219 Gastro-esophageal reflux disease without esophagitis: Secondary | ICD-10-CM | POA: Diagnosis not present

## 2018-06-16 DIAGNOSIS — D500 Iron deficiency anemia secondary to blood loss (chronic): Secondary | ICD-10-CM | POA: Diagnosis not present

## 2018-06-16 DIAGNOSIS — G8929 Other chronic pain: Secondary | ICD-10-CM | POA: Diagnosis not present

## 2018-06-16 DIAGNOSIS — I82401 Acute embolism and thrombosis of unspecified deep veins of right lower extremity: Secondary | ICD-10-CM | POA: Diagnosis not present

## 2018-06-16 DIAGNOSIS — I2510 Atherosclerotic heart disease of native coronary artery without angina pectoris: Secondary | ICD-10-CM | POA: Diagnosis not present

## 2018-06-16 DIAGNOSIS — K5730 Diverticulosis of large intestine without perforation or abscess without bleeding: Secondary | ICD-10-CM | POA: Diagnosis not present

## 2018-06-16 DIAGNOSIS — J45909 Unspecified asthma, uncomplicated: Secondary | ICD-10-CM | POA: Diagnosis not present

## 2018-06-18 DIAGNOSIS — E039 Hypothyroidism, unspecified: Secondary | ICD-10-CM | POA: Diagnosis not present

## 2018-06-18 DIAGNOSIS — Z9181 History of falling: Secondary | ICD-10-CM | POA: Diagnosis not present

## 2018-06-18 DIAGNOSIS — K5730 Diverticulosis of large intestine without perforation or abscess without bleeding: Secondary | ICD-10-CM | POA: Diagnosis not present

## 2018-06-18 DIAGNOSIS — I82401 Acute embolism and thrombosis of unspecified deep veins of right lower extremity: Secondary | ICD-10-CM | POA: Diagnosis not present

## 2018-06-18 DIAGNOSIS — I10 Essential (primary) hypertension: Secondary | ICD-10-CM | POA: Diagnosis not present

## 2018-06-18 DIAGNOSIS — G8929 Other chronic pain: Secondary | ICD-10-CM | POA: Diagnosis not present

## 2018-06-18 DIAGNOSIS — Z8673 Personal history of transient ischemic attack (TIA), and cerebral infarction without residual deficits: Secondary | ICD-10-CM | POA: Diagnosis not present

## 2018-06-18 DIAGNOSIS — K219 Gastro-esophageal reflux disease without esophagitis: Secondary | ICD-10-CM | POA: Diagnosis not present

## 2018-06-18 DIAGNOSIS — J45909 Unspecified asthma, uncomplicated: Secondary | ICD-10-CM | POA: Diagnosis not present

## 2018-06-18 DIAGNOSIS — E785 Hyperlipidemia, unspecified: Secondary | ICD-10-CM | POA: Diagnosis not present

## 2018-06-18 DIAGNOSIS — Z87891 Personal history of nicotine dependence: Secondary | ICD-10-CM | POA: Diagnosis not present

## 2018-06-18 DIAGNOSIS — Z950 Presence of cardiac pacemaker: Secondary | ICD-10-CM | POA: Diagnosis not present

## 2018-06-18 DIAGNOSIS — M1991 Primary osteoarthritis, unspecified site: Secondary | ICD-10-CM | POA: Diagnosis not present

## 2018-06-18 DIAGNOSIS — D500 Iron deficiency anemia secondary to blood loss (chronic): Secondary | ICD-10-CM | POA: Diagnosis not present

## 2018-06-18 DIAGNOSIS — I2510 Atherosclerotic heart disease of native coronary artery without angina pectoris: Secondary | ICD-10-CM | POA: Diagnosis not present

## 2018-06-23 DIAGNOSIS — Z950 Presence of cardiac pacemaker: Secondary | ICD-10-CM | POA: Diagnosis not present

## 2018-06-23 DIAGNOSIS — Z9181 History of falling: Secondary | ICD-10-CM | POA: Diagnosis not present

## 2018-06-23 DIAGNOSIS — I2510 Atherosclerotic heart disease of native coronary artery without angina pectoris: Secondary | ICD-10-CM | POA: Diagnosis not present

## 2018-06-23 DIAGNOSIS — Z8673 Personal history of transient ischemic attack (TIA), and cerebral infarction without residual deficits: Secondary | ICD-10-CM | POA: Diagnosis not present

## 2018-06-23 DIAGNOSIS — Z87891 Personal history of nicotine dependence: Secondary | ICD-10-CM | POA: Diagnosis not present

## 2018-06-23 DIAGNOSIS — J45909 Unspecified asthma, uncomplicated: Secondary | ICD-10-CM | POA: Diagnosis not present

## 2018-06-23 DIAGNOSIS — E785 Hyperlipidemia, unspecified: Secondary | ICD-10-CM | POA: Diagnosis not present

## 2018-06-23 DIAGNOSIS — I10 Essential (primary) hypertension: Secondary | ICD-10-CM | POA: Diagnosis not present

## 2018-06-23 DIAGNOSIS — I82401 Acute embolism and thrombosis of unspecified deep veins of right lower extremity: Secondary | ICD-10-CM | POA: Diagnosis not present

## 2018-06-23 DIAGNOSIS — D500 Iron deficiency anemia secondary to blood loss (chronic): Secondary | ICD-10-CM | POA: Diagnosis not present

## 2018-06-23 DIAGNOSIS — E039 Hypothyroidism, unspecified: Secondary | ICD-10-CM | POA: Diagnosis not present

## 2018-06-23 DIAGNOSIS — K5730 Diverticulosis of large intestine without perforation or abscess without bleeding: Secondary | ICD-10-CM | POA: Diagnosis not present

## 2018-06-23 DIAGNOSIS — K219 Gastro-esophageal reflux disease without esophagitis: Secondary | ICD-10-CM | POA: Diagnosis not present

## 2018-06-23 DIAGNOSIS — M1991 Primary osteoarthritis, unspecified site: Secondary | ICD-10-CM | POA: Diagnosis not present

## 2018-06-23 DIAGNOSIS — G8929 Other chronic pain: Secondary | ICD-10-CM | POA: Diagnosis not present

## 2018-06-25 DIAGNOSIS — Z9181 History of falling: Secondary | ICD-10-CM | POA: Diagnosis not present

## 2018-06-25 DIAGNOSIS — I2510 Atherosclerotic heart disease of native coronary artery without angina pectoris: Secondary | ICD-10-CM | POA: Diagnosis not present

## 2018-06-25 DIAGNOSIS — E785 Hyperlipidemia, unspecified: Secondary | ICD-10-CM | POA: Diagnosis not present

## 2018-06-25 DIAGNOSIS — I10 Essential (primary) hypertension: Secondary | ICD-10-CM | POA: Diagnosis not present

## 2018-06-25 DIAGNOSIS — K5730 Diverticulosis of large intestine without perforation or abscess without bleeding: Secondary | ICD-10-CM | POA: Diagnosis not present

## 2018-06-25 DIAGNOSIS — E039 Hypothyroidism, unspecified: Secondary | ICD-10-CM | POA: Diagnosis not present

## 2018-06-25 DIAGNOSIS — J45909 Unspecified asthma, uncomplicated: Secondary | ICD-10-CM | POA: Diagnosis not present

## 2018-06-25 DIAGNOSIS — Z8673 Personal history of transient ischemic attack (TIA), and cerebral infarction without residual deficits: Secondary | ICD-10-CM | POA: Diagnosis not present

## 2018-06-25 DIAGNOSIS — K219 Gastro-esophageal reflux disease without esophagitis: Secondary | ICD-10-CM | POA: Diagnosis not present

## 2018-06-25 DIAGNOSIS — D500 Iron deficiency anemia secondary to blood loss (chronic): Secondary | ICD-10-CM | POA: Diagnosis not present

## 2018-06-25 DIAGNOSIS — I82401 Acute embolism and thrombosis of unspecified deep veins of right lower extremity: Secondary | ICD-10-CM | POA: Diagnosis not present

## 2018-06-25 DIAGNOSIS — M1991 Primary osteoarthritis, unspecified site: Secondary | ICD-10-CM | POA: Diagnosis not present

## 2018-06-25 DIAGNOSIS — Z950 Presence of cardiac pacemaker: Secondary | ICD-10-CM | POA: Diagnosis not present

## 2018-06-25 DIAGNOSIS — Z87891 Personal history of nicotine dependence: Secondary | ICD-10-CM | POA: Diagnosis not present

## 2018-06-25 DIAGNOSIS — G8929 Other chronic pain: Secondary | ICD-10-CM | POA: Diagnosis not present

## 2018-06-28 DIAGNOSIS — J449 Chronic obstructive pulmonary disease, unspecified: Secondary | ICD-10-CM | POA: Diagnosis not present

## 2018-06-28 DIAGNOSIS — M6281 Muscle weakness (generalized): Secondary | ICD-10-CM | POA: Diagnosis not present

## 2018-07-01 DIAGNOSIS — K5730 Diverticulosis of large intestine without perforation or abscess without bleeding: Secondary | ICD-10-CM | POA: Diagnosis not present

## 2018-07-01 DIAGNOSIS — I10 Essential (primary) hypertension: Secondary | ICD-10-CM | POA: Diagnosis not present

## 2018-07-01 DIAGNOSIS — Z87891 Personal history of nicotine dependence: Secondary | ICD-10-CM | POA: Diagnosis not present

## 2018-07-01 DIAGNOSIS — I2510 Atherosclerotic heart disease of native coronary artery without angina pectoris: Secondary | ICD-10-CM | POA: Diagnosis not present

## 2018-07-01 DIAGNOSIS — J45909 Unspecified asthma, uncomplicated: Secondary | ICD-10-CM | POA: Diagnosis not present

## 2018-07-01 DIAGNOSIS — E785 Hyperlipidemia, unspecified: Secondary | ICD-10-CM | POA: Diagnosis not present

## 2018-07-01 DIAGNOSIS — Z9181 History of falling: Secondary | ICD-10-CM | POA: Diagnosis not present

## 2018-07-01 DIAGNOSIS — G8929 Other chronic pain: Secondary | ICD-10-CM | POA: Diagnosis not present

## 2018-07-01 DIAGNOSIS — Z950 Presence of cardiac pacemaker: Secondary | ICD-10-CM | POA: Diagnosis not present

## 2018-07-01 DIAGNOSIS — D500 Iron deficiency anemia secondary to blood loss (chronic): Secondary | ICD-10-CM | POA: Diagnosis not present

## 2018-07-01 DIAGNOSIS — M1991 Primary osteoarthritis, unspecified site: Secondary | ICD-10-CM | POA: Diagnosis not present

## 2018-07-01 DIAGNOSIS — E039 Hypothyroidism, unspecified: Secondary | ICD-10-CM | POA: Diagnosis not present

## 2018-07-01 DIAGNOSIS — I82401 Acute embolism and thrombosis of unspecified deep veins of right lower extremity: Secondary | ICD-10-CM | POA: Diagnosis not present

## 2018-07-01 DIAGNOSIS — Z8673 Personal history of transient ischemic attack (TIA), and cerebral infarction without residual deficits: Secondary | ICD-10-CM | POA: Diagnosis not present

## 2018-07-01 DIAGNOSIS — K219 Gastro-esophageal reflux disease without esophagitis: Secondary | ICD-10-CM | POA: Diagnosis not present

## 2018-07-14 ENCOUNTER — Other Ambulatory Visit: Payer: Self-pay | Admitting: *Deleted

## 2018-07-14 NOTE — Patient Outreach (Signed)
[employer] High Risk pt. She had 3 ED visits in May. Pt has dementia so she cannot participate in any real chronic disease management care plan. However, family could benefit from having me as an Electrical engineer on an as needed basis. Also the pt does not have an Advanced Directive and this is necessary. Pt daughter agrees and says she would not want her mother to have CPR, however, her brother would want everything done.   I will counsel the daughter and son about advanced directives in August. I am sending the forms today.Will also introduce the idea of palliative care.  I have asked Neoma Laming to call me or my office with questions. Advised to call MD office before going to the hospital if not a life threatening situation. Protect yourself and family members from [place] exposure (wear a mask, wash your hands and wait 6 ft apart).   Discussed mental stimulation for her mother: Dispensing optician.  Will send information for a bed alert, so family can know when pt is trying to get out of bed.  Eulah Pont. Myrtie Neither, MSN, [facility] Gerontological Nurse Practitioner [facility] Care Management [phone]

## 2018-07-28 DIAGNOSIS — J449 Chronic obstructive pulmonary disease, unspecified: Secondary | ICD-10-CM | POA: Diagnosis not present

## 2018-07-28 DIAGNOSIS — M6281 Muscle weakness (generalized): Secondary | ICD-10-CM | POA: Diagnosis not present

## 2018-08-03 ENCOUNTER — Ambulatory Visit (INDEPENDENT_AMBULATORY_CARE_PROVIDER_SITE_OTHER): Payer: Medicare Other | Admitting: *Deleted

## 2018-08-03 DIAGNOSIS — I495 Sick sinus syndrome: Secondary | ICD-10-CM | POA: Diagnosis not present

## 2018-08-03 DIAGNOSIS — R55 Syncope and collapse: Secondary | ICD-10-CM

## 2018-08-04 LAB — CUP PACEART REMOTE DEVICE CHECK
Battery Remaining Longevity: 127 mo
Battery Remaining Percentage: 95.5 %
Battery Voltage: 2.99 V
Brady Statistic AP VP Percent: 1 %
Brady Statistic AP VS Percent: 38 %
Brady Statistic AS VP Percent: 1 %
Brady Statistic AS VS Percent: 58 %
Brady Statistic RA Percent Paced: 32 %
Brady Statistic RV Percent Paced: 1 %
Date Time Interrogation Session: 20200727060014
Implantable Lead Implant Date: 20150203
Implantable Lead Implant Date: 20150203
Implantable Lead Location: 753859
Implantable Lead Location: 753860
Implantable Lead Model: 1948
Implantable Pulse Generator Implant Date: 20150203
Lead Channel Impedance Value: 510 Ohm
Lead Channel Impedance Value: 600 Ohm
Lead Channel Pacing Threshold Amplitude: 0.75 V
Lead Channel Pacing Threshold Amplitude: 1 V
Lead Channel Pacing Threshold Pulse Width: 0.4 ms
Lead Channel Pacing Threshold Pulse Width: 0.4 ms
Lead Channel Sensing Intrinsic Amplitude: 10.8 mV
Lead Channel Sensing Intrinsic Amplitude: 3.2 mV
Lead Channel Setting Pacing Amplitude: 2 V
Lead Channel Setting Pacing Amplitude: 2.5 V
Lead Channel Setting Pacing Pulse Width: 0.4 ms
Lead Channel Setting Sensing Sensitivity: 2 mV
Pulse Gen Model: 2240
Pulse Gen Serial Number: 7586568

## 2018-08-17 ENCOUNTER — Other Ambulatory Visit: Payer: Self-pay

## 2018-08-17 ENCOUNTER — Other Ambulatory Visit: Payer: Self-pay | Admitting: *Deleted

## 2018-08-17 ENCOUNTER — Encounter: Payer: Self-pay | Admitting: *Deleted

## 2018-08-17 NOTE — Patient Outreach (Signed)
Telephone call to follow up on advanced directives.  Talked with Mrs. Lemar Livings, pt's daughter and caregiver. She reports she did get the MOST form but has misplaced it and would like another one.   She has not been able to discuss end of life planning with her brother. He does not understand that at 72 his mother could be close to the end of her life and that with her dementia, life prolonging interventions are usually not in the best interest of the patient.  I have offered to counsel him but Mrs. Lemar Livings says that she feels it is best just to leave the topic alone so as not to upset him.  She will endeavor to make the best decisions for her mother's conditions as they present themselves.  Case being closed.  Eulah Pont. Myrtie Neither, MSN, [facility] Gerontological Nurse Practitioner [facility] Care Management [phone]

## 2018-08-18 NOTE — Progress Notes (Signed)
Remote pacemaker transmission.   

## 2018-08-24 DIAGNOSIS — M79671 Pain in right foot: Secondary | ICD-10-CM | POA: Diagnosis not present

## 2018-08-24 DIAGNOSIS — I739 Peripheral vascular disease, unspecified: Secondary | ICD-10-CM | POA: Diagnosis not present

## 2018-08-24 DIAGNOSIS — M79672 Pain in left foot: Secondary | ICD-10-CM | POA: Diagnosis not present

## 2018-08-25 DIAGNOSIS — D500 Iron deficiency anemia secondary to blood loss (chronic): Secondary | ICD-10-CM | POA: Diagnosis not present

## 2018-08-25 DIAGNOSIS — I10 Essential (primary) hypertension: Secondary | ICD-10-CM | POA: Diagnosis not present

## 2018-08-25 DIAGNOSIS — I82401 Acute embolism and thrombosis of unspecified deep veins of right lower extremity: Secondary | ICD-10-CM | POA: Diagnosis not present

## 2018-08-25 DIAGNOSIS — I2510 Atherosclerotic heart disease of native coronary artery without angina pectoris: Secondary | ICD-10-CM | POA: Diagnosis not present

## 2018-08-28 DIAGNOSIS — M6281 Muscle weakness (generalized): Secondary | ICD-10-CM | POA: Diagnosis not present

## 2018-08-28 DIAGNOSIS — J449 Chronic obstructive pulmonary disease, unspecified: Secondary | ICD-10-CM | POA: Diagnosis not present

## 2018-08-31 ENCOUNTER — Encounter: Payer: Self-pay | Admitting: Internal Medicine

## 2018-09-03 ENCOUNTER — Encounter: Payer: Medicare Other | Admitting: Internal Medicine

## 2018-09-07 DIAGNOSIS — I10 Essential (primary) hypertension: Secondary | ICD-10-CM | POA: Diagnosis not present

## 2018-09-07 DIAGNOSIS — E782 Mixed hyperlipidemia: Secondary | ICD-10-CM | POA: Diagnosis not present

## 2018-09-07 DIAGNOSIS — I25119 Atherosclerotic heart disease of native coronary artery with unspecified angina pectoris: Secondary | ICD-10-CM | POA: Diagnosis not present

## 2018-09-28 DIAGNOSIS — M6281 Muscle weakness (generalized): Secondary | ICD-10-CM | POA: Diagnosis not present

## 2018-09-28 DIAGNOSIS — J449 Chronic obstructive pulmonary disease, unspecified: Secondary | ICD-10-CM | POA: Diagnosis not present

## 2018-09-30 DIAGNOSIS — R1310 Dysphagia, unspecified: Secondary | ICD-10-CM | POA: Diagnosis not present

## 2018-09-30 DIAGNOSIS — T148XXA Other injury of unspecified body region, initial encounter: Secondary | ICD-10-CM | POA: Diagnosis not present

## 2018-09-30 DIAGNOSIS — I10 Essential (primary) hypertension: Secondary | ICD-10-CM | POA: Diagnosis not present

## 2018-09-30 DIAGNOSIS — R251 Tremor, unspecified: Secondary | ICD-10-CM | POA: Diagnosis not present

## 2018-09-30 DIAGNOSIS — N342 Other urethritis: Secondary | ICD-10-CM | POA: Diagnosis not present

## 2018-09-30 DIAGNOSIS — N390 Urinary tract infection, site not specified: Secondary | ICD-10-CM | POA: Diagnosis not present

## 2018-10-05 DIAGNOSIS — L03116 Cellulitis of left lower limb: Secondary | ICD-10-CM | POA: Diagnosis not present

## 2018-10-05 DIAGNOSIS — S80812A Abrasion, left lower leg, initial encounter: Secondary | ICD-10-CM | POA: Diagnosis not present

## 2018-10-07 DIAGNOSIS — E7849 Other hyperlipidemia: Secondary | ICD-10-CM | POA: Diagnosis not present

## 2018-10-07 DIAGNOSIS — M1991 Primary osteoarthritis, unspecified site: Secondary | ICD-10-CM | POA: Diagnosis not present

## 2018-10-07 DIAGNOSIS — I10 Essential (primary) hypertension: Secondary | ICD-10-CM | POA: Diagnosis not present

## 2018-10-07 DIAGNOSIS — I2510 Atherosclerotic heart disease of native coronary artery without angina pectoris: Secondary | ICD-10-CM | POA: Diagnosis not present

## 2018-10-28 DIAGNOSIS — M6281 Muscle weakness (generalized): Secondary | ICD-10-CM | POA: Diagnosis not present

## 2018-10-28 DIAGNOSIS — J449 Chronic obstructive pulmonary disease, unspecified: Secondary | ICD-10-CM | POA: Diagnosis not present

## 2018-10-29 ENCOUNTER — Telehealth (INDEPENDENT_AMBULATORY_CARE_PROVIDER_SITE_OTHER): Payer: Medicare Other | Admitting: Internal Medicine

## 2018-10-29 ENCOUNTER — Other Ambulatory Visit: Payer: Self-pay

## 2018-10-29 DIAGNOSIS — I495 Sick sinus syndrome: Secondary | ICD-10-CM | POA: Diagnosis not present

## 2018-10-29 DIAGNOSIS — Z950 Presence of cardiac pacemaker: Secondary | ICD-10-CM

## 2018-10-29 NOTE — Progress Notes (Signed)
Electrophysiology TeleHealth Note   Due to national recommendations of social distancing due to COVID 19, an audio/video telehealth visit is felt to be most appropriate for this patient at this time.  See MyChart message from today for the patient's consent to telehealth for [facility].   Date:  10/29/2018   ID:  [name], DOB 1925/10/25, MRN [id number]  Location: patient's home  Provider location: [address], [place]  Evaluation Performed: Follow-up visit  PCP:  Sharilyn Sites, MD  Cardiologist:  Kate Sable, MD  Electrophysiologist:  Dr Lovena Le  Chief Complaint:  "Moma's been doing well."  History of Present Illness:    [name] is a 85 y.o. female who presents via audio/video conferencing for a telehealth visit today. Mrs. Jasko is returnstoday for ongoing evaluation of her PPM. She is a pleasant 85yo woman with symptomatic sinus node dysfunction, s/p PPM insertion, along with CAD, s/p CABG.  Her daughter tells me that her memory is getting worse due to dementia. No frank syncope. No significant agitation.   Since last being seen in our clinic, the patient's daughter notes that her dementia has progressed. She is anxious.    The patient denies symptoms of fevers, chills, cough, or new SOB worrisome for COVID 19.  Past Medical History:  Diagnosis Date  . Anemia   . Arthritis    "fingers; back" (02/09/2013)  . Asthma   . CAD (coronary artery disease)    CABG surgery in 06/2005 for left main disease; normal EF; negative stress nuclear 08/2008  . Cerebrovascular disease    1993-CVA; 08/2008-mild atherosclerosis without focal stenosis  . Chronic back pain greater than 3 months duration    "nerve damage down into left leg" (02/09/2013)  . Chronic bronchitis ([place])    "haven't had it in last 2-3 years; used to get it q yr" (02/09/2013)  . COPD (chronic obstructive pulmonary disease) ([place])   . Dementia ([place])    "more than mild; having more short  term memory loss recently; on Aricept" (02/09/2013)  . Diverticulosis   . Dizziness    chronic  . Family history of anesthesia complication    "makes daughter sick" (02/09/2013)  . Frequent PVCs   . Frequent UTI   . GERD (gastroesophageal reflux disease)   . [id number])    "sometimes weekly" (02/09/2013)  . High cholesterol   . Hypertension   . Mental disorder    mild dementia  . Orthostatic hypotension   . Osteopenia   . Pneumonia    "couple times; several years ago" (02/09/2013)  . Sinus bradycardia    s/p STJ Assurity dual chamber pacemaker by Dr Rayann Heman 02/2013  . Stroke ([place])    20-30 yrs ago  . Syncope     Negative even recorder dated 3/08  . Tobacco abuse    60 pack years; continuing    Past Surgical History:  Procedure Laterality Date  . ABDOMINAL HYSTERECTOMY  1990  . APPENDECTOMY    . CARDIAC CATHETERIZATION  2007  . CATARACT EXTRACTION, BILATERAL    . COLONOSCOPY  10/10/2011   Procedure: COLONOSCOPY;  Surgeon: Rogene Houston, MD;  [facility];  Service: Endoscopy;  Laterality: N/A;  1230  . COLONOSCOPY N/A 04/12/2015   Procedure: COLONOSCOPY;  Surgeon: Rogene Houston, MD;  [facility];  Service: Endoscopy;  Laterality: N/A;  . CORONARY ARTERY BYPASS GRAFT  2007   Left main disease  . ESOPHAGOGASTRODUODENOSCOPY  10/10/2011   Procedure: ESOPHAGOGASTRODUODENOSCOPY (EGD);  Surgeon: Rogene Houston, MD;  [facility];  Service: Endoscopy;  Laterality: N/A;  . ESOPHAGOGASTRODUODENOSCOPY N/A 04/08/2015   Procedure: ESOPHAGOGASTRODUODENOSCOPY (EGD);  Surgeon: Rogene Houston, MD;  [facility];  Service: Endoscopy;  Laterality: N/A;  . HERNIA REPAIR    . PACEMAKER INSERTION  02/09/2013   STJ Assurity pacemaker implanted by Dr Rayann Heman for symptomatic bradycardia  . PERMANENT PACEMAKER INSERTION N/A 02/09/2013   Procedure: PERMANENT PACEMAKER INSERTION;  Surgeon: Coralyn Mark, MD;  Location: [place] CATH LAB;  Service: Cardiovascular;   Laterality: N/A;  . TONSILLECTOMY    . VENTRAL HERNIA REPAIR      Current Outpatient Medications  Medication Sig Dispense Refill  . acetaminophen (TYLENOL) 325 MG tablet Take 650 mg by mouth every 6 (six) hours as needed for pain. For pain    . allopurinol (ZYLOPRIM) 100 MG tablet Take 1 tablet by mouth daily.    . busPIRone (BUSPAR) 5 MG tablet Take 5 mg by mouth 2 (two) times daily.     . Cyanocobalamin (VITAMIN B-12 CR PO) Take 1 tablet by mouth every evening.     . donepezil (ARICEPT) 10 MG tablet Take 10 mg by mouth at bedtime.     Marland Kitchen levocetirizine (XYZAL) 5 MG tablet Take 5 mg by mouth every morning.     Marland Kitchen levothyroxine (SYNTHROID, LEVOTHROID) 50 MCG tablet Take 50 mcg by mouth every morning.     . memantine (NAMENDA) 5 MG tablet Take 5 mg by mouth 2 (two) times daily.     . Multiple Vitamins-Minerals (PRESERVISION AREDS 2+MULTI VIT) CAPS Take 1 capsule by mouth 2 (two) times a day.    . nitroGLYCERIN (NITROSTAT) 0.4 MG SL tablet PLACE ONE TABLET UNDER THE TONGUE EVERY 5 MINUTES AS NEEDED FOR CHESTPAIN DO NOT EXCEED 3 IN 24 HOURS (Patient taking differently: Place 0.4 mg under the tongue every 5 (five) minutes as needed for chest pain. ) 25 tablet 3  . omeprazole (PRILOSEC) 20 MG capsule Take 2 capsules (40 mg total) by mouth daily. (Patient taking differently: Take 20 mg by mouth every morning. ) 90 capsule 3   No current facility-administered medications for this visit.     Allergies:   Aleve [naproxen sodium], Ativan [lorazepam], Cephalexin, Contrast media [iodinated diagnostic agents], Famvir [famciclovir], Haldol [haloperidol lactate], Iohexol, Ketek [telithromycin], Penicillins, Shellfish allergy, and Sulfa antibiotics   Social History:  The patient  reports that she quit smoking about 12 years ago. Her smoking use included cigarettes. She started smoking about 65 years ago. She has a 30.00 pack-year smoking history. She has never used smokeless tobacco. She reports that she does  not drink alcohol or use drugs.   Family History:  The patient's family history includes Angina in her mother; Arthritis in an other family member; Asthma in an other family member; Heart attack in her mother; Heart disease in an other family member; Lung disease in an other family member.   ROS:  Please see the history of present illness.   All other systems are personally reviewed and negative.    Exam:    Vital Signs:  BP - 113/75, P - 70, O2Sat 97, Weight 113   Labs/Other Tests and Data Reviewed:    Recent Labs: 05/25/2018: Magnesium 2.1 06/02/2018: ALT 14; BUN 10; Creatinine, Ser 0.77; Hemoglobin 11.0; Platelets 301; Potassium 3.5; Sodium 144   Wt Readings from Last 3 Encounters:  06/02/18 113 lb 15.7  oz (51.7 kg)  05/28/18 113 lb 15.7 oz (51.7 kg)  05/12/18 121 lb (54.9 kg)     Other studies personally reviewed: Last device remote is reviewed from [employer] PDF dated 08/04/18 which reveals normal device function, no arrhythmias    ASSESSMENT & PLAN:    1.  PAF - she is mostly maintaining NSR. She has had problems with bleeding and is not a candidate for anti-coagulation.  2. PPM - her [employer] DDD PM has been working normally. 3. Dementia - her daughter states that this is progressing, especially the anxiety.  4. COVID 19 screen The patient denies symptoms of COVID 19 at this time.  The importance of social distancing was discussed today.  Follow-up:  One year with me Next remote: later this month  Current medicines are reviewed at length with the patient today.   The patient does not have concerns regarding her medicines.  The following changes were made today:  none  Labs/ tests ordered today include: none No orders of the defined types were placed in this encounter.    Patient Risk:  after full review of this patients clinical status, I feel that they are at moderate risk at this time.  Today, I have spent 15 minutes with the patient with telehealth technology  discussing all of the above .    Signed, Cristopher Peru, MD  10/29/2018 12:48 PM     [place] [address] [phone] (office) [phone] (fax)

## 2018-10-29 NOTE — Patient Instructions (Signed)
Medication Instructions:  Your physician recommends that you continue on your current medications as directed. Please refer to the Current Medication list given to you today.  *If you need a refill on your cardiac medications before your next appointment, please call your pharmacy*  Lab Work: NONE  If you have labs (blood work) drawn today and your tests are completely normal, you will receive your results only by: . MyChart Message (if you have MyChart) OR . A paper copy in the mail If you have any lab test that is abnormal or we need to change your treatment, we will call you to review the results.  Testing/Procedures: NONE   Follow-Up: At [facility], you and your health needs are our priority.  As part of our continuing mission to provide you with exceptional heart care, we have created designated Provider Care Teams.  These Care Teams include your primary Cardiologist (physician) and Advanced Practice Providers (APPs -  Physician Assistants and Nurse Practitioners) who all work together to provide you with the care you need, when you need it.  Your next appointment:   12 months  The format for your next appointment:   In Person  Provider:   Gregg Taylor, MD  Other Instructions Thank you for choosing [facility]!    

## 2018-11-03 ENCOUNTER — Ambulatory Visit (INDEPENDENT_AMBULATORY_CARE_PROVIDER_SITE_OTHER): Payer: Medicare Other | Admitting: *Deleted

## 2018-11-03 DIAGNOSIS — R55 Syncope and collapse: Secondary | ICD-10-CM

## 2018-11-03 DIAGNOSIS — I495 Sick sinus syndrome: Secondary | ICD-10-CM

## 2018-11-03 LAB — CUP PACEART REMOTE DEVICE CHECK
Battery Remaining Longevity: 127 mo
Battery Remaining Percentage: 95.5 %
Battery Voltage: 2.99 V
Brady Statistic AP VP Percent: 1 %
Brady Statistic AP VS Percent: 39 %
Brady Statistic AS VP Percent: 1 %
Brady Statistic AS VS Percent: 58 %
Brady Statistic RA Percent Paced: 34 %
Brady Statistic RV Percent Paced: 1 %
Date Time Interrogation Session: 20201026060014
Implantable Lead Implant Date: 20150203
Implantable Lead Implant Date: 20150203
Implantable Lead Location: 753859
Implantable Lead Location: 753860
Implantable Lead Model: 1948
Implantable Pulse Generator Implant Date: 20150203
Lead Channel Impedance Value: 490 Ohm
Lead Channel Impedance Value: 560 Ohm
Lead Channel Pacing Threshold Amplitude: 0.75 V
Lead Channel Pacing Threshold Amplitude: 1 V
Lead Channel Pacing Threshold Pulse Width: 0.4 ms
Lead Channel Pacing Threshold Pulse Width: 0.4 ms
Lead Channel Sensing Intrinsic Amplitude: 10.2 mV
Lead Channel Sensing Intrinsic Amplitude: 3.2 mV
Lead Channel Setting Pacing Amplitude: 2 V
Lead Channel Setting Pacing Amplitude: 2.5 V
Lead Channel Setting Pacing Pulse Width: 0.4 ms
Lead Channel Setting Sensing Sensitivity: 2 mV
Pulse Gen Model: 2240
Pulse Gen Serial Number: 7586568

## 2018-11-24 NOTE — Progress Notes (Signed)
Remote pacemaker transmission.   

## 2018-11-28 DIAGNOSIS — J449 Chronic obstructive pulmonary disease, unspecified: Secondary | ICD-10-CM | POA: Diagnosis not present

## 2018-11-28 DIAGNOSIS — M6281 Muscle weakness (generalized): Secondary | ICD-10-CM | POA: Diagnosis not present

## 2018-12-07 DIAGNOSIS — E7849 Other hyperlipidemia: Secondary | ICD-10-CM | POA: Diagnosis not present

## 2018-12-07 DIAGNOSIS — M1991 Primary osteoarthritis, unspecified site: Secondary | ICD-10-CM | POA: Diagnosis not present

## 2018-12-07 DIAGNOSIS — I25119 Atherosclerotic heart disease of native coronary artery with unspecified angina pectoris: Secondary | ICD-10-CM | POA: Diagnosis not present

## 2018-12-28 DIAGNOSIS — M6281 Muscle weakness (generalized): Secondary | ICD-10-CM | POA: Diagnosis not present

## 2018-12-28 DIAGNOSIS — J449 Chronic obstructive pulmonary disease, unspecified: Secondary | ICD-10-CM | POA: Diagnosis not present

## 2019-01-07 DIAGNOSIS — M1991 Primary osteoarthritis, unspecified site: Secondary | ICD-10-CM | POA: Diagnosis not present

## 2019-01-07 DIAGNOSIS — M5136 Other intervertebral disc degeneration, lumbar region: Secondary | ICD-10-CM | POA: Diagnosis not present

## 2019-01-07 DIAGNOSIS — I25119 Atherosclerotic heart disease of native coronary artery with unspecified angina pectoris: Secondary | ICD-10-CM | POA: Diagnosis not present

## 2019-01-21 DIAGNOSIS — I739 Peripheral vascular disease, unspecified: Secondary | ICD-10-CM | POA: Diagnosis not present

## 2019-01-21 DIAGNOSIS — E538 Deficiency of other specified B group vitamins: Secondary | ICD-10-CM | POA: Diagnosis not present

## 2019-01-21 DIAGNOSIS — M169 Osteoarthritis of hip, unspecified: Secondary | ICD-10-CM | POA: Diagnosis not present

## 2019-01-21 DIAGNOSIS — M79672 Pain in left foot: Secondary | ICD-10-CM | POA: Diagnosis not present

## 2019-01-21 DIAGNOSIS — M79671 Pain in right foot: Secondary | ICD-10-CM | POA: Diagnosis not present

## 2019-01-21 DIAGNOSIS — R001 Bradycardia, unspecified: Secondary | ICD-10-CM | POA: Diagnosis not present

## 2019-01-28 DIAGNOSIS — J449 Chronic obstructive pulmonary disease, unspecified: Secondary | ICD-10-CM | POA: Diagnosis not present

## 2019-01-28 DIAGNOSIS — M6281 Muscle weakness (generalized): Secondary | ICD-10-CM | POA: Diagnosis not present

## 2019-02-02 ENCOUNTER — Ambulatory Visit (INDEPENDENT_AMBULATORY_CARE_PROVIDER_SITE_OTHER): Payer: Medicare Other | Admitting: *Deleted

## 2019-02-02 DIAGNOSIS — I495 Sick sinus syndrome: Secondary | ICD-10-CM

## 2019-02-02 LAB — CUP PACEART REMOTE DEVICE CHECK
Battery Remaining Longevity: 127 mo
Battery Remaining Percentage: 95.5 %
Battery Voltage: 2.98 V
Brady Statistic AP VP Percent: 1 %
Brady Statistic AP VS Percent: 40 %
Brady Statistic AS VP Percent: 1 %
Brady Statistic AS VS Percent: 57 %
Brady Statistic RA Percent Paced: 35 %
Brady Statistic RV Percent Paced: 1 %
Date Time Interrogation Session: 20210125020015
Implantable Lead Implant Date: 20150203
Implantable Lead Implant Date: 20150203
Implantable Lead Location: 753859
Implantable Lead Location: 753860
Implantable Lead Model: 1948
Implantable Pulse Generator Implant Date: 20150203
Lead Channel Impedance Value: 490 Ohm
Lead Channel Impedance Value: 600 Ohm
Lead Channel Pacing Threshold Amplitude: 0.75 V
Lead Channel Pacing Threshold Amplitude: 1 V
Lead Channel Pacing Threshold Pulse Width: 0.4 ms
Lead Channel Pacing Threshold Pulse Width: 0.4 ms
Lead Channel Sensing Intrinsic Amplitude: 11.7 mV
Lead Channel Sensing Intrinsic Amplitude: 2.7 mV
Lead Channel Setting Pacing Amplitude: 2 V
Lead Channel Setting Pacing Amplitude: 2.5 V
Lead Channel Setting Pacing Pulse Width: 0.4 ms
Lead Channel Setting Sensing Sensitivity: 2 mV
Pulse Gen Model: 2240
Pulse Gen Serial Number: 7586568

## 2019-02-07 DIAGNOSIS — M1991 Primary osteoarthritis, unspecified site: Secondary | ICD-10-CM | POA: Diagnosis not present

## 2019-02-07 DIAGNOSIS — I25119 Atherosclerotic heart disease of native coronary artery with unspecified angina pectoris: Secondary | ICD-10-CM | POA: Diagnosis not present

## 2019-02-07 DIAGNOSIS — E039 Hypothyroidism, unspecified: Secondary | ICD-10-CM | POA: Diagnosis not present

## 2019-02-28 DIAGNOSIS — M6281 Muscle weakness (generalized): Secondary | ICD-10-CM | POA: Diagnosis not present

## 2019-02-28 DIAGNOSIS — J449 Chronic obstructive pulmonary disease, unspecified: Secondary | ICD-10-CM | POA: Diagnosis not present

## 2019-03-07 DIAGNOSIS — M1991 Primary osteoarthritis, unspecified site: Secondary | ICD-10-CM | POA: Diagnosis not present

## 2019-03-07 DIAGNOSIS — E039 Hypothyroidism, unspecified: Secondary | ICD-10-CM | POA: Diagnosis not present

## 2019-03-07 DIAGNOSIS — I25119 Atherosclerotic heart disease of native coronary artery with unspecified angina pectoris: Secondary | ICD-10-CM | POA: Diagnosis not present

## 2019-03-22 DIAGNOSIS — R296 Repeated falls: Secondary | ICD-10-CM | POA: Diagnosis not present

## 2019-03-22 DIAGNOSIS — R6510 Systemic inflammatory response syndrome (SIRS) of non-infectious origin without acute organ dysfunction: Secondary | ICD-10-CM | POA: Diagnosis not present

## 2019-03-31 DIAGNOSIS — I891 Lymphangitis: Secondary | ICD-10-CM | POA: Diagnosis not present

## 2019-04-07 DIAGNOSIS — M1991 Primary osteoarthritis, unspecified site: Secondary | ICD-10-CM | POA: Diagnosis not present

## 2019-04-07 DIAGNOSIS — I25119 Atherosclerotic heart disease of native coronary artery with unspecified angina pectoris: Secondary | ICD-10-CM | POA: Diagnosis not present

## 2019-04-07 DIAGNOSIS — E039 Hypothyroidism, unspecified: Secondary | ICD-10-CM | POA: Diagnosis not present

## 2019-04-07 DIAGNOSIS — N342 Other urethritis: Secondary | ICD-10-CM | POA: Diagnosis not present

## 2019-04-29 DIAGNOSIS — I739 Peripheral vascular disease, unspecified: Secondary | ICD-10-CM | POA: Diagnosis not present

## 2019-04-29 DIAGNOSIS — M79672 Pain in left foot: Secondary | ICD-10-CM | POA: Diagnosis not present

## 2019-04-29 DIAGNOSIS — M79671 Pain in right foot: Secondary | ICD-10-CM | POA: Diagnosis not present

## 2019-05-04 ENCOUNTER — Ambulatory Visit (INDEPENDENT_AMBULATORY_CARE_PROVIDER_SITE_OTHER): Payer: Medicare Other | Admitting: *Deleted

## 2019-05-04 DIAGNOSIS — I495 Sick sinus syndrome: Secondary | ICD-10-CM | POA: Diagnosis not present

## 2019-05-04 LAB — CUP PACEART REMOTE DEVICE CHECK
Battery Remaining Longevity: 128 mo
Battery Remaining Percentage: 95.5 %
Battery Voltage: 2.98 V
Brady Statistic AP VP Percent: 1 %
Brady Statistic AP VS Percent: 41 %
Brady Statistic AS VP Percent: 1 %
Brady Statistic AS VS Percent: 57 %
Brady Statistic RA Percent Paced: 37 %
Brady Statistic RV Percent Paced: 1 %
Date Time Interrogation Session: 20210427020015
Implantable Lead Implant Date: 20150203
Implantable Lead Implant Date: 20150203
Implantable Lead Location: 753859
Implantable Lead Location: 753860
Implantable Lead Model: 1948
Implantable Pulse Generator Implant Date: 20150203
Lead Channel Impedance Value: 510 Ohm
Lead Channel Impedance Value: 630 Ohm
Lead Channel Pacing Threshold Amplitude: 0.75 V
Lead Channel Pacing Threshold Amplitude: 1 V
Lead Channel Pacing Threshold Pulse Width: 0.4 ms
Lead Channel Pacing Threshold Pulse Width: 0.4 ms
Lead Channel Sensing Intrinsic Amplitude: 12 mV
Lead Channel Sensing Intrinsic Amplitude: 2.4 mV
Lead Channel Setting Pacing Amplitude: 2 V
Lead Channel Setting Pacing Amplitude: 2.5 V
Lead Channel Setting Pacing Pulse Width: 0.4 ms
Lead Channel Setting Sensing Sensitivity: 2 mV
Pulse Gen Model: 2240
Pulse Gen Serial Number: 7586568

## 2019-05-05 NOTE — Progress Notes (Signed)
PPM Remote  

## 2019-05-07 DIAGNOSIS — I25119 Atherosclerotic heart disease of native coronary artery with unspecified angina pectoris: Secondary | ICD-10-CM | POA: Diagnosis not present

## 2019-05-07 DIAGNOSIS — M1991 Primary osteoarthritis, unspecified site: Secondary | ICD-10-CM | POA: Diagnosis not present

## 2019-05-07 DIAGNOSIS — E7849 Other hyperlipidemia: Secondary | ICD-10-CM | POA: Diagnosis not present

## 2019-05-07 DIAGNOSIS — E039 Hypothyroidism, unspecified: Secondary | ICD-10-CM | POA: Diagnosis not present

## 2019-05-24 ENCOUNTER — Encounter (HOSPITAL_COMMUNITY): Payer: Self-pay

## 2019-05-24 ENCOUNTER — Other Ambulatory Visit: Payer: Self-pay

## 2019-05-24 ENCOUNTER — Emergency Department (HOSPITAL_COMMUNITY): Payer: Medicare Other

## 2019-05-24 ENCOUNTER — Emergency Department (HOSPITAL_COMMUNITY)
Admission: EM | Admit: 2019-05-24 | Discharge: 2019-05-24 | Disposition: A | Discharge status: Home / Self Care | Payer: Medicare Other | Attending: Emergency Medicine | Admitting: Emergency Medicine

## 2019-05-24 DIAGNOSIS — R05 Cough: Secondary | ICD-10-CM | POA: Diagnosis not present

## 2019-05-24 DIAGNOSIS — Z950 Presence of cardiac pacemaker: Secondary | ICD-10-CM | POA: Diagnosis not present

## 2019-05-24 DIAGNOSIS — N3000 Acute cystitis without hematuria: Secondary | ICD-10-CM | POA: Diagnosis not present

## 2019-05-24 DIAGNOSIS — Z87891 Personal history of nicotine dependence: Secondary | ICD-10-CM | POA: Insufficient documentation

## 2019-05-24 DIAGNOSIS — I2510 Atherosclerotic heart disease of native coronary artery without angina pectoris: Secondary | ICD-10-CM | POA: Insufficient documentation

## 2019-05-24 DIAGNOSIS — J449 Chronic obstructive pulmonary disease, unspecified: Secondary | ICD-10-CM | POA: Insufficient documentation

## 2019-05-24 DIAGNOSIS — R531 Weakness: Secondary | ICD-10-CM | POA: Diagnosis not present

## 2019-05-24 DIAGNOSIS — Z79899 Other long term (current) drug therapy: Secondary | ICD-10-CM | POA: Diagnosis not present

## 2019-05-24 DIAGNOSIS — J302 Other seasonal allergic rhinitis: Secondary | ICD-10-CM | POA: Diagnosis not present

## 2019-05-24 LAB — URINALYSIS, ROUTINE W REFLEX MICROSCOPIC
Bilirubin Urine: NEGATIVE
Glucose, UA: NEGATIVE mg/dL
Hgb urine dipstick: NEGATIVE
Ketones, ur: NEGATIVE mg/dL
Nitrite: POSITIVE — AB
Protein, ur: NEGATIVE mg/dL
Specific Gravity, Urine: 1.021 (ref 1.005–1.030)
pH: 5 (ref 5.0–8.0)

## 2019-05-24 MED ORDER — SODIUM CHLORIDE 0.9 % IV SOLN: INTRAVENOUS | Status: DC

## 2019-05-24 MED ORDER — SODIUM CHLORIDE 0.9 % IV BOLUS: 1000.0000 mL | Freq: Once | INTRAVENOUS | Status: DC

## 2019-05-24 MED ORDER — FLUTICASONE PROPIONATE 50 MCG/ACT NA SUSP
1.0000 | Freq: Two times a day (BID) | NASAL | 1 refills | Status: DC
Start: 2019-05-24 — End: 2020-01-19

## 2019-05-24 MED ORDER — CIPROFLOXACIN HCL 250 MG PO TABS
250.0000 mg | ORAL_TABLET | Freq: Two times a day (BID) | ORAL | 0 refills | Status: DC
Start: 2019-05-24 — End: 2019-11-11

## 2019-05-24 NOTE — ED Provider Notes (Signed)
[facility] EMERGENCY DEPARTMENT Provider Note   CSN: [id number] Arrival date & time: 05/24/19  1033     History Chief Complaint  Patient presents with  . Weakness  . Sore Throat    [name] is a 86 y.o. female.  HPI She presents for evaluation of runny nose and sore throat.  Her daughter is also notes her urine had a bad odor to it.  Nasal congestion ongoing for weeks, not improving with Xyzal, allergy medication.  Her daughter has noticed some generalized weakness but no change in her behavior or behavior to communicate.  There has been no fever.  She has complained of headaches couple times but is not persistent.  She is taking her usual medications.  Today her daughter thought her right hand may have had gout because it hurt when she attempted to help her mother get up.  No known recent trauma.  She has a history of gout.  There are no other known modifying factors.    Past Medical History:  Diagnosis Date  . Anemia   . Arthritis    "fingers; back" (02/09/2013)  . Asthma   . CAD (coronary artery disease)    CABG surgery in 06/2005 for left main disease; normal EF; negative stress nuclear 08/2008  . Cerebrovascular disease    1993-CVA; 08/2008-mild atherosclerosis without focal stenosis  . Chronic back pain greater than 3 months duration    "nerve damage down into left leg" (02/09/2013)  . Chronic bronchitis ([place])    "haven't had it in last 2-3 years; used to get it q yr" (02/09/2013)  . COPD (chronic obstructive pulmonary disease) ([place])   . Dementia ([place])    "more than mild; having more short term memory loss recently; on Aricept" (02/09/2013)  . Diverticulosis   . Dizziness    chronic  . Family history of anesthesia complication    "makes daughter sick" (02/09/2013)  . Frequent PVCs   . Frequent UTI   . GERD (gastroesophageal reflux disease)   . [id number])    "sometimes weekly" (02/09/2013)  . High cholesterol   . Hypertension   . Mental disorder    mild dementia   . Orthostatic hypotension   . Osteopenia   . Pneumonia    "couple times; several years ago" (02/09/2013)  . Sinus bradycardia    s/p STJ Assurity dual chamber pacemaker by Dr Rayann Heman 02/2013  . Stroke ([place])    20-30 yrs ago  . Syncope     Negative even recorder dated 3/08  . Tobacco abuse    60 pack years; continuing    Patient Active Problem List   Diagnosis Date Noted  . Chronic pulmonary embolism with acute cor pulmonale (HCC)   . Hypothyroidism 05/26/2018  . Acute blood loss anemia 04/11/2015  . Chronic chest pain 04/11/2015  . Dementia ([place]) 04/11/2015  . Lower GI bleed 04/06/2015  . Pacemaker 05/27/2014  . Balance disorder 10/05/2013  . Dizziness of unknown cause 10/05/2013  . Difficulty in walking(719.7) 10/05/2013  . Muscle weakness (generalized) 10/05/2013  . Hypokalemia 09/21/2013  . Acute bronchitis 09/20/2013  . SIRS (systemic inflammatory response syndrome) ([place]) 09/20/2013  . Atypical chest pain 09/20/2013  . UTI (lower urinary tract infection) 09/20/2013  . Dyspnea 09/20/2013  . Anemia 02/09/2013  . Sick sinus syndrome ([place]) 01/27/2013  . Premature ventricular contraction 01/27/2013  . CAD (coronary artery disease) 01/27/2013  . Essential hypertension 01/27/2013  . Hx of falling, presenting hazards to health 12/21/2012  .  Laboratory test 05/09/2011  . Arteriosclerotic cardiovascular disease (ASCVD)   . Cerebrovascular disease   . Syncope   . Hyperlipidemia 05/07/2009  . Tobacco abuse 05/07/2009  . Orthostatic hypotension 05/07/2009  . GERD (gastroesophageal reflux disease) 09/19/2008  . Osteopenia 09/19/2008    Past Surgical History:  Procedure Laterality Date  . ABDOMINAL HYSTERECTOMY  1990  . APPENDECTOMY    . CARDIAC CATHETERIZATION  2007  . CATARACT EXTRACTION, BILATERAL    . COLONOSCOPY  10/10/2011   Procedure: COLONOSCOPY;  Surgeon: Rogene Houston, MD;  [facility];  Service: Endoscopy;  Laterality: N/A;  1230  . COLONOSCOPY N/A  04/12/2015   Procedure: COLONOSCOPY;  Surgeon: Rogene Houston, MD;  [facility];  Service: Endoscopy;  Laterality: N/A;  . CORONARY ARTERY BYPASS GRAFT  2007   Left main disease  . ESOPHAGOGASTRODUODENOSCOPY  10/10/2011   Procedure: ESOPHAGOGASTRODUODENOSCOPY (EGD);  Surgeon: Rogene Houston, MD;  [facility];  Service: Endoscopy;  Laterality: N/A;  . ESOPHAGOGASTRODUODENOSCOPY N/A 04/08/2015   Procedure: ESOPHAGOGASTRODUODENOSCOPY (EGD);  Surgeon: Rogene Houston, MD;  [facility];  Service: Endoscopy;  Laterality: N/A;  . HERNIA REPAIR    . PACEMAKER INSERTION  02/09/2013   STJ Assurity pacemaker implanted by Dr Rayann Heman for symptomatic bradycardia  . PERMANENT PACEMAKER INSERTION N/A 02/09/2013   Procedure: PERMANENT PACEMAKER INSERTION;  Surgeon: Coralyn Mark, MD;  Location: [place] CATH LAB;  Service: Cardiovascular;  Laterality: N/A;  . TONSILLECTOMY    . VENTRAL HERNIA REPAIR       OB History    Gravida  3   Para  3   Term  3   Preterm      AB      Living        SAB      TAB      Ectopic      Multiple      Live Births              Family History  Problem Relation Age of Onset  . Angina Mother   . Heart attack Mother   . Heart disease Other   . Arthritis Other   . Lung disease Other   . Asthma Other     Social History   Tobacco Use  . Smoking status: Former Smoker    Packs/day: 0.50    Years: 60.00    Pack years: 30.00    Types: Cigarettes    Start date: 12/12/1952    Quit date: 10/20/2006    Years since quitting: 12.6  . Smokeless tobacco: Never Used  . Tobacco comment: 02/09/2013 "quit smoking in ~ 2009"  Substance Use Topics  . Alcohol use: No    Alcohol/week: 0.0 standard drinks  . Drug use: No    Home Medications Prior to Admission medications   Medication Sig Start Date End Date Taking? Authorizing Provider  acetaminophen (TYLENOL) 325 MG tablet Take 650 mg by mouth every 6 (six) hours as needed for pain.  For pain    [provider]  allopurinol (ZYLOPRIM) 100 MG tablet Take 1 tablet by mouth daily. 05/22/18   [provider]  busPIRone (BUSPAR) 5 MG tablet Take 5 mg by mouth 2 (two) times daily.  04/14/15   [provider]  Cyanocobalamin (VITAMIN B-12 CR PO) Take 1 tablet by mouth every evening.     [provider]  donepezil (ARICEPT) 10 MG tablet Take 10 mg by  mouth at bedtime.     [provider]  levocetirizine (XYZAL) 5 MG tablet Take 5 mg by mouth every morning.  04/24/18   [provider]  levothyroxine (SYNTHROID, LEVOTHROID) 50 MCG tablet Take 50 mcg by mouth every morning.  08/12/17   [provider]  memantine (NAMENDA) 5 MG tablet Take 5 mg by mouth 2 (two) times daily.  10/09/17   [provider]  Multiple Vitamins-Minerals (PRESERVISION AREDS 2+MULTI VIT) CAPS Take 1 capsule by mouth 2 (two) times a day.    [provider]  nitroGLYCERIN (NITROSTAT) 0.4 MG SL tablet PLACE ONE TABLET UNDER THE TONGUE EVERY 5 MINUTES AS NEEDED FOR CHESTPAIN DO NOT EXCEED 3 IN 24 HOURS Patient taking differently: Place 0.4 mg under the tongue every 5 (five) minutes as needed for chest pain.  04/09/17   Herminio Commons, MD  omeprazole (PRILOSEC) 20 MG capsule Take 2 capsules (40 mg total) by mouth daily. Patient taking differently: Take 20 mg by mouth every morning.  04/09/15   Regalado, [name], MD    Allergies    Aleve [naproxen sodium], Ativan [lorazepam], Cephalexin, Contrast media [iodinated diagnostic agents], Famvir [famciclovir], Haldol [haloperidol lactate], Iohexol, Ketek [telithromycin], Penicillins, Shellfish allergy, and Sulfa antibiotics  Review of Systems   Review of Systems  All other systems reviewed and are negative.   Physical Exam Updated Vital Signs BP (!) 160/76 (BP Location: Right Arm)   Pulse (!) 59   Resp 18   Ht 5\' 3"  (1.6 m)   Wt 56.7 kg   SpO2 100%   BMI 22.14 kg/m   Physical  Exam Vitals and nursing note reviewed.  Constitutional:      General: She is not in acute distress.    Appearance: She is well-developed. She is not ill-appearing, toxic-appearing or diaphoretic.     Comments: Elderly, frail  HENT:     Head: Normocephalic and atraumatic.     Right Ear: External ear normal.     Left Ear: External ear normal.     Nose: No congestion or rhinorrhea.     Mouth/Throat:     Mouth: Mucous membranes are moist. No oral lesions.     Pharynx: No pharyngeal swelling.  Eyes:     Conjunctiva/sclera: Conjunctivae normal.     Pupils: Pupils are equal, round, and reactive to light.  Neck:     Trachea: Phonation normal.  Cardiovascular:     Rate and Rhythm: Normal rate and regular rhythm.     Heart sounds: Normal heart sounds.  Pulmonary:     Effort: Pulmonary effort is normal.     Breath sounds: Normal breath sounds.  Abdominal:     Palpations: Abdomen is soft.     Tenderness: There is no abdominal tenderness.  Musculoskeletal:        General: No swelling or tenderness. Normal range of motion.     Cervical back: Normal range of motion and neck supple.     Right lower leg: Edema present.     Left lower leg: Edema present.     Comments: 1+ lower leg edema bilaterally.  Skin:    General: Skin is warm and dry.  Neurological:     Mental Status: She is alert.     Cranial Nerves: No cranial nerve deficit.     Motor: No abnormal muscle tone.     Coordination: Coordination normal.  Psychiatric:        Mood and Affect: Mood normal.  Behavior: Behavior normal.     ED Results / Procedures / Treatments   Labs (all labs ordered are listed, but only abnormal results are displayed) Labs Reviewed - No data to display  EKG None  Radiology No results found.  Procedures Procedures (including critical care time)  Medications Ordered in ED Medications - No data to display  ED Course  I have reviewed the triage vital signs and the nursing  notes.  Pertinent labs & imaging results that were available during my care of the patient were reviewed by me and considered in my medical decision making (see chart for details).  Clinical Course as of May 24 1515  Mon May 24, 2019  1504 Pulse Rate(!): 59 [EW]    Clinical Course User Index [EW] Daleen Bo, MD   MDM Rules/Calculators/A&P                       Patient Vitals for the past 24 hrs:  BP Pulse Resp SpO2 Height Weight  05/24/19 1100 -- -- -- -- 5\' 3"  (1.6 m) 56.7 kg  05/24/19 1059 (!) 160/76 (!) 59 18 100 % -- --    3:17 PM Reevaluation with update and discussion. After initial assessment and treatment, an updated evaluation reveals no change in clinical status, findings discussed with daughter and all questions answered. Daleen Bo   Medical Decision Making:  This patient is presenting for evaluation of sore throat, rhinorrhea, and foul-smelling urine, which does require a range of treatment options, and is a complaint that involves a moderate risk of morbidity and mortality. The differential diagnoses include nonspecific malaise, UTI, seasonal allergy, sinusitis. I decided to review old records, and in summary elderly patient without recent illnesses and chronic stable medical problems; she has moderate dementia..  I obtained additional historical information from her daughter at the bedside.  Clinical Laboratory Tests Ordered, included Urinalysis. Review indicates urinary tract infection. Chest x-ray ordered and images reviewed.  No infiltrate or CHF  Critical Interventions-clinical evaluation, urinary testing, observation reassessment  After These Interventions, the Patient was reevaluated and was found improved and stable for discharge  CRITICAL CARE-no Performed by: Daleen Bo  Nursing Notes Reviewed/ Care Coordinated Applicable Imaging Reviewed Interpretation of Laboratory Data incorporated into ED treatment  The patient appears reasonably  screened and/or stabilized for discharge and I doubt any other medical condition or other [facility] requiring further screening, evaluation, or treatment in the ED at this time prior to discharge.  Plan: Home Medications-continue usual medications; Home Treatments-rest, fluids, gradual advance activity; return here if the recommended treatment, does not improve the symptoms; Recommended follow up-PCP follow-up 1 week and as needed     Final Clinical Impression(s) / ED Diagnoses Final diagnoses:  Acute cystitis without hematuria  Seasonal allergic rhinitis, unspecified trigger    Rx / DC Orders ED Discharge Orders         Ordered    ciprofloxacin (CIPRO) 250 MG tablet  Every 12 hours     05/24/19 1510    fluticasone (FLONASE) 50 MCG/ACT nasal spray  2 times daily     05/24/19 1510           Daleen Bo, MD 05/25/19 2321

## 2019-05-24 NOTE — ED Triage Notes (Signed)
Daughter reports runny nose and cough for  " a while"  But report sore throat, hoarseness, nausea, headache, and generalized weakness x 2 days.  Denies fevers at home.

## 2019-05-24 NOTE — Discharge Instructions (Addendum)
We are prescribing an antibiotic for UTI and no spray for seasonal allergies.  Continue to encourage her to drink plenty of water.  Follow-up with your doctor, next week for checkup.

## 2019-06-01 DIAGNOSIS — N342 Other urethritis: Secondary | ICD-10-CM | POA: Diagnosis not present

## 2019-06-02 DIAGNOSIS — M5136 Other intervertebral disc degeneration, lumbar region: Secondary | ICD-10-CM | POA: Diagnosis not present

## 2019-06-02 DIAGNOSIS — E559 Vitamin D deficiency, unspecified: Secondary | ICD-10-CM | POA: Diagnosis not present

## 2019-06-02 DIAGNOSIS — Z0000 Encounter for general adult medical examination without abnormal findings: Secondary | ICD-10-CM | POA: Diagnosis not present

## 2019-06-02 DIAGNOSIS — N390 Urinary tract infection, site not specified: Secondary | ICD-10-CM | POA: Diagnosis not present

## 2019-06-02 DIAGNOSIS — Z681 Body mass index (BMI) 19 or less, adult: Secondary | ICD-10-CM | POA: Diagnosis not present

## 2019-06-02 DIAGNOSIS — Z1389 Encounter for screening for other disorder: Secondary | ICD-10-CM | POA: Diagnosis not present

## 2019-06-07 DIAGNOSIS — I25119 Atherosclerotic heart disease of native coronary artery with unspecified angina pectoris: Secondary | ICD-10-CM | POA: Diagnosis not present

## 2019-06-07 DIAGNOSIS — M1991 Primary osteoarthritis, unspecified site: Secondary | ICD-10-CM | POA: Diagnosis not present

## 2019-06-07 DIAGNOSIS — E7849 Other hyperlipidemia: Secondary | ICD-10-CM | POA: Diagnosis not present

## 2019-06-07 DIAGNOSIS — E039 Hypothyroidism, unspecified: Secondary | ICD-10-CM | POA: Diagnosis not present

## 2019-06-20 NOTE — Progress Notes (Signed)
Virtual Visit via Telephone Note   This visit type was conducted due to national recommendations for restrictions regarding the COVID-19 Pandemic (e.g. social distancing) in an effort to limit this patient's exposure and mitigate transmission in our community.  Due to her co-morbid illnesses, this patient is at least at moderate risk for complications without adequate follow up.  This format is felt to be most appropriate for this patient at this time.  The patient did not have access to video technology/had technical difficulties with video requiring transitioning to audio format only (telephone).  All issues noted in this document were discussed and addressed.  No physical exam could be performed with this format.  Please refer to the patient's chart for her  consent to telehealth for [facility].   The patient was identified using 2 identifiers.  Date:  06/21/2019   ID:  [name], DOB 24-Mar-1925, MRN [id number]  Patient Location: Home Provider Location: Office  PCP:  Sharilyn Sites, MD  Cardiologist:  Kate Sable, MD  Electrophysiologist:  Cristopher Peru, MD   Evaluation Performed:  Follow-Up Visit     TELEMEDICINE PHONE VISIT  Cardiology Office Note  Chief complaint: Pedal edema   Date: 06/20/2019   ID: [name], DOB [birth year], MRN [id number]  PCP:  Sharilyn Sites, MD  Cardiologist:  Kate Sable, MD Electrophysiologist:  Cristopher Peru, MD   Chief Complaint: F/U CAD (S/P CABG) CVA, PAF, SSS.  History of Present Illness: [name] is a 86 y.o. female with a history of CAD, CVA, sick sinus syndrome.  History of bypass surgery 2007 for left main disease.  CVA in 1993, COPD ( 60 pack year smoker), dementia, sick sinus syndrome with placement of [employer] Jude dual-chamber pacemaker by Dr. Rayann Heman 2015. Hx of falls.  Last encounter with Dr. Lovena Le via telemedicine October 2020.  Patient was accompanied by her daughter who is primary historian due to patient's  history of dementia with memory becoming worse.  She had no recent issues with syncope. She was maintaining sinus rhythm having no issues with bleeding.  Her pacemaker was working normally.  Daughter states dementia was progression especially anxiety associated with the progression.  Recent ER visit  05/24/2019 for runny nose and bad odor when urinating. UTI and started on Cipro. Fluticasone nasal spray. CXR was negative for acute process.   Spoke to the daughter by phone today.  Patient states her mother is essentially noncommunicative for the most part, occasionally she speaks.  Daughter states the only issue with her mother today is she has some unilateral pedal edema.  She denies any other mother's symptoms.  Her mother has had a previous DVT in the past where she had unilateral lower extremity edema which was significant.  She states the edema is limited to her foot and is noticeable on the dorsal surface.  She was treated with anticoagulants for DVT in the past and had rectal bleeding when treated.  She states the edema becomes a little worse as the day wears on especially if her mother is sitting up in a chair.  We spoke about possible Covid vaccination in the setting of a skilled nursing facility.  Patient is basically isolated at home without any significant exposure to potential contacts which might transmit the virus.  Daughter is reticent for her mother to have the vaccine given multiple drug interactions/allergies.    Past Medical History:  Diagnosis Date  . Anemia   . Arthritis    "fingers; back" (  02/09/2013)  . Asthma   . CAD (coronary artery disease)    CABG surgery in 06/2005 for left main disease; normal EF; negative stress nuclear 08/2008  . Cerebrovascular disease    1993-CVA; 08/2008-mild atherosclerosis without focal stenosis  . Chronic back pain greater than 3 months duration    "nerve damage down into left leg" (02/09/2013)  . Chronic bronchitis ([place])    "haven't had it in last  2-3 years; used to get it q yr" (02/09/2013)  . COPD (chronic obstructive pulmonary disease) ([place])   . Dementia ([place])    "more than mild; having more short term memory loss recently; on Aricept" (02/09/2013)  . Diverticulosis   . Dizziness    chronic  . Family history of anesthesia complication    "makes daughter sick" (02/09/2013)  . Frequent PVCs   . Frequent UTI   . GERD (gastroesophageal reflux disease)   . AOZHYQMV(784.6)    "sometimes weekly" (02/09/2013)  . High cholesterol   . Hypertension   . Mental disorder    mild dementia  . Orthostatic hypotension   . Osteopenia   . Pneumonia    "couple times; several years ago" (02/09/2013)  . Sinus bradycardia    s/p STJ Assurity dual chamber pacemaker by Dr Rayann Heman 02/2013  . Stroke ([place])    20-30 yrs ago  . Syncope     Negative even recorder dated 3/08  . Tobacco abuse    60 pack years; continuing    Past Surgical History:  Procedure Laterality Date  . ABDOMINAL HYSTERECTOMY  1990  . APPENDECTOMY    . CARDIAC CATHETERIZATION  2007  . CATARACT EXTRACTION, BILATERAL    . COLONOSCOPY  10/10/2011   Procedure: COLONOSCOPY;  Surgeon: Rogene Houston, MD;  [facility];  Service: Endoscopy;  Laterality: N/A;  1230  . COLONOSCOPY N/A 04/12/2015   Procedure: COLONOSCOPY;  Surgeon: Rogene Houston, MD;  Location: [facility];  Service: Endoscopy;  Laterality: N/A;  . CORONARY ARTERY BYPASS GRAFT  2007   Left main disease  . ESOPHAGOGASTRODUODENOSCOPY  10/10/2011   Procedure: ESOPHAGOGASTRODUODENOSCOPY (EGD);  Surgeon: Rogene Houston, MD;  [facility];  Service: Endoscopy;  Laterality: N/A;  . ESOPHAGOGASTRODUODENOSCOPY N/A 04/08/2015   Procedure: ESOPHAGOGASTRODUODENOSCOPY (EGD);  Surgeon: Rogene Houston, MD;  Location: [facility];  Service: Endoscopy;  Laterality: N/A;  . HERNIA REPAIR    . PACEMAKER INSERTION  02/09/2013   STJ Assurity pacemaker implanted by Dr Rayann Heman for symptomatic bradycardia  . PERMANENT  PACEMAKER INSERTION N/A 02/09/2013   Procedure: PERMANENT PACEMAKER INSERTION;  Surgeon: Coralyn Mark, MD;  Location: [facility];  Service: Cardiovascular;  Laterality: N/A;  . TONSILLECTOMY    . VENTRAL HERNIA REPAIR      Current Outpatient Medications  Medication Sig Dispense Refill  . acetaminophen (TYLENOL) 325 MG tablet Take 650 mg by mouth every 6 (six) hours as needed for pain. For pain    . allopurinol (ZYLOPRIM) 100 MG tablet Take 1 tablet by mouth daily.    Marland Kitchen bismuth subsalicylate (PEPTO BISMOL) 262 MG chewable tablet Chew 262 mg by mouth 4 (four) times daily as needed for indigestion.    . busPIRone (BUSPAR) 5 MG tablet Take 5 mg by mouth 2 (two) times daily.     . ciprofloxacin (CIPRO) 250 MG tablet Take 1 tablet (250 mg total) by mouth every 12 (twelve) hours. 10 tablet 0  . donepezil (ARICEPT) 10 MG tablet Take 10  mg by mouth at bedtime.     . fluticasone (FLONASE) 50 MCG/ACT nasal spray Place 1 spray into both nostrils in the morning and at bedtime. 1 g 1  . levocetirizine (XYZAL) 5 MG tablet Take 5 mg by mouth every evening.     Marland Kitchen levothyroxine (SYNTHROID, LEVOTHROID) 50 MCG tablet Take 50 mcg by mouth every morning.     . memantine (NAMENDA) 5 MG tablet Take 5 mg by mouth 2 (two) times daily.     . Multiple Vitamins-Minerals (PRESERVISION AREDS 2+MULTI VIT) CAPS Take 1 capsule by mouth 2 (two) times daily as needed.     . nitroGLYCERIN (NITROSTAT) 0.4 MG SL tablet PLACE ONE TABLET UNDER THE TONGUE EVERY 5 MINUTES AS NEEDED FOR CHESTPAIN DO NOT EXCEED 3 IN 24 HOURS (Patient taking differently: Place 0.4 mg under the tongue every 5 (five) minutes as needed for chest pain. ) 25 tablet 3  . omeprazole (PRILOSEC) 20 MG capsule Take 2 capsules (40 mg total) by mouth daily. (Patient taking differently: Take 20 mg by mouth every morning. ) 90 capsule 3   No current facility-administered medications for this visit.   Allergies:  Aleve [naproxen sodium], Ativan [lorazepam],  Cephalexin, Contrast media [iodinated diagnostic agents], Famvir [famciclovir], Haldol [haloperidol lactate], Iohexol, Ketek [telithromycin], Penicillins, Shellfish allergy, and Sulfa antibiotics   Social History: The patient  reports that she quit smoking about 12 years ago. Her smoking use included cigarettes. She started smoking about 66 years ago. She has a 30.00 pack-year smoking history. She has never used smokeless tobacco. She reports that she does not drink alcohol and does not use drugs.   Family History: The patient's family history includes Angina in her mother; Arthritis in an other family member; Asthma in an other family member; Heart attack in her mother; Heart disease in an other family member; Lung disease in an other family member.   ROS:  Please see the history of present illness. Otherwise, complete review of systems is positive for none.  All other systems are reviewed and negative.   Physical Exam: VS:  There were no vitals taken for this visit., BMI There is no height or weight on file to calculate BMI.  Patient's daughter is the primary historian.  Patient has significant dementia and memory loss.  Wt Readings from Last 3 Encounters:  05/24/19 125 lb (56.7 kg)  06/02/18 113 lb 15.7 oz (51.7 kg)  05/28/18 113 lb 15.7 oz (51.7 kg)     ECG:    Recent Labwork: No results found for requested labs within last 8760 hours.     Component Value Date/Time   CHOL 144 09/20/2013 1042   TRIG 50 09/20/2013 1042   HDL 84 09/20/2013 1042   CHOLHDL 1.7 09/20/2013 1042   VLDL 10 09/20/2013 1042   LDLCALC 50 09/20/2013 1042    Other Studies Reviewed Today:   Assessment and Plan:  1. CAD in native artery   2. Cardiac pacemaker in situ   3. Essential hypertension   4. Cerebrovascular disease    1. CAD in native artery Daughter states patient has no significant anginal or exertional symptoms but is not very active on a daily basis. She has advancing dementia and memory  loss.   2. Cardiac pacemaker in situ Sees Dr. Lovena Le for device management.  Continue to follow with Dr. Lovena Le.  3. Essential hypertension Daughter states diastolic blood pressure today was 97.  States her blood pressure usually goes up when she is anxious  or agitated secondary to her dementia.  States BP usually goes down after anxious episode resolves.  4. Cerebrovascular disease History of CVA in the past.  No new stroke or TIA-like symptoms.  5. LE edema Daughter's concern was some lower extremity unilateral edema in her mother's foot.  She is concerned this could be possible DVT.  Upon further discussion daughter states that when mother had previous DVT her entire leg was swollen.  She states only her foot is swollen and not significantly so.  Advised her to monitor the patient's edema and if it continues to increase and spread to the entire leg, we may need to address by checking a lower extremity venous duplex.  Daughter verbalizes understanding.   Medication Adjustments/Labs and Tests Ordered: Current medicines are reviewed at length with the patient today.  Concerns regarding medicines are outlined above.   Disposition: Follow-up with Dr. Bronson Ing or APP 6 months.  Time spent with patient on phone visit was 15 minutes   Signed, Levell July, NP 06/20/2019 7:22 PM    [facility] [address], [place], [place] [address] Phone: [phone]; Fax: [phone]

## 2019-06-21 ENCOUNTER — Telehealth (INDEPENDENT_AMBULATORY_CARE_PROVIDER_SITE_OTHER): Payer: Medicare Other | Admitting: Family Medicine

## 2019-06-21 ENCOUNTER — Other Ambulatory Visit: Payer: Self-pay

## 2019-06-21 ENCOUNTER — Encounter: Payer: Self-pay | Admitting: Family Medicine

## 2019-06-21 DIAGNOSIS — I10 Essential (primary) hypertension: Secondary | ICD-10-CM | POA: Diagnosis not present

## 2019-06-21 DIAGNOSIS — R600 Localized edema: Secondary | ICD-10-CM | POA: Diagnosis not present

## 2019-06-21 DIAGNOSIS — I679 Cerebrovascular disease, unspecified: Secondary | ICD-10-CM | POA: Diagnosis not present

## 2019-06-21 DIAGNOSIS — Z950 Presence of cardiac pacemaker: Secondary | ICD-10-CM | POA: Diagnosis not present

## 2019-06-21 DIAGNOSIS — I2510 Atherosclerotic heart disease of native coronary artery without angina pectoris: Secondary | ICD-10-CM

## 2019-06-21 NOTE — Patient Instructions (Signed)
Medication Instructions:  Your physician recommends that you continue on your current medications as directed. Please refer to the Current Medication list given to you today.  *If you need a refill on your cardiac medications before your next appointment, please call your pharmacy*   Lab Work: None today If you have labs (blood work) drawn today and your tests are completely normal, you will receive your results only by: Marland Kitchen MyChart Message (if you have MyChart) OR . A paper copy in the mail If you have any lab test that is abnormal or we need to change your treatment, we will call you to review the results.   Testing/Procedures: Nopne today   Follow-Up: At [facility], you and your health needs are our priority.  As part of our continuing mission to provide you with exceptional heart care, we have created designated Provider Care Teams.  These Care Teams include your primary Cardiologist (physician) and Advanced Practice Providers (APPs -  Physician Assistants and Nurse Practitioners) who all work together to provide you with the care you need, when you need it.  We recommend signing up for the patient portal called "MyChart".  Sign up information is provided on this After Visit Summary.  MyChart is used to connect with patients for Virtual Visits (Telemedicine).  Patients are able to view lab/test results, encounter notes, upcoming appointments, etc.  Non-urgent messages can be sent to your provider as well.   To learn more about what you can do with MyChart, go to [email or web address].    Your next appointment:   6 month(s)  The format for your next appointment:   Virtual Visit   Provider:   Bernerd Pho, PA-c, or Ermalinda Barrios, PA-C   Other Instructions None today

## 2019-06-22 DIAGNOSIS — E538 Deficiency of other specified B group vitamins: Secondary | ICD-10-CM | POA: Diagnosis not present

## 2019-07-07 DIAGNOSIS — E7849 Other hyperlipidemia: Secondary | ICD-10-CM | POA: Diagnosis not present

## 2019-07-07 DIAGNOSIS — I25119 Atherosclerotic heart disease of native coronary artery with unspecified angina pectoris: Secondary | ICD-10-CM | POA: Diagnosis not present

## 2019-07-07 DIAGNOSIS — M1991 Primary osteoarthritis, unspecified site: Secondary | ICD-10-CM | POA: Diagnosis not present

## 2019-07-07 DIAGNOSIS — E039 Hypothyroidism, unspecified: Secondary | ICD-10-CM | POA: Diagnosis not present

## 2019-08-02 NOTE — Telephone Encounter (Signed)
Disregard opened in error °

## 2019-08-03 ENCOUNTER — Ambulatory Visit (INDEPENDENT_AMBULATORY_CARE_PROVIDER_SITE_OTHER): Payer: Medicare Other | Admitting: *Deleted

## 2019-08-03 DIAGNOSIS — I495 Sick sinus syndrome: Secondary | ICD-10-CM | POA: Diagnosis not present

## 2019-08-04 LAB — CUP PACEART REMOTE DEVICE CHECK
Battery Remaining Longevity: 127 mo
Battery Remaining Percentage: 95.5 %
Battery Voltage: 2.98 V
Brady Statistic AP VP Percent: 1 %
Brady Statistic AP VS Percent: 42 %
Brady Statistic AS VP Percent: 1 %
Brady Statistic AS VS Percent: 56 %
Brady Statistic RA Percent Paced: 38 %
Brady Statistic RV Percent Paced: 1 %
Date Time Interrogation Session: 20210727050830
Implantable Lead Implant Date: 20150203
Implantable Lead Implant Date: 20150203
Implantable Lead Location: 753859
Implantable Lead Location: 753860
Implantable Lead Model: 1948
Implantable Pulse Generator Implant Date: 20150203
Lead Channel Impedance Value: 530 Ohm
Lead Channel Impedance Value: 630 Ohm
Lead Channel Pacing Threshold Amplitude: 0.75 V
Lead Channel Pacing Threshold Amplitude: 1 V
Lead Channel Pacing Threshold Pulse Width: 0.4 ms
Lead Channel Pacing Threshold Pulse Width: 0.4 ms
Lead Channel Sensing Intrinsic Amplitude: 11.7 mV
Lead Channel Sensing Intrinsic Amplitude: 2.9 mV
Lead Channel Setting Pacing Amplitude: 2 V
Lead Channel Setting Pacing Amplitude: 2.5 V
Lead Channel Setting Pacing Pulse Width: 0.4 ms
Lead Channel Setting Sensing Sensitivity: 2 mV
Pulse Gen Model: 2240
Pulse Gen Serial Number: 7586568

## 2019-08-06 NOTE — Progress Notes (Signed)
Remote pacemaker transmission.   

## 2019-09-07 DIAGNOSIS — M1991 Primary osteoarthritis, unspecified site: Secondary | ICD-10-CM | POA: Diagnosis not present

## 2019-09-07 DIAGNOSIS — I25119 Atherosclerotic heart disease of native coronary artery with unspecified angina pectoris: Secondary | ICD-10-CM | POA: Diagnosis not present

## 2019-09-07 DIAGNOSIS — E7849 Other hyperlipidemia: Secondary | ICD-10-CM | POA: Diagnosis not present

## 2019-09-07 DIAGNOSIS — E039 Hypothyroidism, unspecified: Secondary | ICD-10-CM | POA: Diagnosis not present

## 2019-10-07 DIAGNOSIS — M1991 Primary osteoarthritis, unspecified site: Secondary | ICD-10-CM | POA: Diagnosis not present

## 2019-10-07 DIAGNOSIS — E039 Hypothyroidism, unspecified: Secondary | ICD-10-CM | POA: Diagnosis not present

## 2019-10-07 DIAGNOSIS — I25119 Atherosclerotic heart disease of native coronary artery with unspecified angina pectoris: Secondary | ICD-10-CM | POA: Diagnosis not present

## 2019-11-02 ENCOUNTER — Ambulatory Visit (INDEPENDENT_AMBULATORY_CARE_PROVIDER_SITE_OTHER): Payer: Medicare Other

## 2019-11-02 DIAGNOSIS — I495 Sick sinus syndrome: Secondary | ICD-10-CM | POA: Diagnosis not present

## 2019-11-03 LAB — CUP PACEART REMOTE DEVICE CHECK
Battery Remaining Longevity: 120 mo
Battery Remaining Percentage: 95.5 %
Battery Voltage: 2.96 V
Brady Statistic AP VP Percent: 1 %
Brady Statistic AP VS Percent: 43 %
Brady Statistic AS VP Percent: 1 %
Brady Statistic AS VS Percent: 55 %
Brady Statistic RA Percent Paced: 39 %
Brady Statistic RV Percent Paced: 1 %
Date Time Interrogation Session: 20211026195024
Implantable Lead Implant Date: 20150203
Implantable Lead Implant Date: 20150203
Implantable Lead Location: 753859
Implantable Lead Location: 753860
Implantable Lead Model: 1948
Implantable Pulse Generator Implant Date: 20150203
Lead Channel Impedance Value: 510 Ohm
Lead Channel Impedance Value: 610 Ohm
Lead Channel Pacing Threshold Amplitude: 0.75 V
Lead Channel Pacing Threshold Amplitude: 1 V
Lead Channel Pacing Threshold Pulse Width: 0.4 ms
Lead Channel Pacing Threshold Pulse Width: 0.4 ms
Lead Channel Sensing Intrinsic Amplitude: 11.3 mV
Lead Channel Sensing Intrinsic Amplitude: 2.6 mV
Lead Channel Setting Pacing Amplitude: 2 V
Lead Channel Setting Pacing Amplitude: 2.5 V
Lead Channel Setting Pacing Pulse Width: 0.4 ms
Lead Channel Setting Sensing Sensitivity: 2 mV
Pulse Gen Model: 2240
Pulse Gen Serial Number: 7586568

## 2019-11-05 NOTE — Progress Notes (Signed)
Remote pacemaker transmission.   

## 2019-11-06 DIAGNOSIS — E039 Hypothyroidism, unspecified: Secondary | ICD-10-CM | POA: Diagnosis not present

## 2019-11-06 DIAGNOSIS — I25119 Atherosclerotic heart disease of native coronary artery with unspecified angina pectoris: Secondary | ICD-10-CM | POA: Diagnosis not present

## 2019-11-06 DIAGNOSIS — M1991 Primary osteoarthritis, unspecified site: Secondary | ICD-10-CM | POA: Diagnosis not present

## 2019-11-08 ENCOUNTER — Telehealth: Payer: Self-pay | Admitting: Internal Medicine

## 2019-11-08 NOTE — Telephone Encounter (Signed)
R/s to virtual

## 2019-11-08 NOTE — Telephone Encounter (Signed)
New message    Patient daughter stopped in wants to know if they can do appt virtually since her mother has been placed in hospice

## 2019-11-10 ENCOUNTER — Ambulatory Visit: Payer: Medicare Other | Admitting: Internal Medicine

## 2019-11-11 ENCOUNTER — Telehealth: Payer: Self-pay | Admitting: *Deleted

## 2019-11-11 ENCOUNTER — Telehealth (INDEPENDENT_AMBULATORY_CARE_PROVIDER_SITE_OTHER): Payer: Medicare Other | Admitting: Internal Medicine

## 2019-11-11 ENCOUNTER — Encounter: Payer: Self-pay | Admitting: Internal Medicine

## 2019-11-11 ENCOUNTER — Other Ambulatory Visit: Payer: Self-pay

## 2019-11-11 DIAGNOSIS — I2510 Atherosclerotic heart disease of native coronary artery without angina pectoris: Secondary | ICD-10-CM | POA: Diagnosis not present

## 2019-11-11 DIAGNOSIS — I495 Sick sinus syndrome: Secondary | ICD-10-CM | POA: Diagnosis not present

## 2019-11-11 DIAGNOSIS — Z950 Presence of cardiac pacemaker: Secondary | ICD-10-CM

## 2019-11-11 NOTE — Telephone Encounter (Signed)
°  Patient Consent for Virtual Visit         [name] has provided verbal consent on 11/11/2019 for a virtual visit (video or telephone).   CONSENT FOR VIRTUAL VISIT FOR:  [name]  By participating in this virtual visit I agree to the following:  I hereby voluntarily request, consent and authorize [facility] and its employed or contracted physicians, physician assistants, nurse practitioners or other licensed health care professionals (the Practitioner), to provide me with telemedicine health care services (the Services") as deemed necessary by the treating Practitioner. I acknowledge and consent to receive the Services by the Practitioner via telemedicine. I understand that the telemedicine visit will involve communicating with the Practitioner through live audiovisual communication technology and the disclosure of certain medical information by electronic transmission. I acknowledge that I have been given the opportunity to request an in-person assessment or other available alternative prior to the telemedicine visit and am voluntarily participating in the telemedicine visit.  I understand that I have the right to withhold or withdraw my consent to the use of telemedicine in the course of my care at any time, without affecting my right to future care or treatment, and that the Practitioner or I may terminate the telemedicine visit at any time. I understand that I have the right to inspect all information obtained and/or recorded in the course of the telemedicine visit and may receive copies of available information for a reasonable fee.  I understand that some of the potential risks of receiving the Services via telemedicine include:   Delay or interruption in medical evaluation due to technological equipment failure or disruption;  Information transmitted may not be sufficient (e.g. poor resolution of images) to allow for appropriate medical decision making by the Practitioner;  and/or   In rare instances, security protocols could fail, causing a breach of personal health information.  Furthermore, I acknowledge that it is my responsibility to provide information about my medical history, conditions and care that is complete and accurate to the best of my ability. I acknowledge that Practitioner's advice, recommendations, and/or decision may be based on factors not within their control, such as incomplete or inaccurate data provided by me or distortions of diagnostic images or specimens that may result from electronic transmissions. I understand that the practice of medicine is not an exact science and that Practitioner makes no warranties or guarantees regarding treatment outcomes. I acknowledge that a copy of this consent can be made available to me via my patient portal ([facility]), or I can request a printed copy by calling the office of [facility].    I understand that my insurance will be billed for this visit.   I have read or had this consent read to me.  I understand the contents of this consent, which adequately explains the benefits and risks of the Services being provided via telemedicine.   I have been provided ample opportunity to ask questions regarding this consent and the Services and have had my questions answered to my satisfaction.  I give my informed consent for the services to be provided through the use of telemedicine in my medical care

## 2019-11-11 NOTE — Patient Instructions (Signed)
Medication Instructions:  Your physician recommends that you continue on your current medications as directed. Please refer to the Current Medication list given to you today.  *If you need a refill on your cardiac medications before your next appointment, please call your pharmacy*   Lab Work: NONE   If you have labs (blood work) drawn today and your tests are completely normal, you will receive your results only by: MyChart Message (if you have MyChart) OR A paper copy in the mail If you have any lab test that is abnormal or we need to change your treatment, we will call you to review the results.   Testing/Procedures: NONE    Follow-Up: At [facility], you and your health needs are our priority.  As part of our continuing mission to provide you with exceptional heart care, we have created designated Provider Care Teams.  These Care Teams include your primary Cardiologist (physician) and Advanced Practice Providers (APPs -  Physician Assistants and Nurse Practitioners) who all work together to provide you with the care you need, when you need it.  We recommend signing up for the patient portal called "MyChart".  Sign up information is provided on this After Visit Summary.  MyChart is used to connect with patients for Virtual Visits (Telemedicine).  Patients are able to view lab/test results, encounter notes, upcoming appointments, etc.  Non-urgent messages can be sent to your provider as well.   To learn more about what you can do with MyChart, go to [email or web address].    Your next appointment:   1 year(s)  The format for your next appointment:   In Person  Provider:   Dr. Taylor    Other Instructions Thank you for choosing [facility]!    

## 2019-11-11 NOTE — Progress Notes (Signed)
Electrophysiology TeleHealth Note   Due to national recommendations of social distancing due to COVID 19, an audio/video telehealth visit is felt to be most appropriate for this patient at this time.  See MyChart message from today for the patient's consent to telehealth for [facility].   Date:  11/11/2019   ID:  [name], DOB 06-07-1925, MRN [id number]  Location: patient's home  Provider location: [address] [place]  Evaluation Performed: Follow-up visit  PCP:  Sharilyn Sites, MD  Cardiologist:  No primary care provider on file.  Electrophysiologist:  Dr Lovena Le  Chief Complaint:  "Shes not doing as well today."  History of Present Illness:    [name] is a 86 y.o. female who presents via audio/video conferencing for a telehealth visit today.  The history is provided by her daughter. She is a pleasant 55 yo woman with dementia and CAD, s/p CABG, who is s/p PPM insertion secondary to sinus node dysfunction. Her daughter notes that she is a bit more agitated. No chest pain. She has not had syncope. She is incontinent.  Past Medical History:  Diagnosis Date  . Anemia   . Arthritis    "fingers; back" (02/09/2013)  . Asthma   . CAD (coronary artery disease)    CABG surgery in 06/2005 for left main disease; normal EF; negative stress nuclear 08/2008  . Cerebrovascular disease    1993-CVA; 08/2008-mild atherosclerosis without focal stenosis  . Chronic back pain greater than 3 months duration    "nerve damage down into left leg" (02/09/2013)  . Chronic bronchitis ([place])    "haven't had it in last 2-3 years; used to get it q yr" (02/09/2013)  . COPD (chronic obstructive pulmonary disease) ([place])   . Dementia ([place])    "more than mild; having more short term memory loss recently; on Aricept" (02/09/2013)  . Diverticulosis   . Dizziness    chronic  . Family history of anesthesia complication    "makes daughter sick" (02/09/2013)  . Frequent PVCs   . Frequent  UTI   . GERD (gastroesophageal reflux disease)   . VCBSWHQP(591.6)    "sometimes weekly" (02/09/2013)  . High cholesterol   . Hypertension   . Mental disorder    mild dementia  . Orthostatic hypotension   . Osteopenia   . Pneumonia    "couple times; several years ago" (02/09/2013)  . Sinus bradycardia    s/p STJ Assurity dual chamber pacemaker by Dr Rayann Heman 02/2013  . Stroke ([place])    20-30 yrs ago  . Syncope     Negative even recorder dated 3/08  . Tobacco abuse    60 pack years; continuing    Past Surgical History:  Procedure Laterality Date  . ABDOMINAL HYSTERECTOMY  1990  . APPENDECTOMY    . CARDIAC CATHETERIZATION  2007  . CATARACT EXTRACTION, BILATERAL    . COLONOSCOPY  10/10/2011   Procedure: COLONOSCOPY;  Surgeon: Rogene Houston, MD;  [facility];  Service: Endoscopy;  Laterality: N/A;  1230  . COLONOSCOPY N/A 04/12/2015   Procedure: COLONOSCOPY;  Surgeon: Rogene Houston, MD;  [facility];  Service: Endoscopy;  Laterality: N/A;  . CORONARY ARTERY BYPASS GRAFT  2007   Left main disease  . ESOPHAGOGASTRODUODENOSCOPY  10/10/2011   Procedure: ESOPHAGOGASTRODUODENOSCOPY (EGD);  Surgeon: Rogene Houston, MD;  [facility];  Service: Endoscopy;  Laterality: N/A;  . ESOPHAGOGASTRODUODENOSCOPY N/A 04/08/2015   Procedure:  ESOPHAGOGASTRODUODENOSCOPY (EGD);  Surgeon: Rogene Houston, MD;  [facility];  Service: Endoscopy;  Laterality: N/A;  . HERNIA REPAIR    . PACEMAKER INSERTION  02/09/2013   STJ Assurity pacemaker implanted by Dr Rayann Heman for symptomatic bradycardia  . PERMANENT PACEMAKER INSERTION N/A 02/09/2013   Procedure: PERMANENT PACEMAKER INSERTION;  Surgeon: Coralyn Mark, MD;  Location: [facility] CATH LAB;  Service: Cardiovascular;  Laterality: N/A;  . TONSILLECTOMY    . VENTRAL HERNIA REPAIR      Current Outpatient Medications  Medication Sig Dispense Refill  . acetaminophen (TYLENOL) 325 MG tablet Take 650 mg by mouth every 6 (six)  hours as needed for pain. For pain    . allopurinol (ZYLOPRIM) 100 MG tablet Take 1 tablet by mouth daily.    Marland Kitchen bismuth subsalicylate (PEPTO BISMOL) 262 MG chewable tablet Chew 262 mg by mouth 4 (four) times daily as needed for indigestion.    . busPIRone (BUSPAR) 5 MG tablet Take 5 mg by mouth 2 (two) times daily.     Marland Kitchen donepezil (ARICEPT) 10 MG tablet Take 10 mg by mouth at bedtime.     . fluticasone (FLONASE) 50 MCG/ACT nasal spray Place 1 spray into both nostrils in the morning and at bedtime. 1 g 1  . levocetirizine (XYZAL) 5 MG tablet Take 5 mg by mouth every evening.     Marland Kitchen levothyroxine (SYNTHROID, LEVOTHROID) 50 MCG tablet Take 50 mcg by mouth every morning.     . memantine (NAMENDA) 5 MG tablet Take 5 mg by mouth 2 (two) times daily.     . nitroGLYCERIN (NITROSTAT) 0.4 MG SL tablet PLACE ONE TABLET UNDER THE TONGUE EVERY 5 MINUTES AS NEEDED FOR CHESTPAIN DO NOT EXCEED 3 IN 24 HOURS (Patient taking differently: Place 0.4 mg under the tongue every 5 (five) minutes as needed for chest pain. ) 25 tablet 3  . omeprazole (PRILOSEC) 20 MG capsule Take 2 capsules (40 mg total) by mouth daily. (Patient taking differently: Take 20 mg by mouth every morning. ) 90 capsule 3   No current facility-administered medications for this visit.    Allergies:   Aleve [naproxen sodium], Ativan [lorazepam], Cephalexin, Contrast media [iodinated diagnostic agents], Famvir [famciclovir], Haldol [haloperidol lactate], Iohexol, Ketek [telithromycin], Penicillins, Shellfish allergy, and Sulfa antibiotics   Social History:  The patient  reports that she quit smoking about 13 years ago. Her smoking use included cigarettes. She started smoking about 66 years ago. She has a 30.00 pack-year smoking history. She has never used smokeless tobacco. She reports that she does not drink alcohol and does not use drugs.   Family History:  The patient's family history includes Angina in her mother; Arthritis in an other family  member; Asthma in an other family member; Heart attack in her mother; Heart disease in an other family member; Lung disease in an other family member.   ROS:  Please see the history of present illness.   All other systems are personally reviewed and negative.    Exam:    Vital Signs:  Ht 5\' 4"  (1.626 m)   Wt 120 lb (54.4 kg)   BMI 20.60 kg/m    Labs/Other Tests and Data Reviewed:    Recent Labs: No results found for requested labs within last 8760 hours.   Wt Readings from Last 3 Encounters:  11/11/19 120 lb (54.4 kg)  05/24/19 125 lb (56.7 kg)  06/02/18 113 lb 15.7 oz (51.7 kg)     Other studies  personally reviewed: Last device remote is reviewed from [facility] PDF dated 11/03/19 which reveals normal device function, no arrhythmias except NS AT   ASSESSMENT & PLAN:    1.  Sinus node dysfunction - she is asymptomatic, s/p PPM insertion 2. PPM - her interrogation from a couple of days ago demonstrated normal device function.  3. Dementia - this appears to be worsening. I encouraged her daughter that if she suddenly gets worse, she could have a UTI. 4. CAD - she has not had angina according to her daughter.\  Follow-up:  12 months Next remote: 3 months  Current medicines are reviewed at length with the patient today.   The patient does not have concerns regarding her medicines.  The following changes were made today:  none  Labs/ tests ordered today include: none No orders of the defined types were placed in this encounter.    Patient Risk:  after full review of this patients clinical status, I feel that they are at moderate risk at this time.  Today, I have spent 15 minutes with the patient with telehealth technology discussing all of the above .    Signed, Cristopher Peru, MD  11/11/2019 12:58 PM     [facility] [address] [place] [address] [phone] (office) [phone] (fax)

## 2019-12-07 DIAGNOSIS — E7849 Other hyperlipidemia: Secondary | ICD-10-CM | POA: Diagnosis not present

## 2019-12-07 DIAGNOSIS — E039 Hypothyroidism, unspecified: Secondary | ICD-10-CM | POA: Diagnosis not present

## 2019-12-07 DIAGNOSIS — M1991 Primary osteoarthritis, unspecified site: Secondary | ICD-10-CM | POA: Diagnosis not present

## 2019-12-07 DIAGNOSIS — I25119 Atherosclerotic heart disease of native coronary artery with unspecified angina pectoris: Secondary | ICD-10-CM | POA: Diagnosis not present

## 2020-01-18 NOTE — Progress Notes (Signed)
Virtual Visit via Telephone Note   This visit type was conducted due to national recommendations for restrictions regarding the COVID-19 Pandemic (e.g. social distancing) in an effort to limit this patient's exposure and mitigate transmission in our community.  Due to her co-morbid illnesses, this patient is at least at moderate risk for complications without adequate follow up.  This format is felt to be most appropriate for this patient at this time.  The patient did not have access to video technology/had technical difficulties with video requiring transitioning to audio format only (telephone).  All issues noted in this document were discussed and addressed.  No physical exam could be performed with this format.  Please refer to the patient's chart for her  consent to telehealth for [facility].    Date:  01/19/2020   ID:  [name], DOB 10/21/1925, MRN [id number] The patient was identified using 2 identifiers.  Patient Location: Home Provider Location: Office/Clinic  PCP:  Sharilyn Sites, MD  Cardiologist:  No primary care provider on file.  Electrophysiologist:  Cristopher Peru, MD   Evaluation Performed:  Follow-Up Visit  Chief Complaint:  Follow up  History of Present Illness:    [name] is a 87 y.o. female with history of CAD status post CABG 2007, permanent pacemaker followed by Dr. Lovena Le, dementia, CVA, hypertension, tobacco abuse.  Patient last saw Dr. Lovena Le 11/11/2019 at which time she was doing well.  Telemedicine visit with daughter Neoma Laming. Patient had covid19 in December. Has lost a lot of weight, and very weak. Under hospice care. Denies chest pain or shortness of breath. Not very responsive anymore. No longer smoking.  The patient does not have symptoms concerning for COVID-19 infection (fever, chills, cough, or new shortness of breath).    Past Medical History:  Diagnosis Date  . Anemia   . Arthritis    "fingers; back" (02/09/2013)  . Asthma   .  CAD (coronary artery disease)    CABG surgery in 06/2005 for left main disease; normal EF; negative stress nuclear 08/2008  . Cerebrovascular disease    1993-CVA; 08/2008-mild atherosclerosis without focal stenosis  . Chronic back pain greater than 3 months duration    "nerve damage down into left leg" (02/09/2013)  . Chronic bronchitis ([place])    "haven't had it in last 2-3 years; used to get it q yr" (02/09/2013)  . COPD (chronic obstructive pulmonary disease) ([place])   . Dementia ([place])    "more than mild; having more short term memory loss recently; on Aricept" (02/09/2013)  . Diverticulosis   . Dizziness    chronic  . Family history of anesthesia complication    "makes daughter sick" (02/09/2013)  . Frequent PVCs   . Frequent UTI   . GERD (gastroesophageal reflux disease)   . SPQZRAQT(622.6)    "sometimes weekly" (02/09/2013)  . High cholesterol   . Hypertension   . Mental disorder    mild dementia  . Orthostatic hypotension   . Osteopenia   . Pneumonia    "couple times; several years ago" (02/09/2013)  . Sinus bradycardia    s/p STJ Assurity dual chamber pacemaker by Dr Rayann Heman 02/2013  . Stroke ([place])    20-30 yrs ago  . Syncope     Negative even recorder dated 3/08  . Tobacco abuse    60 pack years; continuing   Past Surgical History:  Procedure Laterality Date  . ABDOMINAL HYSTERECTOMY  1990  . APPENDECTOMY    . CARDIAC  CATHETERIZATION  2007  . CATARACT EXTRACTION, BILATERAL    . COLONOSCOPY  10/10/2011   Procedure: COLONOSCOPY;  Surgeon: Rogene Houston, MD;  [facility];  Service: Endoscopy;  Laterality: N/A;  1230  . COLONOSCOPY N/A 04/12/2015   Procedure: COLONOSCOPY;  Surgeon: Rogene Houston, MD;  [facility];  Service: Endoscopy;  Laterality: N/A;  . CORONARY ARTERY BYPASS GRAFT  2007   Left main disease  . ESOPHAGOGASTRODUODENOSCOPY  10/10/2011   Procedure: ESOPHAGOGASTRODUODENOSCOPY (EGD);  Surgeon: Rogene Houston, MD;  [facility];   Service: Endoscopy;  Laterality: N/A;  . ESOPHAGOGASTRODUODENOSCOPY N/A 04/08/2015   Procedure: ESOPHAGOGASTRODUODENOSCOPY (EGD);  Surgeon: Rogene Houston, MD;  [facility];  Service: Endoscopy;  Laterality: N/A;  . HERNIA REPAIR    . PACEMAKER INSERTION  02/09/2013   STJ Assurity pacemaker implanted by Dr Rayann Heman for symptomatic bradycardia  . PERMANENT PACEMAKER INSERTION N/A 02/09/2013   Procedure: PERMANENT PACEMAKER INSERTION;  Surgeon: Coralyn Mark, MD;  Location: [facility] CATH LAB;  Service: Cardiovascular;  Laterality: N/A;  . TONSILLECTOMY    . VENTRAL HERNIA REPAIR       Current Meds  Medication Sig  . acetaminophen (TYLENOL) 325 MG tablet Take 650 mg by mouth every 6 (six) hours as needed for pain. For pain  . allopurinol (ZYLOPRIM) 100 MG tablet Take 1 tablet by mouth daily.  . busPIRone (BUSPAR) 5 MG tablet Take 5 mg by mouth 2 (two) times daily.   . ciprofloxacin (CIPRO) 500 MG tablet Take by mouth.  . donepezil (ARICEPT) 10 MG tablet Take 10 mg by mouth at bedtime.  Marland Kitchen levocetirizine (XYZAL) 5 MG tablet Take 5 mg by mouth every evening.   Marland Kitchen levothyroxine (SYNTHROID, LEVOTHROID) 50 MCG tablet Take 50 mcg by mouth every morning.   . memantine (NAMENDA) 5 MG tablet Take 5 mg by mouth 2 (two) times daily.   . nitroGLYCERIN (NITROSTAT) 0.4 MG SL tablet PLACE ONE TABLET UNDER THE TONGUE EVERY 5 MINUTES AS NEEDED FOR CHESTPAIN DO NOT EXCEED 3 IN 24 HOURS (Patient taking differently: Place 0.4 mg under the tongue every 5 (five) minutes as needed for chest pain.)  . omeprazole (PRILOSEC) 20 MG capsule Take 2 capsules (40 mg total) by mouth daily. (Patient taking differently: Take 20 mg by mouth every morning.)     Allergies:   Aleve [naproxen sodium], Ativan [lorazepam], Cephalexin, Contrast media [iodinated diagnostic agents], Famvir [famciclovir], Haldol [haloperidol lactate], Iohexol, Ketek [telithromycin], Penicillins, Shellfish allergy, and Sulfa antibiotics   Social History    Tobacco Use  . Smoking status: Former Smoker    Packs/day: 0.50    Years: 60.00    Pack years: 30.00    Types: Cigarettes    Start date: 12/12/1952    Quit date: 10/20/2006    Years since quitting: 13.2  . Smokeless tobacco: Never Used  . Tobacco comment: 02/09/2013 "quit smoking in ~ 2009"  Vaping Use  . Vaping Use: Never used  Substance Use Topics  . Alcohol use: No    Alcohol/week: 0.0 standard drinks  . Drug use: No     Family Hx: The patient's family history includes Angina in her mother; Arthritis in an other family member; Asthma in an other family member; Heart attack in her mother; Heart disease in an other family member; Lung disease in an other family member.  ROS:   Please see the history of present illness.      All other  systems reviewed and are negative.   Prior CV studies:   The following studies were reviewed today:  2D echo 05/27/2018 IMPRESSIONS     1. The left ventricle has hyperdynamic systolic function, with an  ejection fraction of >65%. The cavity size was normal. There is mildly  increased left ventricular wall thickness. Left ventricular diastolic  Doppler parameters are consistent with  impaired relaxation. No evidence of left ventricular regional wall motion  abnormalities.   2. The right ventricle has normal systolic function. The cavity was  normal. There is no increase in right ventricular wall thickness. Right  ventricular systolic pressure is mildly elevated with an estimated  pressure of 37.8 mmHg. Device wire present.   3. Left atrial size was mildly dilated.   4. Right atrial size was mildly dilated.   5. The aortic valve is tricuspid. Aortic valve regurgitation is mild by  color flow Doppler. Mild aortic annular calcification noted.   6. The mitral valve is grossly normal. Mild thickening of the mitral  valve leaflet.   7. The tricuspid valve is grossly normal. There is mild tricuspid  regurgitation.   8. The aortic root is  normal in size and structure.    Labs/Other Tests and Data Reviewed:    EKG:  No ECG reviewed.  Recent Labs: No results found for requested labs within last 8760 hours.   Recent Lipid Panel Lab Results  Component Value Date/Time   CHOL 144 09/20/2013 10:42 AM   TRIG 50 09/20/2013 10:42 AM   HDL 84 09/20/2013 10:42 AM   CHOLHDL 1.7 09/20/2013 10:42 AM   LDLCALC 50 09/20/2013 10:42 AM    Wt Readings from Last 3 Encounters:  11/11/19 120 lb (54.4 kg)  05/24/19 125 lb (56.7 kg)  06/02/18 113 lb 15.7 oz (51.7 kg)     Risk Assessment/Calculations:      Objective:    Vital Signs:  BP (!) 110/96   Pulse 78   Ht 5\' 4"  (1.626 m)   BMI 20.60 kg/m    VITAL SIGNS:  reviewed  ASSESSMENT & PLAN:    CAD status post CABG in 2007 05/2018 normal LVEF greater than 65%-no angina, now under hospice care, not taking meds regularly-won't swollen them anymore.  Permanent pacemaker followed by Dr. Lovena Le, last histogram 10/2019 stable. Pulse running around 90-100. Asymptomatic. Try to get her synthroid in her as she is having trouble swallowing. No changes.  Hypertension BP runs up at times but can't get her to swallow meds anymore so will not treat. Under hospice  History of CVA        COVID-19 Education: The signs and symptoms of COVID-19 were discussed with the patient and how to seek care for testing (follow up with PCP or arrange E-visit).  The importance of social distancing was discussed today.  Time:   Today, I have spent 11 minutes with the patient with telehealth technology discussing the above problems.     Medication Adjustments/Labs and Tests Ordered: Current medicines are reviewed at length with the patient today.  Concerns regarding medicines are outlined above.   Tests Ordered: No orders of the defined types were placed in this encounter.   Medication Changes: No orders of the defined types were placed in this encounter.   Follow Up:  Virtual Visit  in 10  month(s) Dr. Lovena Le  Signed, Ermalinda Barrios, PA-C  01/19/2020 12:50 PM    [place]

## 2020-01-19 ENCOUNTER — Telehealth (INDEPENDENT_AMBULATORY_CARE_PROVIDER_SITE_OTHER): Payer: Medicare Other | Admitting: Physician Assistant

## 2020-01-19 ENCOUNTER — Encounter: Payer: Self-pay | Admitting: Physician Assistant

## 2020-01-19 ENCOUNTER — Other Ambulatory Visit: Payer: Self-pay

## 2020-01-19 VITALS — BP 110/96 | HR 78 | Ht 64.0 in

## 2020-01-19 DIAGNOSIS — I25810 Atherosclerosis of coronary artery bypass graft(s) without angina pectoris: Secondary | ICD-10-CM | POA: Diagnosis not present

## 2020-01-19 DIAGNOSIS — I10 Essential (primary) hypertension: Secondary | ICD-10-CM

## 2020-01-19 DIAGNOSIS — Z8673 Personal history of transient ischemic attack (TIA), and cerebral infarction without residual deficits: Secondary | ICD-10-CM | POA: Diagnosis not present

## 2020-01-19 DIAGNOSIS — I495 Sick sinus syndrome: Secondary | ICD-10-CM | POA: Diagnosis not present

## 2020-01-19 NOTE — Patient Instructions (Addendum)
Medication Instructions:  Your physician recommends that you continue on your current medications as directed. Please refer to the Current Medication list given to you today. *If you need a refill on your cardiac medications before your next appointment, please call your pharmacy*   Lab Work: None If you have labs (blood work) drawn today and your tests are completely normal, you will receive your results only by: Marland Kitchen MyChart Message (if you have MyChart) OR . A paper copy in the mail If you have any lab test that is abnormal or we need to change your treatment, we will call you to review the results.   Testing/Procedures: None   Follow-Up: At [facility], you and your health needs are our priority.  As part of our continuing mission to provide you with exceptional heart care, we have created designated Provider Care Teams.  These Care Teams include your primary Cardiologist (physician) and Advanced Practice Providers (APPs -  Physician Assistants and Nurse Practitioners) who all work together to provide you with the care you need, when you need it.  We recommend signing up for the patient portal called "MyChart".  Sign up information is provided on this After Visit Summary.  MyChart is used to connect with patients for Virtual Visits (Telemedicine).  Patients are able to view lab/test results, encounter notes, upcoming appointments, etc.  Non-urgent messages can be sent to your provider as well.   To learn more about what you can do with MyChart, go to [email or web address].    Your next appointment:   In November 2022  The format for your next appointment:   Virtual  Provider:   Cristopher Peru, MD

## 2020-02-01 ENCOUNTER — Ambulatory Visit (INDEPENDENT_AMBULATORY_CARE_PROVIDER_SITE_OTHER): Payer: Medicare Other

## 2020-02-01 DIAGNOSIS — I495 Sick sinus syndrome: Secondary | ICD-10-CM

## 2020-02-01 LAB — CUP PACEART REMOTE DEVICE CHECK
Battery Remaining Longevity: 125 mo
Battery Remaining Percentage: 95.5 %
Battery Voltage: 2.98 V
Brady Statistic AP VP Percent: 1 %
Brady Statistic AP VS Percent: 43 %
Brady Statistic AS VP Percent: 1 %
Brady Statistic AS VS Percent: 55 %
Brady Statistic RA Percent Paced: 39 %
Brady Statistic RV Percent Paced: 1 %
Date Time Interrogation Session: 20220125020013
Implantable Lead Implant Date: 20150203
Implantable Lead Implant Date: 20150203
Implantable Lead Location: 753859
Implantable Lead Location: 753860
Implantable Lead Model: 1948
Implantable Pulse Generator Implant Date: 20150203
Lead Channel Impedance Value: 450 Ohm
Lead Channel Impedance Value: 580 Ohm
Lead Channel Pacing Threshold Amplitude: 0.75 V
Lead Channel Pacing Threshold Amplitude: 1 V
Lead Channel Pacing Threshold Pulse Width: 0.4 ms
Lead Channel Pacing Threshold Pulse Width: 0.4 ms
Lead Channel Sensing Intrinsic Amplitude: 2.6 mV
Lead Channel Sensing Intrinsic Amplitude: 9.3 mV
Lead Channel Setting Pacing Amplitude: 2 V
Lead Channel Setting Pacing Amplitude: 2.5 V
Lead Channel Setting Pacing Pulse Width: 0.4 ms
Lead Channel Setting Sensing Sensitivity: 2 mV
Pulse Gen Model: 2240
Pulse Gen Serial Number: 7586568

## 2020-02-14 NOTE — Progress Notes (Signed)
Remote pacemaker transmission.   

## 2020-03-07 DEATH — deceased
